# Patient Record
Sex: Male | Born: 1950 | Race: White | Hispanic: Yes | Marital: Married | Smoking: Current some day smoker
Health system: Southern US, Community
[De-identification: ages and names within clinical notes are randomized; demographics above are authoritative.]

## PROBLEM LIST (undated history)

## (undated) DIAGNOSIS — T783XXA Angioneurotic edema, initial encounter: Secondary | ICD-10-CM

## (undated) DIAGNOSIS — B029 Zoster without complications: Secondary | ICD-10-CM

## (undated) DIAGNOSIS — E119 Type 2 diabetes mellitus without complications: Secondary | ICD-10-CM

## (undated) DIAGNOSIS — Z9884 Bariatric surgery status: Secondary | ICD-10-CM

## (undated) DIAGNOSIS — I10 Essential (primary) hypertension: Secondary | ICD-10-CM

## (undated) DIAGNOSIS — R569 Unspecified convulsions: Secondary | ICD-10-CM

## (undated) DIAGNOSIS — N200 Calculus of kidney: Secondary | ICD-10-CM

## (undated) DIAGNOSIS — N481 Balanitis: Secondary | ICD-10-CM

## (undated) DIAGNOSIS — T7840XA Allergy, unspecified, initial encounter: Secondary | ICD-10-CM

## (undated) DIAGNOSIS — E785 Hyperlipidemia, unspecified: Secondary | ICD-10-CM

## (undated) DIAGNOSIS — K649 Unspecified hemorrhoids: Secondary | ICD-10-CM

## (undated) DIAGNOSIS — M1990 Unspecified osteoarthritis, unspecified site: Secondary | ICD-10-CM

## (undated) DIAGNOSIS — M353 Polymyalgia rheumatica: Secondary | ICD-10-CM

## (undated) DIAGNOSIS — G4733 Obstructive sleep apnea (adult) (pediatric): Secondary | ICD-10-CM

## (undated) HISTORY — DX: Essential (primary) hypertension: I10

## (undated) HISTORY — DX: Bariatric surgery status: Z98.84

## (undated) HISTORY — DX: Balanitis: N48.1

## (undated) HISTORY — DX: Unspecified hemorrhoids: K64.9

## (undated) HISTORY — DX: Allergy, unspecified, initial encounter: T78.40XA

## (undated) HISTORY — DX: Unspecified osteoarthritis, unspecified site: M19.90

## (undated) HISTORY — PX: APPENDECTOMY: SHX54

## (undated) HISTORY — DX: Polymyalgia rheumatica: M35.3

## (undated) HISTORY — DX: Zoster without complications: B02.9

## (undated) HISTORY — DX: Unspecified convulsions: R56.9

## (undated) HISTORY — DX: Calculus of kidney: N20.0

## (undated) HISTORY — DX: Hyperlipidemia, unspecified: E78.5

## (undated) HISTORY — DX: Type 2 diabetes mellitus without complications: E11.9

## (undated) HISTORY — DX: Angioneurotic edema, initial encounter: T78.3XXA

## (undated) HISTORY — DX: Obstructive sleep apnea (adult) (pediatric): G47.33

---
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
## 2001-07-18 ENCOUNTER — Encounter: Admission: RE | Admit: 2001-07-18 | Discharge: 2001-10-16 | Payer: Self-pay | Admitting: Internal Medicine

## 2001-10-19 ENCOUNTER — Ambulatory Visit (HOSPITAL_BASED_OUTPATIENT_CLINIC_OR_DEPARTMENT_OTHER): Admission: RE | Admit: 2001-10-19 | Discharge: 2001-10-19 | Payer: Self-pay | Admitting: Internal Medicine

## 2002-02-05 ENCOUNTER — Ambulatory Visit (HOSPITAL_COMMUNITY): Admission: RE | Admit: 2002-02-05 | Discharge: 2002-02-05 | Payer: Self-pay | Admitting: Gastroenterology

## 2004-01-29 IMAGING — CR DG CHEST 2V
2 series · 2 of 2 positions shown · non-contrast
Comparison: none

CLINICAL DATA: Obstructive sleep apnea.  Smoking history two cigars per day for 20 years with hypertension.  Preadmission.
 PA AND LATERAL CHEST:
 No old studies are available for comparison.
 The patient has made a poor inspiratory effort and taking this into consideration heart and mediastinal contours are within normal limits.  The lung fields are clear with no evidence for focal infiltrate or congestive failure.  Bony structures appear intact.
 IMPRESSION
 No acute cardiopulmonary disease.

[view not recorded (1 of 2)]
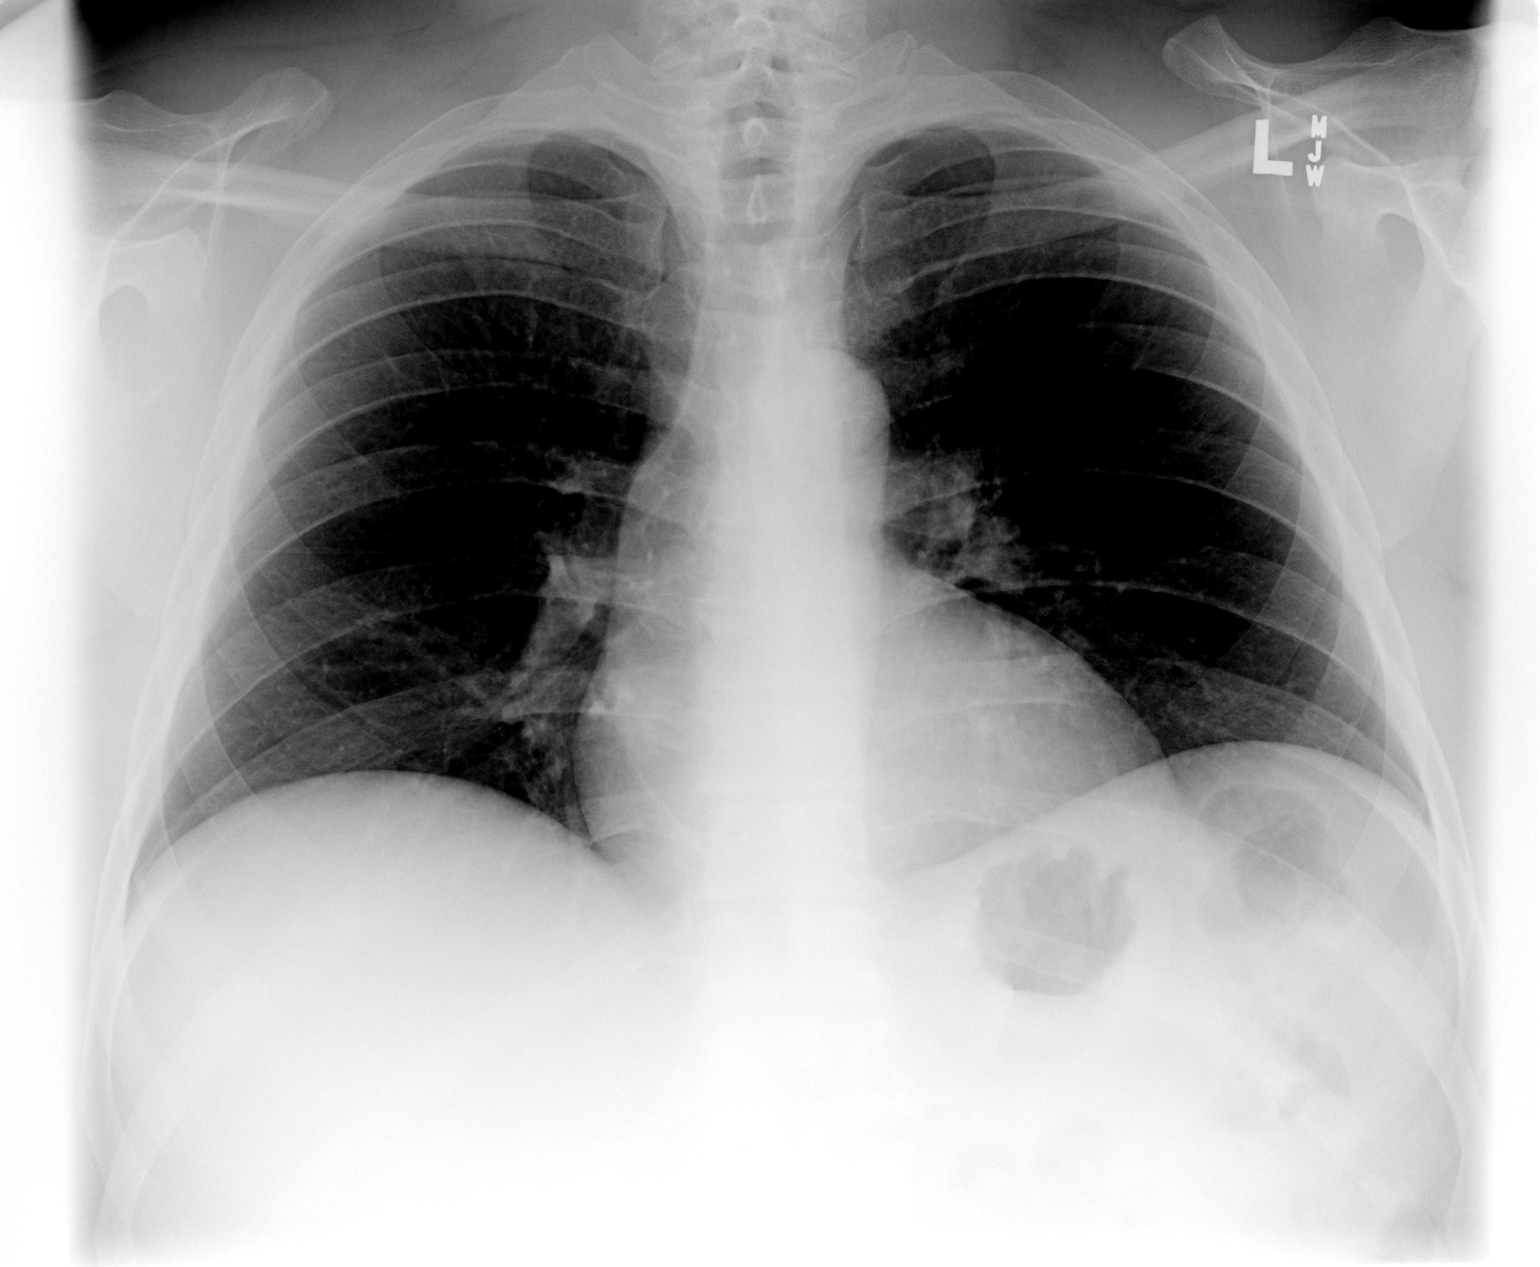

[view not recorded (2 of 2)]
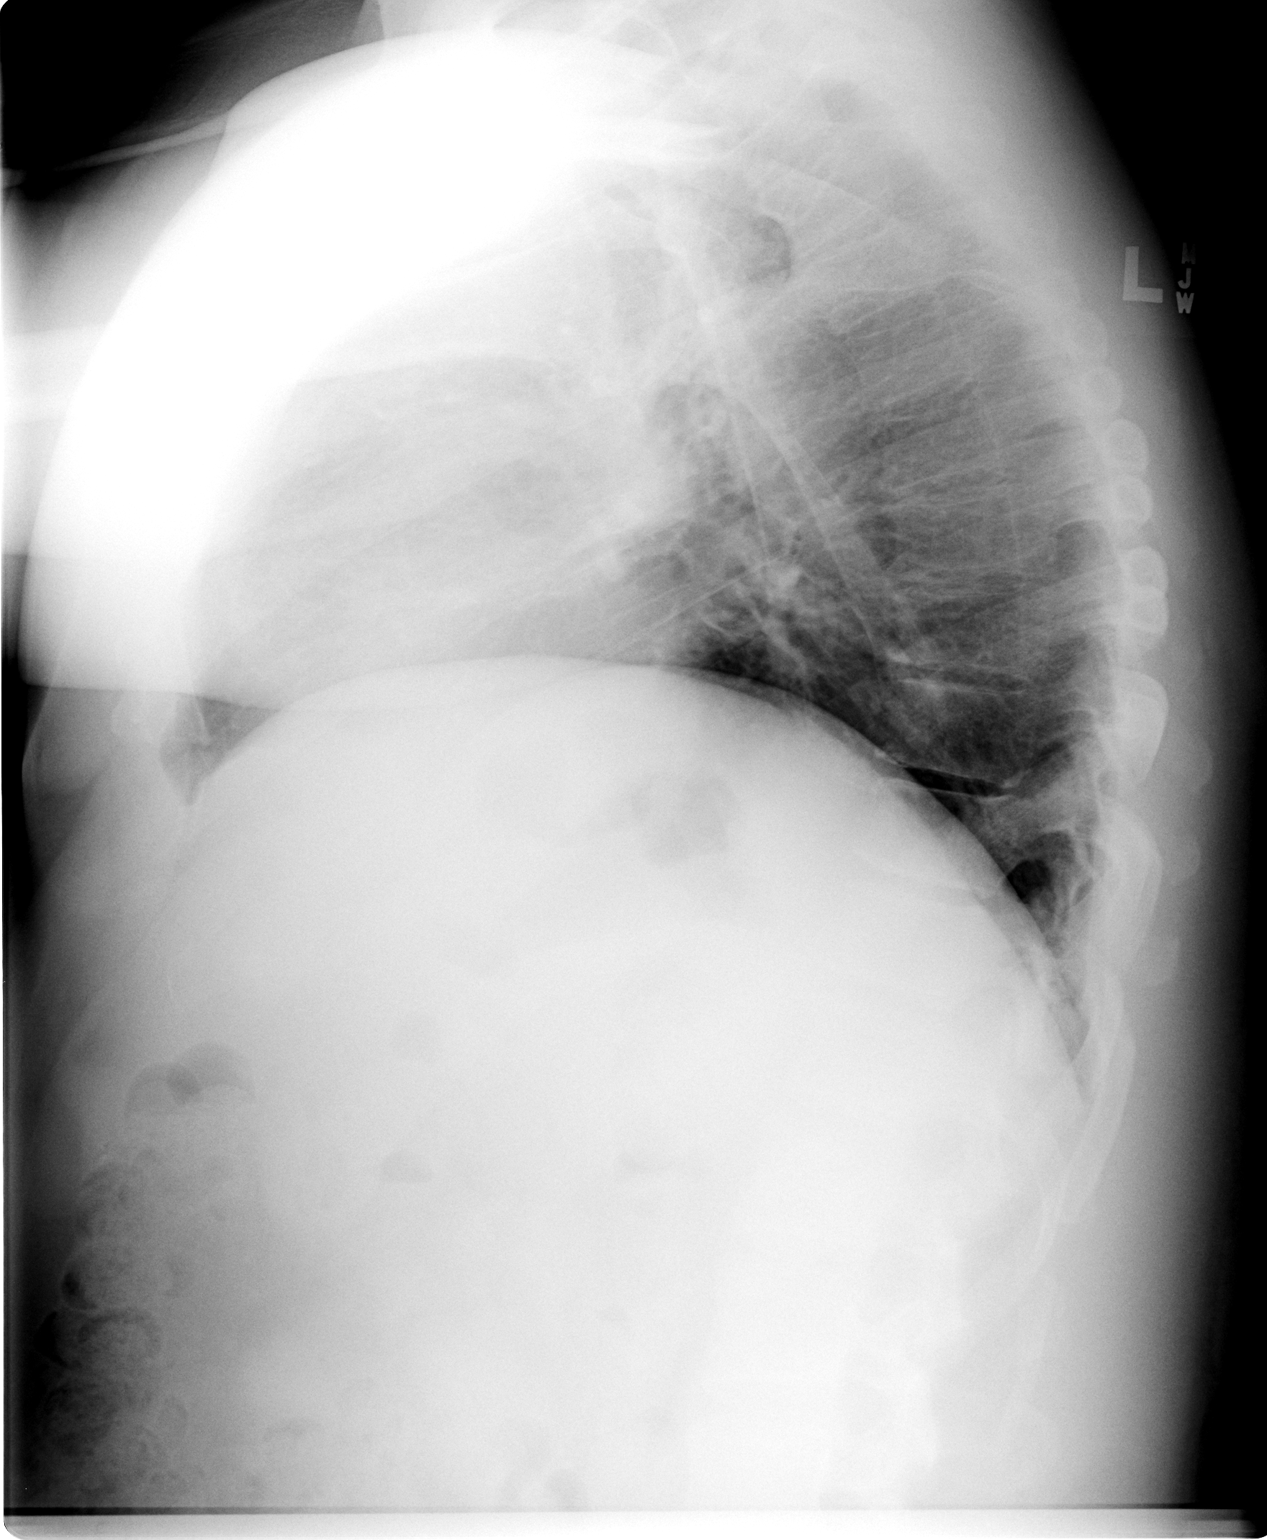

[2 of 2 positions shown; findings below may reference images not displayed]

## 2004-02-04 ENCOUNTER — Ambulatory Visit (HOSPITAL_COMMUNITY): Admission: RE | Admit: 2004-02-04 | Discharge: 2004-02-05 | Payer: Self-pay | Admitting: Otolaryngology

## 2004-08-11 ENCOUNTER — Ambulatory Visit: Payer: Self-pay | Admitting: Internal Medicine

## 2004-09-13 ENCOUNTER — Encounter: Admission: RE | Admit: 2004-09-13 | Discharge: 2004-12-12 | Payer: Self-pay | Admitting: Internal Medicine

## 2004-10-28 ENCOUNTER — Ambulatory Visit: Payer: Self-pay | Admitting: Family Medicine

## 2004-11-08 ENCOUNTER — Ambulatory Visit: Payer: Self-pay | Admitting: Internal Medicine

## 2005-06-21 ENCOUNTER — Ambulatory Visit: Payer: Self-pay | Admitting: Internal Medicine

## 2005-06-27 ENCOUNTER — Ambulatory Visit: Payer: Self-pay | Admitting: Internal Medicine

## 2005-07-01 ENCOUNTER — Ambulatory Visit: Payer: Self-pay

## 2006-06-19 ENCOUNTER — Encounter: Payer: Self-pay | Admitting: Internal Medicine

## 2006-06-28 ENCOUNTER — Ambulatory Visit: Payer: Self-pay | Admitting: Internal Medicine

## 2006-06-28 LAB — CONVERTED CEMR LAB: Hgb A1c MFr Bld: 7.4 % — ABNORMAL HIGH (ref 4.6–6.0)

## 2006-08-08 ENCOUNTER — Ambulatory Visit: Payer: Self-pay | Admitting: Internal Medicine

## 2006-08-21 ENCOUNTER — Encounter: Payer: Self-pay | Admitting: Internal Medicine

## 2006-09-26 ENCOUNTER — Ambulatory Visit: Payer: Self-pay | Admitting: Internal Medicine

## 2006-10-02 ENCOUNTER — Ambulatory Visit: Payer: Self-pay | Admitting: Internal Medicine

## 2006-10-25 ENCOUNTER — Encounter: Admission: RE | Admit: 2006-10-25 | Discharge: 2006-10-25 | Payer: Self-pay | Admitting: Internal Medicine

## 2006-11-15 ENCOUNTER — Ambulatory Visit: Payer: Self-pay | Admitting: Internal Medicine

## 2006-11-20 DIAGNOSIS — I152 Hypertension secondary to endocrine disorders: Secondary | ICD-10-CM | POA: Insufficient documentation

## 2006-11-20 DIAGNOSIS — E785 Hyperlipidemia, unspecified: Secondary | ICD-10-CM | POA: Insufficient documentation

## 2006-11-20 DIAGNOSIS — E119 Type 2 diabetes mellitus without complications: Secondary | ICD-10-CM | POA: Insufficient documentation

## 2006-11-20 DIAGNOSIS — E1159 Type 2 diabetes mellitus with other circulatory complications: Secondary | ICD-10-CM

## 2006-11-20 DIAGNOSIS — G4730 Sleep apnea, unspecified: Secondary | ICD-10-CM | POA: Insufficient documentation

## 2006-11-20 DIAGNOSIS — I10 Essential (primary) hypertension: Secondary | ICD-10-CM

## 2007-01-31 ENCOUNTER — Ambulatory Visit: Payer: Self-pay | Admitting: Internal Medicine

## 2007-02-20 ENCOUNTER — Encounter: Payer: Self-pay | Admitting: Internal Medicine

## 2007-02-27 ENCOUNTER — Ambulatory Visit: Payer: Self-pay | Admitting: Internal Medicine

## 2007-02-27 IMAGING — CR DG CHEST 2V
3 series · 3 of 3 positions shown · non-contrast
Comparison: none

CLINICAL DATA: Cough

Chest 2 view:
Comparison [DATE]. Relatively low lung volumes as before with subsegmental
atelectasis or conceivably infiltrate in the posterior right lower lobe. No
effusion. Heart size normal.

[view not recorded (1 of 3)]
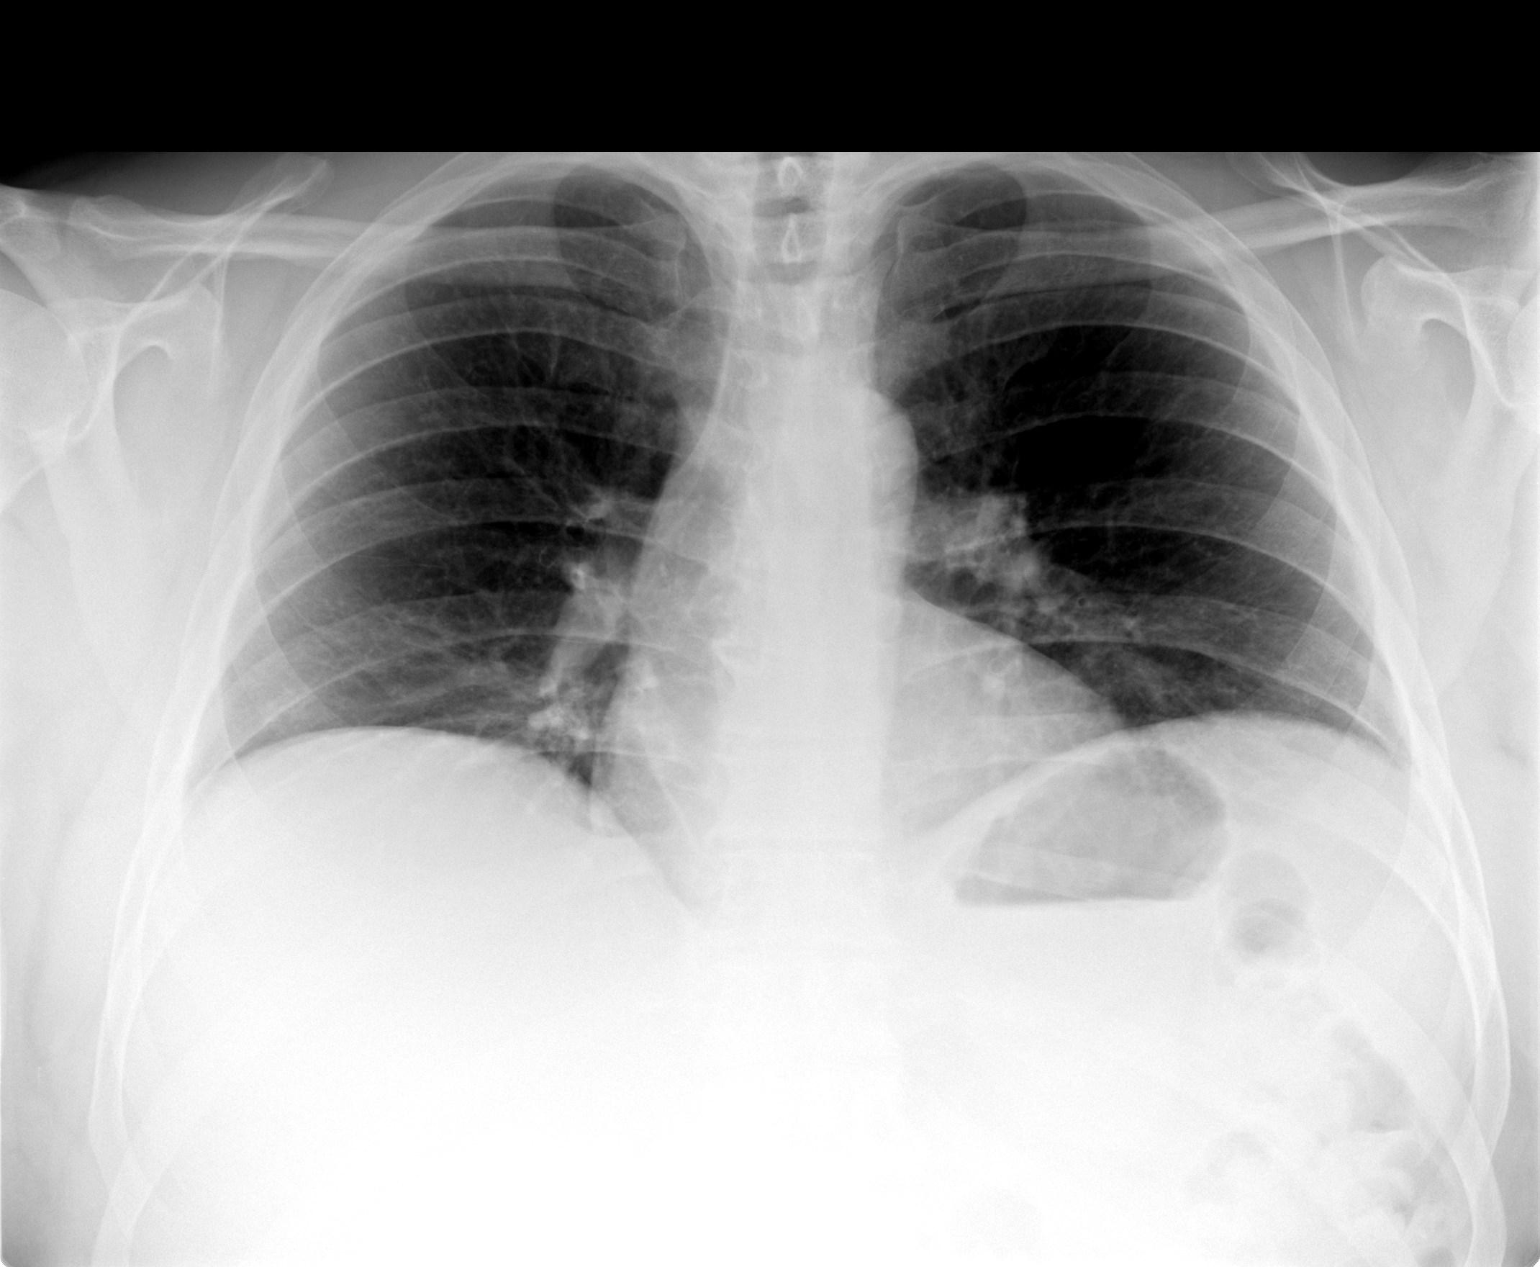

[view not recorded (2 of 3)]
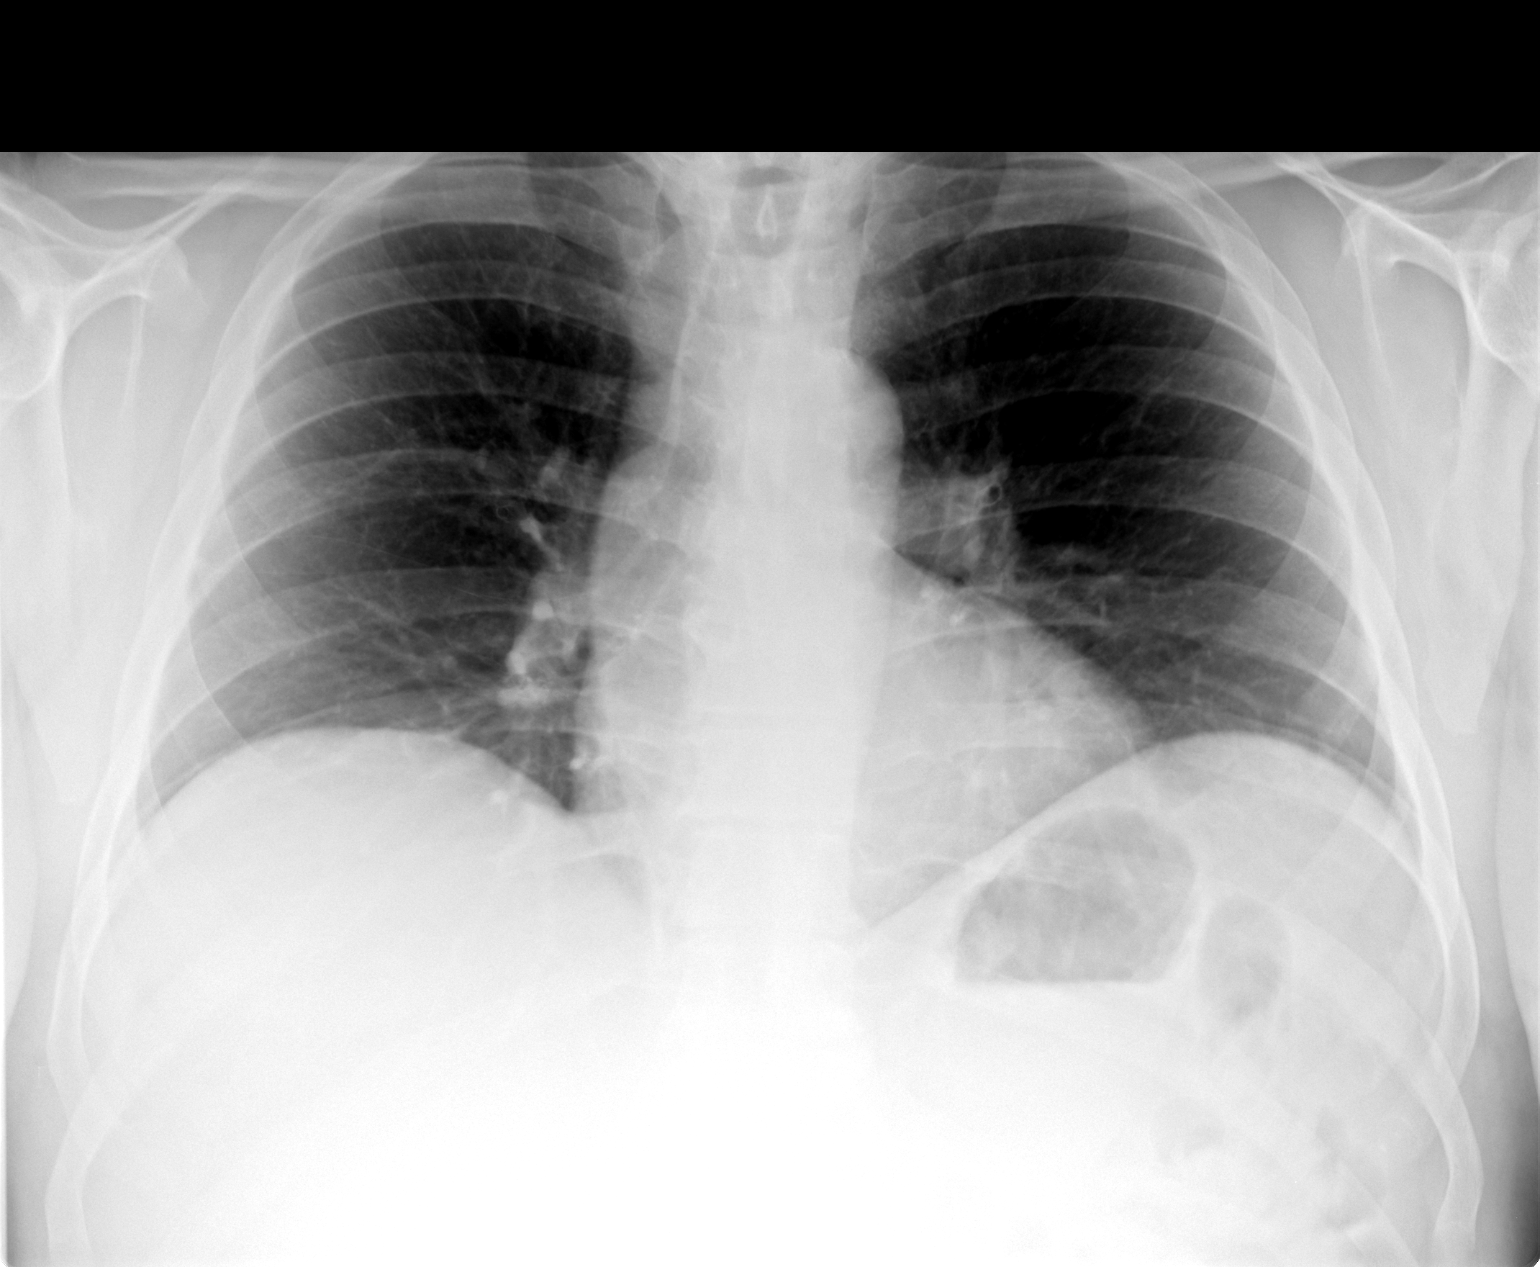

[view not recorded (3 of 3)]
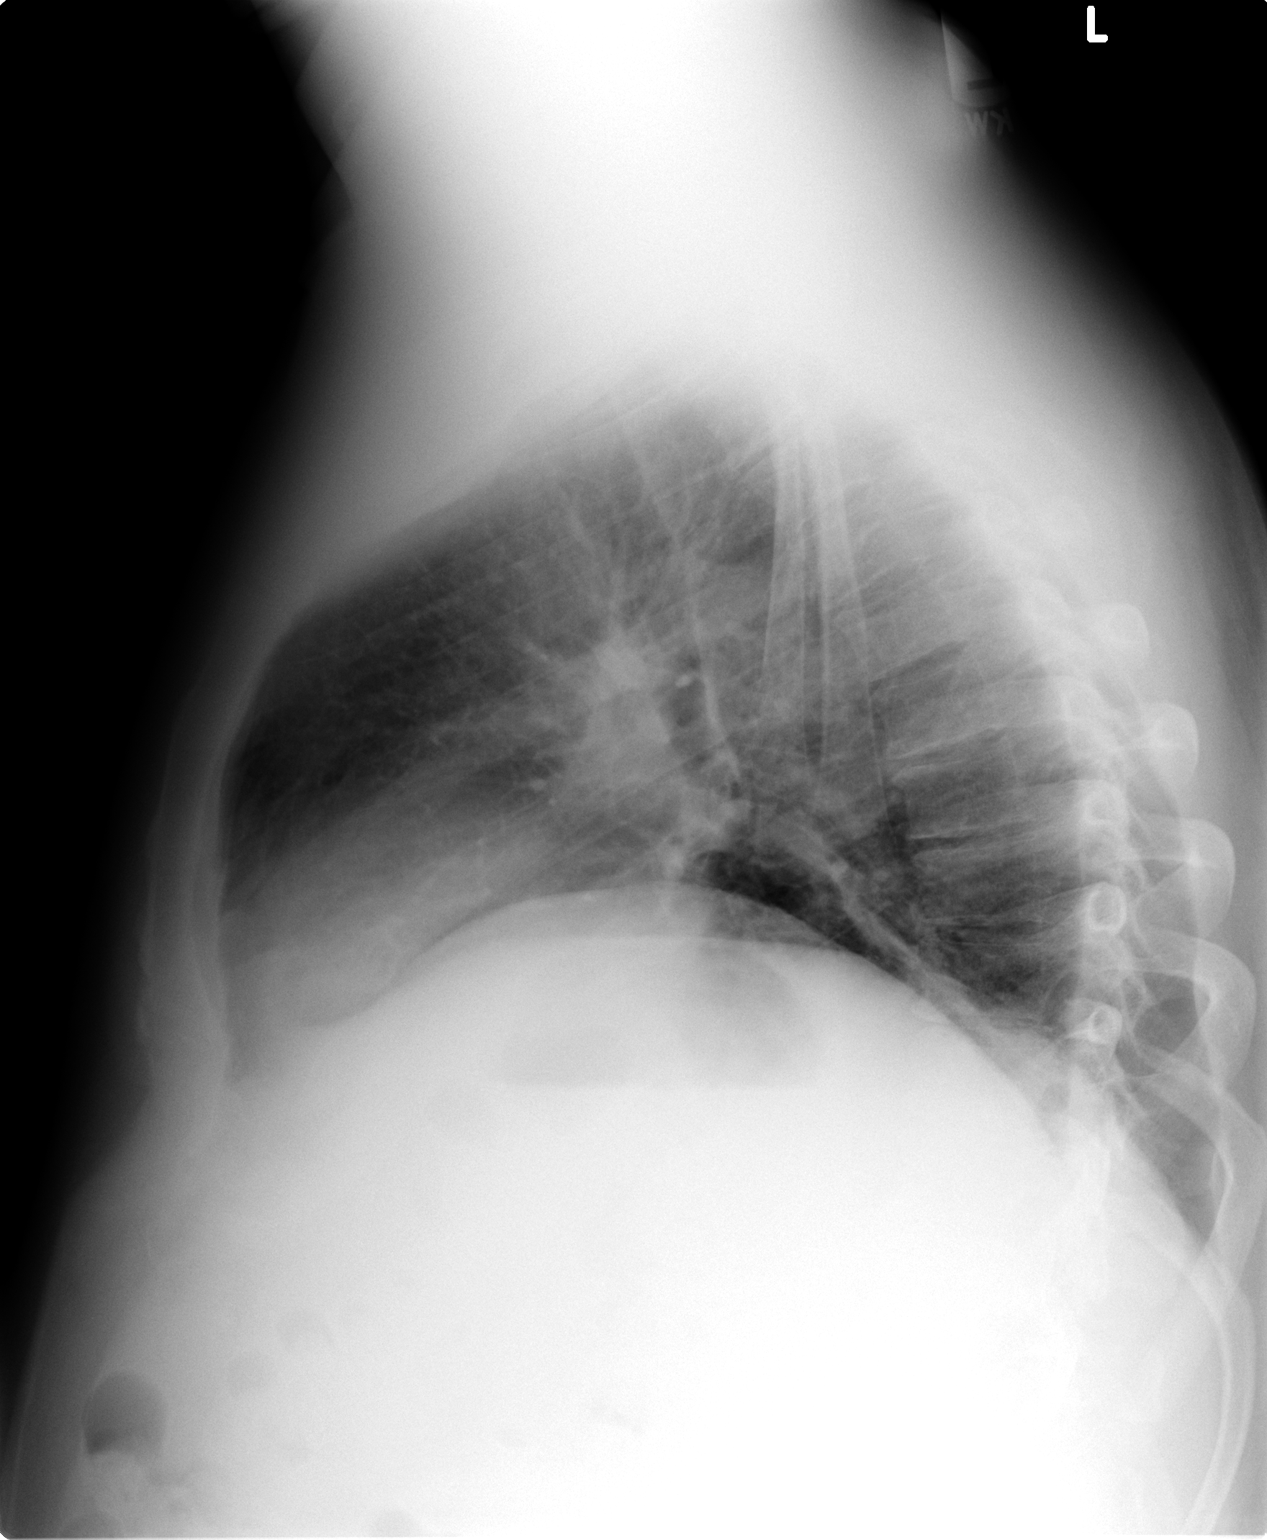

[3 of 3 positions shown; findings below may reference images not displayed]

IMPRESSION: 1. Low volumes with posterior right lower lobe infiltrate or atelectasis.

## 2007-04-03 ENCOUNTER — Encounter: Payer: Self-pay | Admitting: Internal Medicine

## 2007-06-11 ENCOUNTER — Ambulatory Visit: Payer: Self-pay | Admitting: Internal Medicine

## 2007-06-11 DIAGNOSIS — E669 Obesity, unspecified: Secondary | ICD-10-CM | POA: Insufficient documentation

## 2007-06-11 LAB — CONVERTED CEMR LAB
Albumin: 4.2 g/dL (ref 3.5–5.2)
Bilirubin, Direct: 0.1 mg/dL (ref 0.0–0.3)
Blood Glucose, Fingerstick: 198
CO2: 30 meq/L (ref 19–32)
Cholesterol, target level: 200 mg/dL
Creatinine,U: 139.4 mg/dL
HDL: 32.4 mg/dL — ABNORMAL LOW (ref >39.0)
LDL Goal: 100 mg/dL
Microalb Creat Ratio: 15.1 mg/g (ref 0.0–30.0)
Microalb, Ur: 2.1 mg/dL — ABNORMAL HIGH (ref 0.0–1.9)
Potassium: 4.7 meq/L (ref 3.5–5.1)
Sodium: 139 meq/L (ref 135–145)
Total Bilirubin: 0.8 mg/dL (ref 0.3–1.2)
Total Protein: 6.1 g/dL (ref 6.0–8.3)
VLDL: 24 mg/dL (ref 0–40)

## 2007-07-16 ENCOUNTER — Telehealth (INDEPENDENT_AMBULATORY_CARE_PROVIDER_SITE_OTHER): Payer: Self-pay | Admitting: *Deleted

## 2007-07-17 ENCOUNTER — Ambulatory Visit: Payer: Self-pay | Admitting: Internal Medicine

## 2007-07-18 ENCOUNTER — Telehealth (INDEPENDENT_AMBULATORY_CARE_PROVIDER_SITE_OTHER): Payer: Self-pay | Admitting: *Deleted

## 2007-12-20 ENCOUNTER — Ambulatory Visit: Payer: Self-pay | Admitting: Internal Medicine

## 2008-02-08 ENCOUNTER — Telehealth: Payer: Self-pay | Admitting: Internal Medicine

## 2008-02-29 ENCOUNTER — Ambulatory Visit: Payer: Self-pay | Admitting: Internal Medicine

## 2008-02-29 LAB — CONVERTED CEMR LAB
AST: 29 units/L (ref 0–37)
Bilirubin, Direct: 0.3 mg/dL (ref 0.0–0.3)
CO2: 32 meq/L (ref 19–32)
Calcium: 9.3 mg/dL (ref 8.4–10.5)
Chloride: 105 meq/L (ref 96–112)
Glucose, Bld: 159 mg/dL — ABNORMAL HIGH (ref 70–99)
Hgb A1c MFr Bld: 7 % — ABNORMAL HIGH (ref 4.6–6.0)
Potassium: 4.4 meq/L (ref 3.5–5.1)
Total CHOL/HDL Ratio: 3.5
Total Protein: 6.3 g/dL (ref 6.0–8.3)
Triglycerides: 79 mg/dL (ref 0–149)

## 2008-04-30 ENCOUNTER — Ambulatory Visit: Payer: Self-pay | Admitting: Internal Medicine

## 2008-06-09 ENCOUNTER — Telehealth (INDEPENDENT_AMBULATORY_CARE_PROVIDER_SITE_OTHER): Payer: Self-pay | Admitting: *Deleted

## 2008-06-19 ENCOUNTER — Encounter: Payer: Self-pay | Admitting: Internal Medicine

## 2008-07-08 ENCOUNTER — Encounter: Payer: Self-pay | Admitting: Internal Medicine

## 2008-07-15 ENCOUNTER — Ambulatory Visit: Payer: Self-pay | Admitting: Internal Medicine

## 2008-07-15 LAB — CONVERTED CEMR LAB
AST: 21 units/L (ref 0–37)
Albumin: 4.1 g/dL (ref 3.5–5.2)
BUN: 15 mg/dL (ref 6–23)
Bilirubin, Direct: 0.1 mg/dL (ref 0.0–0.3)
Chloride: 104 meq/L (ref 96–112)
Cholesterol: 135 mg/dL (ref 0–200)
GFR calc non Af Amer: 107 mL/min
Glucose, Bld: 156 mg/dL — ABNORMAL HIGH (ref 70–99)
Potassium: 4.5 meq/L (ref 3.5–5.1)
Sodium: 144 meq/L (ref 135–145)
Total Protein: 6.2 g/dL (ref 6.0–8.3)

## 2008-07-29 ENCOUNTER — Ambulatory Visit: Payer: Self-pay | Admitting: Internal Medicine

## 2008-08-25 ENCOUNTER — Telehealth (INDEPENDENT_AMBULATORY_CARE_PROVIDER_SITE_OTHER): Payer: Self-pay | Admitting: *Deleted

## 2008-09-24 ENCOUNTER — Encounter: Payer: Self-pay | Admitting: Internal Medicine

## 2008-09-30 ENCOUNTER — Encounter: Payer: Self-pay | Admitting: Internal Medicine

## 2008-10-09 ENCOUNTER — Encounter: Payer: Self-pay | Admitting: Internal Medicine

## 2008-11-05 ENCOUNTER — Ambulatory Visit (HOSPITAL_COMMUNITY): Admission: RE | Admit: 2008-11-05 | Discharge: 2008-11-05 | Payer: Self-pay | Admitting: Surgery

## 2008-11-05 IMAGING — CR DG UGI W/ KUB
2 series · 2 of 2 positions shown · non-contrast
Comparison: none

CLINICAL DATA: Morbid obesity.  Preop evaluation for bariatric
surgery.

UPPER GI SERIES WITH KUB
TECHNIQUE: After obtaining a scout radiograph a single-column
upper GI series was performed using thin barium.
Fluoroscopy time: 1.8 minutes

[view not recorded (1 of 2)]
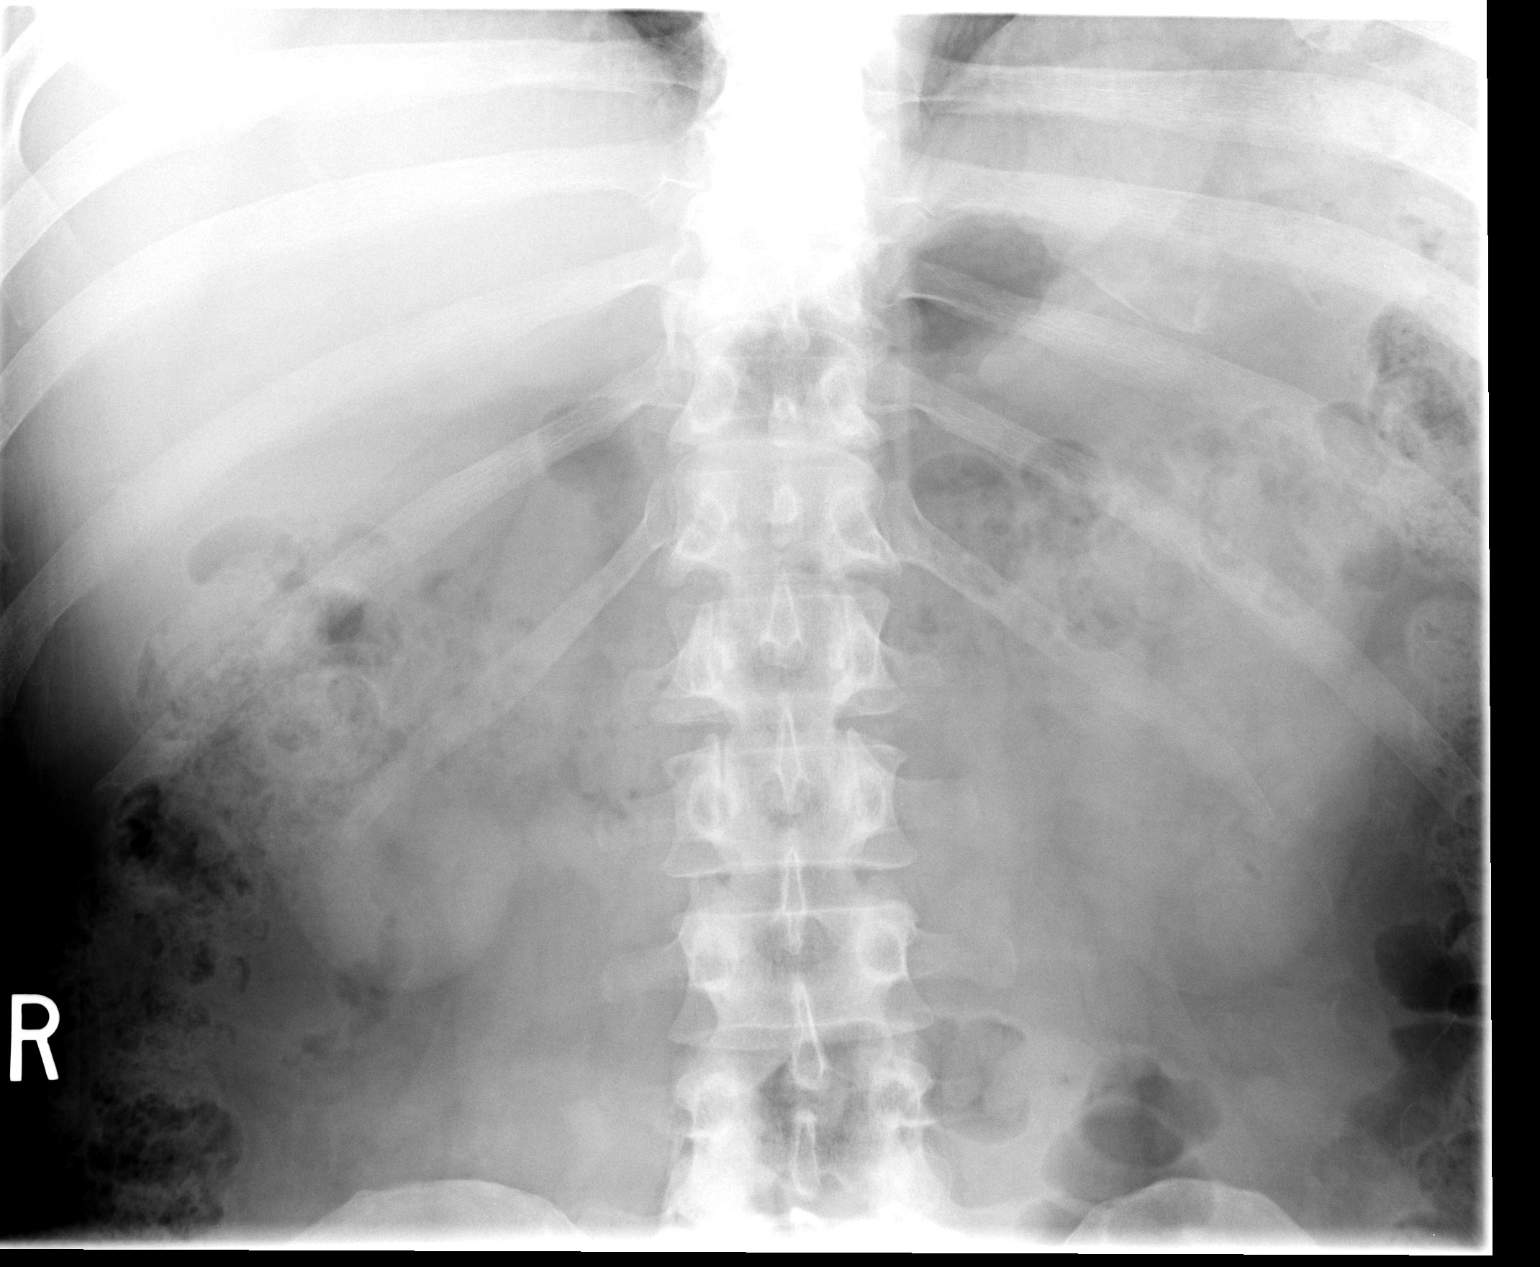

[view not recorded (2 of 2)]
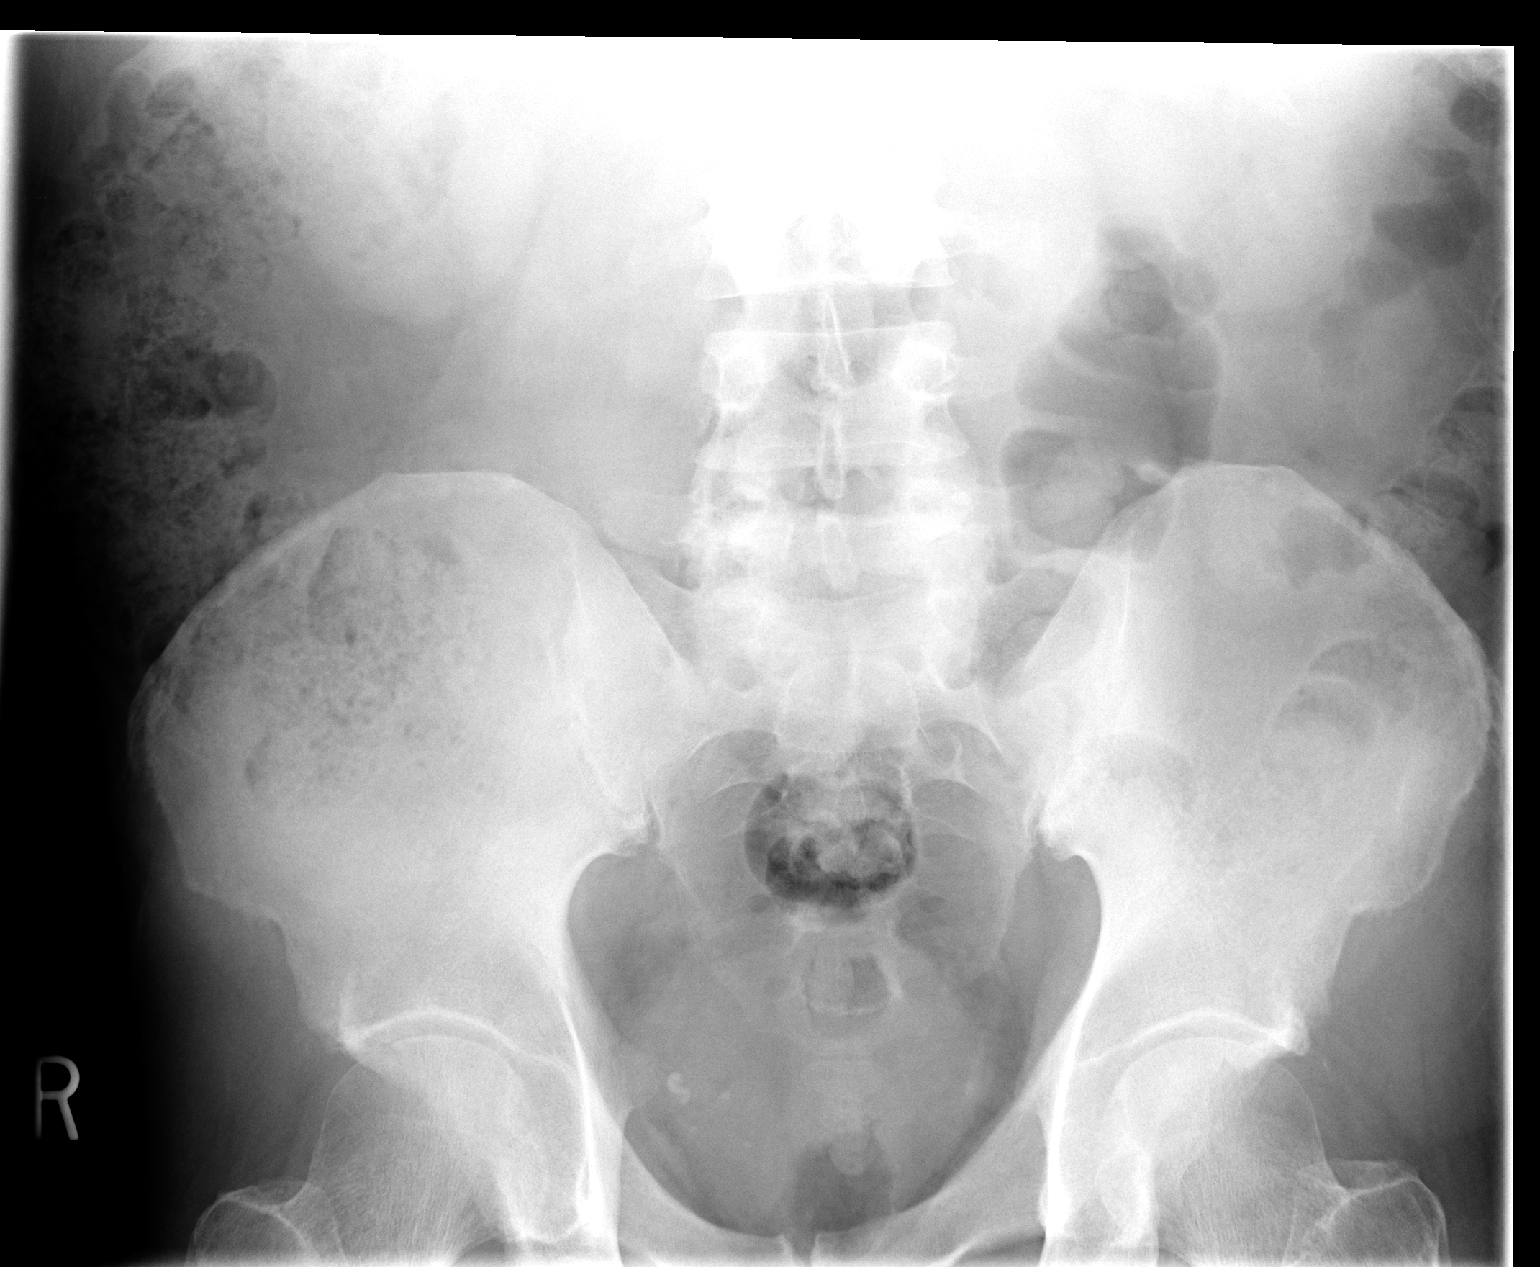

[2 of 2 positions shown; findings below may reference images not displayed]

FINDINGS: The scout radiograph shows a normal bowel gas pattern.

There is no evidence of esophageal mass or stricture.  There is no
evidence of hiatal hernia, and no gastroesophageal reflux was seen
during the exam.  Esophageal motility is within normal limits.

The stomach is normal in appearance.  There is no evidence of
gastric masses or ulcers.  Duodenal bulb and sweep are normal in
appearance.
IMPRESSION: Negative UGI series.

## 2008-11-05 IMAGING — RF DG UGI W/ KUB
16 series · 16 of 16 positions shown · non-contrast
Comparison: none

CLINICAL DATA: Morbid obesity.  Preop evaluation for bariatric
surgery.

UPPER GI SERIES WITH KUB
TECHNIQUE: After obtaining a scout radiograph a single-column
upper GI series was performed using thin barium.
Fluoroscopy time: 1.8 minutes

[Series 1: run · 1 of 1 slices shown (1 of 16)]
[im 1/1]
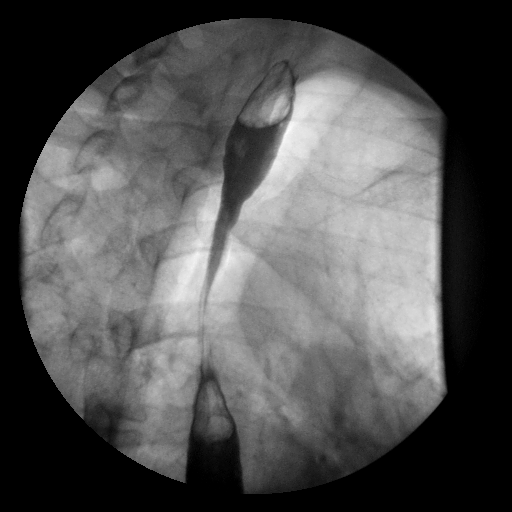

[Series 2: run · 1 of 1 slices shown (2 of 16)]
[im 1/1]
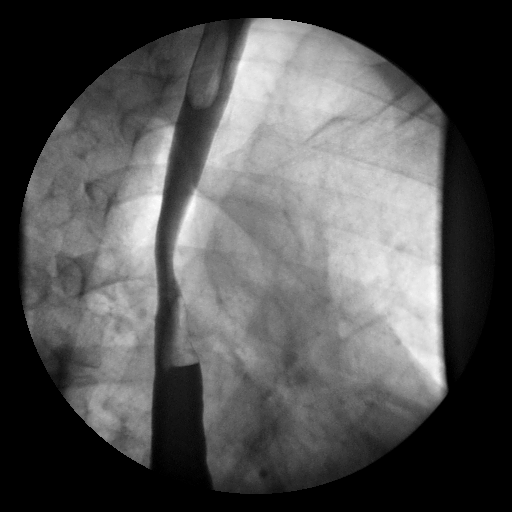

[Series 3: run · 1 of 1 slices shown (3 of 16)]
[im 1/1]
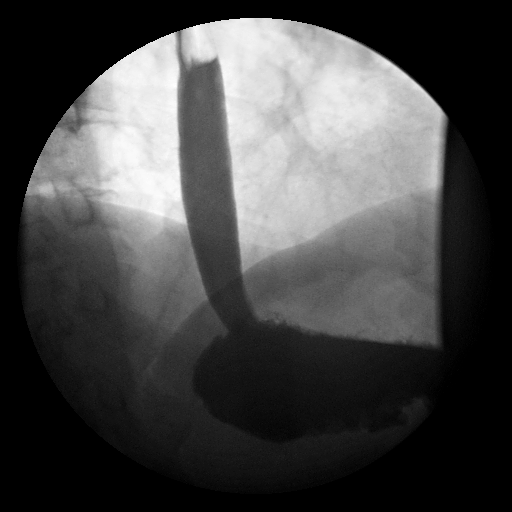

[Series 4: run · 1 of 1 slices shown (4 of 16)]
[im 1/1]
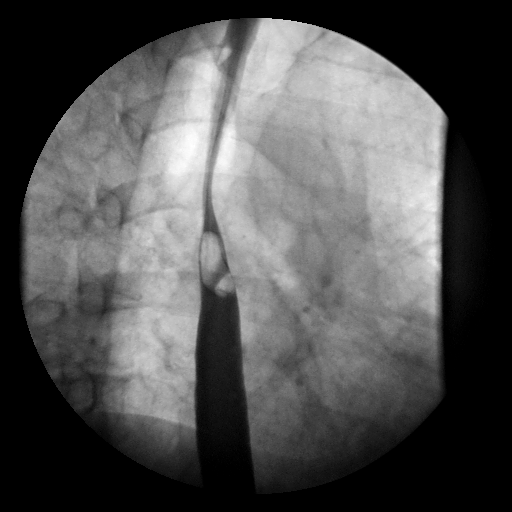

[Series 5: run · 1 of 1 slices shown (5 of 16)]
[im 1/1]
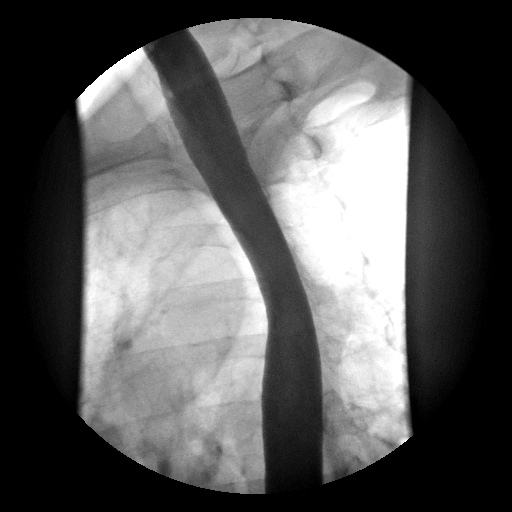

[Series 6: run · 1 of 1 slices shown (6 of 16)]
[im 1/1]
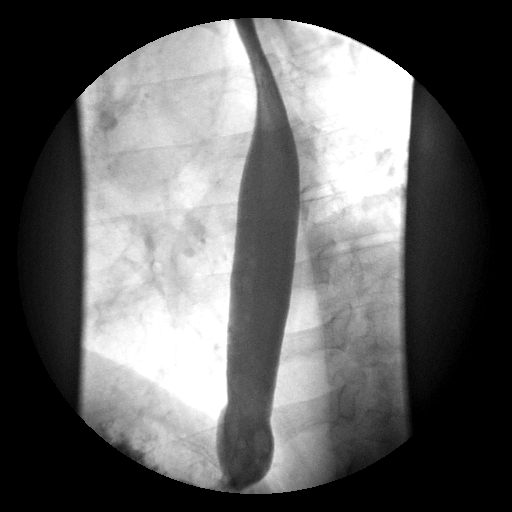

[Series 7: run · 1 of 1 slices shown (7 of 16)]
[im 1/1]
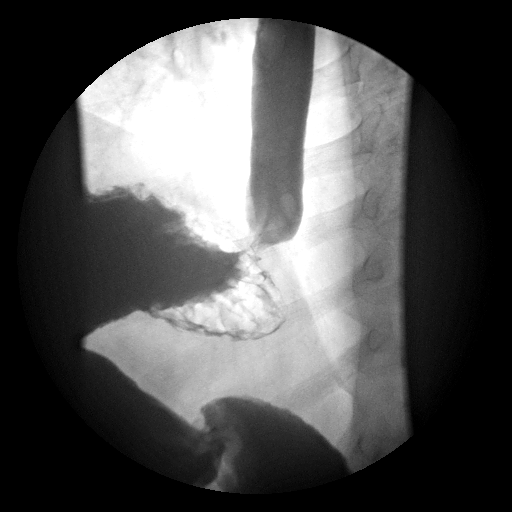

[Series 8: run · 1 of 1 slices shown (8 of 16)]
[im 1/1]
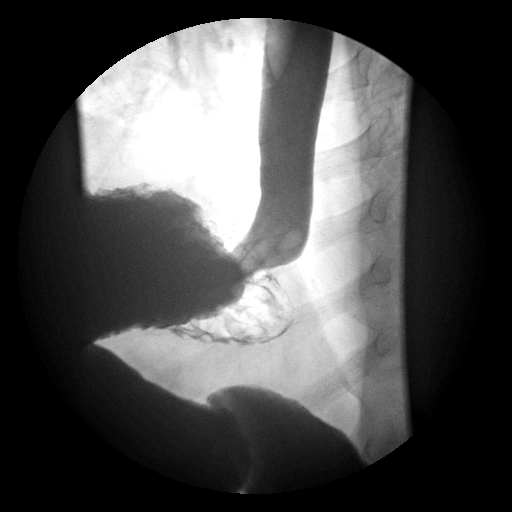

[Series 9: run · 1 of 1 slices shown (9 of 16)]
[im 1/1]
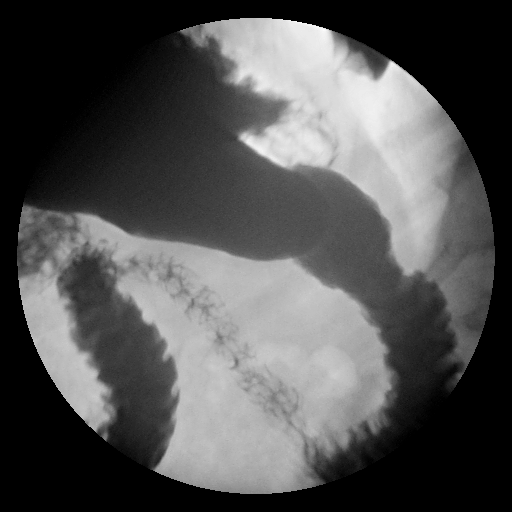

[Series 10: run · 1 of 1 slices shown (10 of 16)]
[im 1/1]
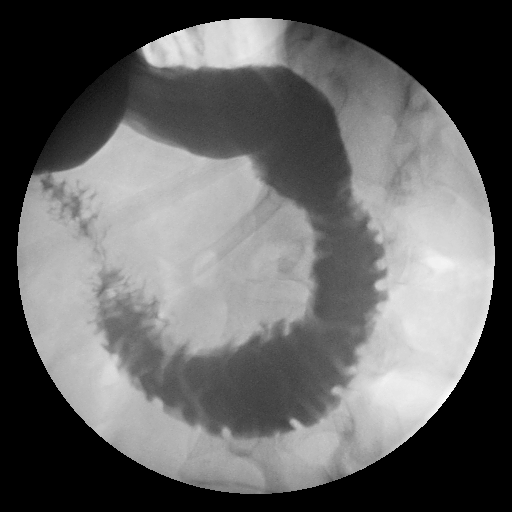

[Series 11: run · 1 of 1 slices shown (11 of 16)]
[im 1/1]
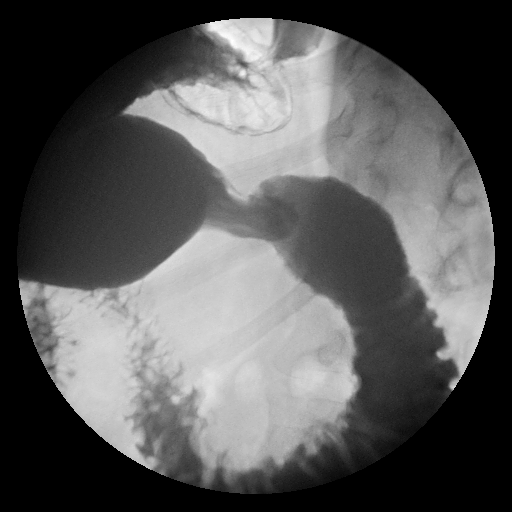

[Series 12: run · 1 of 1 slices shown (12 of 16)]
[im 1/1]
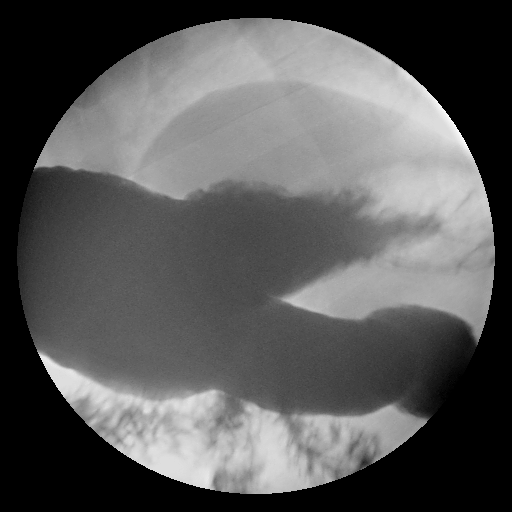

[Series 13: run · 1 of 1 slices shown (13 of 16)]
[im 1/1]
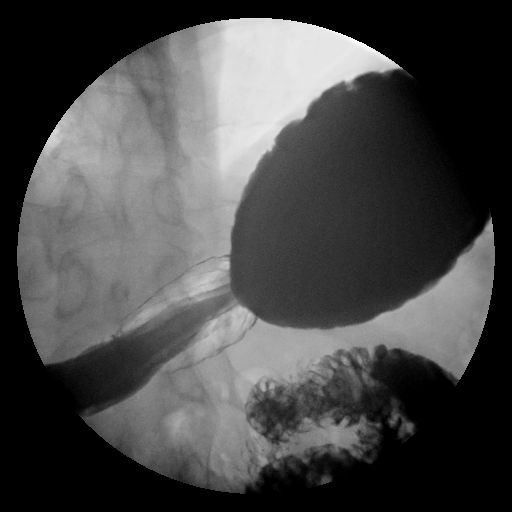

[Series 14: run · 1 of 1 slices shown (14 of 16)]
[im 1/1]
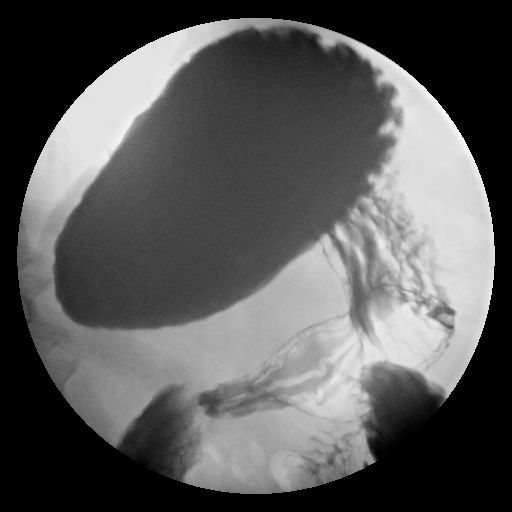

[Series 15: run · 1 of 1 slices shown (15 of 16)]
[im 1/1]
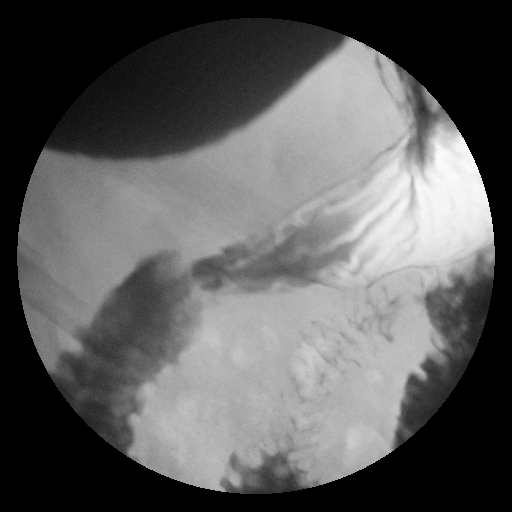

[Series 16: run · 1 of 1 slices shown (16 of 16)]
[im 1/1]
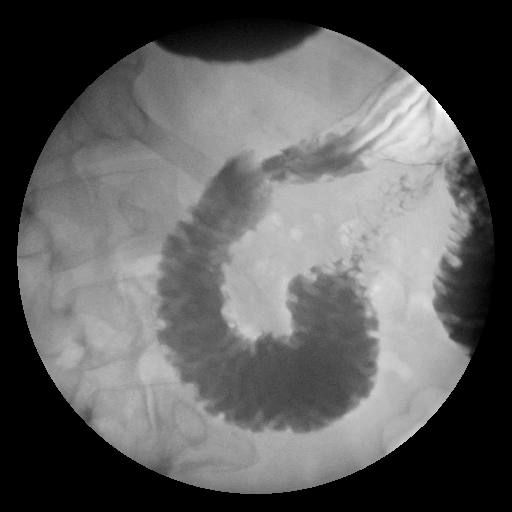

[16 of 16 positions shown; findings below may reference images not displayed]

FINDINGS: The scout radiograph shows a normal bowel gas pattern.

There is no evidence of esophageal mass or stricture.  There is no
evidence of hiatal hernia, and no gastroesophageal reflux was seen
during the exam.  Esophageal motility is within normal limits.

The stomach is normal in appearance.  There is no evidence of
gastric masses or ulcers.  Duodenal bulb and sweep are normal in
appearance.
IMPRESSION: Negative UGI series.

## 2008-11-05 IMAGING — US US ABDOMEN COMPLETE
1 series · 14 of 25 positions shown · non-contrast
Comparison: None

CLINICAL DATA: Morbid obesity.  Preop evaluation for bariatric
surgery.

ABDOMINAL ULTRASOUND COMPLETE

[Series 1: us abdomen complete · 14 of 68 slices shown]
[im 1/68]
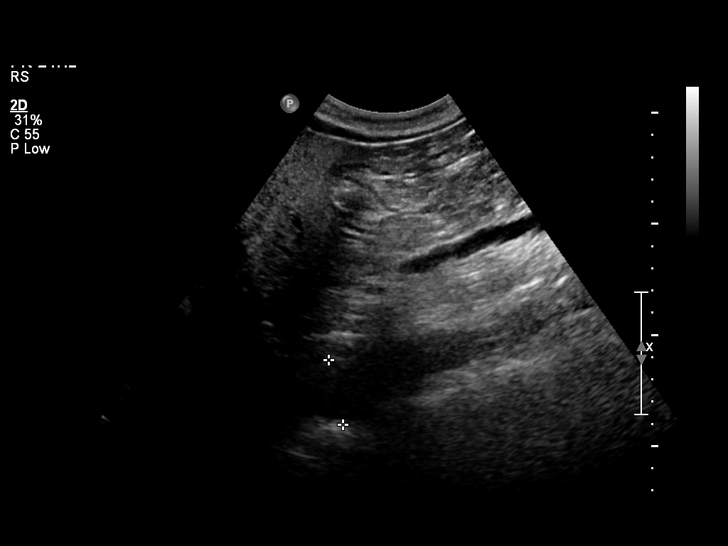
[im 6/68]
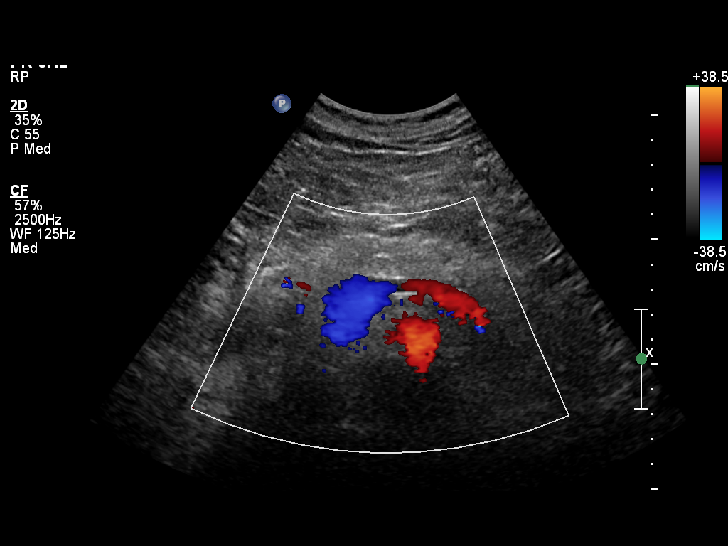
[im 12/68]
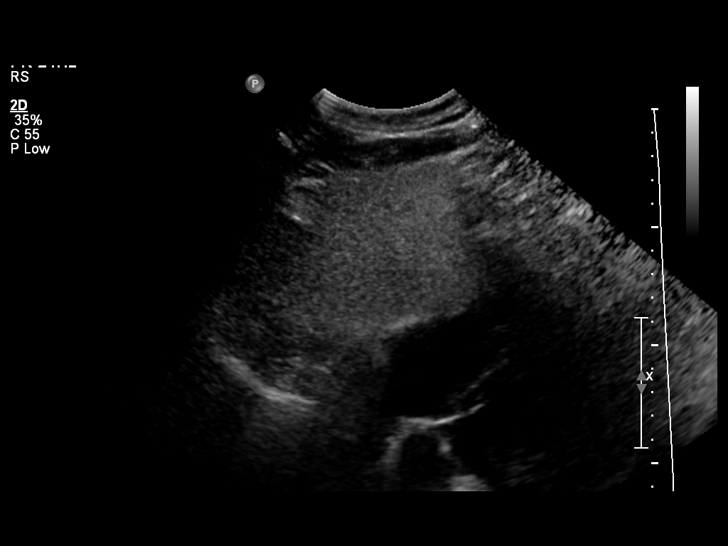
[im 17/68]
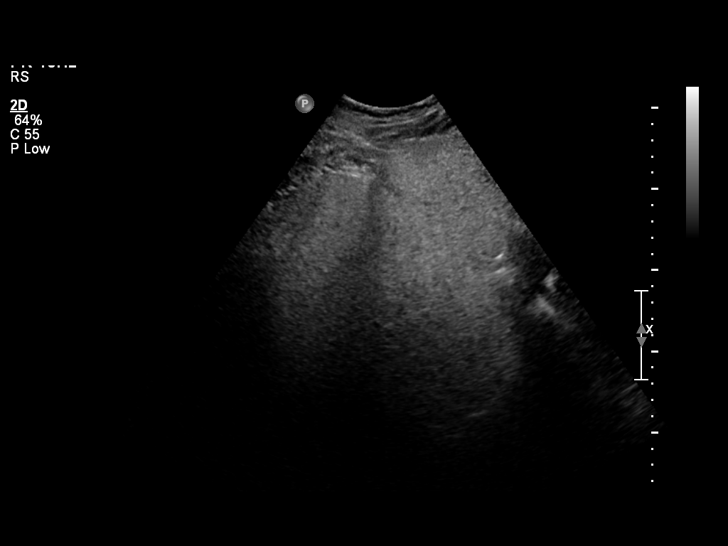
[im 23/68]
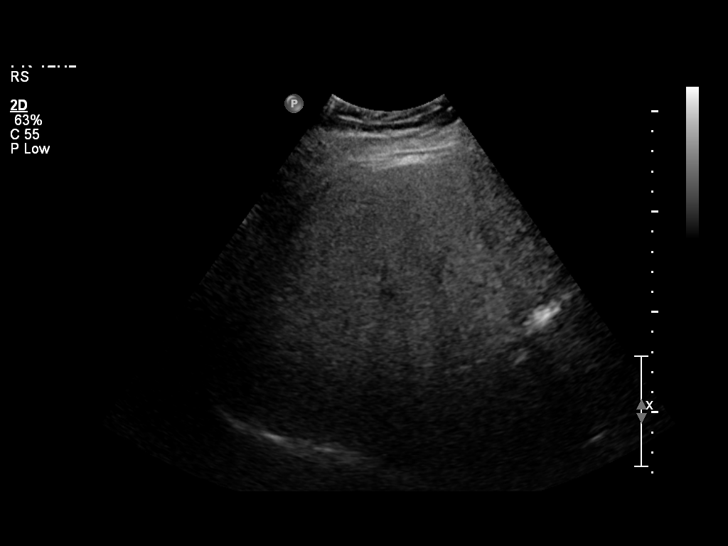
[im 26/68]
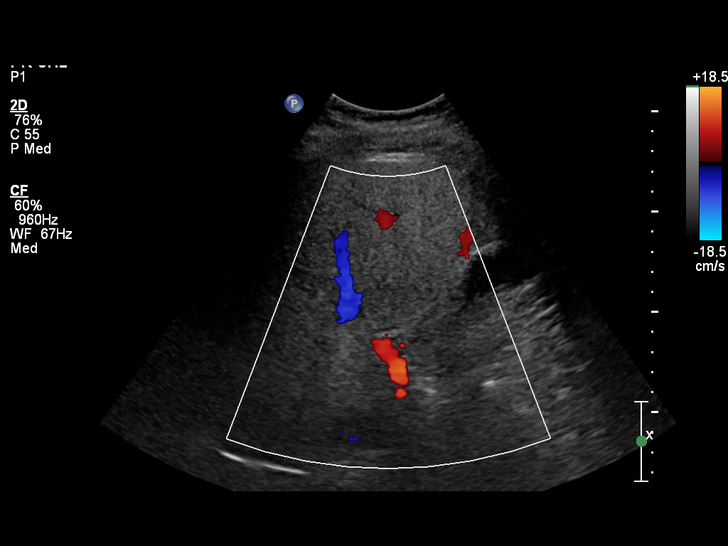
[im 31/68]
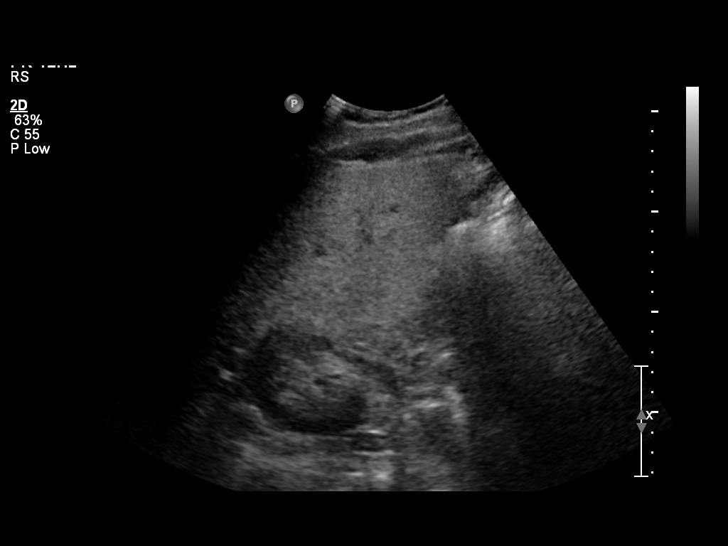
[im 37/68]
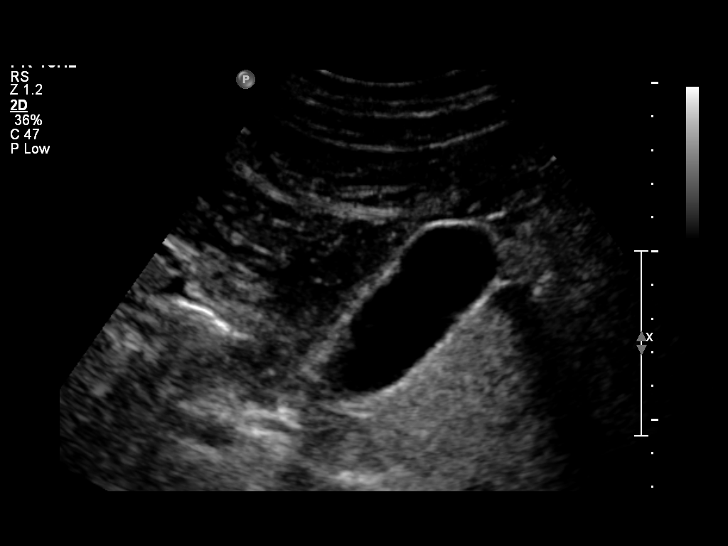
[im 42/68]
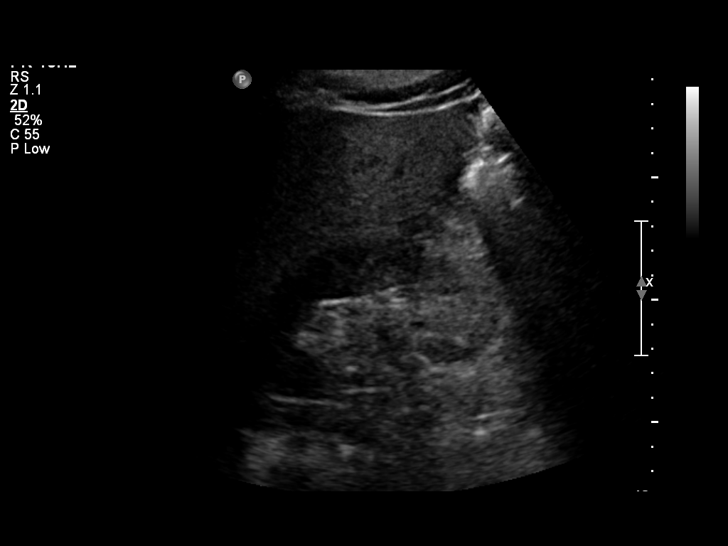
[im 45/68]
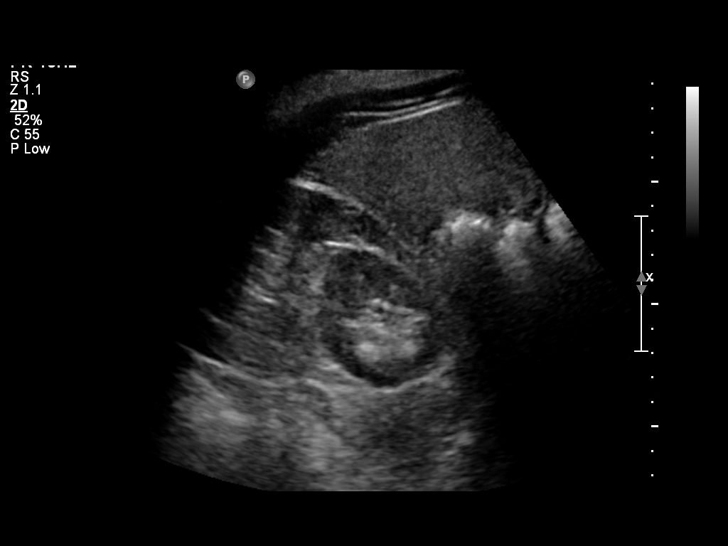
[im 51/68]
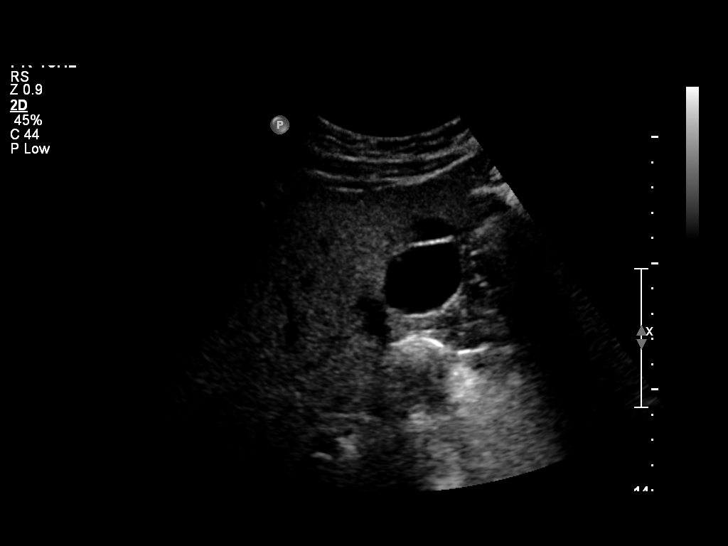
[im 56/68]
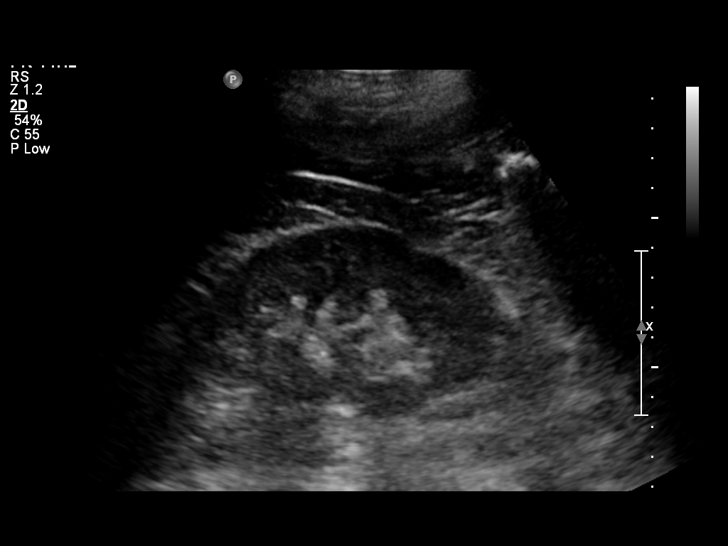
[im 62/68]
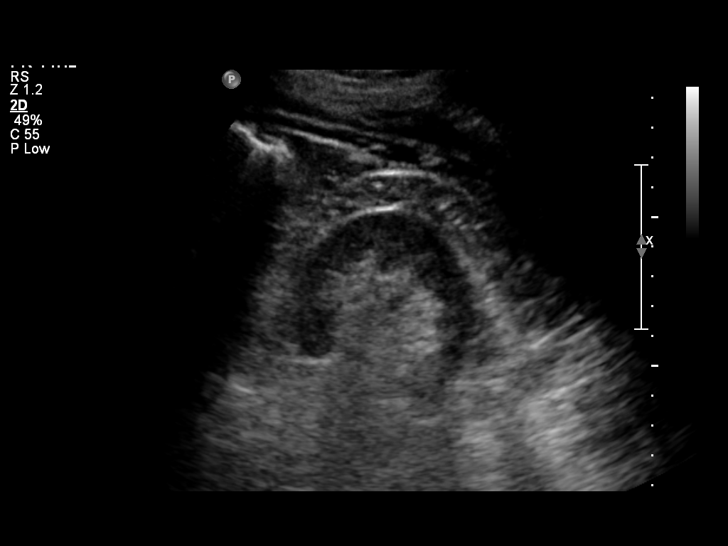
[im 68/68]
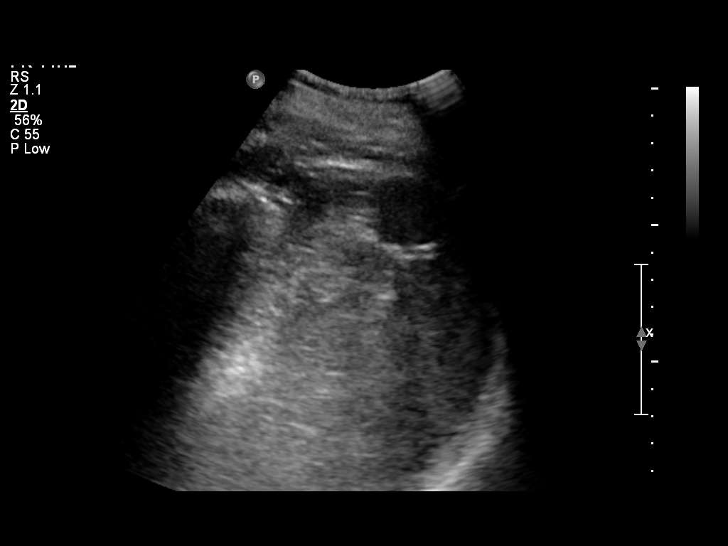

[14 of 25 positions shown; findings below may reference images not displayed]

FINDINGS: Gallbladder:  No gallstones, gallbladder wall thickening, or
pericholecystic fluid.

Common Bile Duct:  Within normal limits in caliber.

Liver:  No focal lesion identified.  Diffusely increased
parenchymal echogenicity, consistent with diffuse fatty
infiltration.

IVC:  Appears normal.

Pancreas:  Although the pancreas is difficult to visualize in its
entirety, no focal pancreatic abnormality is identified.

Spleen:  Within normal limits in size and echotexture.

Right kidney: Normal in size and parenchymal echogenicity.  No
evidence of mass or hydronephrosis.

Left kidney: Normal in size and parenchymal echogenicity.  No
evidence of mass or hydronephrosis.

Abdominal Aorta:  No aneurysm identified.
IMPRESSION: 1.  No evidence of gallstones or biliary ductal dilatation.
2.  Diffuse fatty infiltration of the liver.

## 2008-11-05 IMAGING — CR DG CHEST 2V
2 series · 2 of 2 positions shown · non-contrast
Comparison: [DATE]

CLINICAL DATA: Preoperative evaluation.

CHEST - 2 VIEW

[view not recorded (1 of 2)]
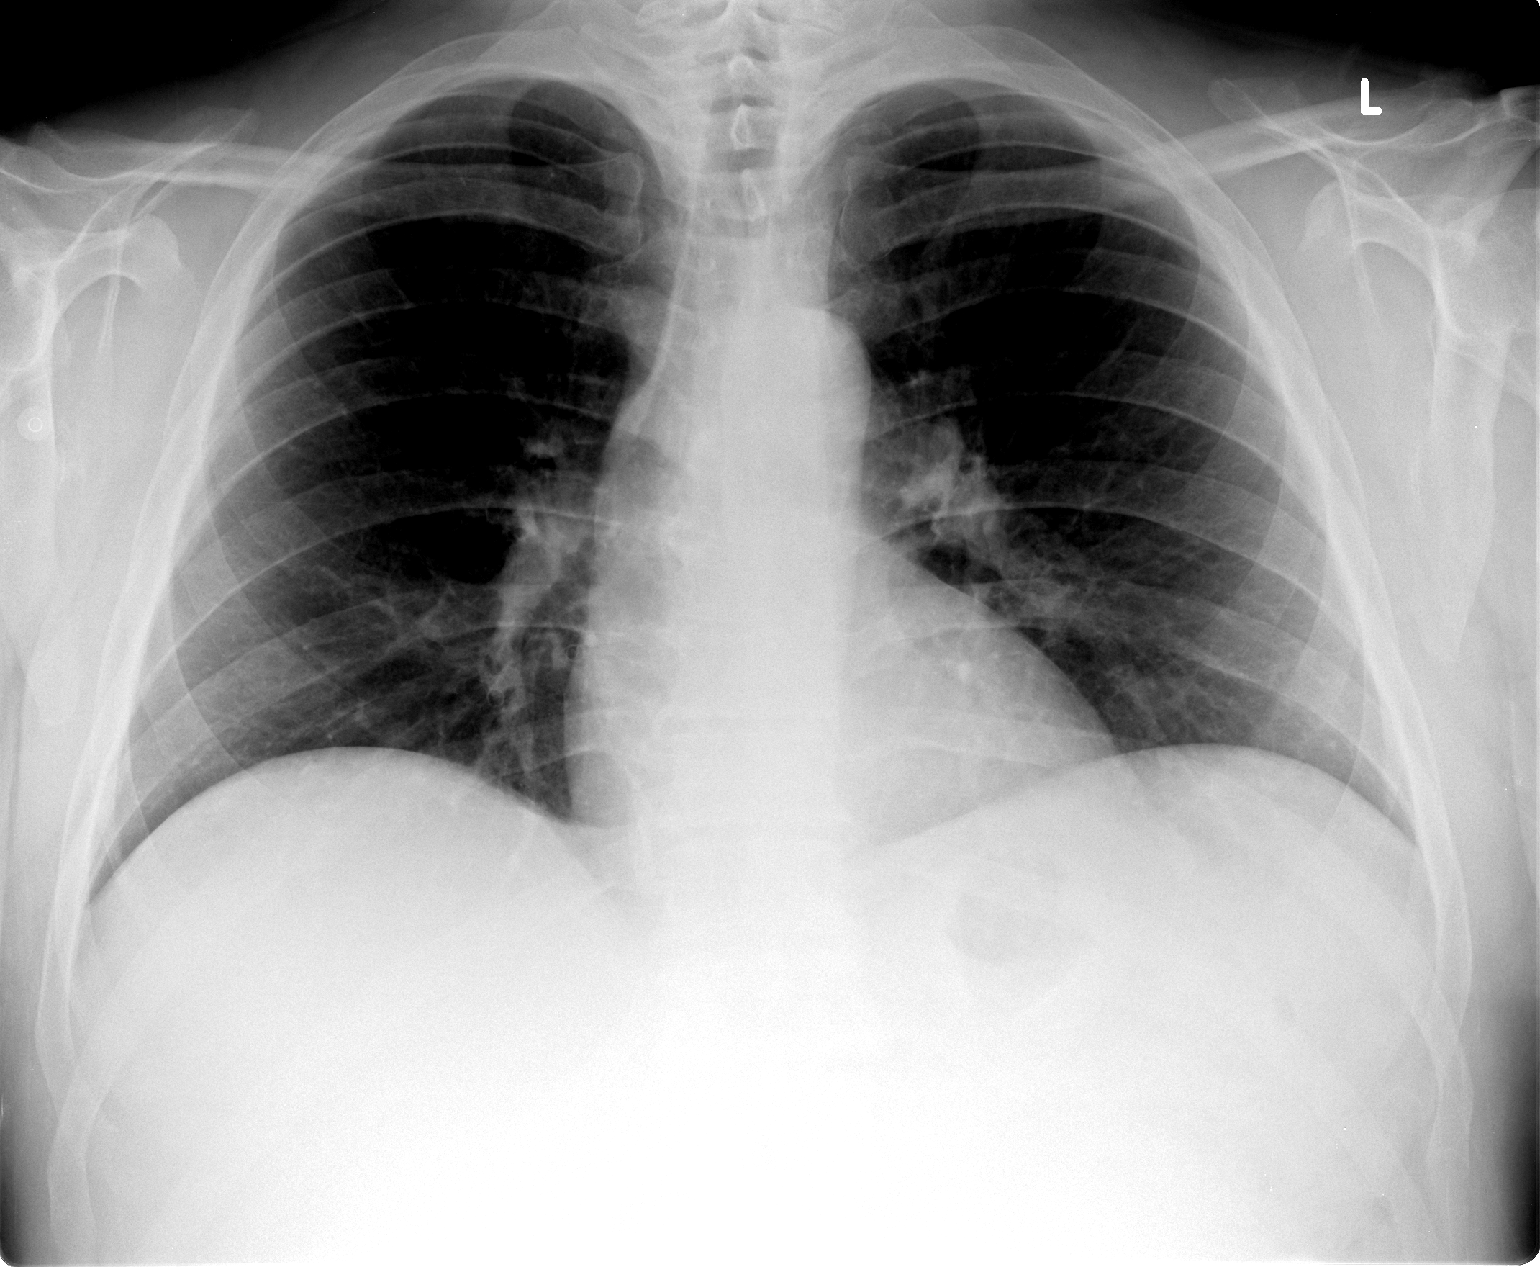

[view not recorded (2 of 2)]
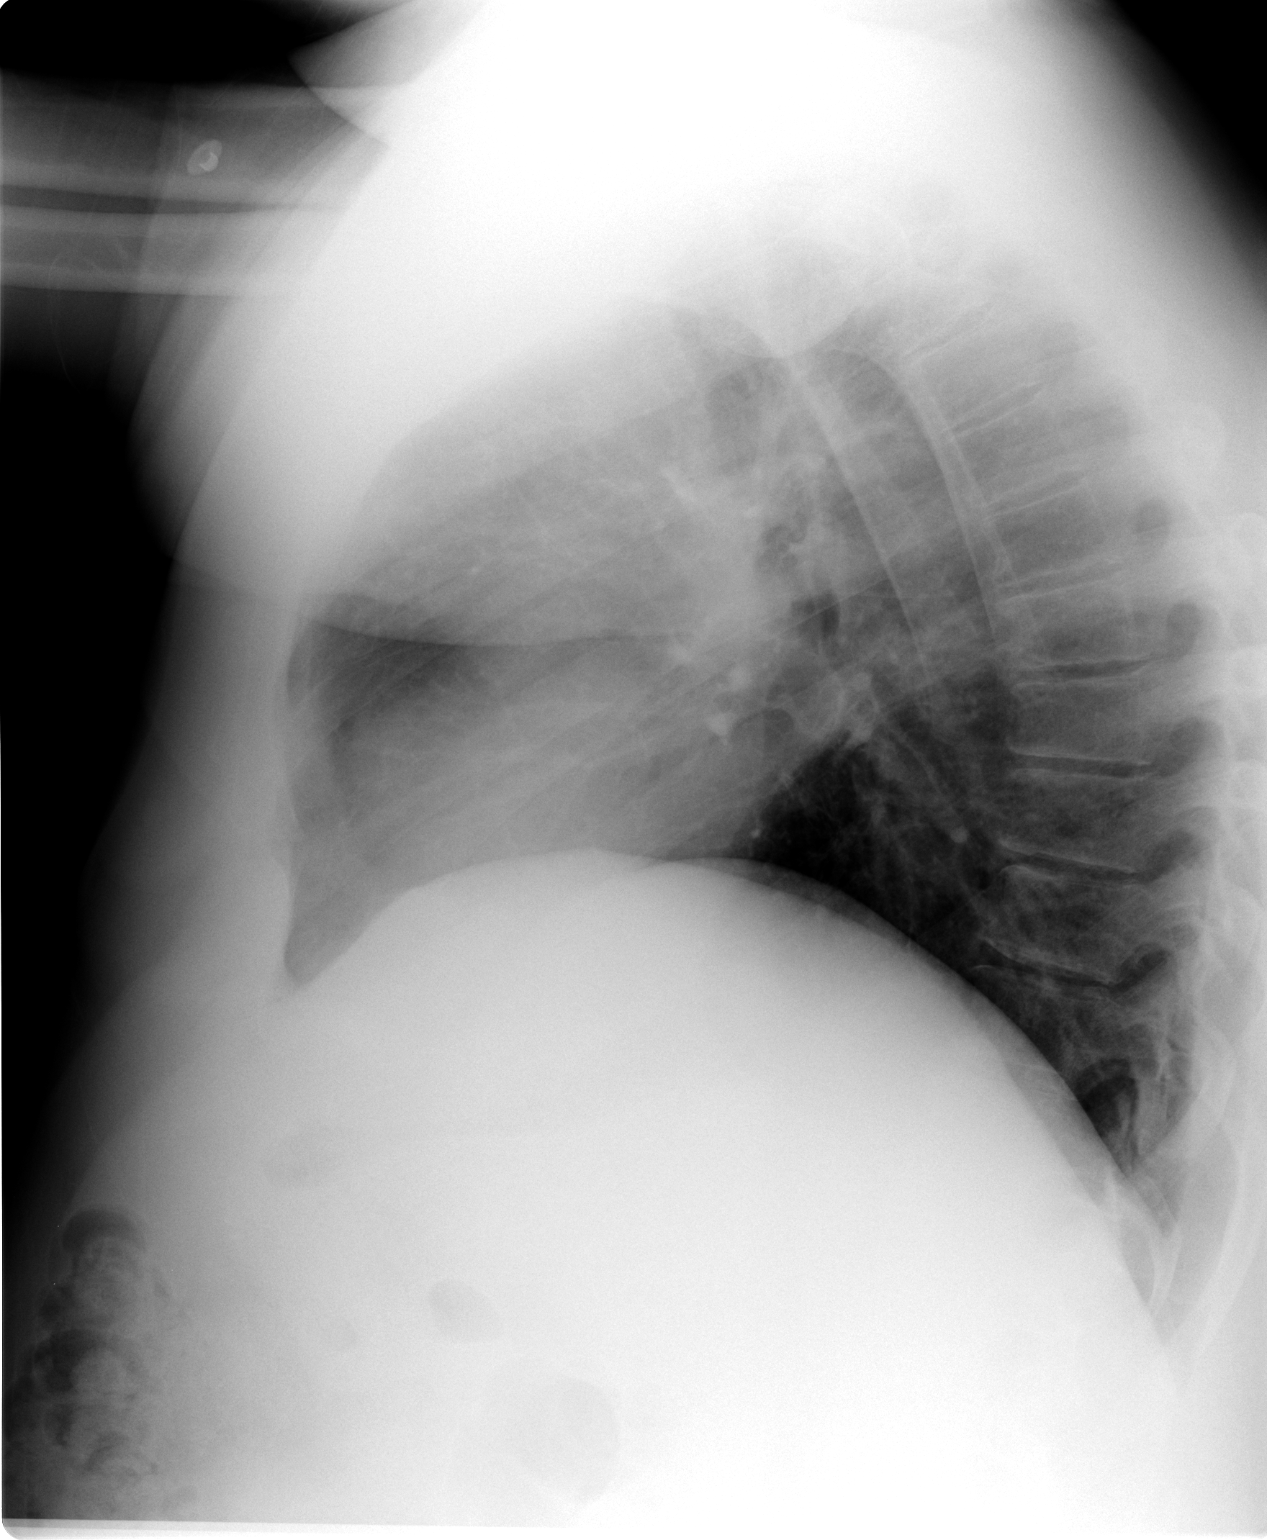

[2 of 2 positions shown; findings below may reference images not displayed]

FINDINGS: The lungs are clear.  Heart and mediastinal contours
normal.  Mid thoracic spine degenerative changes.
IMPRESSION: No acute cardiopulmonary process.

## 2009-03-16 ENCOUNTER — Ambulatory Visit (HOSPITAL_COMMUNITY): Admission: RE | Admit: 2009-03-16 | Discharge: 2009-03-16 | Payer: Self-pay | Admitting: Surgery

## 2009-04-23 ENCOUNTER — Encounter: Admission: RE | Admit: 2009-04-23 | Discharge: 2009-05-20 | Payer: Self-pay | Admitting: Surgery

## 2009-04-28 ENCOUNTER — Encounter (INDEPENDENT_AMBULATORY_CARE_PROVIDER_SITE_OTHER): Payer: Self-pay | Admitting: *Deleted

## 2009-06-29 ENCOUNTER — Ambulatory Visit: Payer: Self-pay | Admitting: Internal Medicine

## 2009-06-29 DIAGNOSIS — T783XXA Angioneurotic edema, initial encounter: Secondary | ICD-10-CM | POA: Insufficient documentation

## 2009-07-02 ENCOUNTER — Telehealth: Payer: Self-pay | Admitting: Internal Medicine

## 2009-07-02 ENCOUNTER — Ambulatory Visit: Payer: Self-pay | Admitting: Family Medicine

## 2009-07-02 LAB — CONVERTED CEMR LAB
ALT: 36 units/L (ref 0–53)
Albumin: 4.2 g/dL (ref 3.5–5.2)
Alkaline Phosphatase: 50 units/L (ref 39–117)
Basophils Absolute: 0 10*3/uL (ref 0.0–0.1)
Bilirubin, Direct: 0.1 mg/dL (ref 0.0–0.3)
Chloride: 109 meq/L (ref 96–112)
Creatinine, Ser: 0.9 mg/dL (ref 0.4–1.5)
Eosinophils Absolute: 0.3 10*3/uL (ref 0.0–0.7)
Eosinophils Relative: 5.9 % — ABNORMAL HIGH (ref 0.0–5.0)
HDL: 46 mg/dL (ref >39.00)
Hemoglobin: 13.9 g/dL (ref 13.0–17.0)
LDL Cholesterol: 95 mg/dL (ref 0–99)
Lymphocytes Relative: 20.1 % (ref 12.0–46.0)
Lymphs Abs: 1 10*3/uL (ref 0.7–4.0)
Monocytes Absolute: 0.5 10*3/uL (ref 0.1–1.0)
Monocytes Relative: 8.7 % (ref 3.0–12.0)
Potassium: 4.3 meq/L (ref 3.5–5.1)
RDW: 13.8 % (ref 11.5–14.6)
Sodium: 144 meq/L (ref 135–145)
TSH: 1.45 microintl units/mL (ref 0.35–5.50)
Total Bilirubin: 0.7 mg/dL (ref 0.3–1.2)
Triglycerides: 135 mg/dL (ref 0.0–149.0)
VLDL: 27 mg/dL (ref 0.0–40.0)
WBC: 5.2 10*3/uL (ref 4.5–10.5)

## 2009-07-16 ENCOUNTER — Ambulatory Visit: Payer: Self-pay | Admitting: Internal Medicine

## 2009-07-21 ENCOUNTER — Telehealth: Payer: Self-pay | Admitting: Internal Medicine

## 2009-07-30 ENCOUNTER — Encounter: Admission: RE | Admit: 2009-07-30 | Discharge: 2009-10-28 | Payer: Self-pay | Admitting: Surgery

## 2009-08-10 ENCOUNTER — Ambulatory Visit (HOSPITAL_COMMUNITY): Admission: RE | Admit: 2009-08-10 | Discharge: 2009-08-11 | Payer: Self-pay | Admitting: Surgery

## 2009-08-10 HISTORY — PX: LAPAROSCOPIC GASTRIC BANDING: SHX1100

## 2009-08-11 IMAGING — CR DG ABDOMEN 1V
2 series · 2 of 2 positions shown · non-contrast
Comparison: none

CLINICAL DATA: Morbid obesity, post gastric lap band.

ABDOMEN - 1 VIEW

[t abdomen supine (1 of 2)]
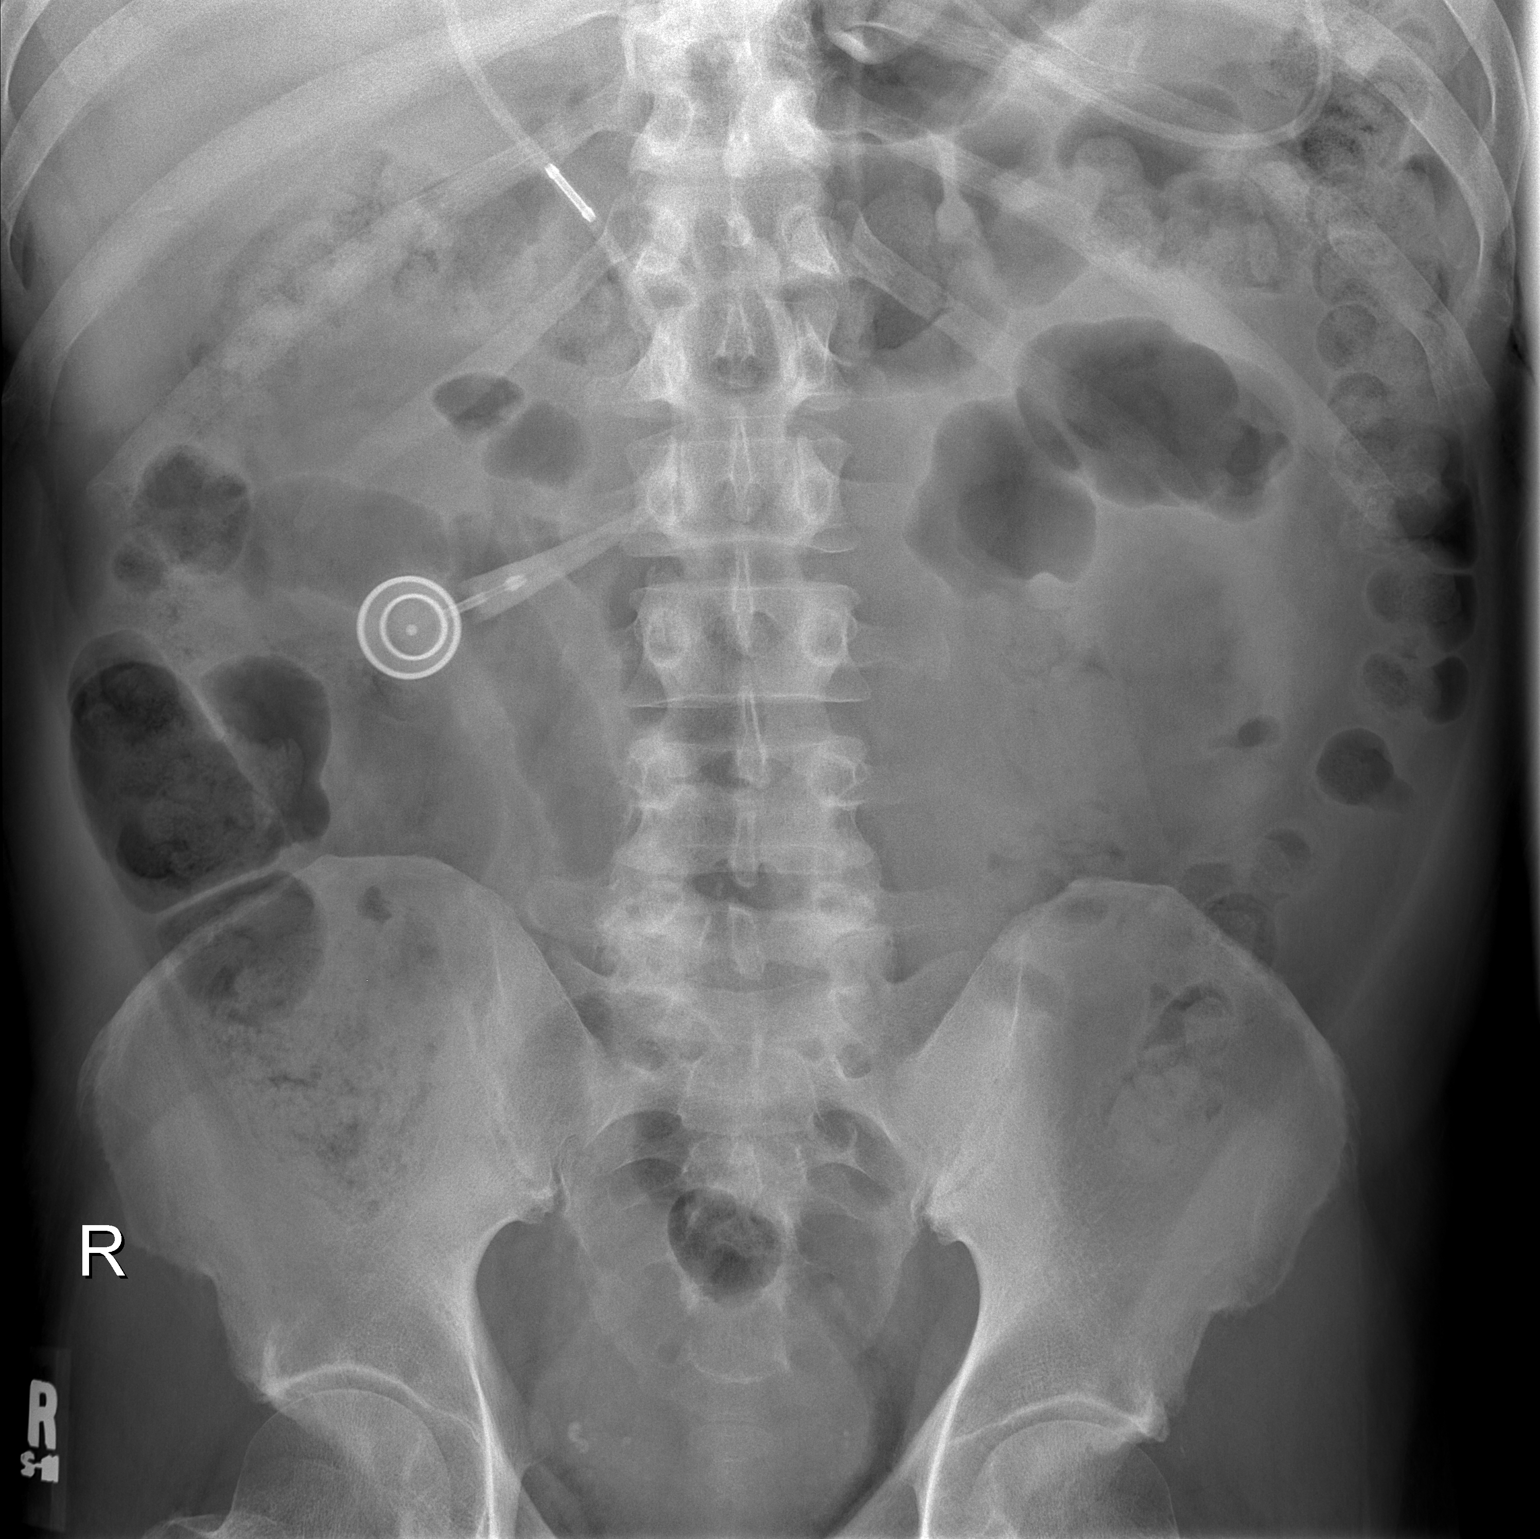

[t abdomen supine (2 of 2)]
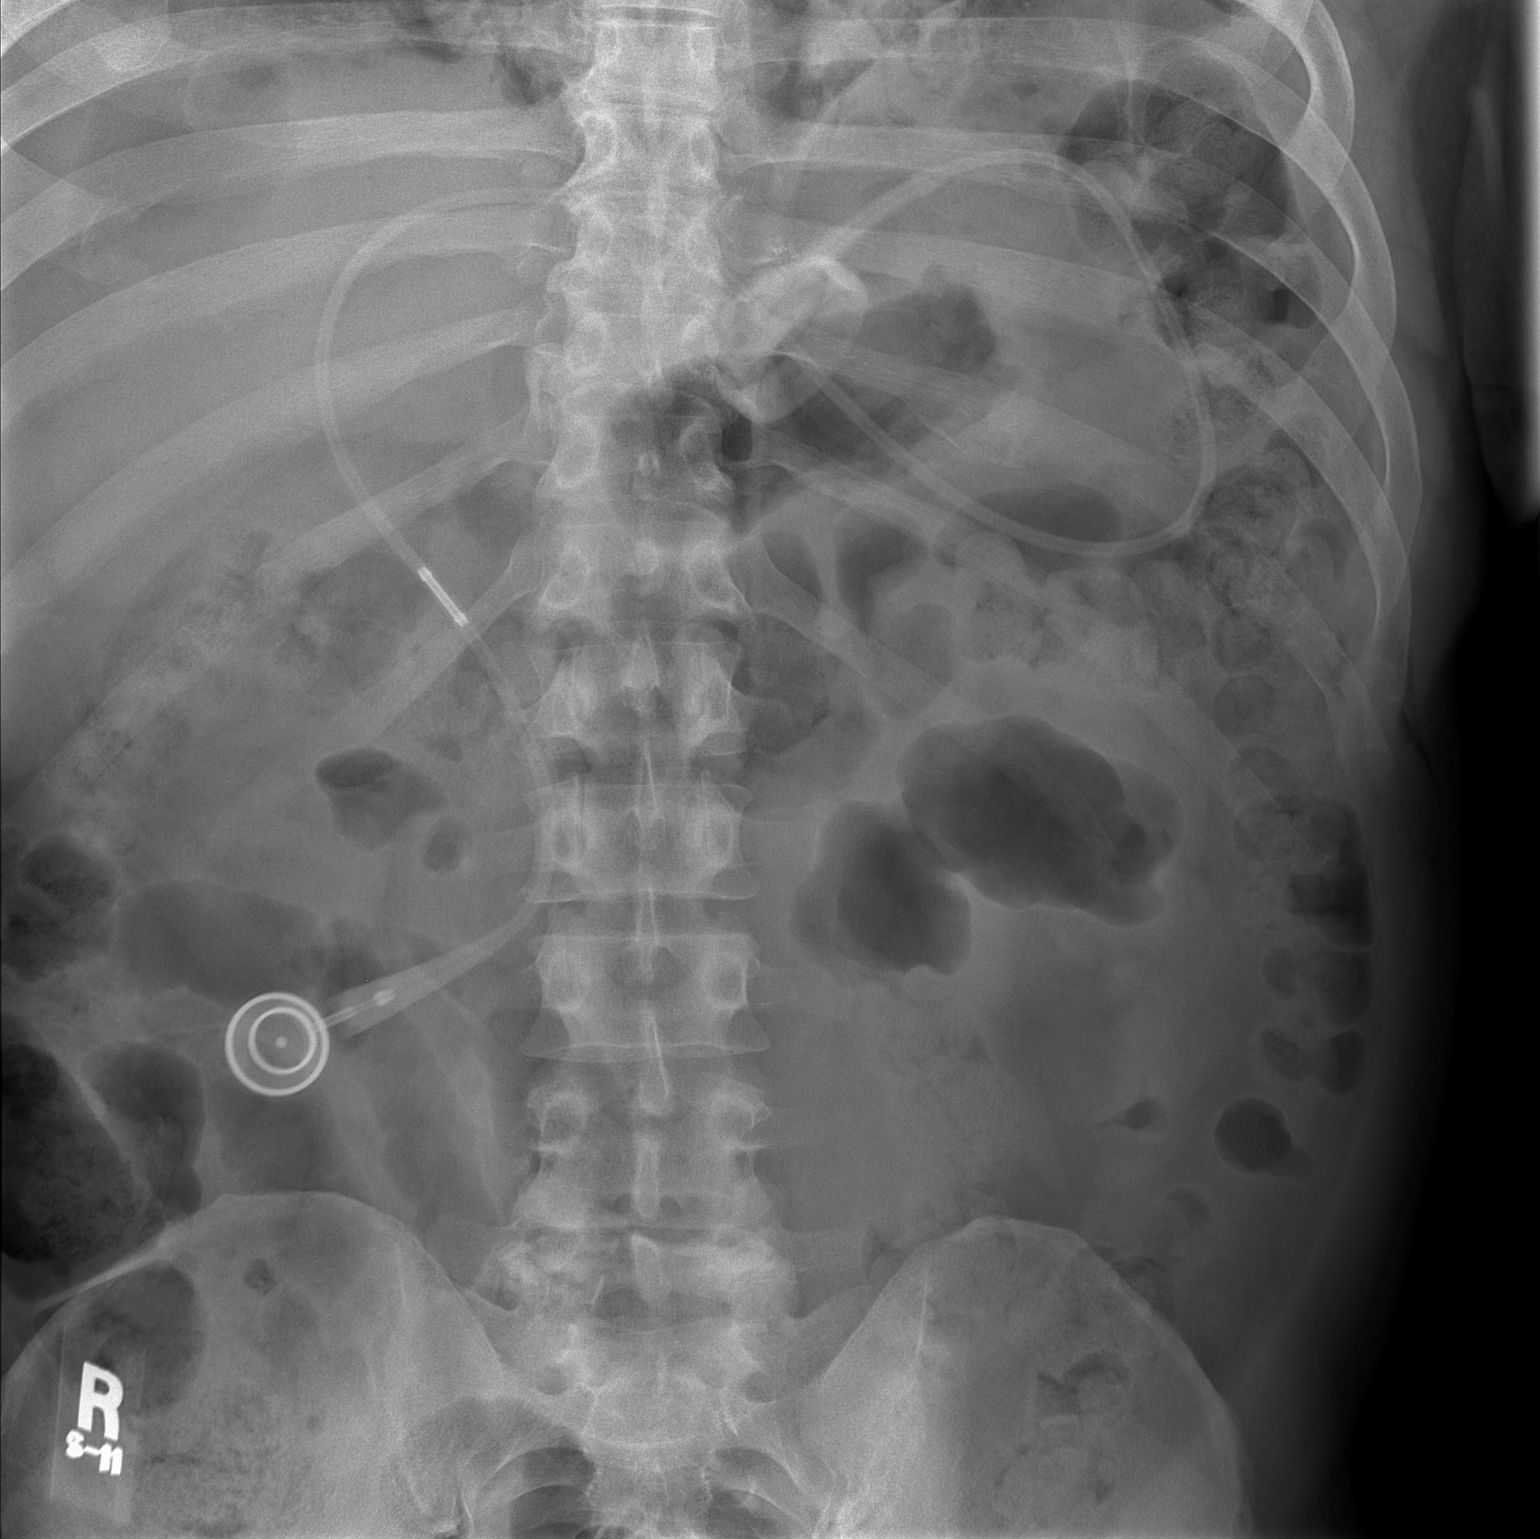

[2 of 2 positions shown; findings below may reference images not displayed]

FINDINGS: Interval gastric lap band is seen at the normal two to
eight o'clock orientation at the left upper quadrant.  The tubing
is intact. Normal gastrointestinal gas pattern is seen.
IMPRESSION: 1.  Interval gastric lap band is seen at normal two to eight
o'clock orientation at the left upper quadrant.
2.  Normal gastrointestinal gas pattern.

## 2009-08-20 ENCOUNTER — Ambulatory Visit: Payer: Self-pay | Admitting: Internal Medicine

## 2009-10-14 ENCOUNTER — Encounter: Payer: Self-pay | Admitting: Internal Medicine

## 2010-02-05 ENCOUNTER — Telehealth: Payer: Self-pay | Admitting: Internal Medicine

## 2010-02-12 ENCOUNTER — Encounter: Payer: Self-pay | Admitting: Internal Medicine

## 2010-02-16 ENCOUNTER — Ambulatory Visit: Payer: Self-pay | Admitting: Internal Medicine

## 2010-02-18 ENCOUNTER — Telehealth: Payer: Self-pay | Admitting: Internal Medicine

## 2010-02-19 LAB — CONVERTED CEMR LAB
AST: 18 units/L (ref 0–37)
Albumin: 4.4 g/dL (ref 3.5–5.2)
Calcium: 9.9 mg/dL (ref 8.4–10.5)
Cholesterol: 222 mg/dL — ABNORMAL HIGH (ref 0–200)
Direct LDL: 154.1 mg/dL
GFR calc non Af Amer: 110.71 mL/min (ref >60)
Glucose, Bld: 133 mg/dL — ABNORMAL HIGH (ref 70–99)
HDL: 40.3 mg/dL (ref >39.00)
Hgb A1c MFr Bld: 7 % — ABNORMAL HIGH (ref 4.6–6.5)
TSH: 1.04 microintl units/mL (ref 0.35–5.50)
Total Bilirubin: 0.6 mg/dL (ref 0.3–1.2)
Total CHOL/HDL Ratio: 6
Triglycerides: 118 mg/dL (ref 0.0–149.0)
VLDL: 23.6 mg/dL (ref 0.0–40.0)

## 2010-03-31 ENCOUNTER — Encounter: Payer: Self-pay | Admitting: Internal Medicine

## 2010-06-02 ENCOUNTER — Encounter: Payer: Self-pay | Admitting: Internal Medicine

## 2010-06-15 ENCOUNTER — Ambulatory Visit
Admission: RE | Admit: 2010-06-15 | Discharge: 2010-06-15 | Payer: Self-pay | Source: Home / Self Care | Attending: Internal Medicine | Admitting: Internal Medicine

## 2010-06-15 ENCOUNTER — Other Ambulatory Visit: Payer: Self-pay | Admitting: Internal Medicine

## 2010-06-15 LAB — LIPID PANEL
Cholesterol: 164 mg/dL (ref 0–200)
Total CHOL/HDL Ratio: 4

## 2010-06-15 LAB — HEPATIC FUNCTION PANEL
ALT: 23 U/L (ref 0–53)
Albumin: 4.1 g/dL (ref 3.5–5.2)

## 2010-06-15 LAB — BASIC METABOLIC PANEL
CO2: 32 mEq/L (ref 19–32)
Chloride: 105 mEq/L (ref 96–112)
Potassium: 4.3 mEq/L (ref 3.5–5.1)
Sodium: 142 mEq/L (ref 135–145)

## 2010-06-20 LAB — CONVERTED CEMR LAB
ALT: 42 units/L — ABNORMAL HIGH (ref 0–40)
AST: 24 units/L (ref 0–37)
Albumin: 4 g/dL (ref 3.5–5.2)
Alkaline Phosphatase: 32 units/L — ABNORMAL LOW (ref 39–117)
BUN: 14 mg/dL (ref 6–23)
Bilirubin, Direct: 0.1 mg/dL (ref 0.0–0.3)
CO2: 33 meq/L — ABNORMAL HIGH (ref 19–32)
Calcium: 9.6 mg/dL (ref 8.4–10.5)
Chloride: 106 meq/L (ref 96–112)
Cholesterol: 145 mg/dL (ref 0–200)
Creatinine, Ser: 0.9 mg/dL (ref 0.4–1.5)
Creatinine,U: 149.5 mg/dL
GFR calc Af Amer: 114 mL/min
GFR calc non Af Amer: 94 mL/min
Glucose, Bld: 161 mg/dL — ABNORMAL HIGH (ref 70–99)
HDL: 34.6 mg/dL — ABNORMAL LOW (ref >39.0)
Hgb A1c MFr Bld: 6.8 % — ABNORMAL HIGH (ref 4.6–6.0)
LDL Cholesterol: 81 mg/dL (ref 0–99)
Microalb Creat Ratio: 6 mg/g (ref 0.0–30.0)
Microalb, Ur: 0.9 mg/dL (ref 0.0–1.9)
Potassium: 4.4 meq/L (ref 3.5–5.1)
Sodium: 143 meq/L (ref 135–145)
Total Bilirubin: 0.8 mg/dL (ref 0.3–1.2)
Total CHOL/HDL Ratio: 4.2
Total Protein: 6.3 g/dL (ref 6.0–8.3)
Triglycerides: 147 mg/dL (ref 0–149)
VLDL: 29 mg/dL (ref 0–40)

## 2010-06-22 ENCOUNTER — Ambulatory Visit
Admission: RE | Admit: 2010-06-22 | Discharge: 2010-06-22 | Payer: Self-pay | Source: Home / Self Care | Attending: Internal Medicine | Admitting: Internal Medicine

## 2010-06-22 NOTE — Letter (Signed)
Summary: [facility]   Imported By: Maryln Gottron 12/11/2009 10:18:40  _____________________________________________________________________  External Attachment:    Type:   Image     Comment:   External Document

## 2010-06-22 NOTE — Assessment & Plan Note (Signed)
Summary: ELEVATATED BLOOD SUGAR 345/PER DR SWORDS/CJR   Vital Signs:  Patient profile:   60 year old male Weight:      249 pounds Temp:     99.0 degrees F oral BP sitting:   110 / 84  (left arm) Cuff size:   large  Vitals Entered By: Sid Falcon LPN (July 02, 2009 12:01 PM) CC: Elevated blood sugars Is Patient Diabetic? Yes Did you bring your meter with you today? No   History of Present Illness: Patient here as acute workin visit with elevated blood sugar.  Refer to recent notes. Long-standing history type 2 diabetes. Recent poor control with hemoglobin A1c 8.7%. Some mild thirst and urine frequency. Recommendation to start 70/30 insulin 15 units with breakfast 10 units with dinner. Patient has never used insulin. Currently on metformin. Recent discontinuation of Actos and glipizide.  CBG 345 fasting this AM.  Patient has pruritic slightly raised rash dorsum of both hands. No history of known contact allergy. Avoiding steroids secondary to elevated blood sugar.  Allergies: 1)  ! Tetanus Toxoid Fluid 2)  Tetanus Toxoid Adsorbed (Tetanus Toxoid Adsorbed)  Past History:  Past Medical History: Last updated: 11/20/2006 Diabetes mellitus, type II  12/04  CBG>300 OSA Hyperlipidemia Hypertension   allergic reaction to tetanus 01/05  Past Surgical History: Last updated: 11/20/2006 Appendectomy hand fx PMH reviewed for relevance  Review of Systems  The patient denies anorexia, fever, chest pain, syncope, dyspnea on exertion, peripheral edema, headaches, hemoptysis, and abdominal pain.    Physical Exam  General:  Well-developed,well-nourished,in no acute distress; alert,appropriate and cooperative throughout examination Lungs:  Normal respiratory effort, chest expands symmetrically. Lungs are clear to auscultation, no crackles or wheezes. Heart:  normal rate and regular rhythm.   Skin:  dorsum of both hands reveals erythematous vesicular type rash.  Nontender.   Impression & Recommendations:  Problem # 1:  DIABETES MELLITUS, TYPE II (ICD-250.00) Assessment Deteriorated as per Dr. Cato Mulligan recommendations we'll start 70/30 mix 15 units a.m. 10 units with dinner. Patient instructed on use. Followup Dr. Cato Mulligan one week.  Sample of Novolog 70/30 pen given. His updated medication list for this problem includes:    Metformin Hcl 1000 Mg Tabs (Metformin hcl) ..... One  twice daily    Adult Aspirin Ec Low Strength 81 Mg Tbec (Aspirin) ..... Once daily  Problem # 2:  CONTACT DERMATITIS (ICD-692.9) avoid oral or IM steroids secondary to poorly controlled diabetes. patient using topical steroid with mild relief  Complete Medication List: 1)  Caduet 5-40 Mg Tabs (Amlodipine-atorvastatin) .... One by mouth daily 2)  Freestyle Lancets Misc (Lancets) .... Follow package instructions 3)  Metformin Hcl 1000 Mg Tabs (Metformin hcl) .... One  twice daily 4)  Adult Aspirin Ec Low Strength 81 Mg Tbec (Aspirin) .... Once daily 5)  Freestyle Test Strp (Glucose blood) .... Use daily for glucose control 6)  Bystolic 20 Mg Tabs (Nebivolol hcl) .Marland Kitchen.. 1 by mouth daily 7)  Hydroxyzine Hcl 50 Mg Tabs (Hydroxyzine hcl) .... Take 1 tablet by mouth three times a day as needed allergy 8)  Bd Orignial Pen Needles 29 Gauge, 1/2 Inch  .... Use as directed two times a day  Patient Instructions: 1)  takes 15 units of 70/30 insulin mix with breakfast each day and 10 units with dinner 2)  Schedule followup appointment with Dr. Cato Mulligan in one week Prescriptions: BD DISP NEEDLES 30G X 1/2" MISC (NEEDLE (DISP)) use as directed two times a day  #100 x 3  Entered by:   Sid Falcon LPN   Authorized by:   Evelena Peat MD   Signed by:   Sid Falcon LPN on 16/02/9603   Method used:   Electronically to        [facility]. [address] [phone]* (retail)       [address]       [place], [place]  [address]       Ph: [phone] or [phone]       Fax:  [phone]   RxID:   [id number]

## 2010-06-22 NOTE — Assessment & Plan Note (Signed)
Summary: ? allergic reaction//ccm   Vital Signs:  Patient profile:   60 year old male Weight:      246 pounds BMI:     35.42 Temp:     98.4 degrees F Pulse rate:   60 / minute Resp:     12 per minute BP sitting:   106 / 90  (left arm)  Vitals Entered By: Gladis Riffle, RN (June 29, 2009 10:30 AM) CC: c/o swelling hands since 06/26/09 and now also face, necks, earlobe Is Patient Diabetic? Yes Did you bring your meter with you today? No Comments taking whole benicar-hct as pill is round and does not cut in half   CC:  c/o swelling hands since 06/26/09 and now also face, necks, and earlobe.  History of Present Illness: Friday a.m. woke up with red swollen hands---itchy  started then develping same sxs around face---rash now of face and neck  ear lobes swollen no unusual foods, no new medciations no OTC meds has not been outside of significance  has used benadryl has not had any trouble breathing or swallowing  All other systems reviewed and were negative   Preventive Screening-Counseling & Management  Alcohol-Tobacco     Smoking Status: current     Cigars/week: 0.5  Current Problems (verified): 1)  Obesity  (ICD-278.00) 2)  Sleep Apnea  (ICD-780.57) 3)  Hypertension  (ICD-401.9) 4)  Hyperlipidemia  (ICD-272.4) 5)  Diabetes Mellitus, Type II  (ICD-250.00)  Current Medications (verified): 1)  Actos 30 Mg Tabs (Pioglitazone Hcl) .... Take 1 Tablet By Mouth Once A Day 2)  Benicar Hct 40-25 Mg Tabs (Olmesartan Medoxomil-Hctz) .... Take 1 Tablet By Mouth Once A Day 3)  Caduet 5-40 Mg Tabs (Amlodipine-Atorvastatin) .... One By Mouth Daily 4)  Freestyle Lancets  Misc (Lancets) .... Follow Package Instructions 5)  Glipizide 5 Mg Tb24 (Glipizide) .... One By Mouth Daily 6)  Metformin Hcl 1000 Mg Tabs (Metformin Hcl) .... One  Twice Daily 7)  Adult Aspirin Ec Low Strength 81 Mg  Tbec (Aspirin) .... Once Daily 8)  Freestyle Test  Strp (Glucose Blood) .... Use Daily For Glucose  Control  Allergies: 1)  ! Tetanus Toxoid Fluid 2)  Tetanus Toxoid Adsorbed (Tetanus Toxoid Adsorbed)  Past History:  Past Medical History: Last updated: 11/20/2006 Diabetes mellitus, type II  12/04  CBG>300 OSA Hyperlipidemia Hypertension   allergic reaction to tetanus 01/05  Past Surgical History: Last updated: 11/20/2006 Appendectomy hand fx  Social History: Last updated: 02/27/2007 Occupation: Married Regular exercise-no  Risk Factors: Exercise: no (02/27/2007)  Risk Factors: Smoking Status: current (06/29/2009) Cigars/wk: 0.5 (06/29/2009)  Physical Exam  General:  alert and well-developed.   Head:  face is swollen and red Eyes:  eye lids--swollen and erythematous Ears:  left ear lobe swollen Mouth:  Oral mucosa and oropharynx without lesions or exudates.  Teeth in good repair. Neck:  No deformities, masses, or tenderness noted. Chest Wall:  No deformities, masses, tenderness or gynecomastia noted. Lungs:  Normal respiratory effort, chest expands symmetrically. Lungs are clear to auscultation, no crackles or wheezes. Heart:  normal rate and regular rhythm.   Abdomen:  soft and non-tender.   Msk:  No deformity or scoliosis noted of thoracic or lumbar spine.   Neurologic:  cranial nerves II-XII intact and gait normal.   Skin:  skin over dorsum of hands---swollen and red Psych:  memory intact for recent and remote and good eye contact.     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 Take 1 tablet by mouth once a day or as  directed His updated medication list for this problem includes:    Caduet 5-40 Mg Tabs (Amlodipine-atorvastatin) ..... One by mouth daily    Bystolic 20 Mg Tabs (Nebivolol hcl) .Marland Kitchen... 1 by mouth daily  Orders: TLB-Lipid Panel (80061-LIPID) TLB-BMP (Basic Metabolic Panel-BMET) (80048-METABOL) TLB-Hepatic/Liver Function Pnl (80076-HEPATIC) TLB-TSH (Thyroid Stimulating Hormone) (84443-TSH) TLB-A1C / Hgb A1C (Glycohemoglobin) (83036-A1C)  BP today: 106/90 Prior BP: 108/74 (07/29/2008)  Prior 10 Yr Risk Heart Disease: 18 % (06/11/2007)  Labs Reviewed: K+: 4.5 (07/15/2008) Creat: : 0.8 (07/15/2008)   Chol: 135 (07/15/2008)   HDL: 31.3 (07/15/2008)   LDL: 83 (07/15/2008)   TG: 106 (07/15/2008)  Problem # 3:  HYPERLIPIDEMIA (ICD-272.4)  His updated medication list for this problem includes:    Caduet 5-40 Mg Tabs (Amlodipine-atorvastatin) ..... One by mouth daily  Orders: TLB-Lipid Panel (80061-LIPID) TLB-BMP (Basic Metabolic Panel-BMET) (80048-METABOL) TLB-Hepatic/Liver Function Pnl (80076-HEPATIC) TLB-TSH (Thyroid Stimulating Hormone) (84443-TSH) TLB-A1C / Hgb A1C (Glycohemoglobin) (83036-A1C)  Problem # 4:  DIABETES MELLITUS, TYPE II (ICD-250.00) check labs today suspect poor control The following medications were removed from the medication list:    Actos 30 Mg Tabs (Pioglitazone hcl) .Marland Kitchen... Take 1 tablet by mouth once a day    Benicar Hct 40-25 Mg Tabs (Olmesartan medoxomil-hctz) .Marland Kitchen... Take 1 tablet by mouth once a day or as directed    Glipizide 5 Mg Tb24 (Glipizide) ..... One by mouth daily His updated medication list for this problem includes:    Metformin Hcl 1000 Mg Tabs (Metformin hcl) ..... One  twice daily    Adult Aspirin Ec Low Strength 81 Mg Tbec (Aspirin) ..... Once daily  Orders: TLB-Lipid Panel (80061-LIPID) TLB-BMP (Basic Metabolic Panel-BMET) (80048-METABOL) TLB-Hepatic/Liver Function Pnl (80076-HEPATIC) TLB-TSH (Thyroid Stimulating Hormone)  (84443-TSH) TLB-A1C / Hgb A1C (Glycohemoglobin) (83036-A1C)  Complete Medication List: 1)  Caduet 5-40 Mg Tabs (Amlodipine-atorvastatin) .... One by mouth daily 2)  Freestyle Lancets Misc (Lancets) .... Follow package instructions 3)  Metformin Hcl 1000 Mg Tabs (Metformin hcl) .... One  twice daily 4)  Adult Aspirin Ec Low Strength 81 Mg Tbec (Aspirin) .... Once daily 5)  Freestyle Test Strp (Glucose blood) .... Use daily for glucose control 6)  Bystolic 20 Mg Tabs (Nebivolol hcl) .Marland Kitchen.. 1 by mouth daily 7)  Hydroxyzine Hcl 50 Mg Tabs (Hydroxyzine hcl) .... Take 1 tablet by mouth three times a day as needed allergy  Patient Instructions: 1)  see me one week Prescriptions: HYDROXYZINE HCL 50 MG TABS (HYDROXYZINE HCL) Take 1 tablet by mouth three times a day as needed allergy  #40 x 0   Entered and Authorized by:   Birdie Sons MD   Signed by:   Birdie Sons MD on 06/29/2009   Method used:   Electronically to        [facility] [address] [phone]* (retail)       [address]       [place], [place]  [address]       Ph: [phone] or [phone]  Fax: [phone]   RxID:   [id number] BYSTOLIC 20 MG TABS (NEBIVOLOL HCL) 1 by mouth daily  #20 x 0   Entered and Authorized by:   Birdie Sons MD   Signed by:   Birdie Sons MD on 06/29/2009   Method used:   Samples Given   RxID:   [id number]

## 2010-06-22 NOTE — Letter (Signed)
Summary: [facility]   Imported By: Maryln Gottron 03/08/2010 09:45:44  _____________________________________________________________________  External Attachment:    Type:   Image     Comment:   External Document

## 2010-06-22 NOTE — Progress Notes (Signed)
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  See notation attatched to lab results.   Initial call taken by: Debbra Riding,  February 18, 2010 2:25 PM    New/Updated Medications: SIMVASTATIN 20 MG TABS (SIMVASTATIN) one by mouth daily Prescriptions: SIMVASTATIN 20 MG TABS (SIMVASTATIN) one by mouth daily  #90 x 3   Entered by:   Lynann Beaver CMA   Authorized by:   Birdie Sons MD   Signed by:   Lynann Beaver CMA on 02/18/2010   Method used:   Electronically to        [facility] [address] [phone]* (retail)       [address] [place], [place]  [address]       Ph: [phone]       Fax: [phone]   RxID:   [id number]

## 2010-06-22 NOTE — Progress Notes (Signed)
Summary: Pt says his  fasting blood sugar reading this am was 345  Phone Note Call from Patient Call back at [phone] cell   Caller: Patient Summary of Call: Pt called and said that Dr. Cato Mulligan took pt off of Glipiside and Actos and was also taken off of Benicar and switch to [employer]. Pt said that his fasting blood sugar reading this morning was 345. Pt is wanting to know what he should do. Please call asap.  Initial call taken by: Lucy Antigua,  July 02, 2009 8:18 AM  Follow-up for Phone Call        [facility] to come in ASAP, and to call for appt with Dr. Caryl Never. Follow-up by: Lynann Beaver CMA,  July 02, 2009 8:30 AM  Additional Follow-up for Phone Call Additional follow up Details #1::        Pt called back and I sch him for an appt with Dr. Caryl Never at 11:45 am today 07/02/09, as noted above.  Additional Follow-up by: Lucy Antigua,  July 02, 2009 9:14 AM

## 2010-06-22 NOTE — Assessment & Plan Note (Signed)
Summary: fu/pt will come in fasting/njr   Vital Signs:  Patient profile:   60 year old male Weight:      230 pounds Temp:     98.4 degrees F oral BP sitting:   104 / 72  Vitals Entered By: Lynann Beaver CMA (August 20, 2009 8:13 AM)  History of Present Illness:  Follow-Up Visit      This is a 60 year old man who presents for Follow-up visit.  The patient denies chest pain and palpitations.  Since the last visit the patient notes no new problems or concerns, a recent hospitilization, and being seen by a specialist.  The patient reports taking meds as prescribed.  When questioned about possible medication side effects, the patient notes none.  recent lap-band surgery surgery 08/10/09---tolerating liquid diet.   All other systems reviewed and were negative   Current Problems (verified): 1)  Angioedema  (ICD-995.1) 2)  Obesity  (ICD-278.00) 3)  Sleep Apnea  (ICD-780.57) 4)  Hypertension  (ICD-401.9) 5)  Hyperlipidemia  (ICD-272.4) 6)  Diabetes Mellitus, Type II  (ICD-250.00)  Current Medications (verified): 1)  Caduet 5-40 Mg Tabs (Amlodipine-Atorvastatin) .... One By Mouth Daily 2)  Freestyle Lancets  Misc (Lancets) .... Follow Package Instructions 3)  Metformin Hcl 1000 Mg Tabs (Metformin Hcl) .... One  Twice Daily 4)  Adult Aspirin Ec Low Strength 81 Mg  Tbec (Aspirin) .... Once Daily 5)  Freestyle Test  Strp (Glucose Blood) .... Use Daily For Glucose Control 6)  Bystolic 20 Mg Tabs (Nebivolol Hcl) .Marland Kitchen.. 1 By Mouth Daily 7)  Bd Pen Needle Short U/f 31g X 8 Mm Misc (Insulin Pen Needle) .... Use Bid 8)  Novolog Mix 70/30 Flexpen 70-30 % Susp (Insulin Aspart Prot & Aspart) .... 20 Units A.m. and 15 Units With Dinner  Subcutaneously Injection  Allergies (verified): 1)  ! Tetanus Toxoid Fluid 2)  Tetanus Toxoid Adsorbed (Tetanus Toxoid Adsorbed)  Past History:  Past Medical History: Last updated: 11/20/2006 Diabetes mellitus, type II  12/04   CBG>300 OSA Hyperlipidemia Hypertension   allergic reaction to tetanus 01/05  Social History: Last updated: 02/27/2007 Occupation: Married Regular exercise-no  Risk Factors: Exercise: no (02/27/2007)  Risk Factors: Smoking Status: current (07/16/2009) Cigars/wk: 0.5 (07/16/2009)  Past Surgical History: Appendectomy hand fx lap band 08/10/09  Physical Exam  General:  alert and well-developed.   Head:  normocephalic and atraumatic.   Eyes:  pupils equal and pupils round.   Ears:  R ear normal and L ear normal.   Neck:  No deformities, masses, or tenderness noted. Chest Wall:  No deformities, masses, tenderness or gynecomastia noted. Lungs:  Normal respiratory effort, chest expands symmetrically. Lungs are clear to auscultation, no crackles or wheezes. Heart:  normal rate and regular rhythm.   Abdomen:  soft and non-tender.   overweight 6 healing laparoscopy sites palpable lap band port--right abdomen Msk:  No deformity or scoliosis noted of thoracic or lumbar spine.   Pulses:  R radial normal and L radial normal.   Neurologic:  cranial nerves II-XII intact and gait normal.     Impression & Recommendations:  Problem # 1:  HYPERTENSION (ICD-401.9) much improved with weight loss decrease meds---see list His updated medication list for this problem includes:    Caduet 5-40 Mg Tabs (Amlodipine-atorvastatin) ..... One by mouth daily    Bystolic 20 Mg Tabs (Nebivolol hcl) .Marland Kitchen... 1/2 by mouth daily  BP today: 104/72 Prior BP: 130/80 (07/16/2009)  Prior 10 Yr Risk Heart Disease:  18 % (06/11/2007)  Labs Reviewed: K+: 4.3 (06/29/2009) Creat: : 0.9 (06/29/2009)   Chol: 168 (06/29/2009)   HDL: 46.00 (06/29/2009)   LDL: 95 (06/29/2009)   TG: 135.0 (06/29/2009)  Problem # 2:  HYPERLIPIDEMIA (ICD-272.4) continue current medications  His updated medication list for this problem includes:    Caduet 5-40 Mg Tabs (Amlodipine-atorvastatin) ..... One by mouth daily  Labs  Reviewed: SGOT: 19 (06/29/2009)   SGPT: 36 (06/29/2009)  Lipid Goals: Chol Goal: 200 (06/11/2007)   HDL Goal: 40 (06/11/2007)   LDL Goal: 100 (06/11/2007)   TG Goal: 150 (06/11/2007)  Prior 10 Yr Risk Heart Disease: 18 % (06/11/2007)   HDL:46.00 (06/29/2009), 31.3 (07/15/2008)  LDL:95 (06/29/2009), 83 (07/15/2008)  Chol:168 (06/29/2009), 135 (07/15/2008)  Trig:135.0 (06/29/2009), 106 (07/15/2008)  Problem # 3:  DIABETES MELLITUS, TYPE II (ICD-250.00) much improved by hx a.m. cbgs ranging between 65-80 stop insulin call me if cbgs consistently above 140 continue weight loss---walk daily The following medications were removed from the medication list:    Novolog Mix 70/30 Flexpen 70-30 % Susp (Insulin aspart prot & aspart) .Marland Kitchen... 20 units a.m. and 15 units with dinner  subcutaneously injection His updated medication list for this problem includes:    Metformin Hcl 1000 Mg Tabs (Metformin hcl) ..... One  twice daily    Adult Aspirin Ec Low Strength 81 Mg Tbec (Aspirin) ..... Once daily  Labs Reviewed: Creat: 0.9 (06/29/2009)     Last Eye Exam: normal-pt's report (10/22/2007) Reviewed HgBA1c results: 8.7 (06/29/2009)  7.8 (07/15/2008)  Complete Medication List: 1)  Caduet 5-40 Mg Tabs (Amlodipine-atorvastatin) .... One by mouth daily 2)  Freestyle Lancets Misc (Lancets) .... Follow package instructions 3)  Metformin Hcl 1000 Mg Tabs (Metformin hcl) .... One  twice daily 4)  Adult Aspirin Ec Low Strength 81 Mg Tbec (Aspirin) .... Once daily 5)  Freestyle Test Strp (Glucose blood) .... Use daily for glucose control 6)  Bystolic 20 Mg Tabs (Nebivolol hcl) .... 1/2 by mouth daily 7)  Bd Pen Needle Short U/f 31g X 8 Mm Misc (Insulin pen needle) .... Use bid  Patient Instructions: 1)  Please schedule a follow-up appointment in 2 months. 2)  labs one week prior to visit 3)  lipids---272.4 4)  lfts-995.2 5)  bmet-995.2 6)  A1C-250.02 7)    8)  cbcdiff--995.2

## 2010-06-22 NOTE — Assessment & Plan Note (Signed)
Summary: F/U POST LAP BAND PROCEDURE // RS   Vital Signs:  Patient profile:   60 year old male Weight:      221 pounds Temp:     98.5 degrees F oral Pulse rate:   66 / minute Pulse rhythm:   regular Resp:     12 per minute BP sitting:   122 / 82  Vitals Entered By: Lynann Beaver CMA (February 16, 2010 8:41 AM) CC: rov Is Patient Diabetic? Yes Pain Assessment Patient in pain? no        CC:  rov.  History of Present Illness:  Follow-Up Visit      This is a 60 year old man who presents for Follow-up visit.  The patient denies chest pain and palpitations.  Since the last visit the patient notes a recent hospitilization and being seen by a specialist.  The patient reports taking meds as prescribed.  When questioned about possible medication side effects, the patient notes none.    All other systems reviewed and were negative   Current Problems (verified): 1)  Angioedema  (ICD-995.1) 2)  Obesity  (ICD-278.00) 3)  Sleep Apnea  (ICD-780.57) 4)  Hypertension  (ICD-401.9) 5)  Hyperlipidemia  (ICD-272.4) 6)  Diabetes Mellitus, Type II  (ICD-250.00)  Current Medications (verified): 1)  None  Allergies (verified): 1)  ! Tetanus Toxoid Fluid 2)  Tetanus Toxoid Adsorbed (Tetanus Toxoid Adsorbed)  Past History:  Past Medical History: Last updated: 11/20/2006 Diabetes mellitus, type II  12/04  CBG>300 OSA Hyperlipidemia Hypertension   allergic reaction to tetanus 01/05  Past Surgical History: Last updated: 08/20/2009 Appendectomy hand fx lap band 08/10/09  Social History: Last updated: 02/27/2007 Occupation: Married Regular exercise-no  Risk Factors: Exercise: no (02/27/2007)  Risk Factors: Smoking Status: current (07/16/2009) Cigars/wk: 0.5 (07/16/2009)  Physical Exam  General:  alert and well-developed.   Head:  normocephalic and atraumatic.   Eyes:  pupils equal and pupils round.   Abdomen:  soft and non-tender.   Msk:  normal ROM and no joint  tenderness.   Pulses:  R radial normal and L radial normal.   Neurologic:  cranial nerves II-XII intact and gait normal.   Skin:  turgor normal and color normal.   Cervical Nodes:  no anterior cervical adenopathy and no posterior cervical adenopathy.   Psych:  normally interactive and good eye contact.    Diabetes Management Exam:    Eye Exam:       Eye Exam done elsewhere          Date: 01/21/2010          Results: normal-pts' report          Done by: ophthal   Impression & Recommendations:  Problem # 1:  OBESITY (ICD-278.00) s/p lap band advised daily exercise and low calorie diet  Problem # 2:  HYPERTENSION (ICD-401.9)  has stopped all meds after lap band The following medications were removed from the medication list:    Caduet 5-40 Mg Tabs (Amlodipine-atorvastatin) ..... One by mouth daily    Bystolic 20 Mg Tabs (Nebivolol hcl) .Marland Kitchen... 1/2 by mouth daily  BP today: 122/82 Prior BP: 104/72 (08/20/2009)  Prior 10 Yr Risk Heart Disease: 18 % (06/11/2007)  Labs Reviewed: K+: 4.3 (06/29/2009) Creat: : 0.9 (06/29/2009)   Chol: 168 (06/29/2009)   HDL: 46.00 (06/29/2009)   LDL: 95 (06/29/2009)   TG: 135.0 (06/29/2009)  Orders: Venipuncture (88416) TLB-BMP (Basic Metabolic Panel-BMET) (80048-METABOL)  Problem # 3:  DIABETES MELLITUS,  TYPE II (ICD-250.00)  has stopped all med check labs today The following medications were removed from the medication list:    Metformin Hcl 1000 Mg Tabs (Metformin hcl) ..... One  twice daily    Adult Aspirin Ec Low Strength 81 Mg Tbec (Aspirin) ..... Once daily  Labs Reviewed: Creat: 0.9 (06/29/2009)     Last Eye Exam: normal-pts' report (01/21/2010) Reviewed HgBA1c results: 8.7 (06/29/2009)  7.8 (07/15/2008)  Orders: TLB-A1C / Hgb A1C (Glycohemoglobin) (83036-A1C)  Problem # 4:  HYPERLIPIDEMIA (ICD-272.4)  check labs today The following medications were removed from the medication list:    Caduet 5-40 Mg Tabs  (Amlodipine-atorvastatin) ..... One by mouth daily  Labs Reviewed: SGOT: 19 (06/29/2009)   SGPT: 36 (06/29/2009)  Lipid Goals: Chol Goal: 200 (06/11/2007)   HDL Goal: 40 (06/11/2007)   LDL Goal: 100 (06/11/2007)   TG Goal: 150 (06/11/2007)  Prior 10 Yr Risk Heart Disease: 18 % (06/11/2007)   HDL:46.00 (06/29/2009), 31.3 (07/15/2008)  LDL:95 (06/29/2009), 83 (07/15/2008)  Chol:168 (06/29/2009), 135 (07/15/2008)  Trig:135.0 (06/29/2009), 106 (07/15/2008)  Orders: TLB-Lipid Panel (80061-LIPID) TLB-Hepatic/Liver Function Pnl (80076-HEPATIC) TLB-TSH (Thyroid Stimulating Hormone) (84443-TSH)  Patient Instructions: 1)  Please schedule a follow-up appointment in 6 months.  Appended Document: Orders Update    Clinical Lists Changes  Orders: Added new Service order of Specimen Handling ([address]109) - Signed

## 2010-06-22 NOTE — Progress Notes (Signed)
Summary: new rx needed bystolic  Phone Note Call from Patient Call back at [phone]   Caller: Patient-------voicemail Summary of Call: On new samples of Bystolic 20mg . Needs new rx called to [facility] on [address]. Will run out of samples by this Friday. Initial call taken by: Warnell Forester,  July 21, 2009 10:30 AM  Follow-up for Phone Call        see Rx.  Pt needs to make appt for 2 week rov around 3/10.Gladis Riffle, RN  July 21, 2009 5:29 PM   Patient notified. Appt made for 07/29/09 Follow-up by: Gladis Riffle, RN,  July 22, 2009 3:19 PM    Prescriptions: BYSTOLIC 20 MG TABS (NEBIVOLOL HCL) 1 by mouth daily  #30 x 0   Entered by:   Gladis Riffle, RN   Authorized by:   Birdie Sons MD   Signed by:   Gladis Riffle, RN on 07/21/2009   Method used:   Electronically to        [facility] [address] [phone]* (retail)       [address]       [place], [place]  [address]       Ph: [phone] or [phone]       Fax: [phone]   RxID:   [id number]

## 2010-06-22 NOTE — Progress Notes (Signed)
Summary: REQ FOR Rx  Phone Note Call from Patient   Caller: Patient    [phone] Summary of Call: Pt called in to adv that he has started using a new glucometer and he needs a Rx for strips / lancets........Marland KitchenGlucometer - One Touch Ultra Mini (pt has glucometer) ....  Needs Rx sent in for  (Strips - One Touch Ultra)  and  (Lancets - One Touch delica)..... [facility] [place].  Initial call taken by: Debbra Riding,  February 05, 2010 9:35 AM    New/Updated Medications: ONETOUCH ULTRASOFT LANCETS  MISC (LANCETS) use daily for glucose control ONETOUCH TEST  STRP (GLUCOSE BLOOD) use daily for glucose control Prescriptions: ONETOUCH TEST  STRP (GLUCOSE BLOOD) use daily for glucose control  #100 x 3   Entered by:   Kern Reap CMA (AAMA)   Authorized by:   Birdie Sons MD   Signed by:   Kern Reap CMA (AAMA) on 02/05/2010   Method used:   Electronically to        [facility] [address] [phone]* (retail)       [address] [place], [place]  [address]       Ph: [phone]       Fax: [phone]   RxID:   [id number] ONETOUCH ULTRASOFT LANCETS  MISC (LANCETS) use daily for glucose control  #100 x 3   Entered by:   Kern Reap CMA (AAMA)   Authorized by:   Birdie Sons MD   Signed by:   Kern Reap CMA (AAMA) on 02/05/2010   Method used:   Electronically to        [facility] [address] [phone]* (retail)       [address] [place], [place]  [address]       Ph: [phone]       Fax: [phone]   RxID:   [id number]

## 2010-06-22 NOTE — Letter (Signed)
Summary: [facility]   Imported By: Maryln Gottron 11/04/2009 15:22:23  _____________________________________________________________________  External Attachment:    Type:   Image     Comment:   External Document

## 2010-06-22 NOTE — Letter (Signed)
Summary: [facility]   Imported By: Maryln Gottron 04/21/2010 13:08:14  _____________________________________________________________________  External Attachment:    Type:   Image     Comment:   External Document

## 2010-06-22 NOTE — Assessment & Plan Note (Signed)
Summary: 2 week rov/et   Vital Signs:  Patient profile:   60 year old male Height:      70 inches Weight:      250 pounds BMI:     36.00 Temp:     98.1 degrees F oral Pulse rate:   72 / minute Resp:     14 per minute BP sitting:   130 / 80  (left arm)  Vitals Entered By: Willy Eddy, LPN (July 16, 2009 8:31 AM)  CC: roa from rash- rash remains on hands only-fasting blood sugars remain elevated(see blood sugar recordings)- Is Patient Diabetic? Yes   CC:  roa from rash- rash remains on hands only-fasting blood sugars remain elevated(see blood sugar recordings)-.  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 One By Mouth Daily 2)  Freestyle Lancets  Misc (Lancets) .... Follow Package Instructions 3)  Metformin Hcl 1000 Mg Tabs (Metformin Hcl) .... One  Twice Daily 4)  Adult Aspirin Ec Low Strength 81 Mg  Tbec (Aspirin) .... Once Daily 5)  Freestyle Test  Strp (Glucose Blood) .... Use Daily For Glucose Control 6)  Bystolic 20 Mg Tabs (Nebivolol Hcl) .Marland Kitchen.. 1 By Mouth Daily 7)  Bd Orignial Pen Needles 29 Gauge, 1/2 Inch .... Use As Directed Two Times A Day 8)  Novolog Flex Pen 70/30 Insulin .... Inject Sub Cutaneous 15 U With First Meal and 10 U With Dinner Meal Daily  Allergies (verified): 1)  ! Tetanus Toxoid Fluid 2)  Tetanus Toxoid Adsorbed (Tetanus Toxoid Adsorbed)  Physical Exam  General:  Well-developed,well-nourished,in no acute distress; alert,appropriate and cooperative throughout examination Neck:  No deformities, masses, or tenderness  noted. Lungs:  Normal respiratory effort, chest expands symmetrically. Lungs are clear to auscultation, no crackles or wheezes. Heart:  normal rate and regular rhythm.   Abdomen:  soft and non-tender.     Impression & Recommendations:  Problem # 1:  DIABETES MELLITUS, TYPE II (ICD-250.00) main issue is obesity has scheduled for lap band increase novolog side efects discussed monitor CBGs three times a day  His updated medication list for this problem includes:    Metformin Hcl 1000 Mg Tabs (Metformin hcl) ..... One  twice daily    Adult Aspirin Ec Low Strength 81 Mg Tbec (Aspirin) ..... Once daily    Novolog Mix 70/30 Flexpen 70-30 % Susp (Insulin aspart prot & aspart) .Marland Kitchen... 20 units a.m. and 15 units with dinner  subcutaneously injection  Complete Medication List: 1)  Caduet 5-40 Mg Tabs (Amlodipine-atorvastatin) .... One by mouth daily 2)  Freestyle Lancets Misc (Lancets) .... Follow package instructions 3)  Metformin Hcl 1000 Mg Tabs (Metformin hcl) .... One  twice daily 4)  Adult Aspirin Ec Low Strength 81 Mg Tbec (Aspirin) .... Once daily 5)  Freestyle Test Strp (Glucose blood) .... Use daily for glucose control 6)  Bystolic 20 Mg Tabs (Nebivolol hcl) .Marland Kitchen.. 1 by mouth daily 7)  Bd Pen Needle Short U/f 31g X 8 Mm Misc (Insulin pen needle) .... Use bid 8)  Novolog Mix 70/30 Flexpen 70-30 % Susp (Insulin aspart prot &  aspart) .... 20 units a.m. and 15 units with dinner  subcutaneously injection  Patient Instructions: 1)  Please schedule a follow-up appointment in 2 weeks. Prescriptions: NOVOLOG MIX 70/30 FLEXPEN 70-30 % SUSP (INSULIN ASPART PROT & ASPART) 20 units a.m. and 15 units with dinner  subcutaneously injection  #3 boxes x 3   Entered and Authorized by:   Birdie Sons MD   Signed by:   Birdie Sons MD on 07/16/2009   Method used:   Electronically to        [facility] [address] [phone]* (retail)       [address]       [place], [place]   [address]       Ph: [phone] or [phone]       Fax: [phone]   RxID:   [id number] BD PEN NEEDLE SHORT U/F 31G X 8 MM MISC (INSULIN PEN NEEDLE) use bid  #200 x 3   Entered and Authorized by:   Birdie Sons MD   Signed by:   Birdie Sons MD on 07/16/2009   Method used:   Electronically to        [facility] [address] [phone]* (retail)       [address]       [place], [place]  [address]       Ph: [phone] or [phone]       Fax: [phone]   RxID:   [id number] FREESTYLE TEST  STRP (GLUCOSE BLOOD) use daily for glucose control  #100 x 3   Entered and Authorized by:   Birdie Sons MD   Signed by:   Birdie Sons MD on 07/16/2009   Method used:   Electronically to        [facility] [address] [phone]* (retail)       [address]       [place], [place]  [address]       Ph: [phone] or [phone]       Fax: [phone]   RxID:   [id number] FREESTYLE LANCETS  MISC (LANCETS) follow package instructions  #100 x 3   Entered and Authorized by:   Birdie Sons MD   Signed by:   Birdie Sons MD on 07/16/2009   Method used:   Electronically to        [facility] [address] [phone]* (retail)       [address]       [place], [place]  [address]       Ph: [phone] or [phone]       Fax: [phone]   RxID:   [id number]    Immunization History:  Influenza Immunization History:    Influenza:  historical (02/20/2009)

## 2010-06-30 NOTE — Assessment & Plan Note (Signed)
Summary: 4 MTH ROV // RS   Vital Signs:  Patient profile:   60 year old male Weight:      221 pounds BMI:     31.82 Temp:     99.0 degrees F oral Pulse rate:   76 / minute Pulse rhythm:   regular BP sitting:   114 / 90  (left arm) Cuff size:   large  Vitals Entered By: [name] Levins, CMA (June 22, 2010 8:06 AM) CC: f/u   CC:  f/u.  History of Present Illness:  Follow-Up Visit      This is a 60 year old man who presents for Follow-up visit.  The patient denies chest pain and palpitations.  Since the last visit the patient notes no new problems or concerns.  The patient reports taking meds as prescribed.  When questioned about possible medication side effects, the patient notes none.  not monitoring BP or glucose. he admits to slow progress on weight loss.   All other systems reviewed and were negative   Current Problems (verified): 1)  Angioedema  (ICD-995.1) 2)  Obesity  (ICD-278.00) 3)  Sleep Apnea  (ICD-780.57) 4)  Hypertension  (ICD-401.9) 5)  Hyperlipidemia  (ICD-272.4) 6)  Diabetes Mellitus, Type II  (ICD-250.00)  Current Medications (verified): 1)  Simvastatin 20 Mg Tabs (Simvastatin) .... One By Mouth Daily  Allergies (verified): 1)  ! Tetanus Toxoid Fluid 2)  Tetanus Toxoid Adsorbed (Tetanus Toxoid Adsorbed)  Past History:  Past Medical History: Last updated: 11/20/2006 Diabetes mellitus, type II  12/04  CBG>300 OSA Hyperlipidemia Hypertension   allergic reaction to tetanus 01/05  Past Surgical History: Last updated: 08/20/2009 Appendectomy hand fx lap band 08/10/09  Social History: Last updated: 02/27/2007 Occupation: Married Regular exercise-no  Risk Factors: Exercise: no (02/27/2007)  Risk Factors: Smoking Status: current (07/16/2009) Cigars/wk: 0.5 (07/16/2009)  Physical Exam  General:  alert and well-developed.   Head:  normocephalic and atraumatic.   Eyes:  pupils equal and pupils round.   Ears:  R ear normal and L  ear normal.   Neck:  No deformities, masses, or tenderness noted. Lungs:  Normal respiratory effort, chest expands symmetrically. Lungs are clear to auscultation, no crackles or wheezes. Heart:  normal rate and regular rhythm.   Abdomen:  soft and non-tender.   Skin:  turgor normal and color normal.     Impression & Recommendations:  Problem # 1:  OBESITY (ICD-278.00) remains his most signifcant problem discussed exercise at length  Problem # 2:  HYPERTENSION (ICD-401.9) fair control continue current medications  discussed weight loss  BP today: 114/90 Prior BP: 122/82 (02/16/2010)  Prior 10 Yr Risk Heart Disease: 18 % (06/11/2007)  Labs Reviewed: K+: 4.3 (06/15/2010) Creat: : 0.9 (06/15/2010)   Chol: 164 (06/15/2010)   HDL: 39.30 (06/15/2010)   LDL: 106 (06/15/2010)   TG: 94.0 (06/15/2010)  Problem # 3:  DIABETES MELLITUS, TYPE II (ICD-250.00) no meds  discussed weight loss Labs Reviewed: Creat: 0.9 (06/15/2010)     Last Eye Exam: normal-pts' report (01/21/2010) Reviewed HgBA1c results: 7.4 (06/15/2010)  7.0 (02/16/2010)  Complete Medication List: 1)  Simvastatin 20 Mg Tabs (Simvastatin) .... One by mouth daily  Patient Instructions: 1)  3 months cpx with A1C   Orders Added: 1)  Est. Patient Level IV [09811]

## 2010-06-30 NOTE — Letter (Signed)
Summary: [facility]   Imported By: Maryln Gottron 06/22/2010 12:42:52  _____________________________________________________________________  External Attachment:    Type:   Image     Comment:   External Document

## 2010-07-15 ENCOUNTER — Encounter (HOSPITAL_COMMUNITY): Payer: 59

## 2010-07-15 ENCOUNTER — Other Ambulatory Visit (HOSPITAL_COMMUNITY): Payer: Self-pay | Admitting: Surgery

## 2010-07-15 ENCOUNTER — Other Ambulatory Visit: Payer: Self-pay | Admitting: Surgery

## 2010-07-15 ENCOUNTER — Ambulatory Visit (HOSPITAL_COMMUNITY)
Admission: RE | Admit: 2010-07-15 | Discharge: 2010-07-15 | Disposition: A | Discharge status: Home / Self Care | Payer: 59 | Source: Ambulatory Visit | Attending: Surgery | Admitting: Surgery

## 2010-07-15 DIAGNOSIS — Z79899 Other long term (current) drug therapy: Secondary | ICD-10-CM | POA: Insufficient documentation

## 2010-07-15 DIAGNOSIS — Z01810 Encounter for preprocedural cardiovascular examination: Secondary | ICD-10-CM | POA: Insufficient documentation

## 2010-07-15 DIAGNOSIS — F17200 Nicotine dependence, unspecified, uncomplicated: Secondary | ICD-10-CM | POA: Insufficient documentation

## 2010-07-15 DIAGNOSIS — Z01818 Encounter for other preprocedural examination: Secondary | ICD-10-CM

## 2010-07-15 DIAGNOSIS — E119 Type 2 diabetes mellitus without complications: Secondary | ICD-10-CM | POA: Insufficient documentation

## 2010-07-15 DIAGNOSIS — I10 Essential (primary) hypertension: Secondary | ICD-10-CM | POA: Insufficient documentation

## 2010-07-15 LAB — BASIC METABOLIC PANEL
BUN: 12 mg/dL (ref 6–23)
Chloride: 104 mEq/L (ref 96–112)
Glucose, Bld: 179 mg/dL — ABNORMAL HIGH (ref 70–99)
Potassium: 4.1 mEq/L (ref 3.5–5.1)
Sodium: 139 mEq/L (ref 135–145)

## 2010-07-15 LAB — CBC
HCT: 43.2 % (ref 39.0–52.0)
Hemoglobin: 13.8 g/dL (ref 13.0–17.0)
MCHC: 31.9 g/dL (ref 30.0–36.0)
RDW: 13.9 % (ref 11.5–15.5)
WBC: 5 10*3/uL (ref 4.0–10.5)

## 2010-07-15 LAB — SURGICAL PCR SCREEN: MRSA, PCR: NEGATIVE

## 2010-07-15 IMAGING — CR DG CHEST 2V
2 series · 2 of 2 positions shown · non-contrast
Comparison: [DATE].

CLINICAL DATA: Hypertension.  Diabetes.  Cigar smoker.
Preoperative cardiopulmonary evaluation.

CHEST - 2 VIEW

[w chest pa]
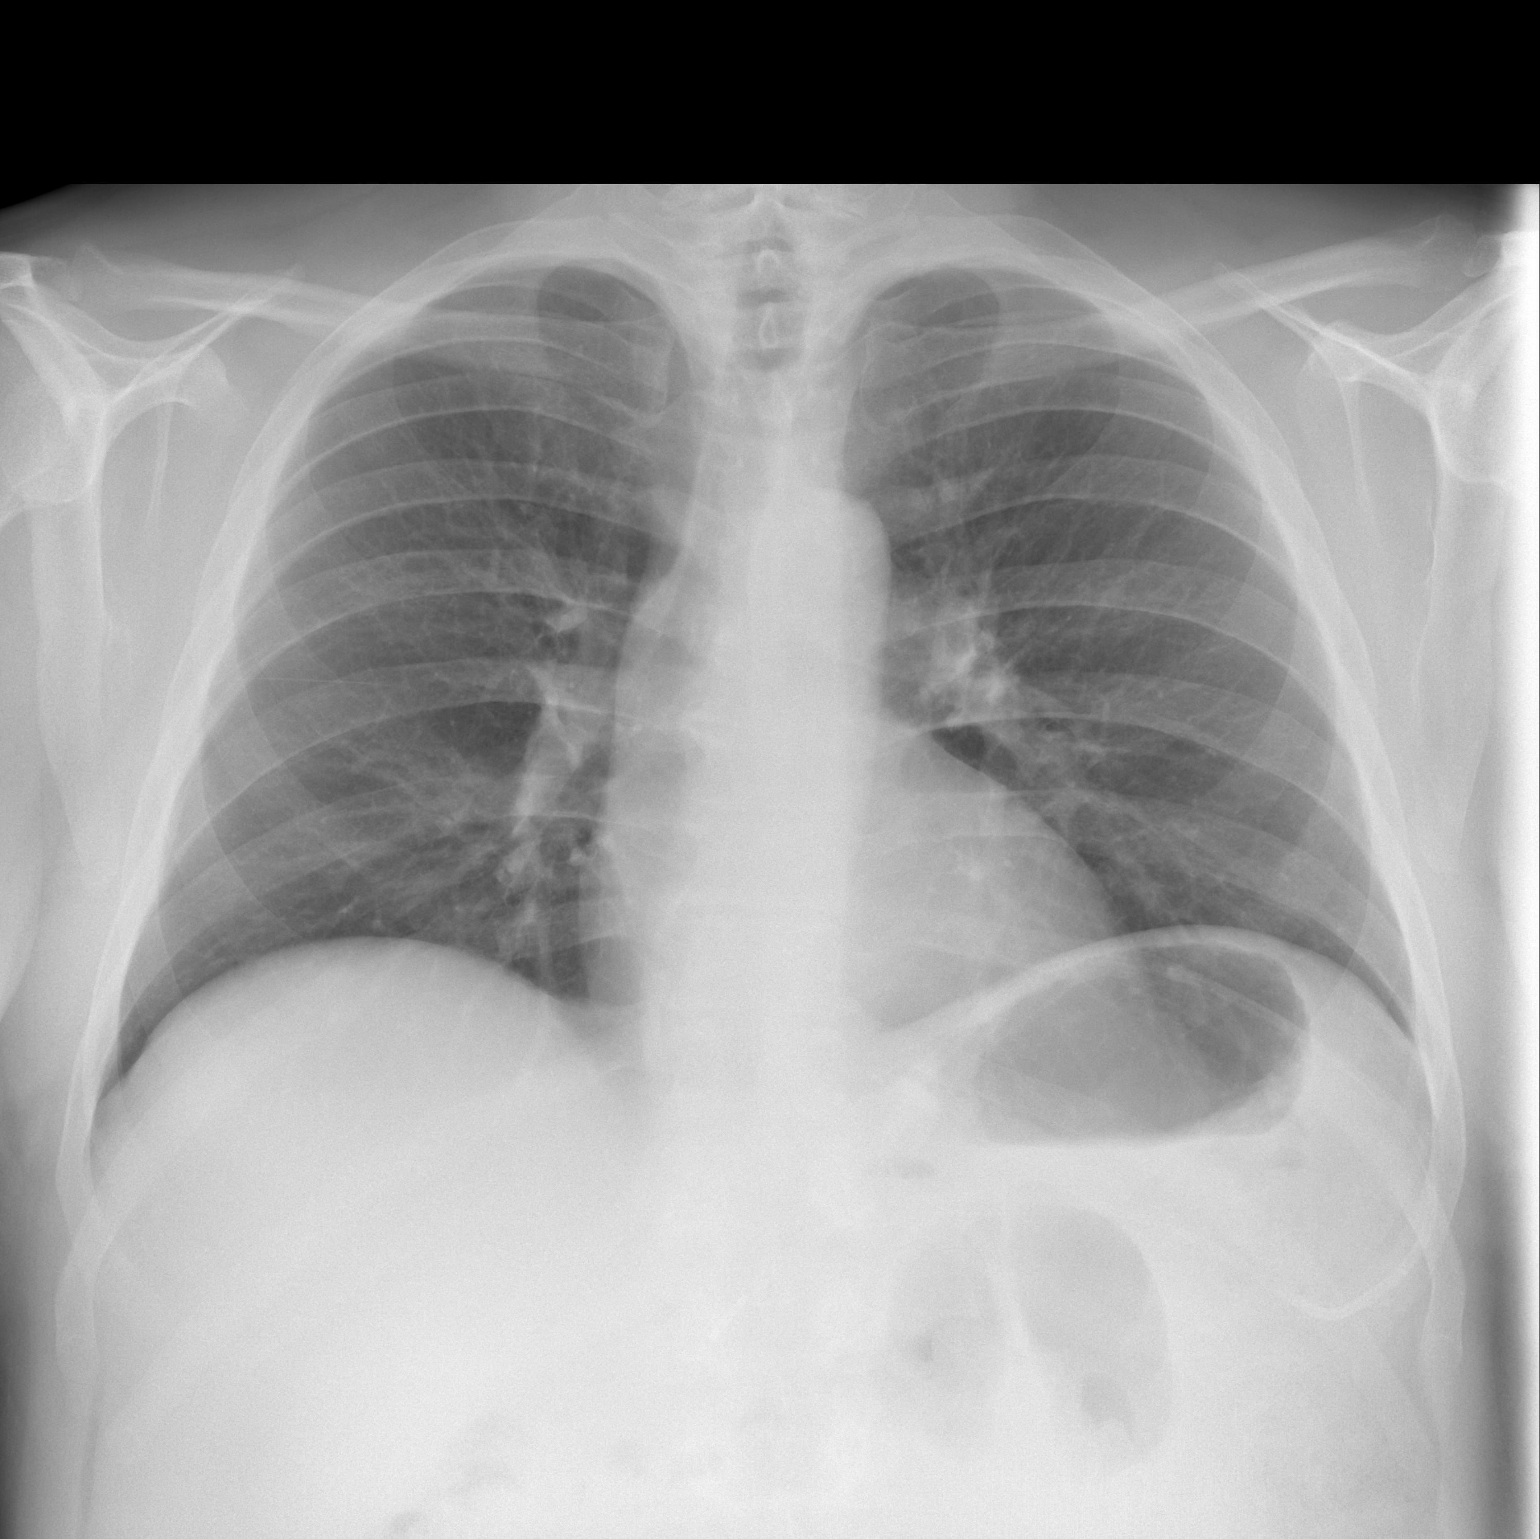

[w chest lat]
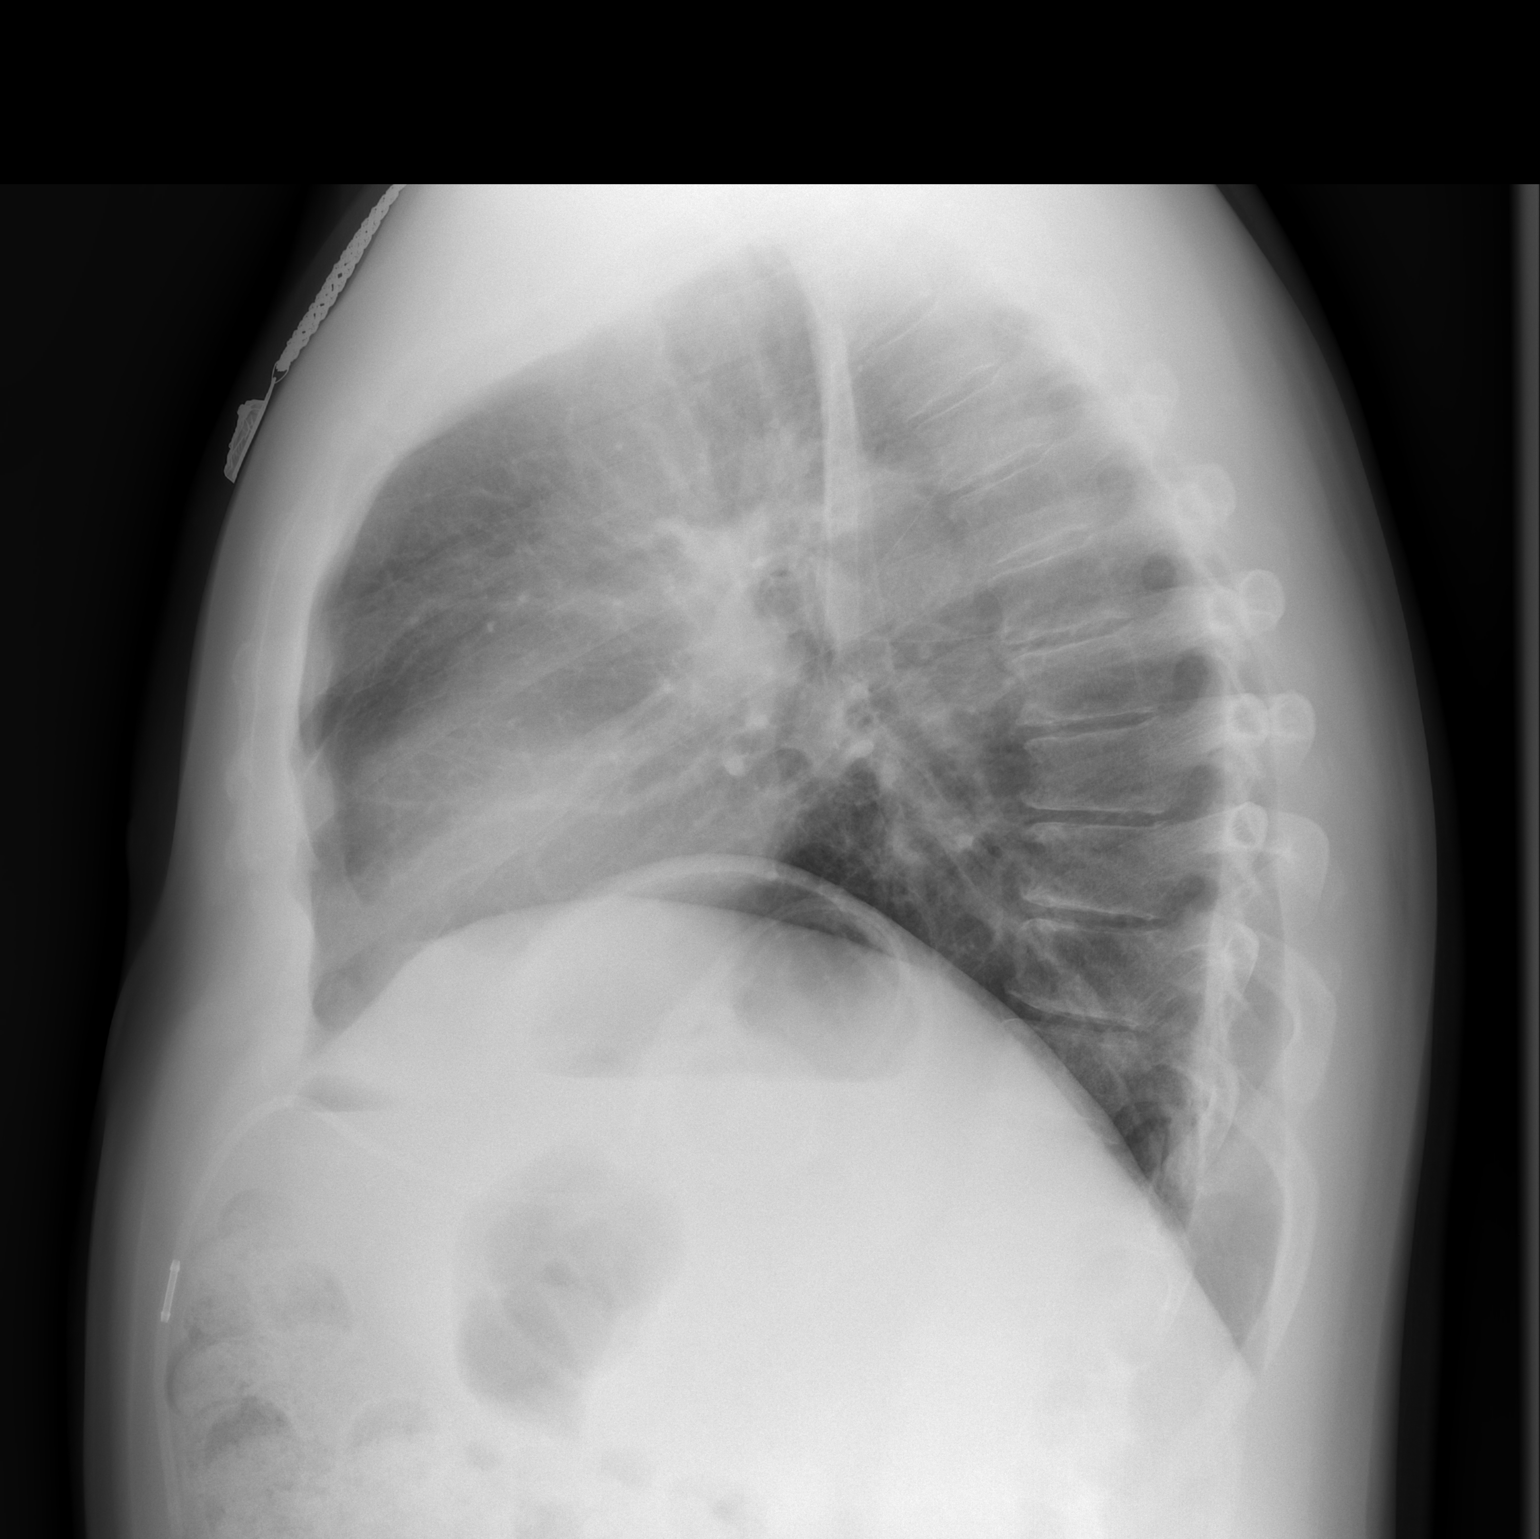

[2 of 2 positions shown; findings below may reference images not displayed]

FINDINGS: The cardiac silhouette is normal size and shape. The
lungs are well aerated and free of infiltrates. No pleural
abnormality is evident.  No hilar mediastinal lesion is seen.
There is slight scoliosis with minimal degenerative spondylosis.
IMPRESSION: No acute or active cardiopulmonary abnormality is seen.  Stable
appearance of chest.

## 2010-07-20 ENCOUNTER — Ambulatory Visit (HOSPITAL_COMMUNITY)
Admission: RE | Admit: 2010-07-20 | Discharge: 2010-07-20 | Disposition: A | Discharge status: Home / Self Care | Payer: 59 | Source: Ambulatory Visit | Attending: Surgery | Admitting: Surgery

## 2010-07-20 DIAGNOSIS — I10 Essential (primary) hypertension: Secondary | ICD-10-CM | POA: Insufficient documentation

## 2010-07-20 DIAGNOSIS — Z6834 Body mass index (BMI) 34.0-34.9, adult: Secondary | ICD-10-CM | POA: Insufficient documentation

## 2010-07-20 DIAGNOSIS — Y838 Other surgical procedures as the cause of abnormal reaction of the patient, or of later complication, without mention of misadventure at the time of the procedure: Secondary | ICD-10-CM | POA: Insufficient documentation

## 2010-07-20 DIAGNOSIS — K9509 Other complications of gastric band procedure: Secondary | ICD-10-CM | POA: Insufficient documentation

## 2010-07-20 DIAGNOSIS — E119 Type 2 diabetes mellitus without complications: Secondary | ICD-10-CM | POA: Insufficient documentation

## 2010-07-20 DIAGNOSIS — E669 Obesity, unspecified: Secondary | ICD-10-CM | POA: Insufficient documentation

## 2010-07-20 LAB — GLUCOSE, CAPILLARY

## 2010-08-03 NOTE — Op Note (Signed)
Raymond Vasquez, Raymond Vasquez                 ACCOUNT NO.:  [id number]  MEDICAL RECORD NO.:  [id number]           PATIENT TYPE:  O  LOCATION:  DAYL                         FACILITY:  [facility]  PHYSICIAN:  Sandria Bales. Ezzard Standing, M.D.  DATE OF BIRTH:  13-Jul-1950  DATE OF PROCEDURE:  07/20/2010                              OPERATIVE REPORT   PREOPERATIVE DIAGNOSIS:  Malfunction/prominence of right upper quadrant lap band port.  POSTOPERATIVE DIAGNOSIS:  Malfunction/prominence of right upper quadrant lap band port.  PROCEDURE:  Revision of lap band port with an Allergan low profile port.  SURGEON:  Sandria Bales. Ezzard Standing, MD  FIRST ASSISTANT:  None.  ANESTHESIA:  General endotracheal.  ESTIMATED BLOOD LOSS:  Minimal.  INDICATION FOR PROCEDURE:  Mr. [name] is a 60 year old male, patient of Dr. Birdie Sons, who had an AP standard lap band placement made on August 10, 2009.  He has lost approximately 30 pounds of weight to a BMI of 34.6, weight of 220, but as he has lost weight, his port has become more prominent and bothering him more.  He wears bullet proof vest as part of his job and this puts pressure on his prominent port.  Because of this, I will try to both move the port and try to put a lower profile port for him.  The indications and potential complications of port revision were explained to the patient.  Potential complications include bleeding, infection, and the possibility he still may have trouble with this port despite it being a different and being a lower profile.  OPERATIVE NOTE:  The patient is placed in a supine position, given general endotracheal anesthetic.  His abdomen was prepped with ChloraPrep and sterilely draped.  Made an incision over the port.    We opened the new low-profile port.  There was a problem with opening the packaging, but figured out what was sterile.  I was able to explant the old port with the entire mesh on the backing. I then made an incision  approximately 5 cm lateral to where the old port had been and developed a pocket down to the rectus fascia.  I then took the new low profile port.  I flushed it with saline.  I pulled the tubing subcutaneously to this new port area.  I placed a mesh backing on the port and laid this against the abdominal wall.  I then irrigated the wounds.  The subcutaneous tissue was closed with 3- 0 Vicryl suture.  The skin was closed with 5-0 Vicryl suture.  Wound was painted with Dermabond, sterilely dressed.  The patient tolerated the procedure well, will be discharged home today, will see me in back for followup wound check.  I did caution him he may lose some volume out of this fluid, and I am not going to try to adjust this for at least 4-6 weeks until this wound is entirely healed.   Sandria Bales. Ezzard Standing, M.D., FACS   DHN/MEDQ  D:  07/20/2010  T:  07/20/2010  Job:  [id number]  cc:   Valetta Mole. Swords, MD [address] [place] [address]  Electronically Signed by Ovidio Kin M.D. on 08/03/2010 07:32:57 PM

## 2010-08-16 LAB — CBC
HCT: 40.9 % (ref 39.0–52.0)
HCT: 43.8 % (ref 39.0–52.0)
MCV: 87.2 fL (ref 78.0–100.0)
MCV: 87.6 fL (ref 78.0–100.0)
Platelets: 243 10*3/uL (ref 150–400)
Platelets: 266 10*3/uL (ref 150–400)
RBC: 5.01 MIL/uL (ref 4.22–5.81)
RDW: 14.1 % (ref 11.5–15.5)

## 2010-08-16 LAB — GLUCOSE, CAPILLARY
Glucose-Capillary: 104 mg/dL — ABNORMAL HIGH (ref 70–99)
Glucose-Capillary: 107 mg/dL — ABNORMAL HIGH (ref 70–99)
Glucose-Capillary: 115 mg/dL — ABNORMAL HIGH (ref 70–99)
Glucose-Capillary: 141 mg/dL — ABNORMAL HIGH (ref 70–99)
Glucose-Capillary: 154 mg/dL — ABNORMAL HIGH (ref 70–99)
Glucose-Capillary: 157 mg/dL — ABNORMAL HIGH (ref 70–99)

## 2010-08-16 LAB — DIFFERENTIAL
Basophils Absolute: 0 10*3/uL (ref 0.0–0.1)
Eosinophils Absolute: 0.1 10*3/uL (ref 0.0–0.7)
Eosinophils Absolute: 0.2 10*3/uL (ref 0.0–0.7)
Eosinophils Relative: 1 % (ref 0–5)
Eosinophils Relative: 3 % (ref 0–5)
Lymphocytes Relative: 10 % — ABNORMAL LOW (ref 12–46)
Lymphs Abs: 1.1 10*3/uL (ref 0.7–4.0)
Neutrophils Relative %: 80 % — ABNORMAL HIGH (ref 43–77)

## 2010-08-16 LAB — COMPREHENSIVE METABOLIC PANEL
ALT: 30 U/L (ref 0–53)
AST: 22 U/L (ref 0–37)
Albumin: 4.1 g/dL (ref 3.5–5.2)
Alkaline Phosphatase: 55 U/L (ref 39–117)
Calcium: 9.5 mg/dL (ref 8.4–10.5)
Chloride: 110 mEq/L (ref 96–112)
Creatinine, Ser: 0.84 mg/dL (ref 0.4–1.5)
GFR calc Af Amer: >60 mL/min (ref >60)
Glucose, Bld: 100 mg/dL — ABNORMAL HIGH (ref 70–99)

## 2010-10-08 NOTE — Op Note (Signed)
NAME:  Raymond Vasquez, Raymond Vasquez                           ACCOUNT NO.:  [id number]   MEDICAL RECORD NO.:  [id number]                   PATIENT TYPE:  OIB   LOCATION:  NA                                   FACILITY:  [facility]   PHYSICIAN:  Kristine Garbe. Ezzard Standing, M.D.         DATE OF BIRTH:  1951/03/13   DATE OF PROCEDURE:  02/04/2004  DATE OF DISCHARGE:                                 OPERATIVE REPORT   PREOPERATIVE DIAGNOSES:  1.  Obstructive sleep apnea.  2.  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   POSTOPERATIVE DIAGNOSES:  1.  Obstructive sleep apnea.  2.  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   OPERATION PERFORMED:  Uvulopalatopharyngoplasty.  Septoplasty with bilateral  inferior turbinate reductions.   SURGEON:  Kristine Garbe. Ezzard Standing, M.D.   ANESTHESIA:  General endotracheal.   COMPLICATIONS:  None.   INDICATIONS FOR PROCEDURE:  Raymond Vasquez is a 60 year old gentleman who has  obstructive sleep apnea.  On sleep test, he had an RDI of 43, an oxygen  saturation down in the 60s.  He presently is using nasal CPAP but does not  tolerate this well, mostly because nasal congestion.  On examination, he has  a rather significant septal deviation to the left with large turbinates.  On  oral exam he is status post tonsillectomy as child.  Has a moderate size  uvula and palate.  He is taken to the operating room at this time for  septoplasty and turbinate reduction to help improve his nasal airway as well  as uvulopalatopharyngoplasty to help reduce the obstructive symptoms.  He  has also had some weight gain in the last several months and we discussed  concerning weight loss.   DESCRIPTION OF PROCEDURE:  After adequate endotracheal anesthesia, the  patient received 10 mg of Decadron and 1 g Ancef IV preoperatively.  The  nose was prepped first with cotton pledgets soaked in decongestant.  The  septum and  turbinates were then injected with Xylocaine with epinephrine.  A  hemitransfixion incision was made along the septum on the left side.  The  patient had a fractured cartilaginous septum anteriorly with the very tip of  the cartilage protruding into the right airway but more posteriorly the bulk  of the cartilaginous septum protruded to the left airway.  In addition had  bony deviation to the left.  The hemitransfixion incision was extended down  to the perichondrium.  The perichondrium was elevated off of the septum on  the left side, back to the bony periosteum.  The deviated cartilaginous  septum was excised along the floor of the nose.  The maxillary crest was  fractured to allow the septum to return more to midline.  In addition,  mucoperiosteum was elevated beside the bony septum more posteriorly and this  was  removed to allow the septum to return more to midline.  This  straightened the septum up.  Following this, turbinate reductions were  performed.  On the left side the smaller turbinate, the lower one half of  the turbinate was amputated.  Suction cautery was used for hemostasis.  On  the right side, the larger turbinate again was amputated.  The lower one  half of the turbinate was amputated.  Suction cautery was used for  hemostasis. The remaining turbinate bone was outfractured on the right side.  The patient also had some polypoid changes of the middle turbinate  bilaterally.  Using straight through cut and suction cautery, the polypoid  changes of the middle turbinate were removed.  Middle meatus itself was  clear.  This completed the procedure.  The hemitransfixion incision was  closed with 3-0 and 4-0 chromic sutures.  Telfa soaked in the bacitracin  ointment were placed on either side of the septum for hemostasis .  Next,  the mouth gag was used to expose the oropharynx.  The patient is status post  tonsillectomy.  The distal 1 cm of soft palate and uvula were then  amputated  and the mucosal edges of the palate were reapproximated with 4-0 chromic and  4-0 chromic sutures.  This completed the uvulopalatopharyngoplasty.  The  patient was subsequently awakened from anesthesia and transferred to  recovery postoperatively doing well.   DISPOSITION:  Wilberto will be overnight in the intensive care unit and plan  discharge in the morning if he has no airway problems after removing nasal  packs.   DISCHARGE MEDICATIONS:  1.  Lortab elixir 15 to 30 mL every four hours as needed for pain.  2.  Amoxicillin suspension 400 mg twice daily for one week.   FOLLOW UP:  Have him follow up in one week for recheck.                                               Kristine Garbe. Ezzard Standing, M.D.    CEN/MEDQ  D:  02/04/2004  T:  02/04/2004  Job:  [id number]   cc:   Valetta Mole. Swords, M.D. [facility]

## 2010-12-17 ENCOUNTER — Other Ambulatory Visit: Payer: Self-pay | Admitting: Internal Medicine

## 2011-03-02 ENCOUNTER — Other Ambulatory Visit: Payer: Self-pay | Admitting: Internal Medicine

## 2011-03-30 ENCOUNTER — Other Ambulatory Visit: Payer: Self-pay | Admitting: Internal Medicine

## 2011-05-13 ENCOUNTER — Other Ambulatory Visit: Payer: Self-pay | Admitting: Internal Medicine

## 2011-07-01 ENCOUNTER — Encounter: Payer: Self-pay | Admitting: Internal Medicine

## 2011-07-14 ENCOUNTER — Ambulatory Visit (INDEPENDENT_AMBULATORY_CARE_PROVIDER_SITE_OTHER): Payer: 59 | Admitting: Internal Medicine

## 2011-07-14 VITALS — BP 112/70 | HR 72 | Temp 98.2°F | Wt 217.0 lb

## 2011-07-14 DIAGNOSIS — K649 Unspecified hemorrhoids: Secondary | ICD-10-CM

## 2011-07-14 DIAGNOSIS — Z23 Encounter for immunization: Secondary | ICD-10-CM

## 2011-07-14 MED ORDER — METFORMIN HCL 500 MG PO TABS: 500.0000 mg | ORAL_TABLET | Freq: Two times a day (BID) | ORAL | Status: DC

## 2011-07-14 MED ORDER — HYDROCORTISONE 2.5 % RE CREA: TOPICAL_CREAM | Freq: Two times a day (BID) | RECTAL | Status: AC

## 2011-07-14 NOTE — Progress Notes (Signed)
Patient ID: Raymond Vasquez, male   DOB: 1951/02/17, 61 y.o.   MRN: [id number]  Rectal irritation for two days---feels a small lump  DM--not following a diet or exercise plan. CBGs---up to 260  No past medical history on file.  History   Social History  . Marital Status: Married    Spouse Name: N/A    Number of Children: N/A  . Years of Education: N/A   Occupational History  . Not on file.   Social History Main Topics  . Smoking status: Not on file  . Smokeless tobacco: Not on file  . Alcohol Use: Not on file  . Drug Use: Not on file  . Sexually Active: Not on file   Other Topics Concern  . Not on file   Social History Narrative  . No narrative on file    No past surgical history on file.  No family history on file.  Allergies  Allergen Reactions  . Tetanus Toxoid Adsorbed     REACTION: unspecified    Current Outpatient Prescriptions on File Prior to Visit  Medication Sig Dispense Refill  . ONE TOUCH ULTRA TEST test strip USE DAILY FOR GLUCOSE CONTROL  100 each  2  . ONETOUCH DELICA LANCETS MISC USE DAILY FOR BLUCOSE CONTROL  100 each  11  . simvastatin (ZOCOR) 20 MG tablet TAKE 1 TABLET BY MOUTH EVERY DAY  90 tablet  0     patient denies chest pain, shortness of breath, orthopnea. Denies lower extremity edema, abdominal pain, change in appetite, change in bowel movements. Patient denies rashes, musculoskeletal complaints. No other specific complaints in a complete review of systems.   BP 112/70  Pulse 72  Temp(Src) 98.2 F (36.8 C) (Oral)  Wt 217 lb (98.431 kg)  SpO2 95%  well-developed well-nourished male in no acute distress. HEENT exam atraumatic, normocephalic, neck supple without jugular venous distention. SMALL HEMORRHOID AT 3:00 POSITION.   A/P: hemorrhoid--anusol hc  CM--resume metformin

## 2011-09-07 ENCOUNTER — Other Ambulatory Visit: Payer: Self-pay | Admitting: Internal Medicine

## 2011-09-22 ENCOUNTER — Encounter (INDEPENDENT_AMBULATORY_CARE_PROVIDER_SITE_OTHER): Payer: Self-pay

## 2011-10-11 ENCOUNTER — Ambulatory Visit: Payer: 59 | Admitting: Internal Medicine

## 2011-12-13 ENCOUNTER — Ambulatory Visit (INDEPENDENT_AMBULATORY_CARE_PROVIDER_SITE_OTHER): Payer: 59 | Admitting: Family

## 2011-12-13 ENCOUNTER — Encounter: Payer: Self-pay | Admitting: Family

## 2011-12-13 VITALS — BP 112/70 | HR 100 | Temp 98.3°F | Wt 210.0 lb

## 2011-12-13 DIAGNOSIS — E119 Type 2 diabetes mellitus without complications: Secondary | ICD-10-CM

## 2011-12-13 DIAGNOSIS — B372 Candidiasis of skin and nail: Secondary | ICD-10-CM

## 2011-12-13 MED ORDER — FLUCONAZOLE 150 MG PO TABS: 150.0000 mg | ORAL_TABLET | Freq: Once | ORAL | Status: AC

## 2011-12-13 MED ORDER — CLOTRIMAZOLE-BETAMETHASONE 1-0.05 % EX CREA: TOPICAL_CREAM | Freq: Two times a day (BID) | CUTANEOUS | Status: AC

## 2011-12-13 NOTE — Patient Instructions (Signed)
Yeast Infection of the Skin  Some yeast on the skin is normal, but sometimes it causes an infection. If you have a yeast infection, it shows up as white or light brown patches on brown skin. You can see it better in the summer on tan skin. It causes light-colored holes in your suntan. It can happen on any area of the body. This cannot be passed from person to person.  HOME CARE   Scrub your skin daily with a dandruff shampoo. Your rash may take a couple weeks to get well.    Do not scratch or itch the rash.   GET HELP RIGHT AWAY IF:     You get another infection from scratching. The skin may get warm, red, and may ooze fluid.    The infection does not seem to be getting better.   MAKE SURE YOU:   Understand these instructions.    Will watch your condition.    Will get help right away if you are not doing well or get worse.   Document Released: 04/21/2008 Document Revised: 04/28/2011 Document Reviewed: 04/21/2008  Exit[employer] Patient Information 2012 [employer].

## 2011-12-14 ENCOUNTER — Encounter: Payer: Self-pay | Admitting: Family

## 2011-12-14 NOTE — Progress Notes (Signed)
  Subjective:    Patient ID: Raymond Vasquez, male    DOB: 10-28-1950, 61 y.o.   MRN: [id number]  HPI 61 year old male, nonsmoker, patient of Dr. Cato Mulligan is in today with irritation to his penis, underneath the foreskin and between his gluteal folds. It isn't present for several days, described as burning. No drainage or discharge. Does a lot of work outside and sweating. Reports wife having a yeast infection about 2 months ago. Denies any concerns any sexually transmitted diseases.   Review of Systems  Constitutional: Negative.   Respiratory: Negative.   Cardiovascular: Negative.   Skin: Positive for rash.       Rash under the foreskin and between his gluteal folds  Neurological: Negative.   Hematological: Negative.   Psychiatric/Behavioral: Negative.    No past medical history on file.  History   Social History  . Marital Status: Married    Spouse Name: N/A    Number of Children: N/A  . Years of Education: N/A   Occupational History  . Not on file.   Social History Main Topics  . Smoking status: Current Some Day Smoker    Types: Cigars  . Smokeless tobacco: Current User  . Alcohol Use: Not on file  . Drug Use: Not on file  . Sexually Active: Not on file   Other Topics Concern  . Not on file   Social History Narrative  . No narrative on file    No past surgical history on file.  No family history on file.  No Known Allergies  Current Outpatient Prescriptions on File Prior to Visit  Medication Sig Dispense Refill  . metFORMIN (GLUCOPHAGE) 500 MG tablet Take 1 tablet (500 mg total) by mouth 2 (two) times daily with a meal.  180 tablet  3  . ONE TOUCH ULTRA TEST test strip USE DAILY FOR GLUCOSE CONTROL  100 each  2  . ONETOUCH DELICA LANCETS MISC USE DAILY FOR BLUCOSE CONTROL  100 each  11  . simvastatin (ZOCOR) 20 MG tablet TAKE 1 TABLET BY MOUTH EVERY DAY  90 tablet  0    BP 112/70  Pulse 100  Temp 98.3 F (36.8 C) (Oral)  Wt 210 lb (95.255 kg)  SpO2  98%chart    Objective:   Physical Exam  Constitutional: He is oriented to person, place, and time. He appears well-developed and well-nourished.  Cardiovascular: Normal rate, regular rhythm and normal heart sounds.   Pulmonary/Chest: Effort normal and breath sounds normal.  Neurological: He is alert and oriented to person, place, and time.  Skin: Skin is warm and dry.       Retraction of the foreskin reveals a red, irritated rash, that is tender to palpation. Similar rash noted between the gluteal folds. Consistent with skin Candida  Psychiatric: He has a normal mood and affect.          Assessment & Plan:  Assessment: Candida scan, type 2 diabetes  Plan: Diflucan 150 mg by mouth x1. Lotrisone cream applied to the affected area twice daily. Patient call the office symptoms worsen or persist. Recheck a schedule, when necessary.

## 2011-12-22 ENCOUNTER — Other Ambulatory Visit: Payer: Self-pay | Admitting: Internal Medicine

## 2012-02-29 ENCOUNTER — Other Ambulatory Visit: Payer: Self-pay | Admitting: Internal Medicine

## 2012-03-07 ENCOUNTER — Other Ambulatory Visit: Payer: Self-pay | Admitting: Internal Medicine

## 2012-04-04 ENCOUNTER — Other Ambulatory Visit: Payer: Self-pay | Admitting: Internal Medicine

## 2012-06-09 ENCOUNTER — Other Ambulatory Visit: Payer: Self-pay | Admitting: Internal Medicine

## 2012-06-23 HISTORY — PX: FINGER SURGERY: SHX640

## 2012-07-18 ENCOUNTER — Other Ambulatory Visit: Payer: Self-pay | Admitting: Internal Medicine

## 2012-07-23 ENCOUNTER — Other Ambulatory Visit: Payer: Self-pay | Admitting: Internal Medicine

## 2013-01-23 ENCOUNTER — Other Ambulatory Visit: Payer: Self-pay | Admitting: Internal Medicine

## 2013-02-12 ENCOUNTER — Other Ambulatory Visit: Payer: Self-pay | Admitting: Internal Medicine

## 2013-02-18 ENCOUNTER — Telehealth: Payer: Self-pay | Admitting: Internal Medicine

## 2013-02-18 MED ORDER — GLUCOSE BLOOD VI STRP: ORAL_STRIP | Status: DC

## 2013-02-18 NOTE — Telephone Encounter (Signed)
Pt needs refill of ONE TOUCH ULTRA TEST test strip Pt has appt for 11/21 for cpe and was told no refill until appt was made. [facility]/ [place]

## 2013-02-18 NOTE — Telephone Encounter (Signed)
Patient Information:  Caller Name: [place]lan  Phone: [phone]  Patient: Raymond Vasquez, Raymond Vasquez  Gender: Male  DOB: 27-Mar-1951  Age: 62 Years  PCP: Birdie Sons (Adults only)  Office Follow Up:  Does the office need to follow up with this patient?: No  Instructions For The Office: N/A  RN Note:  Denies any symptoms. Is just a fw streaks on tissue when wipes.  Symptoms  Reason For Call & Symptoms: Has noticed blood when wiped from having stool since "last week". Has history of hemorroid but states this seems "inside" as cannot feel hemorroid.  Reviewed Health History In EMR: Yes  Reviewed Medications In EMR: Yes  Reviewed Allergies In EMR: Yes  Reviewed Surgeries / Procedures: Yes  Date of Onset of Symptoms: 02/11/2013  Guideline(s) Used:  Rectal Bleeding  Disposition Per Guideline:   See Within 2 Weeks in Office  Reason For Disposition Reached:   Rectal bleeding is minimal (e.g., blood just on toilet paper, a few drops in toilet bowl)  Advice Given:  N/A  Patient Will Follow Care Advice:  YES  Appointment Scheduled:  02/21/2013 09:15:00 Appointment Scheduled Provider:  Berniece Andreas (Family Practice)

## 2013-02-18 NOTE — Telephone Encounter (Signed)
rx sent in electronically 

## 2013-02-21 ENCOUNTER — Ambulatory Visit (INDEPENDENT_AMBULATORY_CARE_PROVIDER_SITE_OTHER): Payer: 59 | Admitting: Internal Medicine

## 2013-02-21 ENCOUNTER — Encounter: Payer: Self-pay | Admitting: Internal Medicine

## 2013-02-21 ENCOUNTER — Encounter: Payer: Self-pay | Admitting: Gastroenterology

## 2013-02-21 ENCOUNTER — Encounter: Payer: Self-pay | Admitting: *Deleted

## 2013-02-21 ENCOUNTER — Telehealth: Payer: Self-pay | Admitting: Internal Medicine

## 2013-02-21 VITALS — BP 116/70 | HR 70 | Temp 98.2°F | Wt 210.0 lb

## 2013-02-21 DIAGNOSIS — K625 Hemorrhage of anus and rectum: Secondary | ICD-10-CM

## 2013-02-21 DIAGNOSIS — B3742 Candidal balanitis: Secondary | ICD-10-CM

## 2013-02-21 DIAGNOSIS — B3749 Other urogenital candidiasis: Secondary | ICD-10-CM

## 2013-02-21 MED ORDER — HYDROCORTISONE 2.5 % EX CREA: TOPICAL_CREAM | Freq: Two times a day (BID) | CUTANEOUS | Status: DC

## 2013-02-21 NOTE — Progress Notes (Signed)
Chief Complaint  Patient presents with  . Rectal Bleeding    Has blood on the tissue when he wipes after resttoom use.  Has a hx of hemorrhoids.  Believes the fungus in his penis has returned.  Is having cracking in his foreskin.  Would like a refill of his Lotrisone cream that was given to him by Adline Mango.    HPI: Patient comes in today for SDA for  new problem evaluation. pcp Dr Kathie Rhodes hasnt  seen  for a while.  Has cpx in November   However recently concern because of blood on tissue when wiped after bm for 2 days this week  None now no blood and no constipation per se .  Has hx of Hemorrhoids but not there . This time  Feels pressure from waist down  No pain abd  Daily  No change in bowel habits.  Has recurrent  Rash ans cracking of foreskin  rx with lotrisone in past and recurrent some left  ? Other rx   Has  Dm ? Control ok ?  No fever urinary sx .  ROS: See pertinent positives and negatives per HPI.  Past Medical History  Diagnosis Date  . Diabetes mellitus type II, controlled, with no complications   . OSA (obstructive sleep apnea)   . Hyperlipidemia   . Hypertension     No family history on file.  History   Social History  . Marital Status: Married    Spouse Name: N/A    Number of Children: N/A  . Years of Education: N/A   Social History Main Topics  . Smoking status: Current Some Day Smoker    Types: Cigars  . Smokeless tobacco: Current User  . Alcohol Use: None  . Drug Use: None  . Sexual Activity: None   Other Topics Concern  . None   Social History Narrative  . None    Outpatient Encounter Prescriptions as of 02/21/2013  Medication Sig Dispense Refill  . glucose blood (ONE TOUCH ULTRA TEST) test strip USE DAILY FOR GLUCOSE CONTROL  100 each  0  . metFORMIN (GLUCOPHAGE) 500 MG tablet TAKE 1 TABLET BY MOUTH TWICE DAILY  180 tablet  0  . ONETOUCH DELICA LANCETS FINE MISC USE DAILY  100 each  10  . simvastatin (ZOCOR) 20 MG tablet TAKE 1 TABLET BY  MOUTH EVERY DAY  90 tablet  0  . hydrocortisone 2.5 % cream Apply topically 2 (two) times daily.  30 g  0   No facility-administered encounter medications on file as of 02/21/2013.    EXAM:  BP 116/70  Pulse 70  Temp(Src) 98.2 F (36.8 C) (Oral)  Wt 210 lb (95.255 kg)  BMI 30.13 kg/m2  SpO2 98%  Body mass index is 30.13 kg/(m^2).  GENERAL: vitals reviewed and listed above, alert, oriented, appears well hydrated and in no acute distress HEENT: atraumatic, conjunctiva  clear, no obvious abnormalities on inspection of external nose and ears NECK: no obvious masses on inspection palpation  Abdomen:  Sof,t normal bowel sounds without hepatosplenomegaly, no guarding rebound or masses no CVA tenderness Anal; no obv lesion one small tag   Rectal prostate 1+ no nodules no masses minimal smear brown  neg hemoccult . MS: moves all extremities without noticeable focal  Abnormality Skin: normal capillary refill ,turgor , color: No acute rashes ,petechiae or bruising PSYCH: pleasant and cooperative, no obvious depression or anxiety  ASSESSMENT AND PLAN:  Discussed the following assessment and plan:  Anal bleeding - needs evaluation overdue for cancer screening also  . no systemic sx will do referral - Plan: Ambulatory referral to Gastroenterology  Candidal balanitis hx   - not examined today recurrent waxing and waning  prob balanitis  As described before   Although not examined today   Would try otc  Azole  Bid wit less potenet hcs and fu if  persistent or progressive blood sugar control also  Fu appt if needed or with dr Cato Mulligan.  Hemodynamically stable  Due for colon anyway   -Patient advised to return or notify health care team  if symptoms worsen or persist or new concerns arise.  Patient Instructions  Will do a referral to GI  About the blood  But no masses noted today  You will be contacted about this appt.    Also yeast can occur with mpoist enviroments and if sugars are high    Can use  otc monistat  Or lotrimin  Twice a day  For 1-2 weeks  Can add  Steroid cream lower potency than lortisone.  Fu with dr swords about this .       Neta Mends. Eran Mistry M.D.

## 2013-02-21 NOTE — Telephone Encounter (Signed)
Patient will come tomorrow and see Amy Esterwood PA at 10;00

## 2013-02-21 NOTE — Patient Instructions (Addendum)
Will do a referral to GI  About the blood  But no masses noted today  You will be contacted about this appt.    Also yeast can occur with mpoist enviroments and if sugars are high   Can use  otc monistat  Or lotrimin  Twice a day  For 1-2 weeks  Can add  Steroid cream lower potency than lortisone.  Fu with dr swords about this .

## 2013-02-22 ENCOUNTER — Ambulatory Visit: Payer: 59 | Admitting: Physician Assistant

## 2013-03-18 ENCOUNTER — Encounter: Payer: Self-pay | Admitting: Internal Medicine

## 2013-03-18 ENCOUNTER — Emergency Department (HOSPITAL_COMMUNITY): Payer: 59

## 2013-03-18 ENCOUNTER — Emergency Department (HOSPITAL_COMMUNITY)
Admission: EM | Admit: 2013-03-18 | Discharge: 2013-03-18 | Disposition: A | Discharge status: Home / Self Care | Payer: 59 | Attending: Emergency Medicine | Admitting: Emergency Medicine

## 2013-03-18 ENCOUNTER — Encounter: Payer: Self-pay | Admitting: Family Medicine

## 2013-03-18 ENCOUNTER — Encounter (HOSPITAL_COMMUNITY): Payer: Self-pay | Admitting: Emergency Medicine

## 2013-03-18 ENCOUNTER — Ambulatory Visit (INDEPENDENT_AMBULATORY_CARE_PROVIDER_SITE_OTHER): Payer: 59 | Admitting: Family Medicine

## 2013-03-18 VITALS — BP 150/94 | Temp 98.4°F | Wt 211.0 lb

## 2013-03-18 DIAGNOSIS — Z79899 Other long term (current) drug therapy: Secondary | ICD-10-CM | POA: Insufficient documentation

## 2013-03-18 DIAGNOSIS — I10 Essential (primary) hypertension: Secondary | ICD-10-CM | POA: Insufficient documentation

## 2013-03-18 DIAGNOSIS — E785 Hyperlipidemia, unspecified: Secondary | ICD-10-CM | POA: Insufficient documentation

## 2013-03-18 DIAGNOSIS — Z8669 Personal history of other diseases of the nervous system and sense organs: Secondary | ICD-10-CM | POA: Insufficient documentation

## 2013-03-18 DIAGNOSIS — B029 Zoster without complications: Secondary | ICD-10-CM | POA: Insufficient documentation

## 2013-03-18 DIAGNOSIS — F17200 Nicotine dependence, unspecified, uncomplicated: Secondary | ICD-10-CM | POA: Insufficient documentation

## 2013-03-18 DIAGNOSIS — R109 Unspecified abdominal pain: Secondary | ICD-10-CM

## 2013-03-18 DIAGNOSIS — E119 Type 2 diabetes mellitus without complications: Secondary | ICD-10-CM | POA: Insufficient documentation

## 2013-03-18 LAB — CBC WITH DIFFERENTIAL/PLATELET
Basophils Relative: 1 % (ref 0–1)
Eosinophils Absolute: 0.2 10*3/uL (ref 0.0–0.7)
HCT: 42.9 % (ref 39.0–52.0)
Hemoglobin: 14.4 g/dL (ref 13.0–17.0)
MCH: 28.1 pg (ref 26.0–34.0)
MCHC: 33.6 g/dL (ref 30.0–36.0)
MCV: 83.6 fL (ref 78.0–100.0)
Monocytes Absolute: 0.4 10*3/uL (ref 0.1–1.0)
Monocytes Relative: 8 % (ref 3–12)

## 2013-03-18 LAB — COMPREHENSIVE METABOLIC PANEL
Albumin: 4.3 g/dL (ref 3.5–5.2)
BUN: 10 mg/dL (ref 6–23)
Chloride: 101 mEq/L (ref 96–112)
Creatinine, Ser: 0.72 mg/dL (ref 0.50–1.35)
Total Bilirubin: 0.2 mg/dL — ABNORMAL LOW (ref 0.3–1.2)
Total Protein: 7.1 g/dL (ref 6.0–8.3)

## 2013-03-18 LAB — URINALYSIS, ROUTINE W REFLEX MICROSCOPIC
Bilirubin Urine: NEGATIVE
Glucose, UA: >1000 mg/dL — AB
Leukocytes, UA: NEGATIVE
Protein, ur: NEGATIVE mg/dL

## 2013-03-18 LAB — POCT URINALYSIS DIPSTICK
Ketones, UA: NEGATIVE
Protein, UA: NEGATIVE
Spec Grav, UA: 1.015
pH, UA: 6.5

## 2013-03-18 LAB — URINE MICROSCOPIC-ADD ON

## 2013-03-18 LAB — LIPASE, BLOOD: Lipase: 46 U/L (ref 11–59)

## 2013-03-18 IMAGING — CT CT ABD-PELV W/ CM
1 of 3 series · 14 of 32 positions shown, 19 images · IV contrast (OMNIPAQUE 300)
Comparison: Earlier non contrast CT of [DATE]

CLINICAL DATA: Left upper quadrant and left flank pain radiating
around left side of abdomen since [REDACTED], remote history kidney
stones

EXAM:
CT ABDOMEN AND PELVIS WITH CONTRAST
TECHNIQUE: Multidetector CT imaging of the abdomen and pelvis was performed
using the standard protocol following bolus administration of
intravenous contrast. Sagittal and coronal MPR images reconstructed
from axial data set.
CONTRAST:  50mL OMNIPAQUE IOHEXOL 300 MG/ML SOLN, 100mL OMNIPAQUE
IOHEXOL 300 MG/ML SOLN

[Series 2: abd/pel with · axial · 0.95mm/px · z∈[+937,+1387]mm · 14 of 102 slices shown, 19 images]
[im 6/102  soft-tissue]
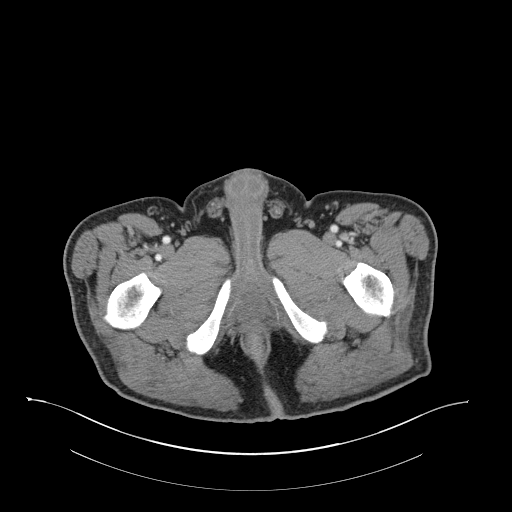
[im 6/102  bone]
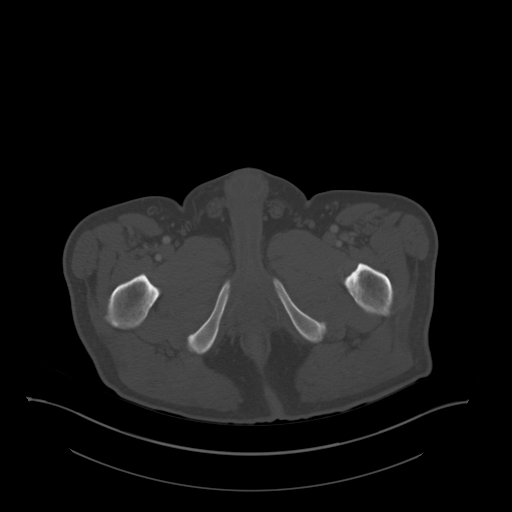
[im 12/102  soft-tissue]
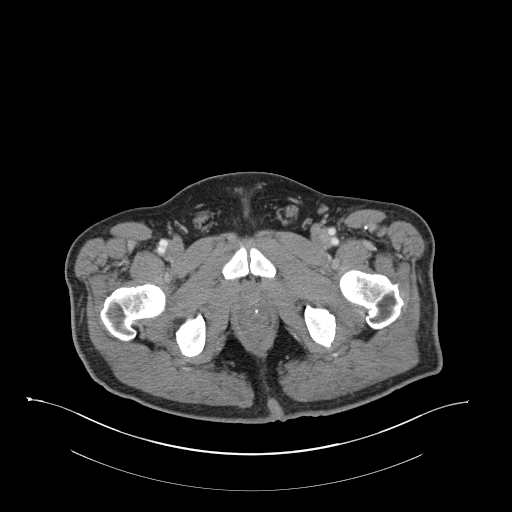
[im 23/102  soft-tissue]
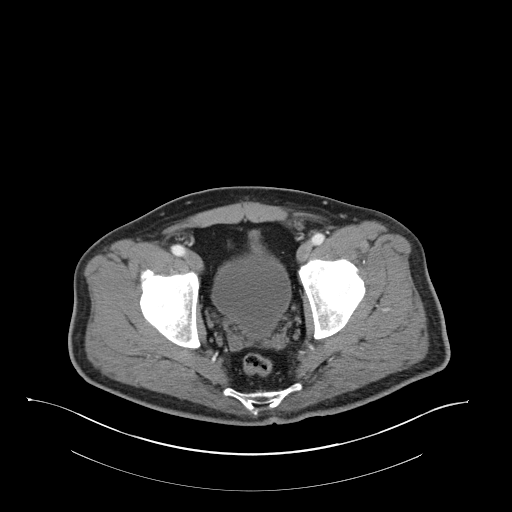
[im 29/102  soft-tissue]
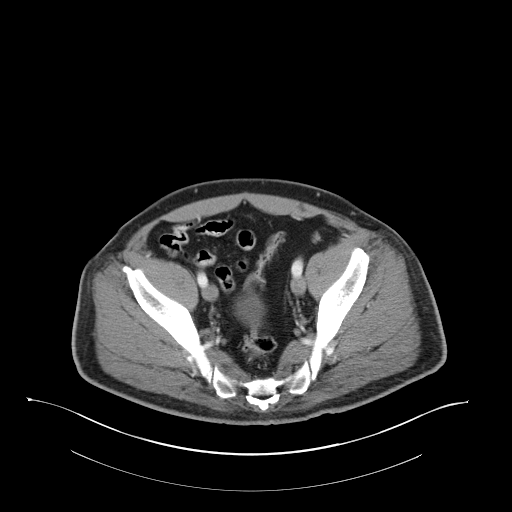
[im 34/102  soft-tissue]
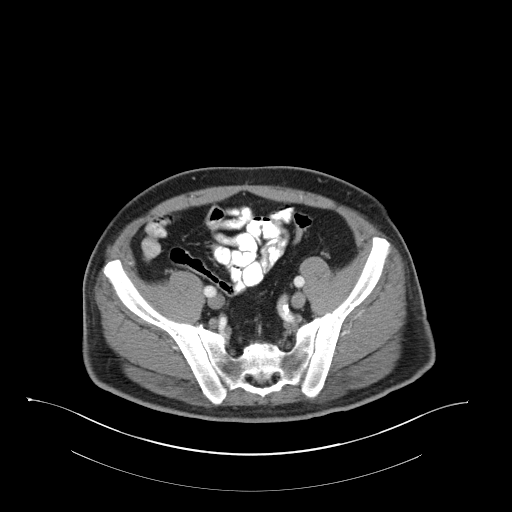
[im 45/102  soft-tissue]
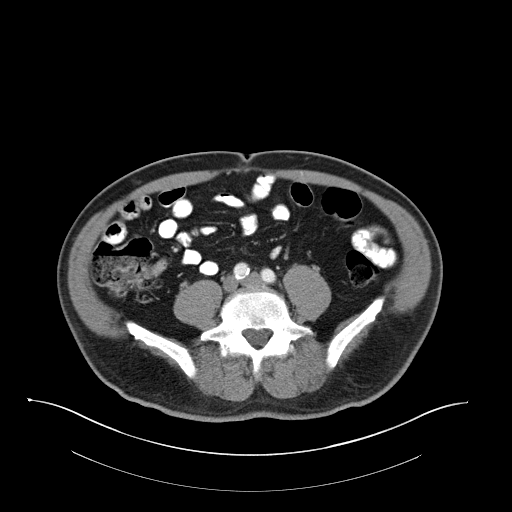
[im 51/102  soft-tissue]
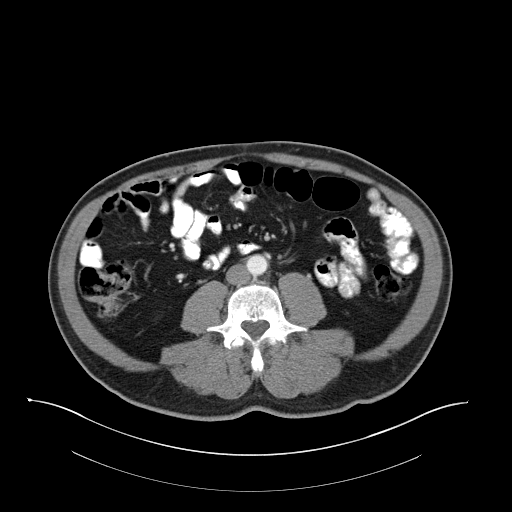
[im 57/102  soft-tissue]
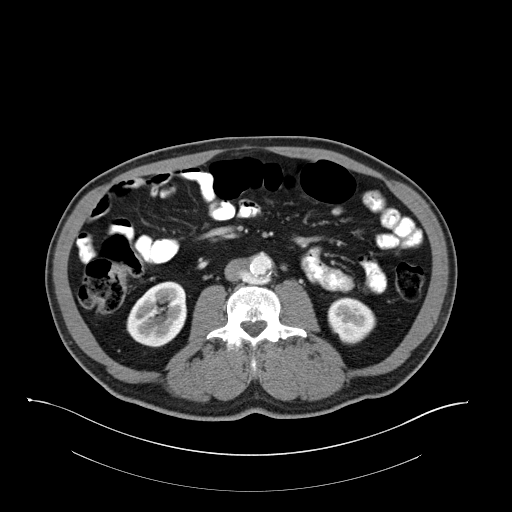
[im 68/102  soft-tissue]
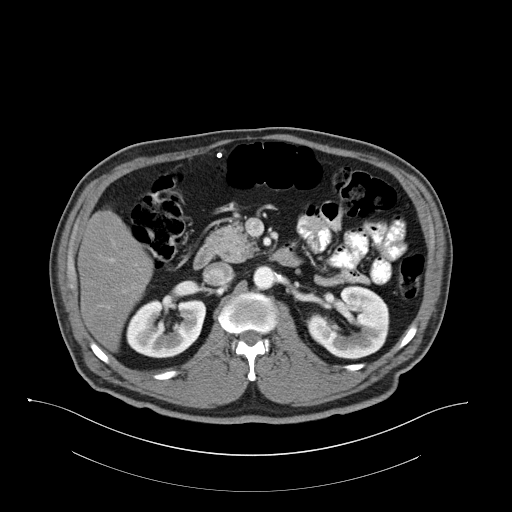
[im 68/102  bone]
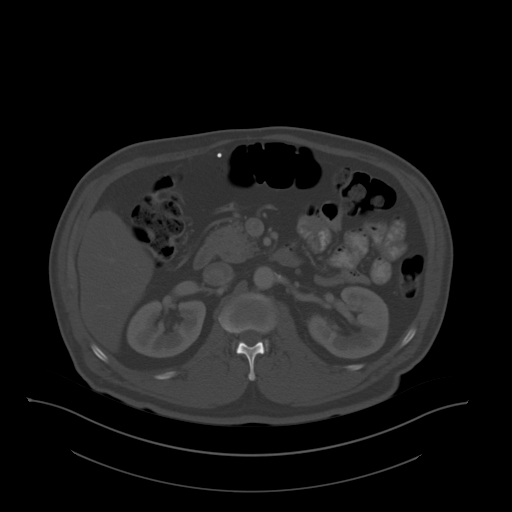
[im 73/102  soft-tissue]
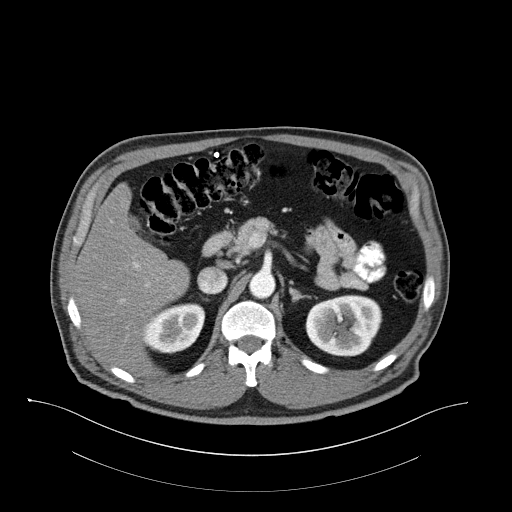
[im 79/102  soft-tissue]
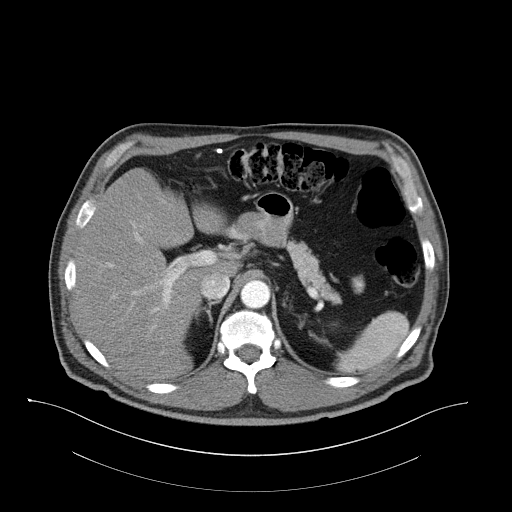
[im 79/102  lung]
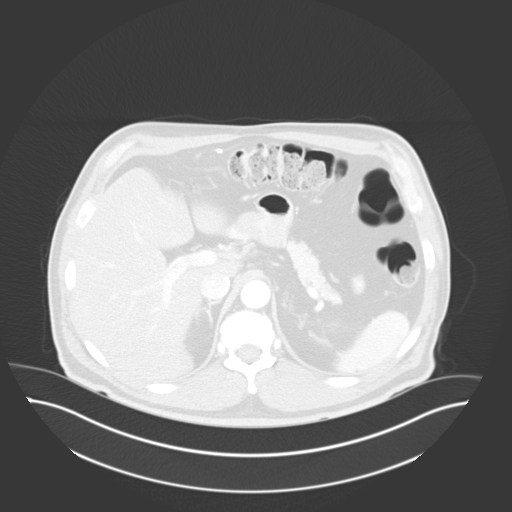
[im 85/102  lung]
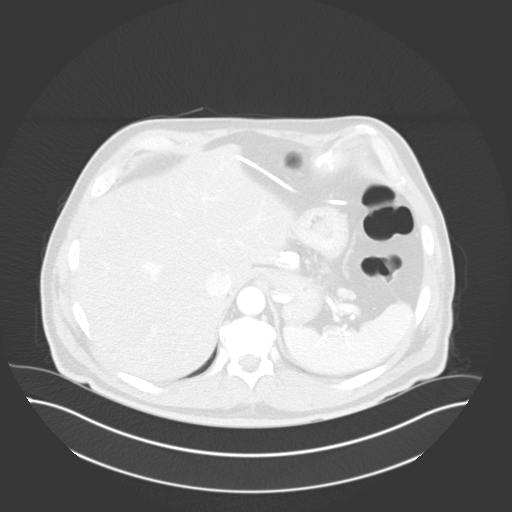
[im 90/102  soft-tissue]
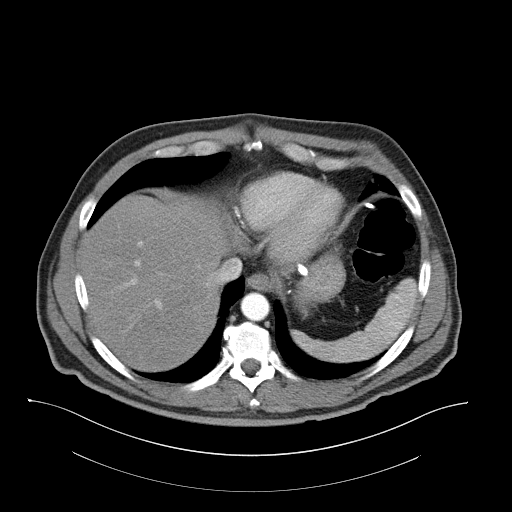
[im 90/102  lung]
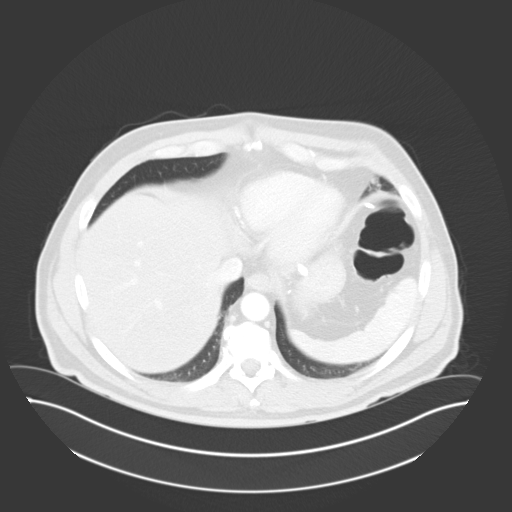
[im 96/102  soft-tissue]
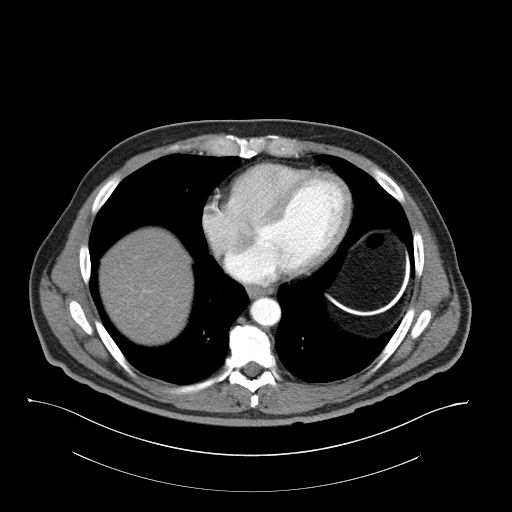
[im 96/102  lung]
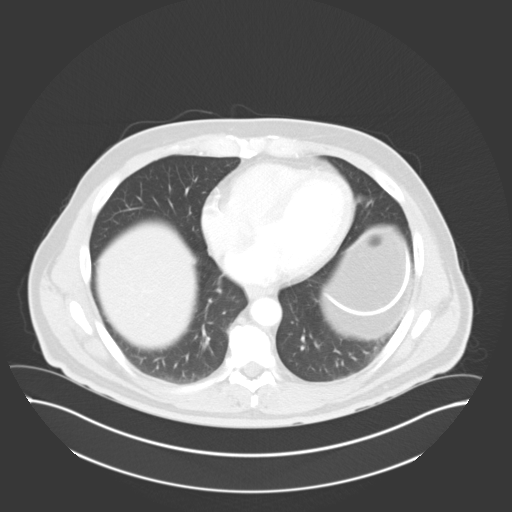

[14 of 32 positions shown; findings below may reference images not displayed]

FINDINGS: Lung bases clear.

Mild fatty infiltration of liver.

Small left renal cyst 14 x 9 mm diameter image 36.

Liver, spleen, pancreas, kidneys, and adrenal glands otherwise
normal.

Retroaortic left renal vein.

Post laparoscopic gastric banding, appearance similar to previous
study.

Mild scattered atherosclerotic calcification.

Large and small bowel loops normal appearance.

Appendix surgically absent by history.

No mass, adenopathy, free fluid or inflammatory process.

Left inguinal hernia containing fat.

No acute osseous findings.
IMPRESSION: No acute intra-abdominal or intrapelvic abnormalities.

## 2013-03-18 IMAGING — CT CT ABD-PELV W/O CM
1 series · 15 of 24 positions shown, 20 images · non-contrast
Comparison: None.

CLINICAL DATA: Abdomen pain.  Left flank pain.

EXAM:
CT ABDOMEN AND PELVIS WITHOUT CONTRAST
TECHNIQUE: Multidetector CT imaging of the abdomen and pelvis was performed
following the standard protocol without intravenous contrast.

[Series 6: lung · axial · 0.83mm/px · z∈[+3,+108]mm · 15 of 24 slices shown, 20 images]
[im 2/24  soft-tissue]
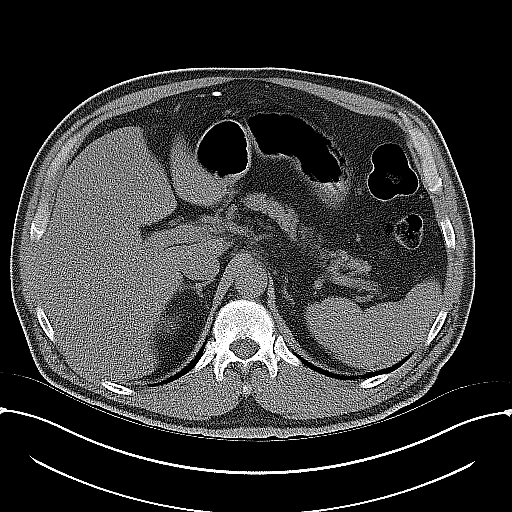
[im 2/24  bone]
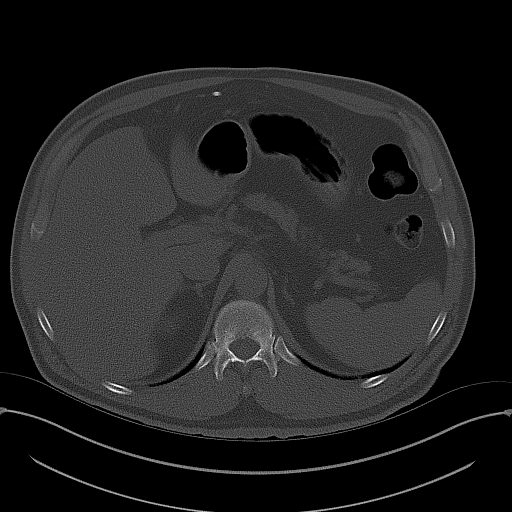
[im 4/24  soft-tissue]
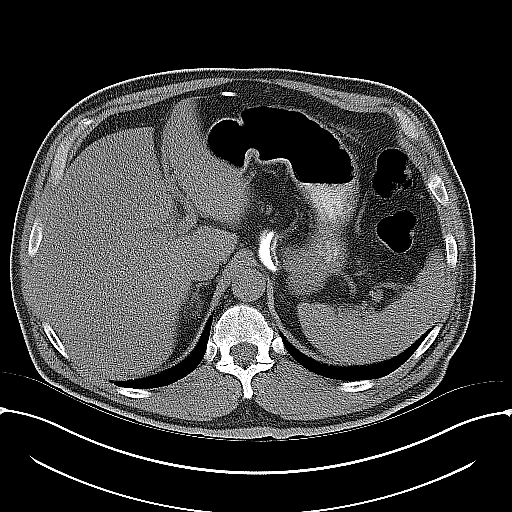
[im 5/24  soft-tissue]
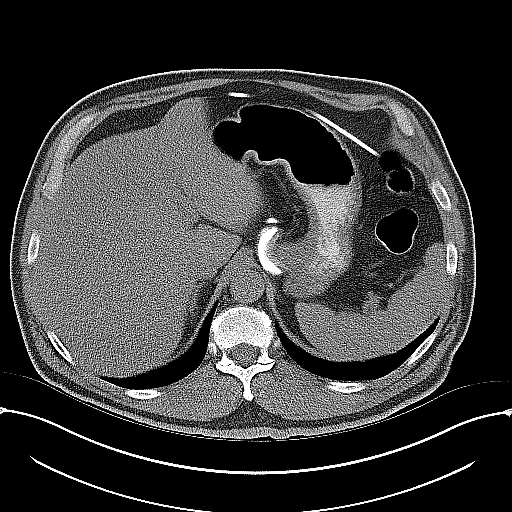
[im 7/24  soft-tissue]
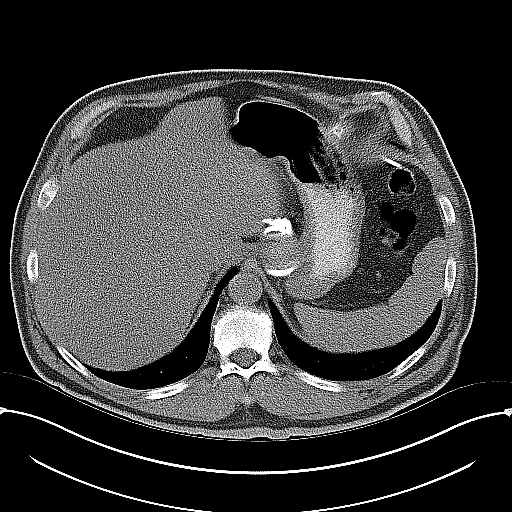
[im 9/24  soft-tissue]
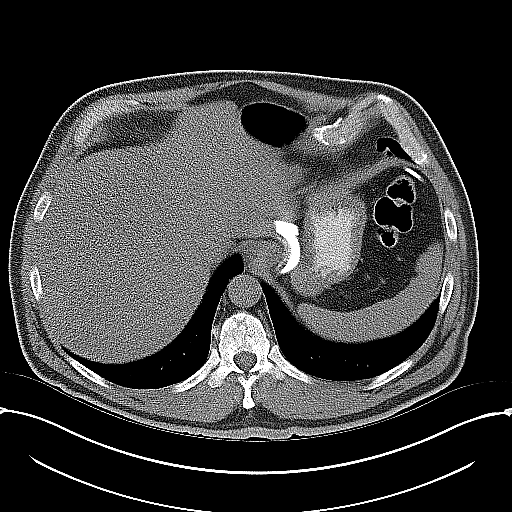
[im 10/24  soft-tissue]
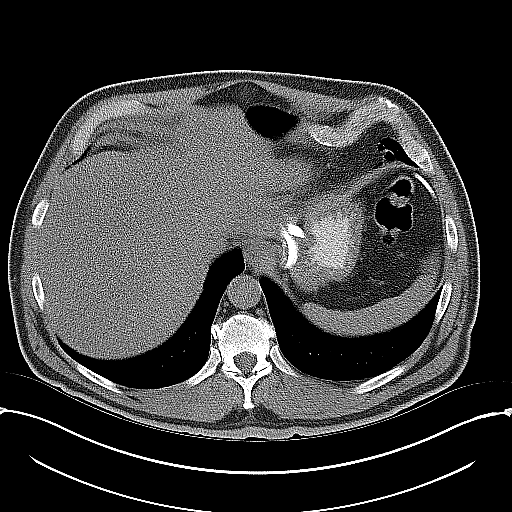
[im 12/24  soft-tissue]
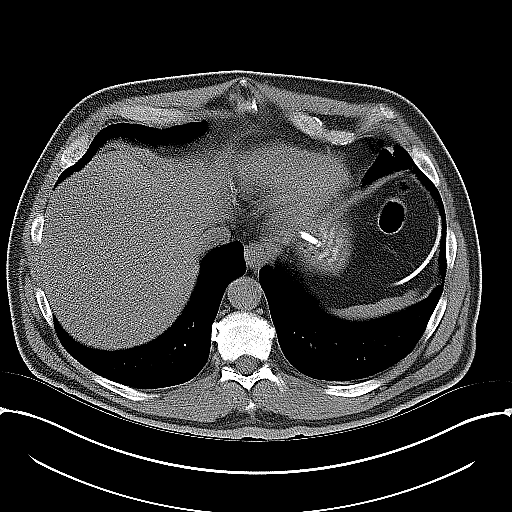
[im 13/24  soft-tissue]
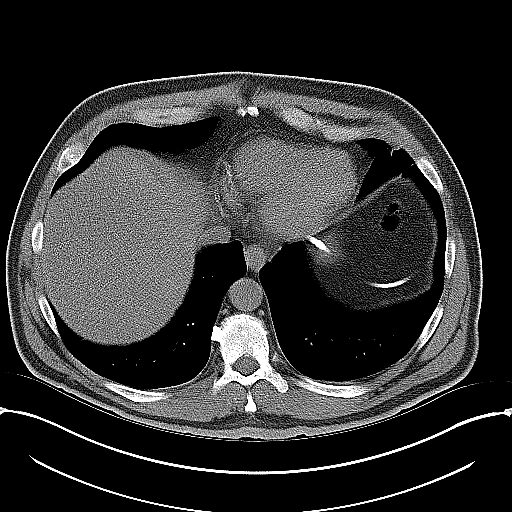
[im 15/24  soft-tissue]
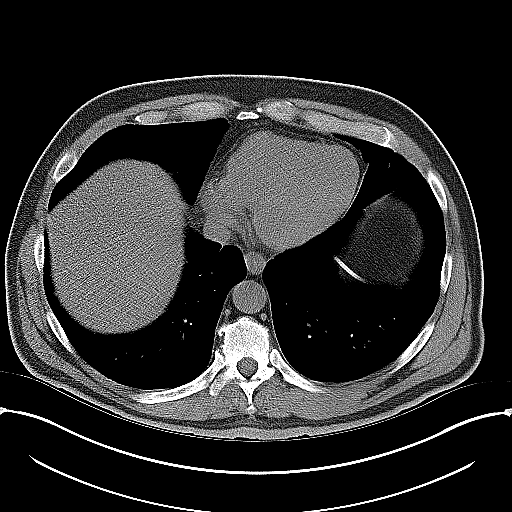
[im 15/24  bone]
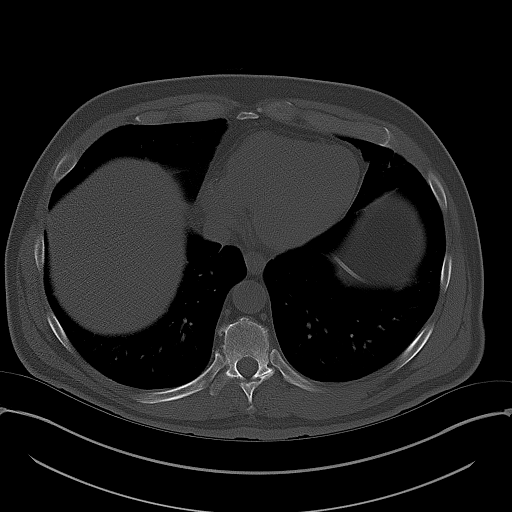
[im 16/24  soft-tissue]
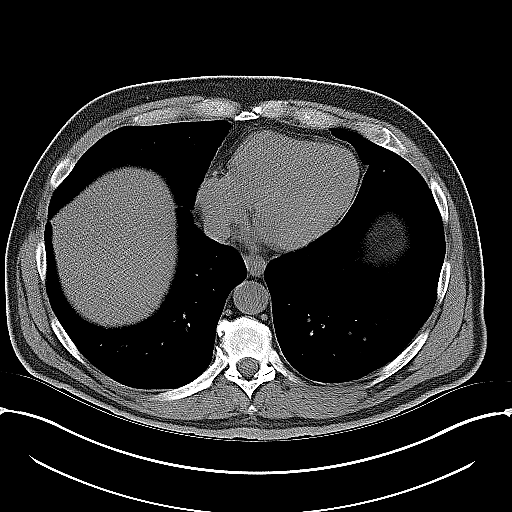
[im 18/24  soft-tissue]
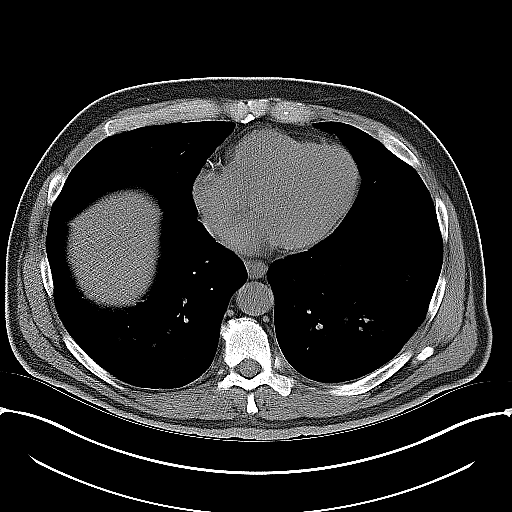
[im 20/24  soft-tissue]
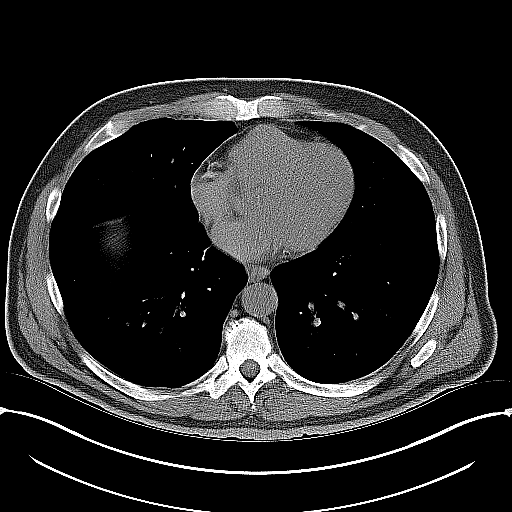
[im 20/24  lung]
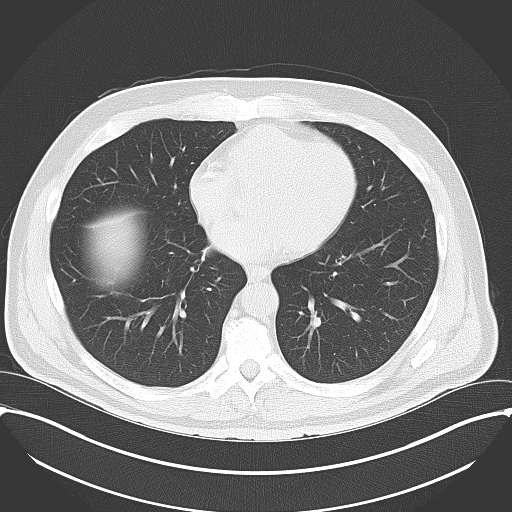
[im 21/24  soft-tissue]
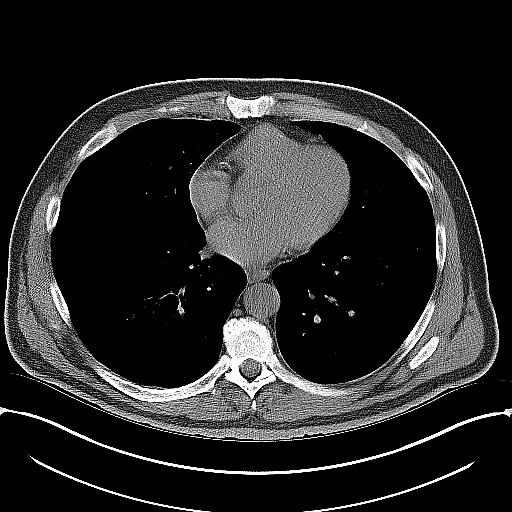
[im 21/24  lung]
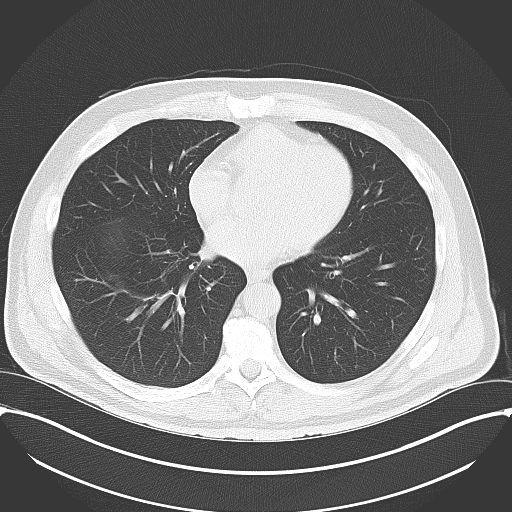
[im 22/24  lung]
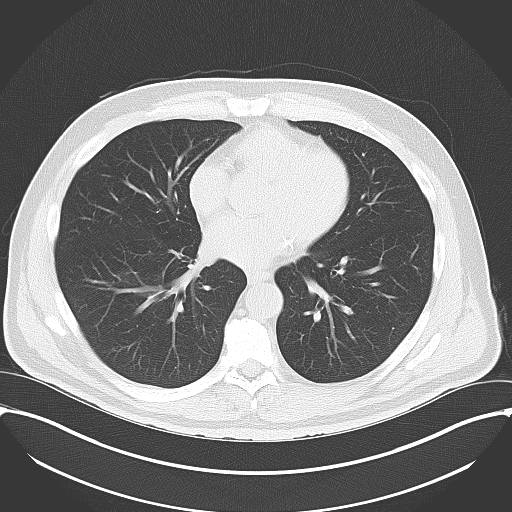
[im 23/24  soft-tissue]
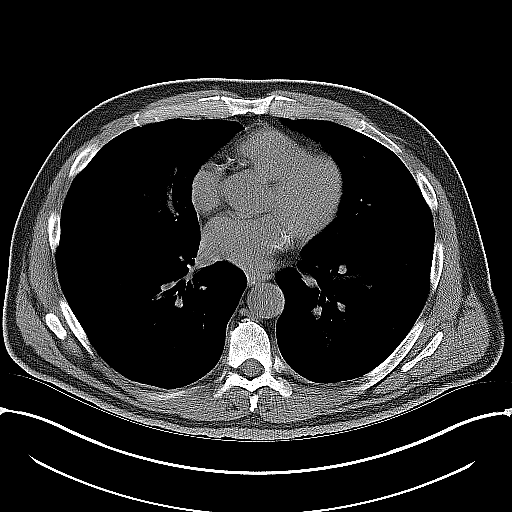
[im 23/24  lung]
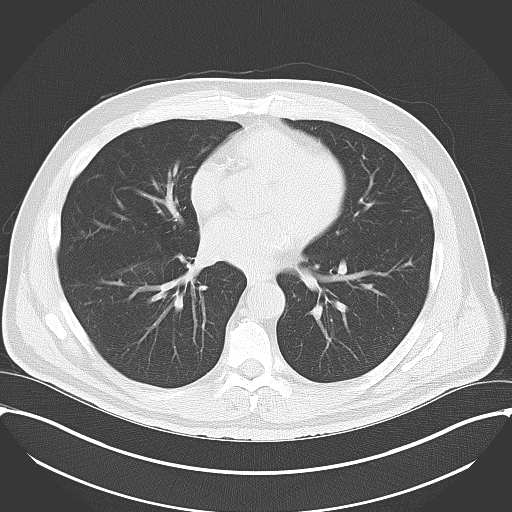

[15 of 24 positions shown; findings below may reference images not displayed]

FINDINGS: The liver, spleen, pancreas, gallbladder, adrenal glands and kidneys
are normal. There is no nephrolithiasis or hydroureteronephrosis
bilaterally. Gastric banding is identified. There is no small bowel
obstruction or diverticulitis. Patient is status post prior
appendectomy. There is atherosclerosis of the abdominal aorta
without aneurysmal dilatation. There is no abdominal
lymphadenopathy.

Fluid-filled bladder is normal. There is left inguinal herniation of
mesenteric fat. The visualized lung bases demonstrate no focal
pneumonia or pleural effusion. Degenerative joint changes of the
spine are noted.
IMPRESSION: No acute abnormality identified in the abdomen and pelvis. There is
no nephrolithiasis or hydroureteronephrosis bilaterally.

## 2013-03-18 MED ORDER — IOHEXOL 300 MG/ML  SOLN
50.0000 mL | Freq: Once | INTRAMUSCULAR | Status: AC | PRN
  Administered 2013-03-18: 50 mL via ORAL

## 2013-03-18 MED ORDER — ACYCLOVIR 800 MG PO TABS
800.0000 mg | ORAL_TABLET | Freq: Once | ORAL | Status: AC
  Administered 2013-03-18: 800 mg via ORAL
  Filled 2013-03-18: qty 1

## 2013-03-18 MED ORDER — HYDROCODONE-ACETAMINOPHEN 5-325 MG PO TABS: 1.0000 | ORAL_TABLET | Freq: Four times a day (QID) | ORAL | Status: DC | PRN

## 2013-03-18 MED ORDER — IOHEXOL 300 MG/ML  SOLN
100.0000 mL | Freq: Once | INTRAMUSCULAR | Status: AC | PRN
  Administered 2013-03-18: 100 mL via INTRAVENOUS

## 2013-03-18 MED ORDER — ACYCLOVIR 800 MG PO TABS: 800.0000 mg | ORAL_TABLET | Freq: Every day | ORAL | Status: DC

## 2013-03-18 NOTE — Progress Notes (Signed)
  Subjective:    Patient ID: Raymond Vasquez, male    DOB: 05-05-1951, 62 y.o.   MRN: [id number]  HPI [name] is a 62 year old male patient Dr. Cleotis Nipper who comes in today for evaluation of abdominal pain  He states he was well until last Wednesday, October 22 when he noticed a sudden onset of severe left flank pain. He says the pain was initially an 8-10 on a scale of 1-10. It started suddenly and he points to the left flank as a source for his discomfort. Over the next couple days it began radiating to the abdomen and now he has no flank pain he just has left upper quadrant pain. He states the pain is constant dull it very 3-4 and a 9. Had a kidney stone years ago but was different. He has no fever chills nausea vomiting diarrhea or urinary tract symptoms. His blood sugar today was 237 urinalysis showed no blood. He is a diabetic he states his morning sugars run in the 140-150 range.  He's had lap band surgery   Review of Systems    review of systems otherwise negative Objective:   Physical Exam Well-developed well-nourished male in acute pain  In the supine position the abdomen appears flat bowel sounds are normal tenderness left upper quadrant no rebound. There is a palpable mass in the right upper quadrant which he says is the port for the lap band surgery. 6       Assessment & Plan:  Six-day history of severe left upper quadrant abdominal pain plan to the ER for diagnostic evaluation

## 2013-03-18 NOTE — ED Notes (Signed)
Pt c/o flank pain that radiates around to l side of abdomen since weds.  Pt denies urinary problems. Reports hx of kidney stone many years ago. Denies n/v/d.

## 2013-03-18 NOTE — ED Provider Notes (Signed)
CSN: [id number]     Arrival date & time 03/18/13  1633 History   First MD Initiated Contact with Patient 03/18/13 1653     Chief Complaint  Patient presents with  . Abdominal Pain   (Consider location/radiation/quality/duration/timing/severity/associated sxs/prior Treatment) Patient is a 62 y.o. male presenting with abdominal pain.  Abdominal Pain Pain location:  L flank Pain quality: cramping   Pain radiates to:  Epigastric region and back Pain severity:  Moderate Onset quality:  Sudden Duration:  6 days Timing:  Constant Progression:  Worsening Chronicity:  New Context: not diet changes, not eating, not recent illness, not sick contacts and not trauma   Relieved by:  Nothing Worsened by:  Nothing tried Associated symptoms: no chest pain, no constipation, no cough, no diarrhea, no fever, no nausea, no shortness of breath and no vomiting     Past Medical History  Diagnosis Date  . Diabetes mellitus type II, controlled, with no complications   . OSA (obstructive sleep apnea)   . Hyperlipidemia   . Hypertension    Past Surgical History  Procedure Laterality Date  . Appendectomy    . Laparoscopic gastric banding  08/10/2009   History reviewed. No pertinent family history. History  Substance Use Topics  . Smoking status: Current Some Day Smoker    Types: Cigars  . Smokeless tobacco: Current User  . Alcohol Use: Not on file    Review of Systems  Constitutional: Negative for fever.  Respiratory: Negative for cough and shortness of breath.   Cardiovascular: Negative for chest pain and leg swelling.  Gastrointestinal: Positive for abdominal pain. Negative for nausea, vomiting, diarrhea and constipation.  All other systems reviewed and are negative.    Allergies  Review of patient's allergies indicates no known allergies.  Home Medications   Current Outpatient Rx  Name  Route  Sig  Dispense  Refill  . acetaminophen (TYLENOL) 325 MG tablet   Oral   Take 650 mg  by mouth every 6 (six) hours as needed for pain.         . metFORMIN (GLUCOPHAGE) 500 MG tablet   Oral   Take 500 mg by mouth 2 (two) times daily with a meal.         . Multiple Vitamin (MULTIVITAMIN WITH MINERALS) TABS tablet   Oral   Take 1 tablet by mouth daily.         . simvastatin (ZOCOR) 20 MG tablet   Oral   Take 20 mg by mouth every morning.         Marland Kitchen acyclovir (ZOVIRAX) 800 MG tablet   Oral   Take 1 tablet (800 mg total) by mouth 5 (five) times daily.   50 tablet   0   . HYDROcodone-acetaminophen (NORCO/VICODIN) 5-325 MG per tablet   Oral   Take 1 tablet by mouth every 6 (six) hours as needed for pain.   30 tablet   0    BP 143/81  Pulse 64  Temp(Src) 98.3 F (36.8 C) (Oral)  Resp 16  SpO2 99% Physical Exam  Nursing note and vitals reviewed. Constitutional: He is oriented to person, place, and time. He appears well-developed and well-nourished. No distress.  HENT:  Head: Normocephalic and atraumatic.  Mouth/Throat: No oropharyngeal exudate.  Eyes: EOM are normal. Pupils are equal, round, and reactive to light.  Neck: Normal range of motion. Neck supple.  Cardiovascular: Normal rate and regular rhythm.  Exam reveals no friction rub.   No murmur heard.  Pulmonary/Chest: Effort normal and breath sounds normal. No respiratory distress. He has no wheezes. He has no rales.  Abdominal: He exhibits mass (R sided, port from prior lap band surgery). He exhibits no distension. There is tenderness (L flank). There is no rebound.  Musculoskeletal: Normal range of motion. He exhibits no edema.  Neurological: He is alert and oriented to person, place, and time.  Skin: He is not diaphoretic.    ED Course  Procedures (including critical care time) Labs Review Labs Reviewed  COMPREHENSIVE METABOLIC PANEL - Abnormal; Notable for the following:    Glucose, Bld 187 (*)    Total Bilirubin 0.2 (*)    All other components within normal limits  URINALYSIS, ROUTINE W  REFLEX MICROSCOPIC - Abnormal; Notable for the following:    Glucose, UA >1000 (*)    All other components within normal limits  CBC WITH DIFFERENTIAL  LIPASE, BLOOD  URINE MICROSCOPIC-ADD ON   Imaging Review Ct Abdomen Pelvis Wo Contrast  03/18/2013   CLINICAL DATA:  Abdomen pain.  Left flank pain.  EXAM: CT ABDOMEN AND PELVIS WITHOUT CONTRAST  TECHNIQUE: Multidetector CT imaging of the abdomen and pelvis was performed following the standard protocol without intravenous contrast.  COMPARISON:  None.  FINDINGS: The liver, spleen, pancreas, gallbladder, adrenal glands and kidneys are normal. There is no nephrolithiasis or hydroureteronephrosis bilaterally. Gastric banding is identified. There is no small bowel obstruction or diverticulitis. Patient is status post prior appendectomy. There is atherosclerosis of the abdominal aorta without aneurysmal dilatation. There is no abdominal lymphadenopathy.  Fluid-filled bladder is normal. There is left inguinal herniation of mesenteric fat. The visualized lung bases demonstrate no focal pneumonia or pleural effusion. Degenerative joint changes of the spine are noted.  IMPRESSION: No acute abnormality identified in the abdomen and pelvis. There is no nephrolithiasis or hydroureteronephrosis bilaterally.   Electronically Signed   By: Sherian Rein M.D.   On: 03/18/2013 17:48   Ct Abdomen Pelvis W Contrast  03/18/2013   CLINICAL DATA:  Left upper quadrant and left flank pain radiating around left side of abdomen since Wednesday, remote history kidney stones  EXAM: CT ABDOMEN AND PELVIS WITH CONTRAST  TECHNIQUE: Multidetector CT imaging of the abdomen and pelvis was performed using the standard protocol following bolus administration of intravenous contrast. Sagittal and coronal MPR images reconstructed from axial data set.  CONTRAST:  50mL OMNIPAQUE IOHEXOL 300 MG/ML SOLN, OMNIPAQUE IOHEXOL 300 MG/ML SOLN  COMPARISON:  Earlier non contrast CT of 03/18/2013   FINDINGS: Lung bases clear.  Mild fatty infiltration of liver.  Small left renal cyst 14 x 9 mm diameter image 36.  Liver, spleen, pancreas, kidneys, and adrenal glands otherwise normal.  Retroaortic left renal vein.  Post laparoscopic gastric banding, appearance similar to previous study.  Mild scattered atherosclerotic calcification.  Large and small bowel loops normal appearance.  Appendix surgically absent by history.  No mass, adenopathy, free fluid or inflammatory process.  Left inguinal hernia containing fat.  No acute osseous findings.  IMPRESSION: No acute intra-abdominal or intrapelvic abnormalities.   Electronically Signed   By: Ulyses Southward M.D.   On: 03/18/2013 20:13    EKG Interpretation   None       MDM   1. Shingles   2. Abdominal pain    19M with L sided flank pain. Persistent for past 6 days, crampy, waxes and wanes. No fevers, vomiting, nausea, diarrhea, CP, SOB. AFVSS here. Sent by his PCP for a scan. Denies  GU symptoms, has hx of stones, had UA with negative blood per him today. Patient here with L flank tenderness. Small vesicular eruption on anterior abdomen L of midline. No masses in abdomen other than port for lap band. Patient doesn't want pain meds at this time.  Will start with noncontrasted CT as his pain states similar to prior stones, however if negative will pursue contrasted scan as his constant pain for 6 days doesn't fit with stone disease. CT w/o contrast normal. CT w/ contrast normal. With vesicular eruption and normal scans, will treat as shingles. Given pain meds and acyclovir. PCP f/u instructed.      Dagmar Hait, MD 03/18/13 2330

## 2013-03-18 NOTE — Patient Instructions (Signed)
Go directly to the emergency room now for further evaluation

## 2013-03-20 ENCOUNTER — Telehealth: Payer: Self-pay | Admitting: Internal Medicine

## 2013-03-20 MED ORDER — GABAPENTIN 300 MG PO CAPS: 300.0000 mg | ORAL_CAPSULE | Freq: Every day | ORAL | Status: DC

## 2013-03-20 NOTE — Telephone Encounter (Signed)
Pt seen by Dr Tawanna Cooler on 10/27. Went to [facility] and dx w/ shingles. Pt states the pain is so bad and the hydrocodone 5/325 is just not getting it. Pt would ike something stronger or should he double up on the med? Also wife is 37 and has not had the vaccine for shingles. pls advise.

## 2013-03-20 NOTE — Telephone Encounter (Signed)
neurontin 300 mg po qhs. #300/1 refill  F/u with padonda if necessary

## 2013-03-20 NOTE — Telephone Encounter (Signed)
rx called in, pt aware 

## 2013-03-21 ENCOUNTER — Encounter: Payer: Self-pay | Admitting: Family Medicine

## 2013-03-21 ENCOUNTER — Ambulatory Visit (INDEPENDENT_AMBULATORY_CARE_PROVIDER_SITE_OTHER): Payer: 59 | Admitting: Family Medicine

## 2013-03-21 VITALS — BP 140/90 | Temp 98.2°F | Wt 215.0 lb

## 2013-03-21 DIAGNOSIS — B029 Zoster without complications: Secondary | ICD-10-CM

## 2013-03-21 MED ORDER — OXYCODONE-ACETAMINOPHEN 5-325 MG PO TABS: 1.0000 | ORAL_TABLET | Freq: Three times a day (TID) | ORAL | Status: DC | PRN

## 2013-03-21 NOTE — Patient Instructions (Signed)
Stop the Vicodin  Percocet 5 mg......... one half to one tablet 3 times daily. For pain  Ice packs  Return to see Dr. Cleotis Nipper on Monday for followup  Start the gabapentin tonight

## 2013-03-21 NOTE — Progress Notes (Signed)
  Subjective:    Patient ID: Raymond Vasquez, male    DOB: 12-26-1950, 62 y.o.   MRN: [id number]  HPI Raymond Vasquez is a 62 year old male who comes in today accompanied by his wife for evaluation of shingles  I saw him on the 27th of this month with severe pain. We sent him to the emergency room for evaluation. Diagnostic studies were all normal and then he broke out in a rash. He went back emerge from the gave him acyclovir 800 mg 5 times daily and Vicodin 5 mg every 4 hours. Dr. Cleotis Nipper call him and some gabapentin pain yesterday which he has not gotten filled yet. He says his pain is 10 on a scale of 1-10 and the Vicodin is not helping   Review of Systems    negative Objective:   Physical Exam  Well-developed well-nourished male in acute pain examinations skin shows the shingles rash from the mid lower spinal area to the anterior abdominal      Assessment & Plan:  Acute zoster,,,,,,,,,,,, see orders

## 2013-03-25 ENCOUNTER — Encounter: Payer: Self-pay | Admitting: Family

## 2013-03-25 ENCOUNTER — Ambulatory Visit (INDEPENDENT_AMBULATORY_CARE_PROVIDER_SITE_OTHER): Payer: 59 | Admitting: Family

## 2013-03-25 VITALS — BP 140/92 | HR 60 | Wt 212.0 lb

## 2013-03-25 DIAGNOSIS — B029 Zoster without complications: Secondary | ICD-10-CM

## 2013-03-25 DIAGNOSIS — B0229 Other postherpetic nervous system involvement: Secondary | ICD-10-CM

## 2013-03-25 MED ORDER — GABAPENTIN 300 MG PO CAPS: 300.0000 mg | ORAL_CAPSULE | Freq: Three times a day (TID) | ORAL | Status: DC

## 2013-03-25 NOTE — Progress Notes (Signed)
Subjective:    Patient ID: Raymond Vasquez, male    DOB: 1950-12-24, 62 y.o.   MRN: [id number]  HPI 62 year old white male, patient of sources in today with persistent neuralgia after an outbreak of shingles. He was seen on 03/18/2013 by Dr. Tawanna Cooler to the emergency department. The emergency department determined that he had shingles after he later developed a rash. He was seen again on 03/21/2013 and was given more pain medication. He reports is better today but continues to be sensitive to touch. Has been taking the pain medicine approximately every 6 hours. Takes Neurontin at bedtime. Pain now is approximately 4/10.   Review of Systems  Constitutional: Negative.   Respiratory: Negative.   Cardiovascular: Negative.   Gastrointestinal: Positive for abdominal pain. Negative for constipation, blood in stool and abdominal distention.  Musculoskeletal: Positive for myalgias.       Left abdomen  Skin: Negative.   Neurological:       Pain that radiates from the left back to left abdomen  Hematological: Negative.   Psychiatric/Behavioral: Negative.    Past Medical History  Diagnosis Date  . Diabetes mellitus type II, controlled, with no complications   . OSA (obstructive sleep apnea)   . Hyperlipidemia   . Hypertension     History   Social History  . Marital Status: Married    Spouse Name: N/A    Number of Children: N/A  . Years of Education: N/A   Occupational History  . Not on file.   Social History Main Topics  . Smoking status: Current Some Day Smoker    Types: Cigars  . Smokeless tobacco: Current User  . Alcohol Use: Not on file  . Drug Use: Not on file  . Sexual Activity: Not on file   Other Topics Concern  . Not on file   Social History Narrative  . No narrative on file    Past Surgical History  Procedure Laterality Date  . Appendectomy    . Laparoscopic gastric banding  08/10/2009    No family history on file.  No Known Allergies  Current Outpatient  Prescriptions on File Prior to Visit  Medication Sig Dispense Refill  . oxyCODONE-acetaminophen (ROXICET) 5-325 MG per tablet Take 1 tablet by mouth every 8 (eight) hours as needed for pain.  30 tablet  0  . acetaminophen (TYLENOL) 325 MG tablet Take 650 mg by mouth every 6 (six) hours as needed for pain.      Marland Kitchen acyclovir (ZOVIRAX) 800 MG tablet Take 1 tablet (800 mg total) by mouth 5 (five) times daily.  50 tablet  0  . HYDROcodone-acetaminophen (NORCO/VICODIN) 5-325 MG per tablet Take 1 tablet by mouth every 6 (six) hours as needed for pain.  30 tablet  0  . metFORMIN (GLUCOPHAGE) 500 MG tablet Take 500 mg by mouth 2 (two) times daily with a meal.      . Multiple Vitamin (MULTIVITAMIN WITH MINERALS) TABS tablet Take 1 tablet by mouth daily.      . simvastatin (ZOCOR) 20 MG tablet Take 20 mg by mouth every morning.       No current facility-administered medications on file prior to visit.    BP 140/92  Pulse 60  Wt 212 lb (96.163 kg)chart    Objective:   Physical Exam  Constitutional: He is oriented to person, place, and time. He appears well-nourished.  Neck: Normal range of motion. Neck supple.  Cardiovascular: Normal rate, regular rhythm and normal heart sounds.  Pulmonary/Chest: Effort normal and breath sounds normal.  Abdominal: Soft. He exhibits no distension. There is tenderness. There is no rebound.  Neurological: He is alert and oriented to person, place, and time.  Skin: Skin is warm. Rash noted.  Herpes zoster rash noted to the left abdomen laterally into the left thoracic area of the back. Lesions are dry and crusted over. Sensitive to touch.  Psychiatric: He has a normal mood and affect.          Assessment & Plan:  Assessment: 1. Shingles 2. Postherpetic neuralgia  Plan: Continue Percocet as needed for pain sparingly. However, increase Neurontin to 300 mg 3 times a day to help with neuropathic pain. Continue acyclovir. Probably office with any questions or  concerns. Recheck as scheduled, and as needed.

## 2013-03-25 NOTE — Patient Instructions (Signed)
Postherpetic Neuralgia Shingles is a painful disease. It is caused by the herpes zoster virus. This is the same virus which also causes chickenpox. It can affect the torso, limbs, or the face. For most people, shingles is a condition of rather sudden onset. Pain usually lasts about 1 month. In older patients, or patients with poor immune systems, a painful, long-standing (chronic) condition called postherpetic neuralgia can develop. This condition rarely happens before age 50. But at least 50% of people over 50 become affected following an attack of shingles. There is a natural tendency for this condition to improve over time with no treatment. Less than 5% of patients have pain that lasts for more than 1 year. DIAGNOSIS  Herpes is usually easily diagnosed on physical exam. Pain sometimes follows when the skin sores (lesions) have disappeared. It is called postherpetic neuralgia. That name simply means the pain that follows herpes. TREATMENT   Treating this condition may be difficult. Usually one of the tricyclic antidepressants, often amitriptyline, is the first line of treatment. There is evidence that the sooner these medications are given, the more likely they are to reduce pain.  Conventional analgesics, regional nerve blocks, and anticonvulsants have little benefit in most cases when used alone. Other tricyclic anti-depressants are used as a second option if the first antidepressant is unsuccessful.  Anticonvulsants, including carbamazepine, have been found to provide some added benefit when used with a tricyclic anti-depressant. This is especially for the stabbing type of pain similar to that of trigeminal neuralgia.  Chronic opioid therapy. This is a strong narcotic pain medication. It is used to treat pain that is resistant to other measures. The issues of dependency and tolerance can be reduced with closely managed care.  Some cream treatments are applied locally to the affected area. They  can help when used with other treatments. Their use may be difficult in the case of postherpetic trigeminal neuralgia. This is involved with the face. So the substances can irritate the eye and the skin around the eye. Examples of creams used include Capsaicin and lidocaine creams.  For shingles, antiviral therapies along with analgesics are recommended. Studies of the effect of anti-viral agents such as acyclovir on shingles have been done. They show improved rates of healing and decreased severity of sudden (acute) pain. Some observations suggest that nerve blocks during shingles infection will:  Reduce pain.  Shorten the acute episode.  Prevent the emergence of postherpetic neuralgia. Viral medications used include Acyclovir (Zovirax), Valacyclovir, Famciclovir and a lysine diet. Document Released: 07/30/2002 Document Revised: 08/01/2011 Document Reviewed: 05/09/2005 [employer] Patient Information 2014 [employer].  

## 2013-03-26 ENCOUNTER — Ambulatory Visit: Payer: 59 | Admitting: Gastroenterology

## 2013-03-28 ENCOUNTER — Telehealth: Payer: Self-pay | Admitting: Internal Medicine

## 2013-03-28 DIAGNOSIS — B029 Zoster without complications: Secondary | ICD-10-CM

## 2013-03-28 MED ORDER — OXYCODONE-ACETAMINOPHEN 5-325 MG PO TABS: 1.0000 | ORAL_TABLET | Freq: Three times a day (TID) | ORAL | Status: DC | PRN

## 2013-03-28 NOTE — Telephone Encounter (Signed)
Left message to advise pt Rx ready to pick up 

## 2013-03-28 NOTE — Telephone Encounter (Signed)
Pt had Rx filled by Dr. Tawanna Cooler on 03/21/13. Message forwarded to [facility] for review

## 2013-03-28 NOTE — Telephone Encounter (Signed)
Printed. Please advise to pick up.

## 2013-03-28 NOTE — Telephone Encounter (Signed)
Pt needs new rx oxycodone. Pt saw NP on 03-25-13

## 2013-04-04 ENCOUNTER — Encounter: Payer: Self-pay | Admitting: Internal Medicine

## 2013-04-05 ENCOUNTER — Other Ambulatory Visit (INDEPENDENT_AMBULATORY_CARE_PROVIDER_SITE_OTHER): Payer: 59

## 2013-04-05 DIAGNOSIS — Z0000 Encounter for general adult medical examination without abnormal findings: Secondary | ICD-10-CM

## 2013-04-05 LAB — HEPATIC FUNCTION PANEL
ALT: 25 U/L (ref 0–53)
AST: 20 U/L (ref 0–37)
Albumin: 4.2 g/dL (ref 3.5–5.2)
Alkaline Phosphatase: 45 U/L (ref 39–117)
Bilirubin, Direct: 0.1 mg/dL (ref 0.0–0.3)
Total Protein: 6.6 g/dL (ref 6.0–8.3)

## 2013-04-05 LAB — LIPID PANEL
Cholesterol: 176 mg/dL (ref 0–200)
HDL: 42.2 mg/dL (ref >39.00)
LDL Cholesterol: 103 mg/dL — ABNORMAL HIGH (ref 0–99)
Total CHOL/HDL Ratio: 4
Triglycerides: 155 mg/dL — ABNORMAL HIGH (ref 0.0–149.0)
VLDL: 31 mg/dL (ref 0.0–40.0)

## 2013-04-05 LAB — CBC WITH DIFFERENTIAL/PLATELET
Basophils Absolute: 0.1 10*3/uL (ref 0.0–0.1)
Basophils Relative: 1.1 % (ref 0.0–3.0)
Eosinophils Absolute: 0.2 10*3/uL (ref 0.0–0.7)
Eosinophils Relative: 4.1 % (ref 0.0–5.0)
Lymphocytes Relative: 18.6 % (ref 12.0–46.0)
MCHC: 32.6 g/dL (ref 30.0–36.0)
Monocytes Relative: 9.6 % (ref 3.0–12.0)
Neutro Abs: 3.4 10*3/uL (ref 1.4–7.7)
Neutrophils Relative %: 66.6 % (ref 43.0–77.0)
Platelets: 258 10*3/uL (ref 150.0–400.0)
RBC: 5.25 Mil/uL (ref 4.22–5.81)
RDW: 14.4 % (ref 11.5–14.6)

## 2013-04-05 LAB — BASIC METABOLIC PANEL
BUN: 15 mg/dL (ref 6–23)
CO2: 32 mEq/L (ref 19–32)
Chloride: 101 mEq/L (ref 96–112)
Potassium: 4.3 mEq/L (ref 3.5–5.1)
Sodium: 140 mEq/L (ref 135–145)

## 2013-04-05 LAB — POCT URINALYSIS DIPSTICK
Blood, UA: NEGATIVE
Ketones, UA: NEGATIVE
Protein, UA: NEGATIVE
Spec Grav, UA: 1.025
Urobilinogen, UA: 0.2

## 2013-04-05 LAB — MICROALBUMIN / CREATININE URINE RATIO: Microalb Creat Ratio: 0.9 mg/g (ref 0.0–30.0)

## 2013-04-10 ENCOUNTER — Telehealth: Payer: Self-pay | Admitting: Internal Medicine

## 2013-04-10 DIAGNOSIS — B029 Zoster without complications: Secondary | ICD-10-CM

## 2013-04-10 MED ORDER — OXYCODONE-ACETAMINOPHEN 5-325 MG PO TABS: 1.0000 | ORAL_TABLET | Freq: Three times a day (TID) | ORAL | Status: DC | PRN

## 2013-04-10 NOTE — Telephone Encounter (Signed)
1 last RX only!! He will need to see neurology if symptoms persist.

## 2013-04-10 NOTE — Telephone Encounter (Signed)
Pt needs new rx oxycodone °

## 2013-04-10 NOTE — Telephone Encounter (Signed)
Pt aware, rx up front for p/u

## 2013-04-10 NOTE — Telephone Encounter (Signed)
Pt seen Padonda on 03/25/13, has not seen Dr Cato Mulligan since 2013.  Please advise

## 2013-04-12 ENCOUNTER — Encounter: Payer: Self-pay | Admitting: Internal Medicine

## 2013-04-12 ENCOUNTER — Ambulatory Visit (INDEPENDENT_AMBULATORY_CARE_PROVIDER_SITE_OTHER): Payer: 59 | Admitting: Internal Medicine

## 2013-04-12 VITALS — BP 160/102 | HR 92 | Temp 98.4°F | Ht 70.0 in | Wt 212.0 lb

## 2013-04-12 DIAGNOSIS — I10 Essential (primary) hypertension: Secondary | ICD-10-CM

## 2013-04-12 DIAGNOSIS — E785 Hyperlipidemia, unspecified: Secondary | ICD-10-CM

## 2013-04-12 DIAGNOSIS — E119 Type 2 diabetes mellitus without complications: Secondary | ICD-10-CM

## 2013-04-12 DIAGNOSIS — N481 Balanitis: Secondary | ICD-10-CM

## 2013-04-12 DIAGNOSIS — N476 Balanoposthitis: Secondary | ICD-10-CM

## 2013-04-12 DIAGNOSIS — Z0000 Encounter for general adult medical examination without abnormal findings: Secondary | ICD-10-CM

## 2013-04-12 HISTORY — DX: Balanitis: N48.1

## 2013-04-12 MED ORDER — GLIMEPIRIDE 1 MG PO TABS: 1.0000 mg | ORAL_TABLET | Freq: Every day | ORAL | Status: DC

## 2013-04-12 MED ORDER — METFORMIN HCL 1000 MG PO TABS: 1000.0000 mg | ORAL_TABLET | Freq: Two times a day (BID) | ORAL | Status: DC

## 2013-04-12 NOTE — Assessment & Plan Note (Signed)
Adequate control Continue same meds 

## 2013-04-12 NOTE — Assessment & Plan Note (Signed)
Not adequately controlled. He needs to exercise daily and lose weight

## 2013-04-12 NOTE — Assessment & Plan Note (Signed)
ldl is aove 100 Increase simvastatin

## 2013-04-12 NOTE — Assessment & Plan Note (Signed)
Discussed He needs to get DM controlled Refer urology

## 2013-04-12 NOTE — Progress Notes (Signed)
CPX  Postherpetic neuralgia  Balanitis- says he is taking a cream Past Medical History  Diagnosis Date  . Diabetes mellitus type II, controlled, with no complications   . OSA (obstructive sleep apnea)   . Hyperlipidemia   . Hypertension     History   Social History  . Marital Status: Married    Spouse Name: N/A    Number of Children: N/A  . Years of Education: N/A   Occupational History  . Not on file.   Social History Main Topics  . Smoking status: Current Some Day Smoker    Types: Cigars  . Smokeless tobacco: Current User  . Alcohol Use: Not on file  . Drug Use: Not on file  . Sexual Activity: Not on file   Other Topics Concern  . Not on file   Social History Narrative  . No narrative on file    Past Surgical History  Procedure Laterality Date  . Appendectomy    . Laparoscopic gastric banding  08/10/2009    No family history on file.  No Known Allergies  Current Outpatient Prescriptions on File Prior to Visit  Medication Sig Dispense Refill  . gabapentin (NEURONTIN) 300 MG capsule Take 1 capsule (300 mg total) by mouth 3 (three) times daily.  90 capsule  1  . Multiple Vitamin (MULTIVITAMIN WITH MINERALS) TABS tablet Take 1 tablet by mouth daily.      Marland Kitchen oxyCODONE-acetaminophen (ROXICET) 5-325 MG per tablet Take 1 tablet by mouth every 8 (eight) hours as needed.  30 tablet  0  . simvastatin (ZOCOR) 20 MG tablet Take 20 mg by mouth every morning.       No current facility-administered medications on file prior to visit.     patient denies chest pain, shortness of breath, orthopnea. Denies lower extremity edema, abdominal pain, change in appetite, change in bowel movements. Patient denies rashes, musculoskeletal complaints. No other specific complaints in a complete review of systems.   Reviewed vitals  well-developed well-nourished male in no acute distress. HEENT exam atraumatic, normocephalic, neck supple without jugular venous distention. Chest clear to  auscultation cardiac exam S1-S2 are regular. Abdominal exam overweight with bowel sounds, soft and nontender. Extremities no edema. Neurologic exam is alert with a normal gait.   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

## 2013-04-12 NOTE — Progress Notes (Signed)
Pre visit review using our clinic review tool, if applicable. No additional management support is needed unless otherwise documented below in the visit note. 

## 2013-04-15 ENCOUNTER — Telehealth: Payer: Self-pay | Admitting: Internal Medicine

## 2013-04-15 MED ORDER — GLUCOSE BLOOD VI STRP: ORAL_STRIP | Status: DC

## 2013-04-15 MED ORDER — FREESTYLE LANCETS MISC: Status: DC

## 2013-04-15 NOTE — Telephone Encounter (Signed)
rx for lancets and test strips sent in electronically

## 2013-04-15 NOTE — Telephone Encounter (Signed)
Pt needs new rxs for free style lite meter lancets and test strips call into [facility]. Pt test glucose once a day

## 2013-04-17 ENCOUNTER — Encounter: Payer: Self-pay | Admitting: Internal Medicine

## 2013-04-25 ENCOUNTER — Encounter: Payer: Self-pay | Admitting: Internal Medicine

## 2013-04-25 ENCOUNTER — Telehealth: Payer: Self-pay | Admitting: Internal Medicine

## 2013-04-25 NOTE — Telephone Encounter (Signed)
Pt thinks dr swords wanted him to have labs pre visit on 08/14/13. pls advise

## 2013-04-25 NOTE — Telephone Encounter (Signed)
lmom for pt to cb

## 2013-04-25 NOTE — Telephone Encounter (Signed)
Future labs are in the computer

## 2013-04-26 ENCOUNTER — Encounter: Payer: Self-pay | Admitting: Internal Medicine

## 2013-05-05 ENCOUNTER — Other Ambulatory Visit: Payer: Self-pay | Admitting: Internal Medicine

## 2013-05-15 NOTE — Telephone Encounter (Signed)
Lm for pt to sch labs.

## 2013-05-22 ENCOUNTER — Encounter: Payer: Self-pay | Admitting: Internal Medicine

## 2013-05-24 ENCOUNTER — Encounter: Payer: Self-pay | Admitting: Internal Medicine

## 2013-05-24 ENCOUNTER — Ambulatory Visit (INDEPENDENT_AMBULATORY_CARE_PROVIDER_SITE_OTHER): Payer: 59 | Admitting: Internal Medicine

## 2013-05-24 VITALS — BP 132/82 | HR 88 | Ht 70.0 in | Wt 211.8 lb

## 2013-05-24 DIAGNOSIS — K625 Hemorrhage of anus and rectum: Secondary | ICD-10-CM

## 2013-05-24 DIAGNOSIS — Z1211 Encounter for screening for malignant neoplasm of colon: Secondary | ICD-10-CM

## 2013-05-24 MED ORDER — MOVIPREP 100 G PO SOLR: ORAL | Status: DC

## 2013-05-24 NOTE — Progress Notes (Signed)
Patient ID: Raymond Vasquez, male   DOB: 03-Jun-1950, 63 y.o.   MRN: [id number] HPI: [name] is a 63 yo male with PMH of hypertension, hyperlipidemia, shingles earlier this year, obstructive sleep apnea and diabetes who is seen in consultation at the request of Dr. Leanne Chang to evaluate intermittent rectal bleeding and to discuss colon cancer screening.  He is here alone today.  He reports he is feeling well. He had episodes of scant rectal bleeding earlier this year that lasted for several days. He sought red blood on the toilet tissue with wiping but denies seeing blood on the stool or in the toilet water. This was painless rectal bleeding. He reports was treated with rectal ointment for several days. He denies other change in bowel habit and no significant diarrhea or constipation. No melena. No abdominal pain. No nausea or vomiting. No significant heartburn. He has had a lap band placed 4 years ago by Dr. Lucia Gaskins. He has lost about 50 pounds since this was placed.  Occasionally he will have trouble swallowing dry foods such as meats and rice, but this has not been progressive and only present since the lap band. No odynophagia.  Previous colonoscopy was performed "about 8 years ago", by Dr. Earlean Shawl  Past Medical History  Diagnosis Date  . Diabetes mellitus type II, controlled, with no complications   . OSA (obstructive sleep apnea)   . Hyperlipidemia   . Hypertension   . Shingles     2014    Past Surgical History  Procedure Laterality Date  . Appendectomy    . Laparoscopic gastric banding  08/10/2009    Current Outpatient Prescriptions  Medication Sig Dispense Refill  . glimepiride (AMARYL) 1 MG tablet Take 1 tablet (1 mg total) by mouth daily with breakfast.  90 tablet  3  . glucose blood (FREESTYLE LITE) test strip Use as instructed  100 each  12  . Lancets (FREESTYLE) lancets Use as instructed  100 each  12  . metFORMIN (GLUCOPHAGE) 1000 MG tablet Take 1 tablet (1,000 mg total) by mouth 2  (two) times daily with a meal.  180 tablet  3  . Multiple Vitamin (MULTIVITAMIN WITH MINERALS) TABS tablet Take 1 tablet by mouth daily.      . simvastatin (ZOCOR) 20 MG tablet TAKE 1 TABLET BY MOUTH EVERY DAY  90 tablet  0  . MOVIPREP 100 G SOLR Use per prep instruction  1 kit  0   No current facility-administered medications for this visit.    No Known Allergies  Family History  Problem Relation Age of Onset  . Diabetes    . Heart disease Mother     History  Substance Use Topics  . Smoking status: Current Some Day Smoker    Types: Cigars  . Smokeless tobacco: Current User  . Alcohol Use: No    ROS: As per history of present illness, otherwise negative  BP 132/82  Pulse 88  Ht $R'5\' 10"'CW$  (1.778 m)  Wt 211 lb 12.8 oz (96.072 kg)  BMI 30.39 kg/m2  SpO2 98% Constitutional: Well-developed and well-nourished. No distress. HEENT: Normocephalic and atraumatic. Oropharynx is clear and moist. No oropharyngeal exudate. Conjunctivae are normal.  No scleral icterus. Neck: Neck supple. Trachea midline. Cardiovascular: Normal rate, regular rhythm and intact distal pulses. No M/R/G Pulmonary/chest: Effort normal and breath sounds normal. No wheezing, rales or rhonchi. Abdominal: Soft, nontender, nondistended. Bowel sounds active throughout. Probable lap band port right upper quadrant.  There are no other  masses palpable. No hepatosplenomegaly. Extremities: no clubbing, cyanosis, or edema Lymphadenopathy: No cervical adenopathy noted. Neurological: Alert and oriented to person place and time. Skin: Skin is warm and dry. No rashes noted. Psychiatric: Normal mood and affect. Behavior is normal.  RELEVANT LABS AND IMAGING: CBC    Component Value Date/Time   WBC 5.1 04/05/2013 0837   RBC 5.25 04/05/2013 0837   HGB 14.8 04/05/2013 0837   HCT 45.3 04/05/2013 0837   PLT 258.0 04/05/2013 0837   MCV 86.3 04/05/2013 0837   MCH 28.1 03/18/2013 1710   MCHC 32.6 04/05/2013 0837   RDW 14.4  04/05/2013 0837   LYMPHSABS 1.0 04/05/2013 0837   MONOABS 0.5 04/05/2013 0837   EOSABS 0.2 04/05/2013 0837   BASOSABS 0.1 04/05/2013 0837    CMP     Component Value Date/Time   NA 140 04/05/2013 0837   K 4.3 04/05/2013 0837   CL 101 04/05/2013 0837   CO2 32 04/05/2013 0837   GLUCOSE 217* 04/05/2013 0837   BUN 15 04/05/2013 0837   CREATININE 0.7 04/05/2013 0837   CALCIUM 9.8 04/05/2013 0837   PROT 6.6 04/05/2013 0837   ALBUMIN 4.2 04/05/2013 0837   AST 20 04/05/2013 0837   ALT 25 04/05/2013 0837   ALKPHOS 45 04/05/2013 0837   BILITOT 0.5 04/05/2013 0837   GFRNONAA >90 03/18/2013 1710   GFRAA >90 03/18/2013 1710   Colonoscopy -- Dr. Earlie Raveling, dated 02/05/2002 -- exam to the cecum. Good prep. Assessment:  Normal examination. No colon cancer or polyps.  ASSESSMENT/PLAN: 63 yo male with PMH of hypertension, hyperlipidemia, shingles earlier this year, obstructive sleep apnea and diabetes who is seen in consultation at the request of Dr. Leanne Chang to evaluate intermittent rectal bleeding and to discuss colon cancer screening.  1.  CRC screening/scant (now-resolved) rectal bleeding -- he is due for repeat screening colonoscopy. His last exam was approximately 12 years ago.  We discussed the test today including risks and benefits and he is agreeable to proceed. His scant rectal bleeding, which is now resolved, is felt most likely to represent internal hemorrhoidal bleeding, but the colonoscopy will help rule out other pathology.  He'll continue with his current medications for now.

## 2013-05-24 NOTE — Patient Instructions (Signed)
You have been scheduled for a colonoscopy with propofol. Please follow written instructions given to you at your visit today.  Please pick up your prep kit at the pharmacy within the next 1-3 days. If you use inhalers (even only as needed), please bring them with you on the day of your procedure. Your physician has requested that you go to www.startemmi.com and enter the access code given to you at your visit today. This web site gives a general overview about your procedure. However, you should still follow specific instructions given to you by our office regarding your preparation for the procedure.                                                We are excited to introduce MyChart, a new best-in-class service that provides you online access to important information in your electronic medical record. We want to make it easier for you to view your health information - all in one secure location - when and where you need it. We expect MyChart will enhance the quality of care and service we provide.  When you register for MyChart, you can:    View your test results.    Request appointments and receive appointment reminders via email.    Request medication renewals.    View your medical history, allergies, medications and immunizations.    Communicate with your physician's office through a password-protected site.    Conveniently print information such as your medication lists.  To find out if MyChart is right for you, please talk to a member of our clinical staff today. We will gladly answer your questions about this free health and wellness tool.  If you are age 61 or older and want a member of your family to have access to your record, you must provide written consent by completing a proxy form available at our office. Please speak to our clinical staff about guidelines regarding accounts for patients younger than age 70.  As you activate your MyChart account and need any technical  assistance, please call the MyChart technical support line at [phone]) or email your question to [email or web address]. If you email your question(s), please include your name, a return phone number and the best time to reach you.  If you have non-urgent health-related questions, you can send a message to our office through [place] at [email or web address]. If you have a medical emergency, call 911.  Thank you for using MyChart as your new health and wellness resource!   MyChart licensed from [employer],  1999-2010. Patents Pending.

## 2013-05-28 ENCOUNTER — Encounter: Payer: Self-pay | Admitting: Internal Medicine

## 2013-06-12 ENCOUNTER — Encounter: Payer: Self-pay | Admitting: Internal Medicine

## 2013-06-12 ENCOUNTER — Ambulatory Visit (AMBULATORY_SURGERY_CENTER): Payer: 59 | Admitting: Internal Medicine

## 2013-06-12 VITALS — BP 129/84 | HR 72 | Temp 96.5°F | Resp 18 | Ht 70.0 in | Wt 211.0 lb

## 2013-06-12 DIAGNOSIS — Z1211 Encounter for screening for malignant neoplasm of colon: Secondary | ICD-10-CM

## 2013-06-12 DIAGNOSIS — D126 Benign neoplasm of colon, unspecified: Secondary | ICD-10-CM

## 2013-06-12 LAB — GLUCOSE, CAPILLARY
GLUCOSE-CAPILLARY: 156 mg/dL — AB (ref 70–99)
Glucose-Capillary: 118 mg/dL — ABNORMAL HIGH (ref 70–99)

## 2013-06-12 MED ORDER — SODIUM CHLORIDE 0.9 % IV SOLN: 500.0000 mL | INTRAVENOUS | Status: DC

## 2013-06-12 NOTE — Progress Notes (Signed)
Lidocaine-40mg IV prior to Propofol InductionPropofol given over incremental dosages 

## 2013-06-12 NOTE — Patient Instructions (Signed)
Information sheet given on polys.  Information sheet YOU HAD AN ENDOSCOPIC PROCEDURE TODAY AT [facility]: Refer to the procedure report that was given to you for any specific questions about what was found during the examination.  If the procedure report does not answer your questions, please call your gastroenterologist to clarify.  If you requested that your care partner not be given the details of your procedure findings, then the procedure report has been included in a sealed envelope for you to review at your convenience later.  YOU SHOULD EXPECT: Some feelings of bloating in the abdomen. Passage of more gas than usual.  Walking can help get rid of the air that was put into your GI tract during the procedure and reduce the bloating. If you had a lower endoscopy (such as a colonoscopy or flexible sigmoidoscopy) you may notice spotting of blood in your stool or on the toilet paper. If you underwent a bowel prep for your procedure, then you may not have a normal bowel movement for a few days.  DIET: Your first meal following the procedure should be a light meal and then it is ok to progress to your normal diet.  A half-sandwich or bowl of soup is an example of a good first meal.  Heavy or fried foods are harder to digest and may make you feel nauseous or bloated.  Likewise meals heavy in dairy and vegetables can cause extra gas to form and this can also increase the bloating.  Drink plenty of fluids but you should avoid alcoholic beverages for 24 hours.  ACTIVITY: Your care partner should take you home directly after the procedure.  You should plan to take it easy, moving slowly for the rest of the day.  You can resume normal activity the day after the procedure however you should NOT DRIVE or use heavy machinery for 24 hours (because of the sedation medicines used during the test).    SYMPTOMS TO REPORT IMMEDIATELY: A gastroenterologist can be reached at any hour.  During normal  business hours, 8:30 AM to 5:00 PM Monday through Friday, call [phone].  After hours and on weekends, please call the GI answering service at [phone] who will take a message and have the physician on call contact you.   Following lower endoscopy (colonoscopy or flexible sigmoidoscopy):  Excessive amounts of blood in the stool  Significant tenderness or worsening of abdominal pains  Swelling of the abdomen that is new, acute  Fever of 100F or higher  FOLLOW UP: If any biopsies were taken you will be contacted by phone or by letter within the next 1-3 weeks.  Call your gastroenterologist if you have not heard about the biopsies in 3 weeks.  Our staff will call the home number listed on your records the next business day following your procedure to check on you and address any questions or concerns that you may have at that time regarding the information given to you following your procedure. This is a courtesy call and so if there is no answer at the home number and we have not heard from you through the emergency physician on call, we will assume that you have returned to your regular daily activities without incident.  SIGNATURES/CONFIDENTIALITY: You and/or your care partner have signed paperwork which will be entered into your electronic medical record.  These signatures attest to the fact that that the information above on your After Visit Summary has been reviewed and is understood.  Full responsibility of the confidentiality of this discharge information lies with you and/or your care-partner.

## 2013-06-12 NOTE — Op Note (Signed)
[place]  [address] [place], [address]   COLONOSCOPY PROCEDURE REPORT  PATIENT: Raymond Vasquez, Raymond Vasquez  MR#: [id number] BIRTHDATE: [birth year] , 62  yrs. old GENDER: Male ENDOSCOPIST: Jerene Bears, MD REFERRED ZD:GLOVF Swords, M.D. PROCEDURE DATE:  06/12/2013 PROCEDURE:   Colonoscopy with snare polypectomy First Screening Colonoscopy - Avg.  risk and is 50 yrs.  old or older - No.  Prior Negative Screening - Now for repeat screening. 10 or more years since last screening  History of Adenoma - Now for follow-up colonoscopy & has been > or = to 3 yrs.  N/A  Polyps Removed Today? Yes. ASA CLASS:   Class II INDICATIONS:average risk screening and Last colonoscopy performed 12 years ago. MEDICATIONS: MAC sedation, administered by CRNA and propofol (Diprivan) 150mg  IV  DESCRIPTION OF PROCEDURE:   After the risks benefits and alternatives of the procedure were thoroughly explained, informed consent was obtained.  A digital rectal exam revealed no rectal mass.   The LB [id number]  endoscope was introduced through the anus and advanced to the cecum, which was identified by both the appendix and ileocecal valve. No adverse events experienced. The quality of the prep was Moviprep fair requiring copious irrigation and lavage. The instrument was then slowly withdrawn as the colon was fully examined.   COLON FINDINGS: A sessile polyp measuring 4-5 mm in size was found in the sigmoid colon.  A polypectomy was performed with a cold snare.  The resection was complete and the polyp tissue was completely retrieved.  Retroflexed views revealed external hemorrhoids. The time to cecum=5 minutes 28 seconds.  Withdrawal time=18 minutes 10 seconds.  The scope was withdrawn and the procedure completed. COMPLICATIONS: There were no complications.  ENDOSCOPIC IMPRESSION: Sessile polyp measuring 4-5 mm in size was found in the sigmoid colon; polypectomy was performed with a cold  snare  RECOMMENDATIONS: 1.  Await pathology results 2.  If the polyp removed today is proven to be an adenomatous (pre-cancerous) polyp, you will need a repeat colonoscopy in 5 years.  Otherwise you should continue to follow colorectal cancer screening guidelines for "routine risk" patients with colonoscopy in 10 years.  You will receive a letter within 1-2 weeks with the results of your biopsy as well as final recommendations.  Please call my office if you have not received a letter after 3 weeks.   eSigned:  Jerene Bears, MD 06/12/2013 11:30 AM  cc: The Patient and Lisabeth Pick, MD

## 2013-06-12 NOTE — Progress Notes (Signed)
Called to room to assist during endoscopic procedure.  Patient ID and intended procedure confirmed with present staff. Received instructions for my participation in the procedure from the performing physician.  

## 2013-06-13 ENCOUNTER — Telehealth: Payer: Self-pay | Admitting: *Deleted

## 2013-06-13 NOTE — Telephone Encounter (Signed)
  Follow up Call-  Call back number 06/12/2013  Post procedure Call Back phone  # [phone]  Permission to leave phone message Yes     Patient questions:  Do you have a fever, pain , or abdominal swelling? no Pain Score  0 *  Have you tolerated food without any problems? yes  Have you been able to return to your normal activities? yes  Do you have any questions about your discharge instructions: Diet   no Medications  no Follow up visit  no  Do you have questions or concerns about your Care? no  Actions: * If pain score is 4 or above: No action needed, pain <4.

## 2013-06-18 ENCOUNTER — Encounter (INDEPENDENT_AMBULATORY_CARE_PROVIDER_SITE_OTHER): Payer: 59 | Admitting: Surgery

## 2013-06-18 ENCOUNTER — Encounter: Payer: Self-pay | Admitting: Internal Medicine

## 2013-06-18 ENCOUNTER — Telehealth (INDEPENDENT_AMBULATORY_CARE_PROVIDER_SITE_OTHER): Payer: Self-pay | Admitting: General Surgery

## 2013-06-18 NOTE — Telephone Encounter (Signed)
Pt of Dr. Lucia Gaskins (lap band) has not been seen in several years, called to report some difficulty eating and drinking.  He states he thinks it is sitting on the area of the band and then "trickling in."  He notices is most with fluids.  Scheduled appt with Dr. Lucia Gaskins to assess.

## 2013-06-25 LAB — HM DIABETES EYE EXAM

## 2013-07-05 ENCOUNTER — Encounter (INDEPENDENT_AMBULATORY_CARE_PROVIDER_SITE_OTHER): Payer: Self-pay | Admitting: Surgery

## 2013-07-05 ENCOUNTER — Ambulatory Visit (INDEPENDENT_AMBULATORY_CARE_PROVIDER_SITE_OTHER): Payer: 59 | Admitting: Surgery

## 2013-07-05 VITALS — BP 146/102 | HR 68 | Temp 97.9°F | Resp 16 | Ht 70.0 in | Wt 215.2 lb

## 2013-07-05 DIAGNOSIS — Z9884 Bariatric surgery status: Secondary | ICD-10-CM

## 2013-07-05 HISTORY — DX: Bariatric surgery status: Z98.84

## 2013-07-05 NOTE — Progress Notes (Signed)
[place], MD,  [address]  [place], [place]    [place] Phone:  [phone] FAX:  [phone]   Re:   Raymond Vasquez DOB:   17-Sep-1950 MRN:   [id number]  ASSESSMENT AND PLAN: 1.  History of lap band placement, AP standard - D. Heidemarie Goodnow - 08/10/2009  Revision of port 07/20/2010 to a low profile port  I had not seen him in over 2 years.  I removed 1.0 cc from his port, leaving 1.0 cc.  If his symptoms improve, I will see him in a year.  We can add fluid back if he is too loose.  But if he still has symptoms, I will remove the remaining fluid and get a KUB.  2.   - Better 3.  HTN 4.  DM 5.  Remote history of kidney stones 6.  Shingles fall of 2014 - better now  This prompted a  Abdominal CT scan - I can see the the lap band looked in good position at that time.  HISTORY OF PRESENT ILLNESS: Chief Complaint  Patient presents with  . Bariatric Follow Up    having problems with holding fluids down    Raymond Vasquez is a 63 y.o. (DOB: Sep 03, 1950)  white  male who is a patient of Chancy Hurter, MD and comes to me today for difficulty eating with lap band.  His symptoms have some difficulty with eating and drinkning.  He can feel fluid empty slowly form his esophagus. He burps some.  He does not have night cough.  The has been going on several months.  He does not remember a precipitating event.  He has been happy with the lap band and his weight loss.  Abdominal CT scan - 03/18/2013 -  Lap band looks okay. Colonoscopy by Dr. Hilarie Fredrickson - 06/12/2013 - benign polyp.  He got the colonoscopy because of some rectal bleeding (which had resolved by the time he got the colonoscopy) and because of his age.  Past Medical History  Diagnosis Date  . Diabetes mellitus type II, controlled, with no complications   . OSA (obstructive sleep apnea)   . Hyperlipidemia   . Hypertension   . Shingles     2014   Past Medical  History Gastrointestinal - Colonoscopy by Dr. 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  HEENT: Normal. Pupils equal.  Neck: Supple. No mass.  No thyroid mass.   Lungs: Clear to auscultation and symmetric breath sounds. Heart:  RRR. No murmur or rub. Abdomen: Soft. No mass. No tenderness. No hernia. Normal bowel sounds. Port in the RUQ.  Procedure:  I accessed the port in the RUQ.  I removed 1.0 cc.  I left about 1.0 cc.  He still thought that the water went down a little slowly.  DATA REVIEWED: Epic notes  Alphonsa Overall, MD,  [facility], [place] [address]  [place], [place]    [place] Phone:  [phone] FAX:  [phone]

## 2013-07-10 ENCOUNTER — Ambulatory Visit: Payer: Self-pay | Admitting: Podiatry

## 2013-07-17 ENCOUNTER — Ambulatory Visit (INDEPENDENT_AMBULATORY_CARE_PROVIDER_SITE_OTHER): Payer: 59

## 2013-07-17 ENCOUNTER — Ambulatory Visit (INDEPENDENT_AMBULATORY_CARE_PROVIDER_SITE_OTHER): Payer: 59 | Admitting: Podiatry

## 2013-07-17 ENCOUNTER — Encounter: Payer: Self-pay | Admitting: Podiatry

## 2013-07-17 VITALS — BP 132/83 | HR 77 | Resp 16 | Ht 70.0 in | Wt 210.0 lb

## 2013-07-17 DIAGNOSIS — M79673 Pain in unspecified foot: Secondary | ICD-10-CM

## 2013-07-17 DIAGNOSIS — M7660 Achilles tendinitis, unspecified leg: Secondary | ICD-10-CM

## 2013-07-17 DIAGNOSIS — M79609 Pain in unspecified limb: Secondary | ICD-10-CM

## 2013-07-17 DIAGNOSIS — M722 Plantar fascial fibromatosis: Secondary | ICD-10-CM

## 2013-07-17 DIAGNOSIS — L600 Ingrowing nail: Secondary | ICD-10-CM

## 2013-07-17 MED ORDER — TRIAMCINOLONE ACETONIDE 10 MG/ML IJ SUSP
10.0000 mg | Freq: Once | INTRAMUSCULAR | Status: AC
  Administered 2013-07-17: 10 mg

## 2013-07-17 NOTE — Patient Instructions (Addendum)
Ingrown Toenail An ingrown toenail occurs when the sharp edge of a toenail grows into the skin of the groove beside the nail (lateral nail groove), causing pain. Ingrown toenails most commonly occur on the first (big) toe. If left untreated, an ingrown toenail may become inflamed and infected. The infection can even spread throughout the toe. SYMPTOMS   Pain, centered on the nail groove.  Tenderness and inflammation.  Pus drainage (occasionally).  Signs of infection: redness, swelling, pain, warm to the touch. CAUSES  Ingrown toenails are caused when the toenail grows into the neighboring fleshy nail fold. This may be the result of poor nail trimming or pressure from shoes.  RISK INCREASES WITH:   Curved nail formations.  Clipping toenails too far on the sides, which allows tissue to grow over the nail.  Poorly fitted shoes, especially those that press down on the toenails.  Sports that require sudden stops, causing the toe to jam into the shoe. PREVENTION   Trim toenails properly, making sure the sides are not clipped too short.  Wear properly fitted shoes.  Avoid excessive pressure on the toenails. PROGNOSIS  Ingrown toenails are usually curable within 1 week, depending on the presence or absence of an infection.  RELATED COMPLICATIONS   Chronic infection, that cannot be cured without surgery.  Spread of infection throughout the toe, or even the bone. TREATMENT  Treatment first involves resting from aggravating activities, wearing shoes that do not place pressure on the toenails, antibiotics (if infected), and soaking the foot in warm water (with or without Epsom salt). After the foot has been soaked, you may be able to gently lift the nail from the fleshy tissue, and place a bit of cotton ball under the nail, easing the pressure. If you have been prescribed antibiotics, expect relief to begin up to 48 hours after taking the medicine. Symptoms usually go away within 1 week. For  people who suffer from persistent (chronic) ingrown toenails, surgery may be advised. MEDICATION   If pain medicine is needed, nonsteroidal anti-inflammatory medicines (aspirin and ibuprofen), or other minor pain relievers (acetaminophen), are often advised.  Do not take pain medicine for 7 days before surgery.  Stronger pain relievers may be prescribed, if your caregiver thinks they are needed. Use only as directed and only as much as you need.  Antibiotics may be prescribed to fight infection. Take as directed by your caregiver. Finish the entire prescription, even if you start to feel better before you finish it. SOAKS AND COLD THERAPY   Soak the toe (or whole foot) for 20 minutes, twice a day, in a gallon of warm water. You may add 2 tablespoons of Epsom salts or a mild detergent.  Cold treatment (icing) should be applied for 10 to 15 minutes every 2 to 3 hours for inflammation and pain, and immediately after activity that aggravates your symptoms. Use ice packs or an ice massage. SEEK MEDICAL CARE IF:   Symptoms get worse or do not improve in 48 hours, despite treatment.  You develop fever, increased pain and swelling, or signs of infection (pain, redness, tenderness, swelling, warmth) in the toe, after surgery.  New, unexplained symptoms develop. (Drugs used in treatment may produce side effects.) Document Released: 05/09/2005 Document Revised: 08/01/2011 Document Reviewed: 08/21/2008 [facility] Patient Information 2014 [place], [place]. Diabetes and Foot Care Diabetes may cause you to have problems because of poor blood supply (circulation) to your feet and legs. This may cause the skin on your feet to become  thinner, break easier, and heal more slowly. Your skin may become dry, and the skin may peel and crack. You may also have nerve damage in your legs and feet causing decreased feeling in them. You may not notice minor injuries to your feet that could lead to infections or more  serious problems. Taking care of your feet is one of the most important things you can do for yourself.  HOME CARE INSTRUCTIONS  Wear shoes at all times, even in the house. Do not go barefoot. Bare feet are easily injured.  Check your feet daily for blisters, cuts, and redness. If you cannot see the bottom of your feet, use a mirror or ask someone for help.  Wash your feet with warm water (do not use hot water) and mild soap. Then pat your feet and the areas between your toes until they are completely dry. Do not soak your feet as this can dry your skin.  Apply a moisturizing lotion or petroleum jelly (that does not contain alcohol and is unscented) to the skin on your feet and to dry, brittle toenails. Do not apply lotion between your toes.  Trim your toenails straight across. Do not dig under them or around the cuticle. File the edges of your nails with an emery board or nail file.  Do not cut corns or calluses or try to remove them with medicine.  Wear clean socks or stockings every day. Make sure they are not too tight. Do not wear knee-high stockings since they may decrease blood flow to your legs.  Wear shoes that fit properly and have enough cushioning. To break in new shoes, wear them for just a few hours a day. This prevents you from injuring your feet. Always look in your shoes before you put them on to be sure there are no objects inside.  Do not cross your legs. This may decrease the blood flow to your feet.  If you find a minor scrape, cut, or break in the skin on your feet, keep it and the skin around it clean and dry. These areas may be cleansed with mild soap and water. Do not cleanse the area with peroxide, alcohol, or iodine.  When you remove an adhesive bandage, be sure not to damage the skin around it.  If you have a wound, look at it several times a day to make sure it is healing.  Do not use heating pads or hot water bottles. They may burn your skin. If you have lost  feeling in your feet or legs, you may not know it is happening until it is too late.  Make sure your health care provider performs a complete foot exam at least annually or more often if you have foot problems. Report any cuts, sores, or bruises to your health care provider immediately. SEEK MEDICAL CARE IF:   You have an injury that is not healing.  You have cuts or breaks in the skin.  You have an ingrown nail.  You notice redness on your legs or feet.  You feel burning or tingling in your legs or feet.  You have pain or cramps in your legs and feet.  Your legs or feet are numb.  Your feet always feel cold. SEEK IMMEDIATE MEDICAL CARE IF:   There is increasing redness, swelling, or pain in or around a wound.  There is a red line that goes up your leg.  Pus is coming from a wound.  You develop a fever  or as directed by your health care provider.  You notice a bad smell coming from an ulcer or wound. Document Released: 05/06/2000 Document Revised: 01/09/2013 Document Reviewed: 10/16/2012 [facility] Patient Information 2014 [place].  Plantar Fasciitis (Heel Spur Syndrome) with Rehab The plantar fascia is a fibrous, ligament-like, soft-tissue structure that spans the bottom of the foot. Plantar fasciitis is a condition that causes pain in the foot due to inflammation of the tissue. SYMPTOMS   Pain and tenderness on the underneath side of the foot.  Pain that worsens with standing or walking. CAUSES  Plantar fasciitis is caused by irritation and injury to the plantar fascia on the underneath side of the foot. Common mechanisms of injury include:  Direct trauma to bottom of the foot.  Damage to a small nerve that runs under the foot where the main fascia attaches to the heel bone.  Stress placed on the plantar fascia due to bone spurs. RISK INCREASES WITH:   Activities that place stress on the plantar fascia (running, jumping, pivoting, or cutting).  Poor  strength and flexibility.  Improperly fitted shoes.  Tight calf muscles.  Flat feet.  Failure to warm-up properly before activity.  Obesity. PREVENTION  Warm up and stretch properly before activity.  Allow for adequate recovery between workouts.  Maintain physical fitness:  Strength, flexibility, and endurance.  Cardiovascular fitness.  Maintain a health body weight.  Avoid stress on the plantar fascia.  Wear properly fitted shoes, including arch supports for individuals who have flat feet.  PROGNOSIS  If treated properly, then the symptoms of plantar fasciitis usually resolve without surgery. However, occasionally surgery is necessary.  RELATED COMPLICATIONS   Recurrent symptoms that may result in a chronic condition.  Problems of the lower back that are caused by compensating for the injury, such as limping.  Pain or weakness of the foot during push-off following surgery.  Chronic inflammation, scarring, and partial or complete fascia tear, occurring more often from repeated injections.  TREATMENT  Treatment initially involves the use of ice and medication to help reduce pain and inflammation. The use of strengthening and stretching exercises may help reduce pain with activity, especially stretches of the Achilles tendon. These exercises may be performed at home or with a therapist. Your caregiver may recommend that you use heel cups of arch supports to help reduce stress on the plantar fascia. Occasionally, corticosteroid injections are given to reduce inflammation. If symptoms persist for greater than 6 months despite non-surgical (conservative), then surgery may be recommended.   MEDICATION   If pain medication is necessary, then nonsteroidal anti-inflammatory medications, such as aspirin and ibuprofen, or other minor pain relievers, such as acetaminophen, are often recommended.  Do not take pain medication within 7 days before surgery.  Prescription pain  relievers may be given if deemed necessary by your caregiver. Use only as directed and only as much as you need.  Corticosteroid injections may be given by your caregiver. These injections should be reserved for the most serious cases, because they may only be given a certain number of times.  HEAT AND COLD  Cold treatment (icing) relieves pain and reduces inflammation. Cold treatment should be applied for 10 to 15 minutes every 2 to 3 hours for inflammation and pain and immediately after any activity that aggravates your symptoms. Use ice packs or massage the area with a piece of ice (ice massage).  Heat treatment may be used prior to performing the stretching and strengthening activities prescribed by your  caregiver, physical therapist, or athletic trainer. Use a heat pack or soak the injury in warm water.  SEEK IMMEDIATE MEDICAL CARE IF:  Treatment seems to offer no benefit, or the condition worsens.  Any medications produce adverse side effects.  EXERCISES- RANGE OF MOTION (ROM) AND STRETCHING EXERCISES - Plantar Fasciitis (Heel Spur Syndrome) These exercises may help you when beginning to rehabilitate your injury. Your symptoms may resolve with or without further involvement from your physician, physical therapist or athletic trainer. While completing these exercises, remember:   Restoring tissue flexibility helps normal motion to return to the joints. This allows healthier, less painful movement and activity.  An effective stretch should be held for at least 30 seconds.  A stretch should never be painful. You should only feel a gentle lengthening or release in the stretched tissue.  RANGE OF MOTION - Toe Extension, Flexion  Sit with your right / left leg crossed over your opposite knee.  Grasp your toes and gently pull them back toward the top of your foot. You should feel a stretch on the bottom of your toes and/or foot.  Hold this stretch for 10 seconds.  Now, gently pull  your toes toward the bottom of your foot. You should feel a stretch on the top of your toes and or foot.  Hold this stretch for 10 seconds. Repeat  times. Complete this stretch 3 times per day.   RANGE OF MOTION - Ankle Dorsiflexion, Active Assisted  Remove shoes and sit on a chair that is preferably not on a carpeted surface.  Place right / left foot under knee. Extend your opposite leg for support.  Keeping your heel down, slide your right / left foot back toward the chair until you feel a stretch at your ankle or calf. If you do not feel a stretch, slide your bottom forward to the edge of the chair, while still keeping your heel down.  Hold this stretch for 10 seconds. Repeat 3 times. Complete this stretch 2 times per day.   STRETCH  Gastroc, Standing  Place hands on wall.  Extend right / left leg, keeping the front knee somewhat bent.  Slightly point your toes inward on your back foot.  Keeping your right / left heel on the floor and your knee straight, shift your weight toward the wall, not allowing your back to arch.  You should feel a gentle stretch in the right / left calf. Hold this position for 10 seconds. Repeat 3 times. Complete this stretch 2 times per day.  STRETCH  Soleus, Standing  Place hands on wall.  Extend right / left leg, keeping the other knee somewhat bent.  Slightly point your toes inward on your back foot.  Keep your right / left heel on the floor, bend your back knee, and slightly shift your weight over the back leg so that you feel a gentle stretch deep in your back calf.  Hold this position for 10 seconds. Repeat 3 times. Complete this stretch 2 times per day.  STRETCH  Gastrocsoleus, Standing  Note: This exercise can place a lot of stress on your foot and ankle. Please complete this exercise only if specifically instructed by your caregiver.   Place the ball of your right / left foot on a step, keeping your other foot firmly on the same  step.  Hold on to the wall or a rail for balance.  Slowly lift your other foot, allowing your body weight to press your heel down  over the edge of the step.  You should feel a stretch in your right / left calf.  Hold this position for 10 seconds.  Repeat this exercise with a slight bend in your right / left knee. Repeat 3 times. Complete this stretch 2 times per day.   STRENGTHENING EXERCISES - Plantar Fasciitis (Heel Spur Syndrome)  These exercises may help you when beginning to rehabilitate your injury. They may resolve your symptoms with or without further involvement from your physician, physical therapist or athletic trainer. While completing these exercises, remember:   Muscles can gain both the endurance and the strength needed for everyday activities through controlled exercises.  Complete these exercises as instructed by your physician, physical therapist or athletic trainer. Progress the resistance and repetitions only as guided.  STRENGTH - Towel Curls  Sit in a chair positioned on a non-carpeted surface.  Place your foot on a towel, keeping your heel on the floor.  Pull the towel toward your heel by only curling your toes. Keep your heel on the floor. Repeat 3 times. Complete this exercise 2 times per day.  STRENGTH - Ankle Inversion  Secure one end of a rubber exercise band/tubing to a fixed object (table, pole). Loop the other end around your foot just before your toes.  Place your fists between your knees. This will focus your strengthening at your ankle.  Slowly, pull your big toe up and in, making sure the band/tubing is positioned to resist the entire motion.  Hold this position for 10 seconds.  Have your muscles resist the band/tubing as it slowly pulls your foot back to the starting position. Repeat 3 times. Complete this exercises 2 times per day.  Document Released: 05/09/2005 Document Revised: 08/01/2011 Document Reviewed: 08/21/2008 [facility] Patient  Information 2014 [place], [place].  Betadine Soak Instructions  Purchase an 8 oz. bottle of BETADINE solution (Povidone)  THE DAY AFTER THE PROCEDURE  Place 1 tablespoon of betadine solution in a quart of warm tap water.  Submerge your foot or feet with outer bandage intact for the initial soak; this will allow the bandage to become moist and wet for easy lift off.  Once you remove your bandage, continue to soak in the solution for 20 minutes.  This soak should be done twice a day.  Next, remove your foot or feet from solution, blot dry the affected area and cover.  You may use a band aid large enough to cover the area or use gauze and tape.  Apply other medications to the area as directed by the doctor such as cortisporin otic solution (ear drops) or neosporin.  IF YOUR SKIN BECOMES IRRITATED WHILE USING THESE INSTRUCTIONS, IT IS OKAY TO SWITCH TO EPSOM SALTS AND WATER OR WHITE VINEGAR AND WATER.

## 2013-07-17 NOTE — Progress Notes (Signed)
   Subjective:    Patient ID: Raymond Vasquez, male    DOB: [birth year], 63 y.o.   MRN: [id number]  HPI Comments: "I am diabetic and I have some problems with my feet"  Patient is diabetic and wants his feet checked. He has multiple concerns-  1) Possible ingrown toenails 1st bilateral medial borders. Keeps them trimmed periodically  2) Plantar heels bilateral and achilles right. Painful for 2-3 months. Previous hx heel spurs. Wears      brace.  3) Lateral side left foot. Knot. Getting worse.  4) Notices occasional swelling plantar forefoot bilateral   Diabetes      Review of Systems  Musculoskeletal: Positive for arthralgias and back pain.  Allergic/Immunologic: Positive for environmental allergies.  All other systems reviewed and are negative.       Objective:   Physical Exam        Assessment & Plan:

## 2013-07-19 NOTE — Progress Notes (Signed)
Subjective:     Patient ID: Raymond Vasquez, male   DOB: [birth year], 63 y.o.   MRN: [id number]  Diabetes   patient presents stating my ingrown toenails have been bothering me and I cannot take care of them and my heels have been sore and hurting me. Patient states that he tries to trim them himself in soak it and tries to wear a brace for the heels but it's not working stating that his sugar has been under very good control currently   Review of Systems  All other systems reviewed and are negative.       Objective:   Physical Exam  Nursing note and vitals reviewed. Constitutional: He is oriented to person, place, and time.  Cardiovascular: Intact distal pulses.   Musculoskeletal: Normal range of motion.  Neurological: He is oriented to person, place, and time.  Skin: Skin is warm.   neurovascular status intact with sharp tall and vibratory within normal limits and muscle strength and range of motion adequate. I noted incurvated medial border of the hallux bilateral with good hair growth on the toes and no indication of distal vascular disease. 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 I have recommended removal of the nail corners and patient wants to get this done and I explained the risks of procedure and healing and patient is wanting the procedures. I infiltrated each hallux 60 mg Xylocaine Marcaine mixture and remove the medial border is exposing matrix and applied phenol 3 applications 30 seconds followed by alcohol lavaged and sterile dressing. I injected the plantar heel bilateral 3 mg Kenalog 5 mg Xylocaine Marcaine mixture and instructed on stretching exercises and reappoint in the next several weeks

## 2013-07-22 ENCOUNTER — Ambulatory Visit: Payer: 59 | Admitting: Podiatry

## 2013-07-25 ENCOUNTER — Encounter: Payer: Self-pay | Admitting: Podiatry

## 2013-07-25 ENCOUNTER — Ambulatory Visit (INDEPENDENT_AMBULATORY_CARE_PROVIDER_SITE_OTHER): Payer: 59 | Admitting: Podiatry

## 2013-07-25 VITALS — BP 138/59 | HR 55 | Resp 16

## 2013-07-25 DIAGNOSIS — M7750 Other enthesopathy of unspecified foot: Secondary | ICD-10-CM

## 2013-07-25 DIAGNOSIS — M722 Plantar fascial fibromatosis: Secondary | ICD-10-CM

## 2013-07-25 MED ORDER — TRIAMCINOLONE ACETONIDE 10 MG/ML IJ SUSP
10.0000 mg | Freq: Once | INTRAMUSCULAR | Status: AC
  Administered 2013-07-25: 10 mg

## 2013-07-25 NOTE — Progress Notes (Signed)
Subjective:     Patient ID: Raymond Vasquez, male   DOB: Nov 20, 1950, 63 y.o.   MRN: [id number]  HPI patient presents stating my nails are doing better and my heels also felt better but the outside of my left foot has become increasingly sore   Review of Systems     Objective:   Physical Exam Neurovascular status intact with well-healed medial borders hallux bilateral secondary to permanent procedure and heels that are mildly tender but improved with quite a bit of discomfort at the peroneal insertion fifth metatarsal left with no muscle strength issues    Assessment:     Tendinitis lateral foot secondary to compensation in gait from heel pain    Plan:     Advised on physical therapy for heels continued soaks for ingrown toenails and injected the lateral side of the foot peroneal insertion 3 mg Kenalog 5 mg Xylocaine Marcaine mixture with reduced activity for the next several days. Reappoint her recheck

## 2013-08-03 ENCOUNTER — Encounter (HOSPITAL_COMMUNITY): Payer: Self-pay | Admitting: Emergency Medicine

## 2013-08-03 ENCOUNTER — Emergency Department (HOSPITAL_COMMUNITY)
Admission: EM | Admit: 2013-08-03 | Discharge: 2013-08-03 | Disposition: A | Discharge status: Home / Self Care | Payer: 59 | Attending: Emergency Medicine | Admitting: Emergency Medicine

## 2013-08-03 ENCOUNTER — Emergency Department (HOSPITAL_COMMUNITY): Payer: 59

## 2013-08-03 DIAGNOSIS — F17200 Nicotine dependence, unspecified, uncomplicated: Secondary | ICD-10-CM | POA: Insufficient documentation

## 2013-08-03 DIAGNOSIS — R109 Unspecified abdominal pain: Secondary | ICD-10-CM | POA: Insufficient documentation

## 2013-08-03 DIAGNOSIS — K5289 Other specified noninfective gastroenteritis and colitis: Secondary | ICD-10-CM | POA: Insufficient documentation

## 2013-08-03 DIAGNOSIS — Z9884 Bariatric surgery status: Secondary | ICD-10-CM | POA: Insufficient documentation

## 2013-08-03 DIAGNOSIS — K529 Noninfective gastroenteritis and colitis, unspecified: Secondary | ICD-10-CM

## 2013-08-03 DIAGNOSIS — Z79899 Other long term (current) drug therapy: Secondary | ICD-10-CM | POA: Insufficient documentation

## 2013-08-03 DIAGNOSIS — R61 Generalized hyperhidrosis: Secondary | ICD-10-CM | POA: Insufficient documentation

## 2013-08-03 DIAGNOSIS — R000 Tachycardia, unspecified: Secondary | ICD-10-CM | POA: Insufficient documentation

## 2013-08-03 DIAGNOSIS — R112 Nausea with vomiting, unspecified: Secondary | ICD-10-CM

## 2013-08-03 LAB — CBC WITH DIFFERENTIAL/PLATELET
Basophils Absolute: 0 10*3/uL (ref 0.0–0.1)
Basophils Relative: 0 % (ref 0–1)
Eosinophils Absolute: 0.1 10*3/uL (ref 0.0–0.7)
Eosinophils Relative: 1 % (ref 0–5)
HEMATOCRIT: 51.3 % (ref 39.0–52.0)
Hemoglobin: 17.2 g/dL — ABNORMAL HIGH (ref 13.0–17.0)
LYMPHS PCT: 4 % — AB (ref 12–46)
Lymphs Abs: 0.4 10*3/uL — ABNORMAL LOW (ref 0.7–4.0)
MCH: 28.9 pg (ref 26.0–34.0)
MCHC: 33.5 g/dL (ref 30.0–36.0)
MCV: 86.1 fL (ref 78.0–100.0)
Monocytes Absolute: 0.9 10*3/uL (ref 0.1–1.0)
Monocytes Relative: 8 % (ref 3–12)
NEUTROS ABS: 10.3 10*3/uL — AB (ref 1.7–7.7)
Neutrophils Relative %: 88 % — ABNORMAL HIGH (ref 43–77)
PLATELETS: 271 10*3/uL (ref 150–400)
RBC: 5.96 MIL/uL — ABNORMAL HIGH (ref 4.22–5.81)
RDW: 13.3 % (ref 11.5–15.5)
WBC: 11.8 10*3/uL — ABNORMAL HIGH (ref 4.0–10.5)

## 2013-08-03 LAB — COMPREHENSIVE METABOLIC PANEL
ALT: 24 U/L (ref 0–53)
AST: 17 U/L (ref 0–37)
Albumin: 4.6 g/dL (ref 3.5–5.2)
Alkaline Phosphatase: 58 U/L (ref 39–117)
BILIRUBIN TOTAL: 0.6 mg/dL (ref 0.3–1.2)
BUN: 21 mg/dL (ref 6–23)
CHLORIDE: 100 meq/L (ref 96–112)
CO2: 26 meq/L (ref 19–32)
Calcium: 9.6 mg/dL (ref 8.4–10.5)
Creatinine, Ser: 0.95 mg/dL (ref 0.50–1.35)
GFR calc Af Amer: >90 mL/min (ref >90)
GFR, EST NON AFRICAN AMERICAN: 89 mL/min — AB (ref >90)
Glucose, Bld: 270 mg/dL — ABNORMAL HIGH (ref 70–99)
Potassium: 5 mEq/L (ref 3.7–5.3)
SODIUM: 140 meq/L (ref 137–147)
Total Protein: 7.5 g/dL (ref 6.0–8.3)

## 2013-08-03 LAB — URINALYSIS, ROUTINE W REFLEX MICROSCOPIC
BILIRUBIN URINE: NEGATIVE
Glucose, UA: 250 mg/dL — AB
Hgb urine dipstick: NEGATIVE
Ketones, ur: NEGATIVE mg/dL
Leukocytes, UA: NEGATIVE
Nitrite: NEGATIVE
PROTEIN: 100 mg/dL — AB
Specific Gravity, Urine: 1.021 (ref 1.005–1.030)
UROBILINOGEN UA: 0.2 mg/dL (ref 0.0–1.0)
pH: 5.5 (ref 5.0–8.0)

## 2013-08-03 LAB — URINE MICROSCOPIC-ADD ON

## 2013-08-03 LAB — I-STAT TROPONIN, ED: TROPONIN I, POC: 0 ng/mL (ref 0.00–0.08)

## 2013-08-03 LAB — LIPASE, BLOOD: Lipase: 40 U/L (ref 11–59)

## 2013-08-03 LAB — I-STAT CG4 LACTIC ACID, ED: Lactic Acid, Venous: 2.49 mmol/L — ABNORMAL HIGH (ref 0.5–2.2)

## 2013-08-03 IMAGING — CR DG ABDOMEN ACUTE W/ 1V CHEST
5 series · 5 of 5 positions shown · non-contrast
Comparison: CT [DATE]

CLINICAL DATA: Nausea vomiting and abdominal pain. Gastric banding.

EXAM:
ACUTE ABDOMEN SERIES (ABDOMEN 2 VIEW & CHEST 1 VIEW)

[x chest ap]
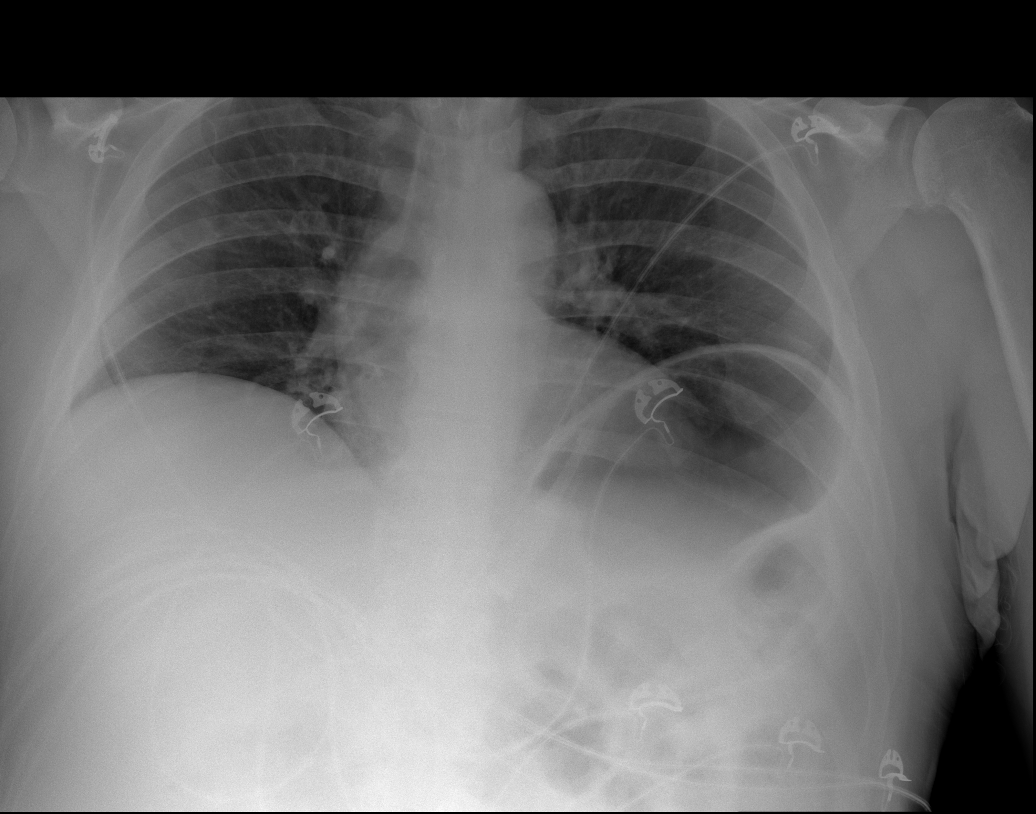

[x abdomen supine (1 of 3)]
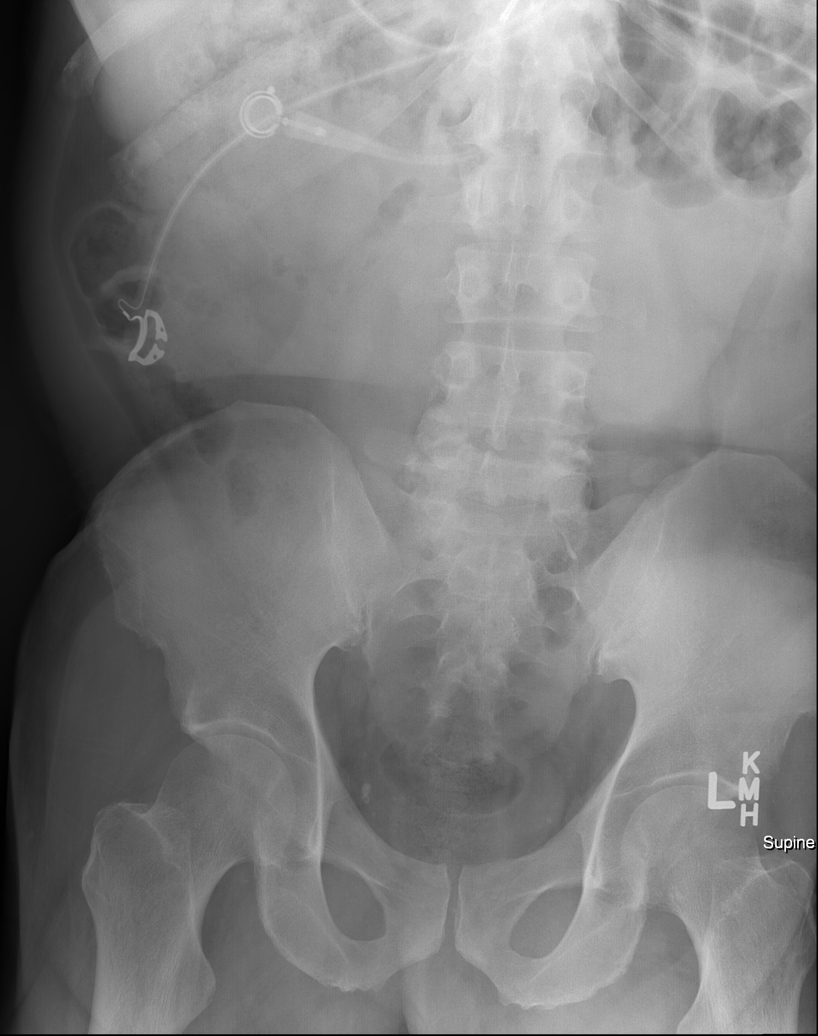

[x abdomen supine (2 of 3)]
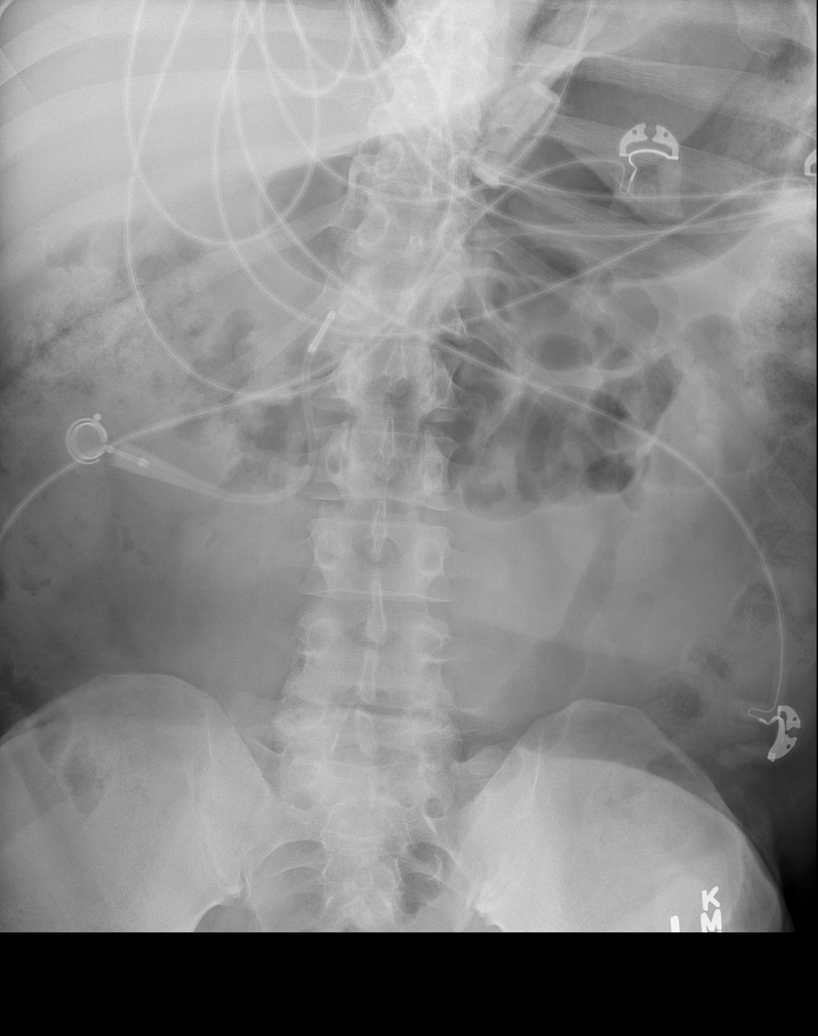

[x abdomen supine (3 of 3)]
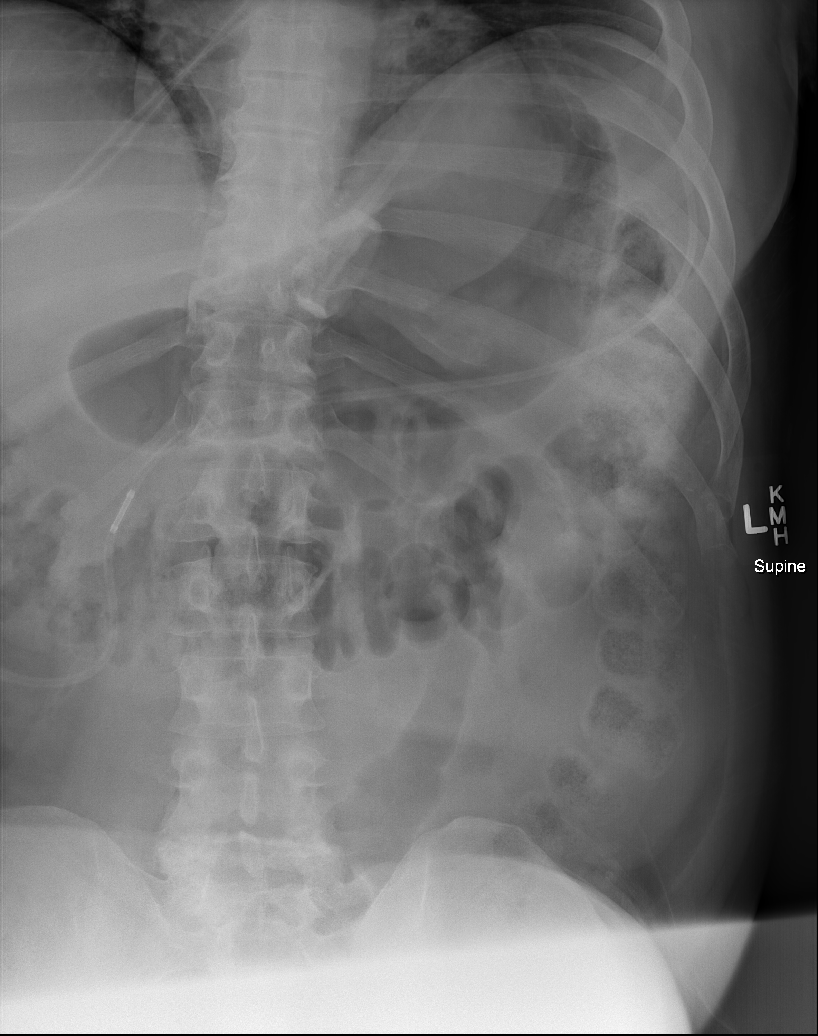

[w abdomen decub]
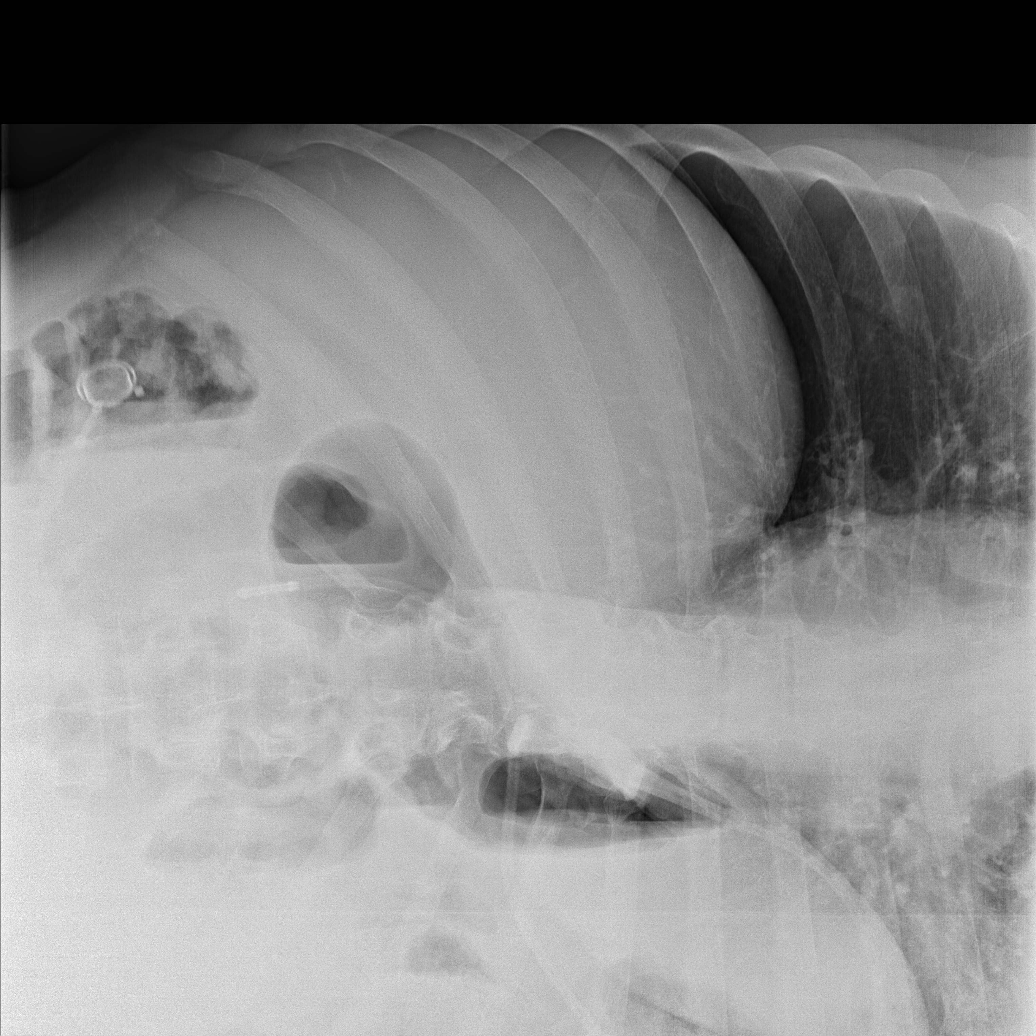

[5 of 5 positions shown; findings below may reference images not displayed]

FINDINGS: Hypoventilation with decreased lung volume. Negative for infiltrate
or effusion.

Gastric band oriented at the [DATE] to [DATE] position unchanged from
the prior CT. Tubing is continuous. Normal bowel gas pattern. No
bowel obstruction or free air.
IMPRESSION: Hypoventilation.

Normal bowel gas pattern.

Gastric band appears satisfactory.

## 2013-08-03 IMAGING — CT CT ABD-PELV W/ CM
1 of 3 series · 14 of 32 positions shown, 19 images · IV contrast (omnipaque)
Comparison: DG ABD ACUTE W/CHEST dated [DATE]; CT ABD/PELVIS W
CM dated [DATE]

CLINICAL DATA: Prior appendectomy, diffuse abdominal pain.
Vomiting.

EXAM:
CT ABDOMEN AND PELVIS WITH CONTRAST
TECHNIQUE: Multidetector CT imaging of the abdomen and pelvis was performed
using the standard protocol following bolus administration of
intravenous contrast.
CONTRAST:  50mL OMNIPAQUE IOHEXOL 300 MG/ML SOLN, 100mL OMNIPAQUE
IOHEXOL 300 MG/ML SOLN

[Series 2: abd/pel with · axial · 0.87mm/px · z∈[-480,-15]mm · 14 of 105 slices shown, 19 images]
[im 6/105  soft-tissue]
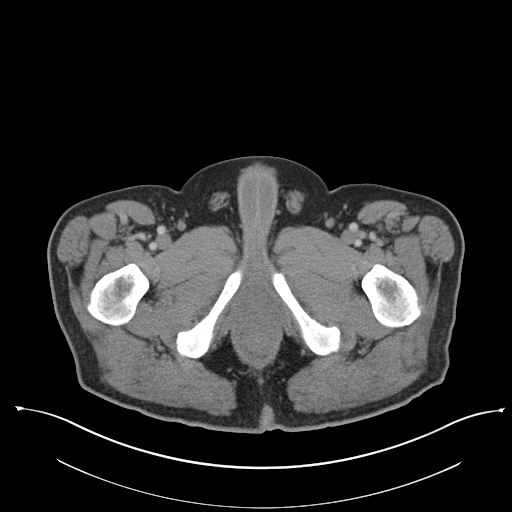
[im 6/105  bone]
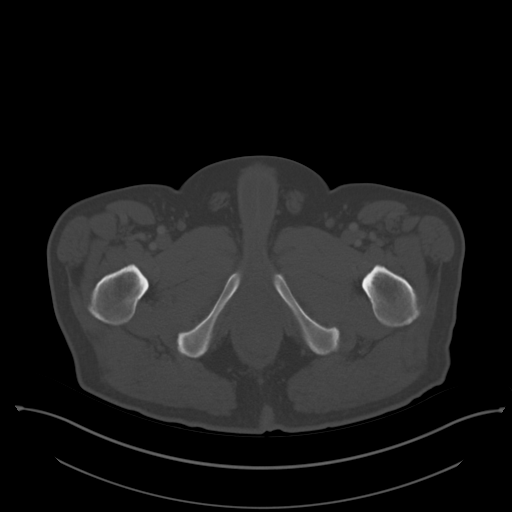
[im 17/105  soft-tissue]
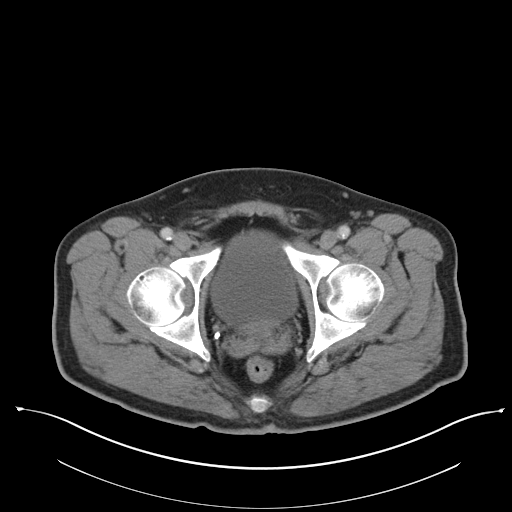
[im 22/105  soft-tissue]
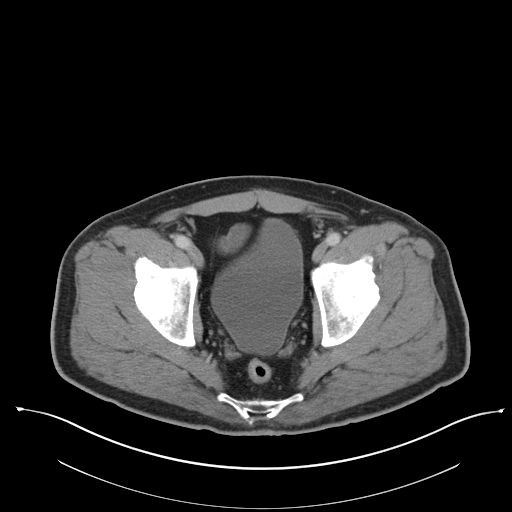
[im 28/105  soft-tissue]
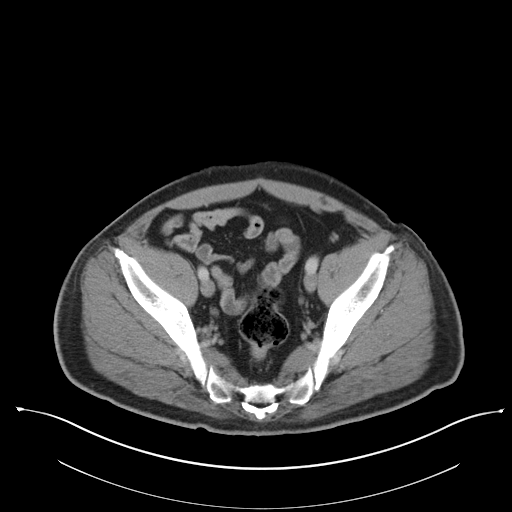
[im 39/105  soft-tissue]
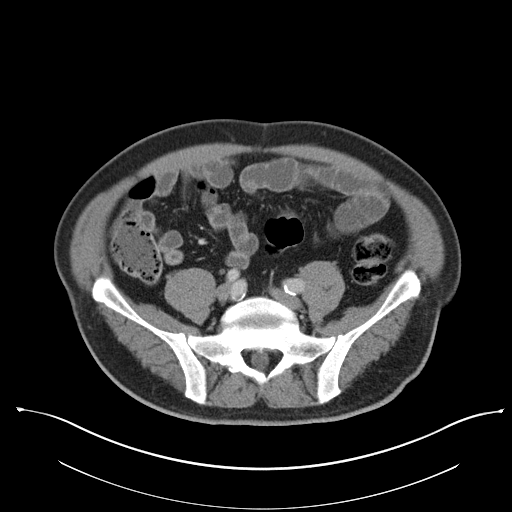
[im 44/105  soft-tissue]
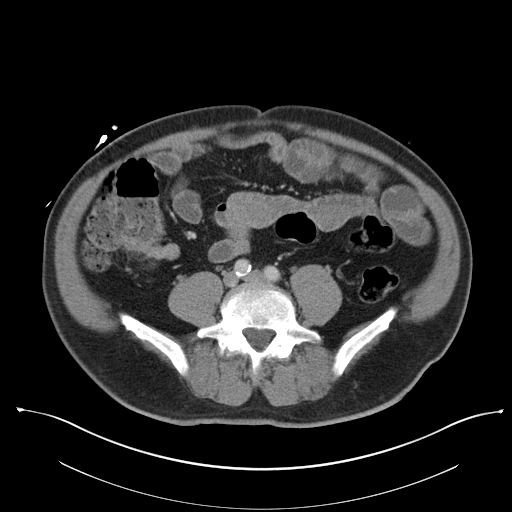
[im 55/105  soft-tissue]
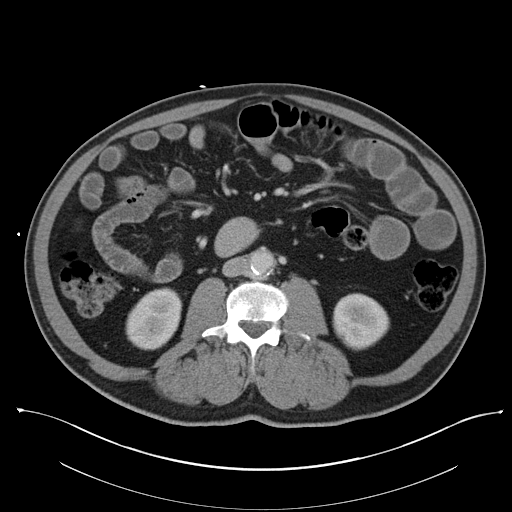
[im 61/105  soft-tissue]
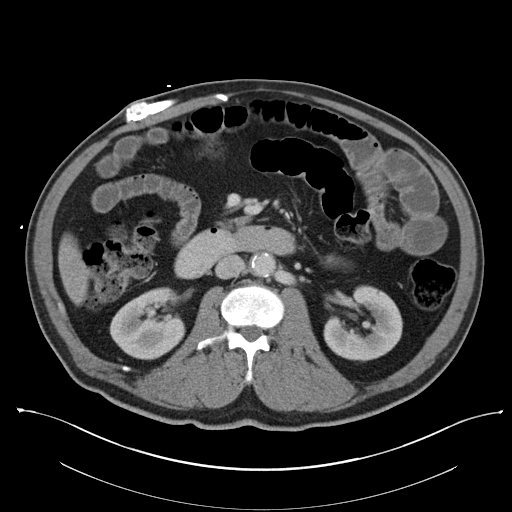
[im 66/105  soft-tissue]
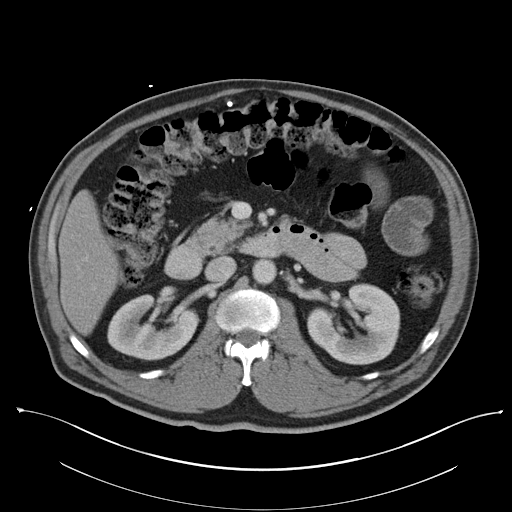
[im 66/105  bone]
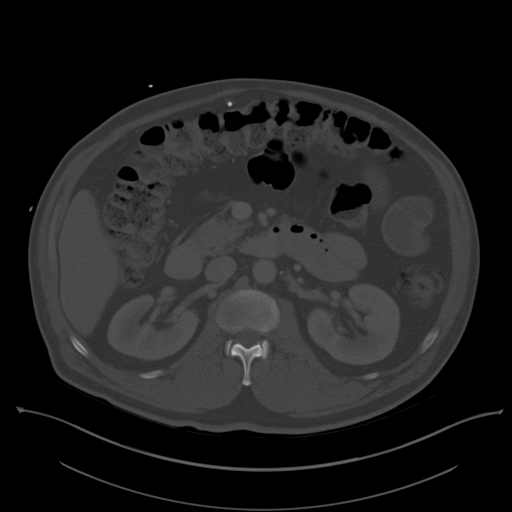
[im 77/105  soft-tissue]
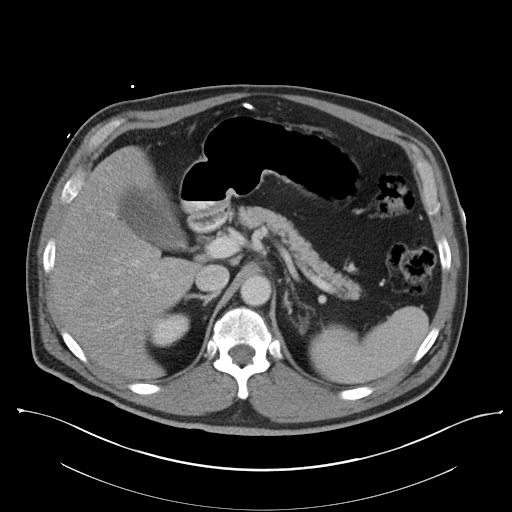
[im 83/105  soft-tissue]
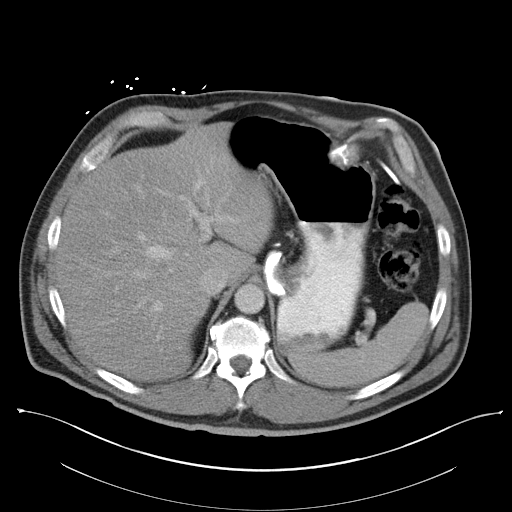
[im 83/105  lung]
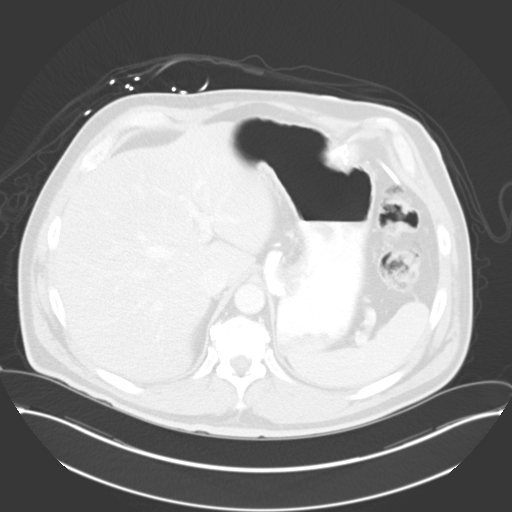
[im 88/105  soft-tissue]
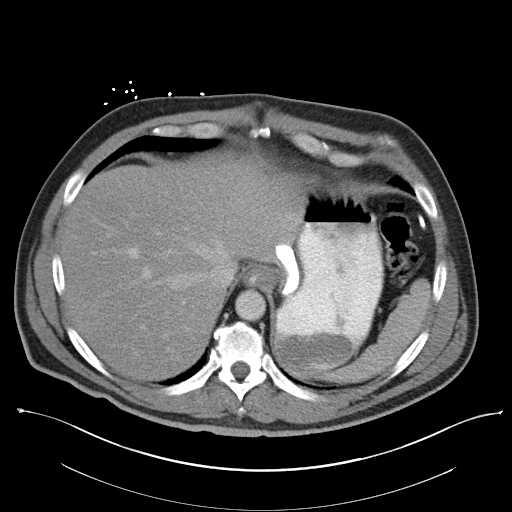
[im 88/105  lung]
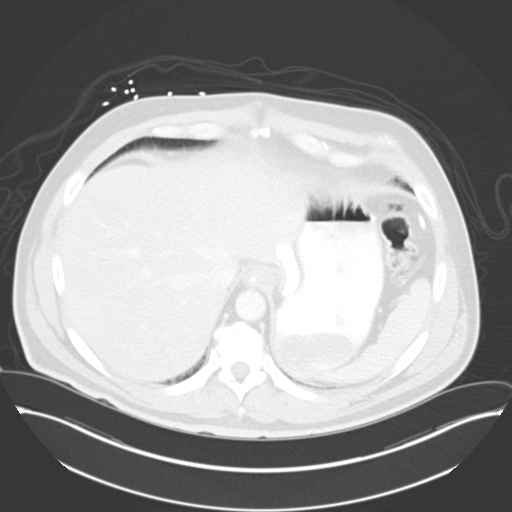
[im 94/105  lung]
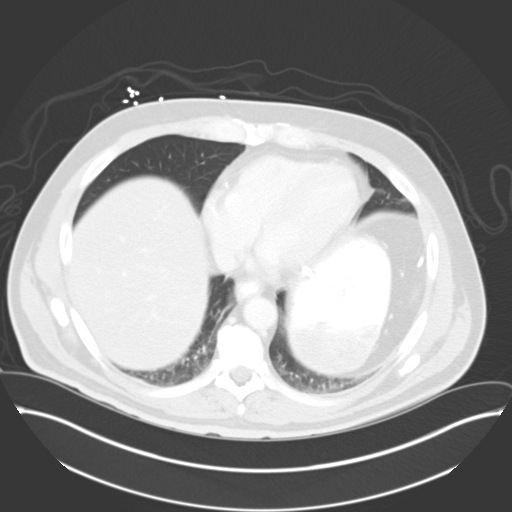
[im 99/105  soft-tissue]
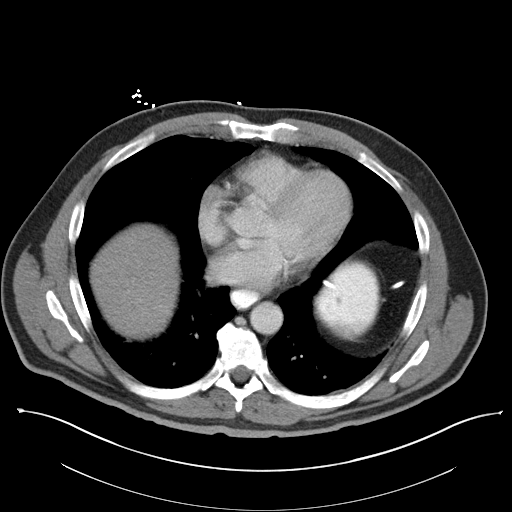
[im 99/105  lung]
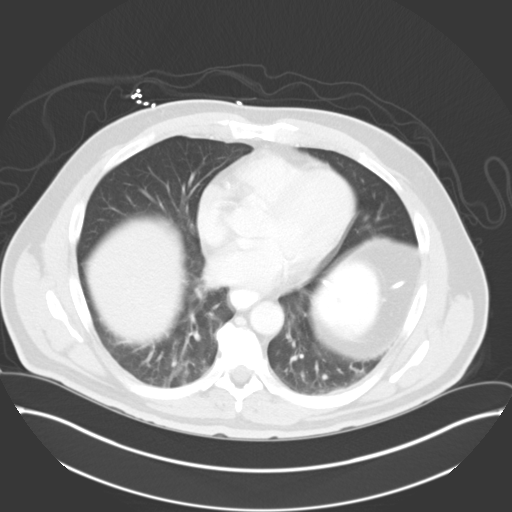

[14 of 32 positions shown; findings below may reference images not displayed]

FINDINGS: Limited view of the lung bases demonstrate mild dependent
atelectasis. Included heart and pericardium are nonsuspicious, mild
Coronary artery calcifications.

Gastric lap band in place with normal angle. Stomach, small and
large bowel are overall normal course and caliber though, there are
multiple loops of top limit size small bowel containing fluid in the
left upper quadrant, 3 cm in transaxial dimension. Mild amount of
retained large bowel stool. The appendix is not identified
consistent with surgical history.

The liver is diffusely mildly hypodense most consistent with fatty
infiltration and otherwise unremarkable. The gallbladder, spleen,
pancreas and adrenal glands are unremarkable.

Too small to characterize hypodensity in the left kidney. Delayed
imaging shows prompt symmetric excretion of contrast into the
proximal urinary collecting system. Aortoiliac vessels are normal
course and caliber with moderate intimal thickening and calcific
atherosclerosis. Urinary bladder is well distended, prostate is 5 cm
in transaxial dimension. Small fat containing inguinal hernias.
Grade 1 L5-S1 anterolisthesis on degenerative basis.
IMPRESSION: Fluid filled small bowel may reflect enteritis without transition
point or bowel obstruction.

Gastric lap band appears well seated.

Fatty liver.

  By: DOMINGA

## 2013-08-03 MED ORDER — SODIUM CHLORIDE 0.9 % IV SOLN: 1000.0000 mL | INTRAVENOUS | Status: DC

## 2013-08-03 MED ORDER — FENTANYL CITRATE 0.05 MG/ML IJ SOLN
50.0000 ug | Freq: Once | INTRAMUSCULAR | Status: AC
  Administered 2013-08-03: 50 ug via INTRAVENOUS
  Filled 2013-08-03: qty 2

## 2013-08-03 MED ORDER — IOHEXOL 300 MG/ML  SOLN
100.0000 mL | Freq: Once | INTRAMUSCULAR | Status: AC | PRN
  Administered 2013-08-03: 100 mL via INTRAVENOUS

## 2013-08-03 MED ORDER — IOHEXOL 300 MG/ML  SOLN
50.0000 mL | Freq: Once | INTRAMUSCULAR | Status: AC | PRN
  Administered 2013-08-03: 50 mL via ORAL

## 2013-08-03 MED ORDER — DICYCLOMINE HCL 20 MG PO TABS: 20.0000 mg | ORAL_TABLET | Freq: Four times a day (QID) | ORAL | Status: DC | PRN

## 2013-08-03 MED ORDER — SODIUM CHLORIDE 0.9 % IV SOLN
1000.0000 mL | Freq: Once | INTRAVENOUS | Status: AC
  Administered 2013-08-03: 1000 mL via INTRAVENOUS

## 2013-08-03 MED ORDER — ONDANSETRON 8 MG PO TBDP: 8.0000 mg | ORAL_TABLET | Freq: Three times a day (TID) | ORAL | Status: DC | PRN

## 2013-08-03 MED ORDER — ACETAMINOPHEN 325 MG PO TABS
650.0000 mg | ORAL_TABLET | Freq: Once | ORAL | Status: AC
  Administered 2013-08-03: 650 mg via ORAL
  Filled 2013-08-03: qty 2

## 2013-08-03 MED ORDER — ONDANSETRON HCL 4 MG/2ML IJ SOLN
4.0000 mg | Freq: Once | INTRAMUSCULAR | Status: AC
  Administered 2013-08-03: 4 mg via INTRAVENOUS
  Filled 2013-08-03: qty 2

## 2013-08-03 NOTE — Discharge Instructions (Signed)
Stick to a bland diet, examples are bananas, rice, tea, toast chicken noodle soup, gatorade.  Rest.  Take nausea medicine as needed.  Return emergency room for worsening condition or new concerning symptoms. You may develop diarrhea over the next 1 to 2 days.  It is best to let it run its course, but if you're having frequent episodes of diarrhea, more than 1-2 hour, it is okay to take some over-the-counter Imodium.   Nausea and Vomiting Nausea is a sick feeling that often comes before throwing up (vomiting). Vomiting is a reflex where stomach contents come out of your mouth. Vomiting can cause severe loss of body fluids (dehydration). Children and elderly adults can become dehydrated quickly, especially if they also have diarrhea. Nausea and vomiting are symptoms of a condition or disease. It is important to find the cause of your symptoms. CAUSES   Direct irritation of the stomach lining. This irritation can result from increased acid production (gastroesophageal reflux disease), infection, food poisoning, taking certain medicines (such as nonsteroidal anti-inflammatory drugs), alcohol use, or tobacco use.  Signals from the brain.These signals could be caused by a headache, heat exposure, an inner ear disturbance, increased pressure in the brain from injury, infection, a tumor, or a concussion, pain, emotional stimulus, or metabolic problems.  An obstruction in the gastrointestinal tract (bowel obstruction).  Illnesses such as diabetes, hepatitis, gallbladder problems, appendicitis, kidney problems, cancer, sepsis, atypical symptoms of a heart attack, or eating disorders.  Medical treatments such as chemotherapy and radiation.  Receiving medicine that makes you sleep (general anesthetic) during surgery. DIAGNOSIS Your caregiver may ask for tests to be done if the problems do not improve after a few days. Tests may also be done if symptoms are severe or if the reason for the nausea and vomiting  is not clear. Tests may include:  Urine tests.  Blood tests.  Stool tests.  Cultures (to look for evidence of infection).  X-rays or other imaging studies. Test results can help your caregiver make decisions about treatment or the need for additional tests. TREATMENT You need to stay well hydrated. Drink frequently but in small amounts.You may wish to drink water, sports drinks, clear broth, or eat frozen ice pops or gelatin dessert to help stay hydrated.When you eat, eating slowly may help prevent nausea.There are also some antinausea medicines that may help prevent nausea. HOME CARE INSTRUCTIONS   Take all medicine as directed by your caregiver.  If you do not have an appetite, do not force yourself to eat. However, you must continue to drink fluids.  If you have an appetite, eat a normal diet unless your caregiver tells you differently.  Eat a variety of complex carbohydrates (rice, wheat, potatoes, bread), lean meats, yogurt, fruits, and vegetables.  Avoid high-fat foods because they are more difficult to digest.  Drink enough water and fluids to keep your urine clear or pale yellow.  If you are dehydrated, ask your caregiver for specific rehydration instructions. Signs of dehydration may include:  Severe thirst.  Dry lips and mouth.  Dizziness.  Dark urine.  Decreasing urine frequency and amount.  Confusion.  Rapid breathing or pulse. SEEK IMMEDIATE MEDICAL CARE IF:   You have blood or brown flecks (like coffee grounds) in your vomit.  You have black or bloody stools.  You have a severe headache or stiff neck.  You are confused.  You have severe abdominal pain.  You have chest pain or trouble breathing.  You do not urinate at  least once every 8 hours.  You develop cold or clammy skin.  You continue to vomit for longer than 24 to 48 hours.  You have a fever. MAKE SURE YOU:   Understand these instructions.  Will watch your condition.  Will  get help right away if you are not doing well or get worse. Document Released: 05/09/2005 Document Revised: 08/01/2011 Document Reviewed: 10/06/2010 [facility] Patient Information 2014 [place], [place].  Viral Gastroenteritis Viral gastroenteritis is also known as stomach flu. This condition affects the stomach and intestinal tract. It can cause sudden diarrhea and vomiting. The illness typically lasts 3 to 8 days. Most people develop an immune response that eventually gets rid of the virus. While this natural response develops, the virus can make you quite ill. CAUSES  Many different viruses can cause gastroenteritis, such as rotavirus or noroviruses. You can catch one of these viruses by consuming contaminated food or water. You may also catch a virus by sharing utensils or other personal items with an infected person or by touching a contaminated surface. SYMPTOMS  The most common symptoms are diarrhea and vomiting. These problems can cause a severe loss of body fluids (dehydration) and a body salt (electrolyte) imbalance. Other symptoms may include:  Fever.  Headache.  Fatigue.  Abdominal pain. DIAGNOSIS  Your caregiver can usually diagnose viral gastroenteritis based on your symptoms and a physical exam. A stool sample may also be taken to test for the presence of viruses or other infections. TREATMENT  This illness typically goes away on its own. Treatments are aimed at rehydration. The most serious cases of viral gastroenteritis involve vomiting so severely that you are not able to keep fluids down. In these cases, fluids must be given through an intravenous line (IV). HOME CARE INSTRUCTIONS   Drink enough fluids to keep your urine clear or pale yellow. Drink small amounts of fluids frequently and increase the amounts as tolerated.  Ask your caregiver for specific rehydration instructions.  Avoid:  Foods high in sugar.  Alcohol.  Carbonated  drinks.  Tobacco.  Juice.  Caffeine drinks.  Extremely hot or cold fluids.  Fatty, greasy foods.  Too much intake of anything at one time.  Dairy products until 24 to 48 hours after diarrhea stops.  You may consume probiotics. Probiotics are active cultures of beneficial bacteria. They may lessen the amount and number of diarrheal stools in adults. Probiotics can be found in yogurt with active cultures and in supplements.  Wash your hands well to avoid spreading the virus.  Only take over-the-counter or prescription medicines for pain, discomfort, or fever as directed by your caregiver. Do not give aspirin to children. Antidiarrheal medicines are not recommended.  Ask your caregiver if you should continue to take your regular prescribed and over-the-counter medicines.  Keep all follow-up appointments as directed by your caregiver. SEEK IMMEDIATE MEDICAL CARE IF:   You are unable to keep fluids down.  You do not urinate at least once every 6 to 8 hours.  You develop shortness of breath.  You notice blood in your stool or vomit. This may look like coffee grounds.  You have abdominal pain that increases or is concentrated in one small area (localized).  You have persistent vomiting or diarrhea.  You have a fever.  The patient is a child younger than 3 months, and he or she has a fever.  The patient is a child older than 3 months, and he or she has a fever and persistent symptoms.  The patient is a child older than 3 months, and he or she has a fever and symptoms suddenly get worse.  The patient is a baby, and he or she has no tears when crying. MAKE SURE YOU:   Understand these instructions.  Will watch your condition.  Will get help right away if you are not doing well or get worse. Document Released: 05/09/2005 Document Revised: 08/01/2011 Document Reviewed: 02/23/2011 [facility] Patient Information 2014 [place], [place].

## 2013-08-03 NOTE — ED Notes (Signed)
[facility] ED[facility] made aware of patient CG4 Lactic results.

## 2013-08-03 NOTE — ED Notes (Signed)
Per EMS report: pt from home: pt c/o of abd pain, N/V. Sx began about 9 hours prior to arrival.  Last episode of vomiting was at 1am.  Pt c/o of diffused abd pain.  EMS did not note any masses or distention in abd.  Pt ambulatory, a/o x 4.  Skin warm and dry. Pt denies SOB, fevers, or chills.

## 2013-08-03 NOTE — ED Provider Notes (Signed)
CSN: [id number]     Arrival date & time 08/03/13  0219 History   First MD Initiated Contact with Patient 08/03/13 [phone]     Chief Complaint  Patient presents with  . Abdominal Pain     (Consider location/radiation/quality/duration/timing/severity/associated sxs/prior Treatment) HPI 63-year-old male presents to emergency room from home via EMS with complaint of nausea and vomiting.  Symptoms started around 6 PM.  He reports that he did not feel well after eating lunch, at work.  Patient reports multiple episodes of vomiting.  He feels weak and washed out.  He complains of upper abdominal pain.  Patient has history of left in place with several years ago.  He has never had to be problems with it.  He reports CT scan done not too long ago showed appropriate placement.  Patient has history of diabetes and hyperlipidemia, hypertension.  Daughter reports that patient is not at his baseline, seems confused. Past Medical History  Diagnosis Date  . Diabetes mellitus type II, controlled, with no complications   . OSA (obstructive sleep apnea)   . Hyperlipidemia   . Hypertension   . Shingles     2014   Past Surgical History  Procedure Laterality Date  . Appendectomy    . Laparoscopic gastric banding  08/10/2009  . Finger surgery  06/2012   Family History  Problem Relation Age of Onset  . Diabetes    . Heart disease Mother    History  Substance Use Topics  . Smoking status: Current Some Day Smoker    Types: Cigars  . Smokeless tobacco: Current User    Types: Chew  . Alcohol Use: No    Review of Systems   See History of Present Illness; otherwise all other systems are reviewed and negative  Allergies  Review of patient's allergies indicates no known allergies.  Home Medications   Current Outpatient Rx  Name  Route  Sig  Dispense  Refill  . glimepiride (AMARYL) 1 MG tablet   Oral   Take 1 tablet (1 mg total) by mouth daily with breakfast.   90 tablet   3   . ibuprofen  (ADVIL,MOTRIN) 200 MG tablet   Oral   Take 800 mg by mouth every 6 (six) hours as needed for moderate pain.         . metFORMIN (GLUCOPHAGE) 1000 MG tablet   Oral   Take 1 tablet (1,000 mg total) by mouth 2 (two) times daily with a meal.   180 tablet   3   . Multiple Vitamin (MULTIVITAMIN WITH MINERALS) TABS tablet   Oral   Take 1 tablet by mouth daily.         . simvastatin (ZOCOR) 20 MG tablet   Oral   Take 20 mg by mouth daily.          BP 126/74  Pulse 96  Temp(Src) 98 F (36.7 C) (Oral)  Resp 20  SpO2 91% Physical Exam  Nursing note and vitals reviewed. Constitutional: He is oriented to person, place, and time. He appears well-developed and well-nourished. He appears distressed.  HENT:  Head: Normocephalic and atraumatic.  Nose: Nose normal.  Mouth/Throat: Oropharynx is clear and moist.  Eyes: Conjunctivae and EOM are normal. Pupils are equal, round, and reactive to light.  Neck: Normal range of motion. Neck supple. No JVD present. No tracheal deviation present. No thyromegaly present.  Cardiovascular: Regular rhythm, normal heart sounds and intact distal pulses.  Exam reveals no gallop and no  friction rub.   No murmur heard. Tachycardia noted  Pulmonary/Chest: Effort normal and breath sounds normal. No stridor. No respiratory distress. He has no wheezes. He has no rales. He exhibits no tenderness.  Abdominal: Soft. Bowel sounds are normal. He exhibits no distension and no mass. There is tenderness (Diffuse tenderness without rebound or guarding). There is no rebound and no guarding.  Musculoskeletal: Normal range of motion. He exhibits no edema and no tenderness.  Lymphadenopathy:    He has no cervical adenopathy.  Neurological: He is alert and oriented to person, place, and time. He exhibits normal muscle tone. Coordination normal.  Skin: Skin is warm. No rash noted. He is diaphoretic. No erythema. There is pallor.  Psychiatric: He has a normal mood and  affect. His behavior is normal. Judgment and thought content normal.    ED Course  Procedures (including critical care time) Labs Review Labs Reviewed  CBC WITH DIFFERENTIAL - Abnormal; Notable for the following:    WBC 11.8 (*)    RBC 5.96 (*)    Hemoglobin 17.2 (*)    Neutrophils Relative % 88 (*)    Neutro Abs 10.3 (*)    Lymphocytes Relative 4 (*)    Lymphs Abs 0.4 (*)    All other components within normal limits  COMPREHENSIVE METABOLIC PANEL - Abnormal; Notable for the following:    Glucose, Bld 270 (*)    GFR calc non Af Amer 89 (*)    All other components within normal limits  URINALYSIS, ROUTINE W REFLEX MICROSCOPIC - Abnormal; Notable for the following:    Glucose, UA 250 (*)    Protein, ur 100 (*)    All other components within normal limits  I-STAT CG4 LACTIC ACID, ED - Abnormal; Notable for the following:    Lactic Acid, Venous 2.49 (*)    All other components within normal limits  LIPASE, BLOOD  URINE MICROSCOPIC-ADD ON  I-STAT TROPOININ, ED   Imaging Review Ct Abdomen Pelvis W Contrast  08/03/2013   CLINICAL DATA:  Prior appendectomy, diffuse abdominal pain. Vomiting.  EXAM: CT ABDOMEN AND PELVIS WITH CONTRAST  TECHNIQUE: Multidetector CT imaging of the abdomen and pelvis was performed using the standard protocol following bolus administration of intravenous contrast.  CONTRAST:  40mL OMNIPAQUE IOHEXOL 300 MG/ML SOLN, OMNIPAQUE IOHEXOL 300 MG/ML SOLN  COMPARISON:  DG ABD ACUTE W/CHEST dated 08/03/2013; CT ABD/PELVIS W CM dated 03/18/2013  FINDINGS: Limited view of the lung bases demonstrate mild dependent atelectasis. Included heart and pericardium are nonsuspicious, mild Coronary artery calcifications.  Gastric lap band in place with normal angle. Stomach, small and large bowel are overall normal course and caliber though, there are multiple loops of top limit size small bowel containing fluid in the left upper quadrant, 3 cm in transaxial dimension. Mild amount  of retained large bowel stool. The appendix is not identified consistent with surgical history.  The liver is diffusely mildly hypodense most consistent with fatty infiltration and otherwise unremarkable. The gallbladder, spleen, pancreas and adrenal glands are unremarkable.  Too small to characterize hypodensity in the left kidney. Delayed imaging shows prompt symmetric excretion of contrast into the proximal urinary collecting system. Aortoiliac vessels are normal course and caliber with moderate intimal thickening and calcific atherosclerosis. Urinary bladder is well distended, prostate is 5 cm in transaxial dimension. Small fat containing inguinal hernias. Grade 1 L5-S1 anterolisthesis on degenerative basis.  IMPRESSION: Fluid filled small bowel may reflect enteritis without transition point or bowel obstruction.  Gastric  lap band appears well seated.  Fatty liver.   Electronically Signed   By: Elon Alas   On: 08/03/2013 05:41   Dg Abd Acute W/chest  08/03/2013   CLINICAL DATA:  Nausea vomiting and abdominal pain. Gastric banding.  EXAM: ACUTE ABDOMEN SERIES (ABDOMEN 2 VIEW & CHEST 1 VIEW)  COMPARISON:  CT 03/18/2013  FINDINGS: Hypoventilation with decreased lung volume. Negative for infiltrate or effusion.  Gastric band oriented at the 1:00 to 7:00 position unchanged from the prior CT. Tubing is continuous. Normal bowel gas pattern. No bowel obstruction or free air.  IMPRESSION: Hypoventilation.  Normal bowel gas pattern.  Gastric band appears satisfactory.   Electronically Signed   By: Franchot Gallo M.D.   On: 08/03/2013 04:01     EKG Interpretation None      Date: 08/03/2013  Rate: 103  Rhythm: sinus tachycardia  QRS Axis: normal  Intervals: normal  ST/T Wave abnormalities: normal  Conduction Disutrbances:none  Narrative Interpretation:   Old EKG Reviewed: none available   MDM   Final diagnoses:  Enteritis  Nausea and vomiting    63 year old male with nausea and vomiting.   His lactate slightly elevated.  Acute ulcers without obstruction, and normal placement of his gastric band.  He feels slightly better after medication and fluids.  He is still having some abdominal pain.  Will proceed with CAT scan    Kalman Drape, MD 08/03/13 [phone]

## 2013-08-03 NOTE — ED Notes (Signed)
Bed: WA17 Expected date:  Expected time:  Means of arrival:  Comments: EMS 46M abd pain n/v VSS

## 2013-08-06 ENCOUNTER — Encounter: Payer: Self-pay | Admitting: Internal Medicine

## 2013-08-06 ENCOUNTER — Ambulatory Visit (INDEPENDENT_AMBULATORY_CARE_PROVIDER_SITE_OTHER): Payer: 59 | Admitting: Internal Medicine

## 2013-08-06 ENCOUNTER — Telehealth: Payer: Self-pay | Admitting: Internal Medicine

## 2013-08-06 ENCOUNTER — Ambulatory Visit: Payer: 59 | Admitting: Internal Medicine

## 2013-08-06 VITALS — BP 134/96 | HR 72 | Temp 98.0°F | Ht 70.0 in | Wt 210.0 lb

## 2013-08-06 DIAGNOSIS — J069 Acute upper respiratory infection, unspecified: Secondary | ICD-10-CM

## 2013-08-06 MED ORDER — MOMETASONE FUROATE 50 MCG/ACT NA SUSP: 2.0000 | Freq: Every day | NASAL | Status: DC

## 2013-08-06 NOTE — Progress Notes (Signed)
N/v this weekend- reviewed ed notes, labs and imaging (Fluid filled small bowel may reflect enteritis without transition point or bowel obstruction.)  Now complains sinus congestion for 3 days. No fever or chills,  Has had some drainage posterior oropharynx  Past Medical History  Diagnosis Date  . Diabetes mellitus type II, controlled, with no complications   . OSA (obstructive sleep apnea)   . Hyperlipidemia   . Hypertension   . Shingles     2014    History   Social History  . Marital Status: Married    Spouse Name: N/A    Number of Children: N/A  . Years of Education: N/A   Occupational History  . Not on file.   Social History Main Topics  . Smoking status: Current Some Day Smoker    Types: Cigars  . Smokeless tobacco: Current User    Types: Chew  . Alcohol Use: No  . Drug Use: No  . Sexual Activity: Not on file   Other Topics Concern  . Not on file   Social History Narrative  . No narrative on file    Past Surgical History  Procedure Laterality Date  . Appendectomy    . Laparoscopic gastric banding  08/10/2009  . Finger surgery  06/2012    Family History  Problem Relation Age of Onset  . Diabetes    . Heart disease Mother     No Known Allergies  Current Outpatient Prescriptions on File Prior to Visit  Medication Sig Dispense Refill  . glimepiride (AMARYL) 1 MG tablet Take 1 tablet (1 mg total) by mouth daily with breakfast.  90 tablet  3  . ibuprofen (ADVIL,MOTRIN) 200 MG tablet Take 800 mg by mouth every 6 (six) hours as needed for moderate pain.      . metFORMIN (GLUCOPHAGE) 1000 MG tablet Take 1 tablet (1,000 mg total) by mouth 2 (two) times daily with a meal.  180 tablet  3  . Multiple Vitamin (MULTIVITAMIN WITH MINERALS) TABS tablet Take 1 tablet by mouth daily.      . simvastatin (ZOCOR) 20 MG tablet Take 20 mg by mouth daily.       No current facility-administered medications on file prior to visit.     patient denies chest pain, shortness  of breath, orthopnea. Denies lower extremity edema, abdominal pain, change in appetite, change in bowel movements. Patient denies rashes, musculoskeletal complaints. No other specific complaints in a complete review of systems.   BP 134/96  Pulse 72  Temp(Src) 98 F (36.7 C) (Oral)  Ht 5\' 10"  (1.778 m)  Wt 210 lb (95.255 kg)  BMI 30.13 kg/m2  well-developed well-nourished male in no acute distress. HEENT exam atraumatic, normocephalic, neck supple without jugular venous distention. Chest clear to auscultation cardiac exam S1-S2 are regular. Abdominal exam overweight with bowel sounds, soft and nontender. Extremities no edema. Neurologic exam is alert with a normal gait.   Viral symptoms- sxs should self resolve Ok to use nasonex  (sample given)

## 2013-08-06 NOTE — Telephone Encounter (Signed)
Relevant patient education mailed to patient.  

## 2013-08-06 NOTE — Progress Notes (Signed)
Pre visit review using our clinic review tool, if applicable. No additional management support is needed unless otherwise documented below in the visit note. 

## 2013-08-08 ENCOUNTER — Encounter: Payer: Self-pay | Admitting: Internal Medicine

## 2013-08-08 ENCOUNTER — Other Ambulatory Visit (INDEPENDENT_AMBULATORY_CARE_PROVIDER_SITE_OTHER): Payer: 59

## 2013-08-08 ENCOUNTER — Ambulatory Visit (INDEPENDENT_AMBULATORY_CARE_PROVIDER_SITE_OTHER): Payer: 59 | Admitting: Internal Medicine

## 2013-08-08 VITALS — BP 142/82 | HR 77 | Temp 98.5°F | Ht 70.0 in | Wt 211.0 lb

## 2013-08-08 DIAGNOSIS — B9789 Other viral agents as the cause of diseases classified elsewhere: Principal | ICD-10-CM

## 2013-08-08 DIAGNOSIS — J069 Acute upper respiratory infection, unspecified: Secondary | ICD-10-CM

## 2013-08-08 DIAGNOSIS — J3489 Other specified disorders of nose and nasal sinuses: Secondary | ICD-10-CM

## 2013-08-08 DIAGNOSIS — E119 Type 2 diabetes mellitus without complications: Secondary | ICD-10-CM

## 2013-08-08 LAB — BASIC METABOLIC PANEL
BUN: 11 mg/dL (ref 6–23)
CO2: 27 meq/L (ref 19–32)
Calcium: 9.1 mg/dL (ref 8.4–10.5)
Chloride: 106 mEq/L (ref 96–112)
Creatinine, Ser: 0.8 mg/dL (ref 0.4–1.5)
GFR: 104.67 mL/min (ref >60.00)
GLUCOSE: 144 mg/dL — AB (ref 70–99)
POTASSIUM: 4 meq/L (ref 3.5–5.1)
Sodium: 142 mEq/L (ref 135–145)

## 2013-08-08 LAB — HEPATIC FUNCTION PANEL
ALK PHOS: 38 U/L — AB (ref 39–117)
ALT: 37 U/L (ref 0–53)
AST: 23 U/L (ref 0–37)
Albumin: 4.3 g/dL (ref 3.5–5.2)
BILIRUBIN DIRECT: 0 mg/dL (ref 0.0–0.3)
Total Bilirubin: 0.4 mg/dL (ref 0.3–1.2)
Total Protein: 6.9 g/dL (ref 6.0–8.3)

## 2013-08-08 LAB — LIPID PANEL
CHOL/HDL RATIO: 3
Cholesterol: 112 mg/dL (ref 0–200)
HDL: 33.2 mg/dL — ABNORMAL LOW (ref >39.00)
LDL CALC: 65 mg/dL (ref 0–99)
Triglycerides: 68 mg/dL (ref 0.0–149.0)
VLDL: 13.6 mg/dL (ref 0.0–40.0)

## 2013-08-08 LAB — MICROALBUMIN / CREATININE URINE RATIO
Creatinine,U: 118.7 mg/dL
Microalb Creat Ratio: 5.6 mg/g (ref 0.0–30.0)
Microalb, Ur: 6.7 mg/dL — ABNORMAL HIGH (ref 0.0–1.9)

## 2013-08-08 LAB — HEMOGLOBIN A1C: Hgb A1c MFr Bld: 7.5 % — ABNORMAL HIGH (ref 4.6–6.5)

## 2013-08-08 MED ORDER — HYDROCODONE-HOMATROPINE 5-1.5 MG/5ML PO SYRP: 5.0000 mL | ORAL_SOLUTION | ORAL | Status: DC | PRN

## 2013-08-08 NOTE — Progress Notes (Signed)
Chief Complaint  Patient presents with  . Cough    Cough is productive of a light yellow sputum.  . Nasal Congestion    HPI: Patient comes in today for SDA for  new problem evaluation.PCP DR S   See nurse note  Was in ed for enteritis had a followup 2 days ago and had some mild nasal congestion and was given Nasonex. However now he has some more nasal drainage had face pain yesterday but not today some cough but no hemoptysis shortness of breath or wheezing. Came in for lab appointment and told the nurse his symptoms to see if he should get checked out.  Non fever sob hemoptysis .  No copd hx  ROS: See pertinent positives and negatives per HPI. No more nausea vomiting diarrhea has now begun teeth more normally. Has diabetes  Past Medical History  Diagnosis Date  . Diabetes mellitus type II, controlled, with no complications   . OSA (obstructive sleep apnea)   . Hyperlipidemia   . Hypertension   . Shingles     2014    Family History  Problem Relation Age of Onset  . Diabetes    . Heart disease Mother     History   Social History  . Marital Status: Married    Spouse Name: N/A    Number of Children: N/A  . Years of Education: N/A   Social History Main Topics  . Smoking status: Current Some Day Smoker    Types: Cigars  . Smokeless tobacco: Current User    Types: Chew  . Alcohol Use: No  . Drug Use: No  . Sexual Activity: None   Other Topics Concern  . None   Social History Narrative  . None    Outpatient Encounter Prescriptions as of 08/08/2013  Medication Sig  . glimepiride (AMARYL) 1 MG tablet Take 1 tablet (1 mg total) by mouth daily with breakfast.  . ibuprofen (ADVIL,MOTRIN) 200 MG tablet Take 800 mg by mouth every 6 (six) hours as needed for moderate pain.  . metFORMIN (GLUCOPHAGE) 1000 MG tablet Take 1 tablet (1,000 mg total) by mouth 2 (two) times daily with a meal.  . mometasone (NASONEX) 50 MCG/ACT nasal spray Place 2 sprays into the nose daily.    . Multiple Vitamin (MULTIVITAMIN WITH MINERALS) TABS tablet Take 1 tablet by mouth daily.  . simvastatin (ZOCOR) 20 MG tablet Take 20 mg by mouth daily.  Marland Kitchen HYDROcodone-homatropine (HYCODAN) 5-1.5 MG/5ML syrup Take 5 mLs by mouth every 4 (four) hours as needed for cough.    EXAM:  BP 142/82  Pulse 77  Temp(Src) 98.5 F (36.9 C) (Oral)  Ht 5\' 10"  (1.778 m)  Wt 211 lb (95.709 kg)  BMI 30.28 kg/m2  SpO2 97%  Body mass index is 30.28 kg/(m^2).  GENERAL: vitals reviewed and listed above, alert, oriented, appears well hydrated and in no acute distress nontoxic mild nasal congestion HEENT: atraumatic, conjunctiva  clear, no obvious abnormalities on inspection of external nose and ears nares mucoid discharge face nontender TMs intact OP : no lesion edema or exudate missing uvula low soft palate NECK: no obvious masses on inspection palpation no adenopathy LUNGS: clear to auscultation bilaterally, no wheezes, rales or rhonchi, good air movement CV: HRRR, no clubbing cyanosis or  nl cap refill  MS: moves all extremities  PSYCH: pleasant and cooperative, no obvious depression or anxiety Reviewed ed note had elevaetd wbc lactic acid  Had ct abd  ASSESSMENT AND PLAN:  Discussed the following assessment and plan:  Viral upper respiratory tract infection with cough - Mild sinusitis symptoms expectant management although has risk of Dr. Leanne Chang contact us if fever or alarm symptoms face pain etc.  Sinus drainage  Type II or unspecified type diabetes mellitus without mention of complication, not stated as uncontrolled Could alse be evolving infection from ed? Close observation warranted but no alarm sx on exam  today -Patient advised to return or notify health care team  if symptoms worsen ,persist or new concerns arise.  Patient Instructions  Your chest is clear today and no signs  of pneumonia . This is a respiratory infection /sinus infection  Should run its course and be felling better by  next week but the cough may continue for a few weeks.  Contact  Medical team if fever  Relapsing  Shortness of breath or face pain coming back  Continue the nasonex and add saline nose spray to loosen up the mucous .  Cough med for comfort .   Sinusitis Sinusitis is redness, soreness, and swelling (inflammation) of the paranasal sinuses. Paranasal sinuses are air pockets within the bones of your face (beneath the eyes, the middle of the forehead, or above the eyes). In healthy paranasal sinuses, mucus is able to drain out, and air is able to circulate through them by way of your nose. However, when your paranasal sinuses are inflamed, mucus and air can become trapped. This can allow bacteria and other germs to grow and cause infection. Sinusitis can develop quickly and last only a short time (acute) or continue over a long period (chronic). Sinusitis that lasts for more than 12 weeks is considered chronic.  CAUSES  Causes of sinusitis include:  Allergies.  Structural abnormalities, such as displacement of the cartilage that separates your nostrils (deviated septum), which can decrease the air flow through your nose and sinuses and affect sinus drainage.  Functional abnormalities, such as when the small hairs (cilia) that line your sinuses and help remove mucus do not work properly or are not present. SYMPTOMS  Symptoms of acute and chronic sinusitis are the same. The primary symptoms are pain and pressure around the affected sinuses. Other symptoms include:  Upper toothache.  Earache.  Headache.  Bad breath.  Decreased sense of smell and taste.  A cough, which worsens when you are lying flat.  Fatigue.  Fever.  Thick drainage from your nose, which often is green and may contain pus (purulent).  Swelling and warmth over the affected sinuses. DIAGNOSIS  Your caregiver will perform a physical exam. During the exam, your caregiver may:  Look in your nose for signs of abnormal  growths in your nostrils (nasal polyps).  Tap over the affected sinus to check for signs of infection.  View the inside of your sinuses (endoscopy) with a special imaging device with a light attached (endoscope), which is inserted into your sinuses. If your caregiver suspects that you have chronic sinusitis, one or more of the following tests may be recommended:  Allergy tests.  Nasal culture A sample of mucus is taken from your nose and sent to a lab and screened for bacteria.  Nasal cytology A sample of mucus is taken from your nose and examined by your caregiver to determine if your sinusitis is related to an allergy. TREATMENT  Most cases of acute sinusitis are related to a viral infection and will resolve on their own within 10 days. Sometimes medicines are prescribed to help relieve symptoms (  pain medicine, decongestants, nasal steroid sprays, or saline sprays).  However, for sinusitis related to a bacterial infection, your caregiver will prescribe antibiotic medicines. These are medicines that will help kill the bacteria causing the infection.  Rarely, sinusitis is caused by a fungal infection. In theses cases, your caregiver will prescribe antifungal medicine. For some cases of chronic sinusitis, surgery is needed. Generally, these are cases in which sinusitis recurs more than 3 times per year, despite other treatments. HOME CARE INSTRUCTIONS   Drink plenty of water. Water helps thin the mucus so your sinuses can drain more easily.  Use a humidifier.  Inhale steam 3 to 4 times a day (for example, sit in the bathroom with the shower running).  Apply a warm, moist washcloth to your face 3 to 4 times a day, or as directed by your caregiver.  Use saline nasal sprays to help moisten and clean your sinuses.  Take over-the-counter or prescription medicines for pain, discomfort, or fever only as directed by your caregiver. SEEK IMMEDIATE MEDICAL CARE IF:  You have increasing pain or  severe headaches.  You have nausea, vomiting, or drowsiness.  You have swelling around your face.  You have vision problems.  You have a stiff neck.  You have difficulty breathing. MAKE SURE YOU:   Understand these instructions.  Will watch your condition.  Will get help right away if you are not doing well or get worse. Document Released: 05/09/2005 Document Revised: 08/01/2011 Document Reviewed: 05/24/2011 [facility] Patient Information 2014 [place], [place].       Standley Brooking. Raymond Vasquez M.D.  Pre visit review using our clinic review tool, if applicable. No additional management support is needed unless otherwise documented below in the visit note.

## 2013-08-08 NOTE — Patient Instructions (Signed)
Your chest is clear today and no signs  of pneumonia . This is a respiratory infection /sinus infection  Should run its course and be felling better by next week but the cough may continue for a few weeks.  Contact  Medical team if fever  Relapsing  Shortness of breath or face pain coming back  Continue the nasonex and add saline nose spray to loosen up the mucous .  Cough med for comfort .   Sinusitis Sinusitis is redness, soreness, and swelling (inflammation) of the paranasal sinuses. Paranasal sinuses are air pockets within the bones of your face (beneath the eyes, the middle of the forehead, or above the eyes). In healthy paranasal sinuses, mucus is able to drain out, and air is able to circulate through them by way of your nose. However, when your paranasal sinuses are inflamed, mucus and air can become trapped. This can allow bacteria and other germs to grow and cause infection. Sinusitis can develop quickly and last only a short time (acute) or continue over a long period (chronic). Sinusitis that lasts for more than 12 weeks is considered chronic.  CAUSES  Causes of sinusitis include:  Allergies.  Structural abnormalities, such as displacement of the cartilage that separates your nostrils (deviated septum), which can decrease the air flow through your nose and sinuses and affect sinus drainage.  Functional abnormalities, such as when the small hairs (cilia) that line your sinuses and help remove mucus do not work properly or are not present. SYMPTOMS  Symptoms of acute and chronic sinusitis are the same. The primary symptoms are pain and pressure around the affected sinuses. Other symptoms include:  Upper toothache.  Earache.  Headache.  Bad breath.  Decreased sense of smell and taste.  A cough, which worsens when you are lying flat.  Fatigue.  Fever.  Thick drainage from your nose, which often is green and may contain pus (purulent).  Swelling and warmth over the  affected sinuses. DIAGNOSIS  Your caregiver will perform a physical exam. During the exam, your caregiver may:  Look in your nose for signs of abnormal growths in your nostrils (nasal polyps).  Tap over the affected sinus to check for signs of infection.  View the inside of your sinuses (endoscopy) with a special imaging device with a light attached (endoscope), which is inserted into your sinuses. If your caregiver suspects that you have chronic sinusitis, one or more of the following tests may be recommended:  Allergy tests.  Nasal culture A sample of mucus is taken from your nose and sent to a lab and screened for bacteria.  Nasal cytology A sample of mucus is taken from your nose and examined by your caregiver to determine if your sinusitis is related to an allergy. TREATMENT  Most cases of acute sinusitis are related to a viral infection and will resolve on their own within 10 days. Sometimes medicines are prescribed to help relieve symptoms (pain medicine, decongestants, nasal steroid sprays, or saline sprays).  However, for sinusitis related to a bacterial infection, your caregiver will prescribe antibiotic medicines. These are medicines that will help kill the bacteria causing the infection.  Rarely, sinusitis is caused by a fungal infection. In theses cases, your caregiver will prescribe antifungal medicine. For some cases of chronic sinusitis, surgery is needed. Generally, these are cases in which sinusitis recurs more than 3 times per year, despite other treatments. HOME CARE INSTRUCTIONS   Drink plenty of water. Water helps thin the mucus so your  sinuses can drain more easily.  Use a humidifier.  Inhale steam 3 to 4 times a day (for example, sit in the bathroom with the shower running).  Apply a warm, moist washcloth to your face 3 to 4 times a day, or as directed by your caregiver.  Use saline nasal sprays to help moisten and clean your sinuses.  Take over-the-counter or  prescription medicines for pain, discomfort, or fever only as directed by your caregiver. SEEK IMMEDIATE MEDICAL CARE IF:  You have increasing pain or severe headaches.  You have nausea, vomiting, or drowsiness.  You have swelling around your face.  You have vision problems.  You have a stiff neck.  You have difficulty breathing. MAKE SURE YOU:   Understand these instructions.  Will watch your condition.  Will get help right away if you are not doing well or get worse. Document Released: 05/09/2005 Document Revised: 08/01/2011 Document Reviewed: 05/24/2011 [facility] Patient Information 2014 [place], [place].

## 2013-08-14 ENCOUNTER — Encounter: Payer: Self-pay | Admitting: Internal Medicine

## 2013-08-14 ENCOUNTER — Ambulatory Visit (INDEPENDENT_AMBULATORY_CARE_PROVIDER_SITE_OTHER): Payer: 59 | Admitting: Internal Medicine

## 2013-08-14 ENCOUNTER — Ambulatory Visit: Payer: 59 | Admitting: Internal Medicine

## 2013-08-14 VITALS — BP 130/80 | HR 76 | Temp 98.5°F | Ht 70.0 in | Wt 211.0 lb

## 2013-08-14 DIAGNOSIS — Z2911 Encounter for prophylactic immunotherapy for respiratory syncytial virus (RSV): Secondary | ICD-10-CM

## 2013-08-14 DIAGNOSIS — E119 Type 2 diabetes mellitus without complications: Secondary | ICD-10-CM

## 2013-08-14 DIAGNOSIS — E785 Hyperlipidemia, unspecified: Secondary | ICD-10-CM

## 2013-08-14 DIAGNOSIS — N476 Balanoposthitis: Secondary | ICD-10-CM

## 2013-08-14 DIAGNOSIS — N481 Balanitis: Secondary | ICD-10-CM

## 2013-08-14 DIAGNOSIS — Z23 Encounter for immunization: Secondary | ICD-10-CM

## 2013-08-14 LAB — HM DIABETES FOOT EXAM

## 2013-08-14 MED ORDER — MOMETASONE FUROATE 50 MCG/ACT NA SUSP: 2.0000 | Freq: Every day | NASAL | Status: DC

## 2013-08-14 MED ORDER — LISINOPRIL 20 MG PO TABS: 20.0000 mg | ORAL_TABLET | Freq: Every day | ORAL | Status: DC

## 2013-08-14 NOTE — Progress Notes (Signed)
patient comes in for followup of multiple medical problems including type 2 diabetes, hyperlipidemia, hypertension. The patient does not check blood sugar or blood pressure at home. The patetient does not follow an exercise or diet program. The patient denies any polyuria, polydipsia.  In the past the patient has gone to diabetic treatment center. The patient is tolerating medications  Without difficulty. The patient does admit to medication compliance.   Past Medical History  Diagnosis Date  . Diabetes mellitus type II, controlled, with no complications   . OSA (obstructive sleep apnea)   . Hyperlipidemia   . Hypertension   . Shingles     2014    History   Social History  . Marital Status: Married    Spouse Name: N/A    Number of Children: N/A  . Years of Education: N/A   Occupational History  . Not on file.   Social History Main Topics  . Smoking status: Current Some Day Smoker    Types: Cigars  . Smokeless tobacco: Current User    Types: Chew  . Alcohol Use: No  . Drug Use: No  . Sexual Activity: Not on file   Other Topics Concern  . Not on file   Social History Narrative  . No narrative on file    Past Surgical History  Procedure Laterality Date  . Appendectomy    . Laparoscopic gastric banding  08/10/2009  . Finger surgery  06/2012    Family History  Problem Relation Age of Onset  . Diabetes    . Heart disease Mother     No Known Allergies  Current Outpatient Prescriptions on File Prior to Visit  Medication Sig Dispense Refill  . glimepiride (AMARYL) 1 MG tablet Take 1 tablet (1 mg total) by mouth daily with breakfast.  90 tablet  3  . ibuprofen (ADVIL,MOTRIN) 200 MG tablet Take 800 mg by mouth every 6 (six) hours as needed for moderate pain.      . metFORMIN (GLUCOPHAGE) 1000 MG tablet Take 1 tablet (1,000 mg total) by mouth 2 (two) times daily with a meal.  180 tablet  3  . Multiple Vitamin (MULTIVITAMIN WITH MINERALS) TABS tablet Take 1 tablet by  mouth daily.      . simvastatin (ZOCOR) 20 MG tablet Take 20 mg by mouth daily.       No current facility-administered medications on file prior to visit.     patient denies chest pain, shortness of breath, orthopnea. Denies lower extremity edema, abdominal pain, change in appetite, change in bowel movements. Patient denies rashes, musculoskeletal complaints. No other specific complaints in a complete review of systems.   BP 132/98  Pulse 76  Temp(Src) 98.5 F (36.9 C) (Oral)  Ht 5\' 10"  (1.778 m)  Wt 211 lb (95.709 kg)  BMI 30.28 kg/m2  well-developed well-nourished male in no acute distress. HEENT exam atraumatic, normocephalic, neck supple without jugular venous distention. Chest clear to auscultation cardiac exam S1-S2 are regular. Abdominal exam overweight with bowel sounds, soft and nontender. Extremities no edema. Neurologic exam is alert with a normal gait.

## 2013-08-14 NOTE — Progress Notes (Signed)
Pre visit review using our clinic review tool, if applicable. No additional management support is needed unless otherwise documented below in the visit note. 

## 2013-08-15 ENCOUNTER — Other Ambulatory Visit: Payer: Self-pay | Admitting: Internal Medicine

## 2013-08-16 NOTE — Assessment & Plan Note (Signed)
Controlled.  Continue current medications.

## 2013-08-16 NOTE — Assessment & Plan Note (Signed)
He has regular followup with urology.

## 2013-08-16 NOTE — Assessment & Plan Note (Signed)
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 We'll start lisinopril. He needs to aggressively lose weight. He should exercise every day. He should follow a low-calorie diet. He has had gastric banding in the past.

## 2013-11-15 ENCOUNTER — Other Ambulatory Visit: Payer: Self-pay | Admitting: Internal Medicine

## 2014-01-24 ENCOUNTER — Ambulatory Visit: Payer: 59 | Admitting: Family

## 2014-01-24 ENCOUNTER — Ambulatory Visit: Payer: 59 | Admitting: Internal Medicine

## 2014-02-07 ENCOUNTER — Ambulatory Visit: Payer: 59 | Admitting: Family

## 2014-02-18 ENCOUNTER — Other Ambulatory Visit: Payer: Self-pay | Admitting: Internal Medicine

## 2014-03-21 ENCOUNTER — Encounter: Payer: Self-pay | Admitting: Family

## 2014-03-21 ENCOUNTER — Ambulatory Visit (INDEPENDENT_AMBULATORY_CARE_PROVIDER_SITE_OTHER): Payer: 59 | Admitting: Family

## 2014-03-21 VITALS — BP 120/88 | HR 79 | Ht 70.0 in | Wt 214.1 lb

## 2014-03-21 DIAGNOSIS — IMO0002 Reserved for concepts with insufficient information to code with codable children: Secondary | ICD-10-CM

## 2014-03-21 DIAGNOSIS — E7800 Pure hypercholesterolemia, unspecified: Secondary | ICD-10-CM

## 2014-03-21 DIAGNOSIS — Z23 Encounter for immunization: Secondary | ICD-10-CM

## 2014-03-21 DIAGNOSIS — I10 Essential (primary) hypertension: Secondary | ICD-10-CM

## 2014-03-21 DIAGNOSIS — Z9884 Bariatric surgery status: Secondary | ICD-10-CM

## 2014-03-21 DIAGNOSIS — E1165 Type 2 diabetes mellitus with hyperglycemia: Secondary | ICD-10-CM

## 2014-03-21 LAB — BASIC METABOLIC PANEL
BUN: 14 mg/dL (ref 6–23)
CO2: 31 mEq/L (ref 19–32)
Calcium: 9.9 mg/dL (ref 8.4–10.5)
Chloride: 105 mEq/L (ref 96–112)
Creatinine, Ser: 0.8 mg/dL (ref 0.4–1.5)
GFR: 100.11 mL/min (ref >60.00)
Glucose, Bld: 177 mg/dL — ABNORMAL HIGH (ref 70–99)
Potassium: 4.3 mEq/L (ref 3.5–5.1)
Sodium: 141 mEq/L (ref 135–145)

## 2014-03-21 LAB — HEPATIC FUNCTION PANEL
ALT: 21 U/L (ref 0–53)
AST: 18 U/L (ref 0–37)
Albumin: 3.9 g/dL (ref 3.5–5.2)
Alkaline Phosphatase: 43 U/L (ref 39–117)
Bilirubin, Direct: 0.1 mg/dL (ref 0.0–0.3)
Total Bilirubin: 0.6 mg/dL (ref 0.2–1.2)
Total Protein: 7.2 g/dL (ref 6.0–8.3)

## 2014-03-21 LAB — LIPID PANEL
Cholesterol: 161 mg/dL (ref 0–200)
HDL: 36.8 mg/dL — ABNORMAL LOW (ref >39.00)
LDL Cholesterol: 94 mg/dL (ref 0–99)
NONHDL: 124.2
TRIGLYCERIDES: 153 mg/dL — AB (ref 0.0–149.0)
Total CHOL/HDL Ratio: 4
VLDL: 30.6 mg/dL (ref 0.0–40.0)

## 2014-03-21 LAB — MICROALBUMIN / CREATININE URINE RATIO
CREATININE, U: 87.8 mg/dL
MICROALB/CREAT RATIO: 0.6 mg/g (ref 0.0–30.0)
Microalb, Ur: 0.5 mg/dL (ref 0.0–1.9)

## 2014-03-21 LAB — HEMOGLOBIN A1C: Hgb A1c MFr Bld: 7.6 % — ABNORMAL HIGH (ref 4.6–6.5)

## 2014-03-21 NOTE — Progress Notes (Signed)
Subjective:    Patient ID: Raymond Vasquez, male    DOB: Sep 26, 1950, 63 y.o.   MRN: [id number]  HPI  63 year old male, is to establish with me today with a history of type 2 diabetes, hypertension, hyperlipidemia. He has a history of a LAP-BAND 4-5 years ago and was taken off of insulin thereafter. Reports increased stress makes his blood glucose go up. Work is stressful lately. Reports fasting blood glucose readings 138 06/25/1983. Not currently exercising. Obtained a flu shot at work. Has seen podiatry for ingrown toenails. Feet otherwise okay.  Review of Systems  Constitutional: Negative.   HENT: Negative.   Eyes: Negative for visual disturbance.  Respiratory: Negative.   Gastrointestinal: Negative.   Endocrine: Negative.  Negative for polydipsia, polyphagia and polyuria.  Genitourinary: Negative.   Musculoskeletal: Negative.   Skin: Negative.   Allergic/Immunologic: Negative.   Neurological: Negative.   Hematological: Negative.   Psychiatric/Behavioral: Negative.    Past Medical History  Diagnosis Date  . Diabetes mellitus type II, controlled, with no complications   . OSA (obstructive sleep apnea)   . Hyperlipidemia   . Hypertension   . Shingles     2014    History   Social History  . Marital Status: Married    Spouse Name: N/A    Number of Children: N/A  . Years of Education: N/A   Occupational History  . Not on file.   Social History Main Topics  . Smoking status: Current Some Day Smoker    Types: Cigars  . Smokeless tobacco: Current User    Types: Chew  . Alcohol Use: No  . Drug Use: No  . Sexual Activity: Not on file   Other Topics Concern  . Not on file   Social History Narrative  . No narrative on file    Past Surgical History  Procedure Laterality Date  . Appendectomy    . Laparoscopic gastric banding  08/10/2009  . Finger surgery  06/2012    Family History  Problem Relation Age of Onset  . Diabetes    . Heart disease Mother     No  Known Allergies  Current Outpatient Prescriptions on File Prior to Visit  Medication Sig Dispense Refill  . glimepiride (AMARYL) 1 MG tablet Take 1 tablet (1 mg total) by mouth daily with breakfast.  90 tablet  3  . ibuprofen (ADVIL,MOTRIN) 200 MG tablet Take 800 mg by mouth every 6 (six) hours as needed for moderate pain.      Marland Kitchen lisinopril (PRINIVIL,ZESTRIL) 20 MG tablet Take 1 tablet (20 mg total) by mouth daily.  90 tablet  3  . metFORMIN (GLUCOPHAGE) 1000 MG tablet Take 1 tablet (1,000 mg total) by mouth 2 (two) times daily with a meal.  180 tablet  3  . mometasone (NASONEX) 50 MCG/ACT nasal spray Place 2 sprays into the nose daily.  17 g  12  . Multiple Vitamin (MULTIVITAMIN WITH MINERALS) TABS tablet Take 1 tablet by mouth daily.      . simvastatin (ZOCOR) 20 MG tablet TAKE 1 TABLET BY MOUTH EVERY DAY  90 tablet  0   No current facility-administered medications on file prior to visit.    BP 120/88  Pulse 79  Ht 5\' 10"  (1.778 m)  Wt 214 lb 1.6 oz (97.115 kg)  BMI 30.72 kg/m2chart    Objective:   Physical Exam  Constitutional: He is oriented to person, place, and time. He appears well-developed and well-nourished.  HENT:  Right Ear: External ear normal.  Left Ear: External ear normal.  Nose: Nose normal.  Mouth/Throat: Oropharynx is clear and moist.  Neck: Normal range of motion. Neck supple. No thyromegaly present.  Cardiovascular: Normal rate, regular rhythm and normal heart sounds.   Pulmonary/Chest: Effort normal and breath sounds normal.  Abdominal: Soft. Bowel sounds are normal.  Musculoskeletal: Normal range of motion.  Neurological: He is alert and oriented to person, place, and time. He has normal reflexes.  Skin: Skin is warm and dry.  Psychiatric: He has a normal mood and affect.          Assessment & Plan:  Raymond Vasquez was seen today for establish care.  Diagnoses and associated orders for this visit:  Essential hypertension - Basic Metabolic Panel - Hepatic  Function Panel  Pure hypercholesterolemia - Basic Metabolic Panel - Hepatic Function Panel - Lipid Panel  Diabetes type 2, uncontrolled - Hemoglobin B7J - Basic Metabolic Panel - Hepatic Function Panel - Microalbumin/Creatinine Ratio, Urine  History of laparoscopic adjustable gastric banding  Need for prophylactic vaccination against Streptococcus pneumoniae (pneumococcus) - Pneumococcal conjugate vaccine 13-valent    Call the office with any questions or concerns. Recheck in 4 months for complete physical exam. We'll follow-up pending labs and sooner as needed.

## 2014-03-21 NOTE — Progress Notes (Signed)
Pre visit review using our clinic review tool, if applicable. No additional management support is needed unless otherwise documented below in the visit note. 

## 2014-03-21 NOTE — Patient Instructions (Signed)
Diabetes and Exercise Exercising regularly is important. It is not just about losing weight. It has many health benefits, such as:  Improving your overall fitness, flexibility, and endurance.  Increasing your bone density.  Helping with weight control.  Decreasing your body fat.  Increasing your muscle strength.  Reducing stress and tension.  Improving your overall health. People with diabetes who exercise gain additional benefits because exercise:  Reduces appetite.  Improves the body's use of blood sugar (glucose).  Helps lower or control blood glucose.  Decreases blood pressure.  Helps control blood lipids (such as cholesterol and triglycerides).  Improves the body's use of the hormone insulin by:  Increasing the body's insulin sensitivity.  Reducing the body's insulin needs.  Decreases the risk for heart disease because exercising:  Lowers cholesterol and triglycerides levels.  Increases the levels of good cholesterol (such as high-density lipoproteins [HDL]) in the body.  Lowers blood glucose levels. YOUR ACTIVITY PLAN  Choose an activity that you enjoy and set realistic goals. Your health care provider or diabetes educator can help you make an activity plan that works for you. Exercise regularly as directed by your health care provider. This includes:  Performing resistance training twice a week such as push-ups, sit-ups, lifting weights, or using resistance bands.  Performing 150 minutes of cardio exercises each week such as walking, running, or playing sports.  Staying active and spending no more than 90 minutes at one time being inactive. Even short bursts of exercise are good for you. Three 10-minute sessions spread throughout the day are just as beneficial as a single 30-minute session. Some exercise ideas include:  Taking the dog for a walk.  Taking the stairs instead of the elevator.  Dancing to your favorite song.  Doing an exercise  video.  Doing your favorite exercise with a friend. RECOMMENDATIONS FOR EXERCISING WITH TYPE 1 OR TYPE 2 DIABETES   Check your blood glucose before exercising. If blood glucose levels are greater than 240 mg/dL, check for urine ketones. Do not exercise if ketones are present.  Avoid injecting insulin into areas of the body that are going to be exercised. For example, avoid injecting insulin into:  The arms when playing tennis.  The legs when jogging.  Keep a record of:  Food intake before and after you exercise.  Expected peak times of insulin action.  Blood glucose levels before and after you exercise.  The type and amount of exercise you have done.  Review your records with your health care provider. Your health care provider will help you to develop guidelines for adjusting food intake and insulin amounts before and after exercising.  If you take insulin or oral hypoglycemic agents, watch for signs and symptoms of hypoglycemia. They include:  Dizziness.  Shaking.  Sweating.  Chills.  Confusion.  Drink plenty of water while you exercise to prevent dehydration or heat stroke. Body water is lost during exercise and must be replaced.  Talk to your health care provider before starting an exercise program to make sure it is safe for you. Remember, almost any type of activity is better than none. Document Released: 07/30/2003 Document Revised: 09/23/2013 Document Reviewed: 10/16/2012 ExitCare Patient Information 2015 [employer]. This information is not intended to replace advice given to you by your health care provider. Make sure you discuss any questions you have with your health care provider.  

## 2014-03-24 ENCOUNTER — Other Ambulatory Visit: Payer: Self-pay | Admitting: Family

## 2014-03-24 ENCOUNTER — Telehealth: Payer: Self-pay | Admitting: Family

## 2014-03-24 MED ORDER — GLIMEPIRIDE 2 MG PO TABS: 2.0000 mg | ORAL_TABLET | Freq: Every day | ORAL | Status: DC

## 2014-03-24 NOTE — Telephone Encounter (Signed)
emmi mailed  °

## 2014-04-17 ENCOUNTER — Other Ambulatory Visit: Payer: Self-pay | Admitting: Internal Medicine

## 2014-04-24 ENCOUNTER — Telehealth: Payer: Self-pay | Admitting: Family

## 2014-04-24 MED ORDER — GLUCOSE BLOOD VI STRP: ORAL_STRIP | Status: DC

## 2014-04-24 NOTE — Telephone Encounter (Signed)
Pt now uses a One Touch Verio and he needs a rx for the test strips to use with that machine sent to [facility] on [address].

## 2014-04-24 NOTE — Telephone Encounter (Signed)
Done

## 2014-05-08 ENCOUNTER — Other Ambulatory Visit: Payer: Self-pay | Admitting: Internal Medicine

## 2014-05-12 ENCOUNTER — Telehealth: Payer: Self-pay | Admitting: Family

## 2014-05-12 MED ORDER — ONETOUCH DELICA LANCETS FINE MISC
Status: DC
Start: 2014-05-12 — End: 2014-05-13

## 2014-05-12 NOTE — Telephone Encounter (Signed)
Done

## 2014-05-12 NOTE — Telephone Encounter (Signed)
Patient needs a re-fill on [place].

## 2014-05-13 ENCOUNTER — Other Ambulatory Visit: Payer: Self-pay | Admitting: *Deleted

## 2014-05-13 MED ORDER — ONETOUCH DELICA LANCETS FINE MISC: Status: AC

## 2014-05-23 ENCOUNTER — Other Ambulatory Visit: Payer: Self-pay | Admitting: Internal Medicine

## 2014-07-16 ENCOUNTER — Other Ambulatory Visit: Payer: 59

## 2014-07-21 ENCOUNTER — Encounter: Payer: 59 | Admitting: Family

## 2014-08-10 ENCOUNTER — Other Ambulatory Visit: Payer: Self-pay | Admitting: Family

## 2014-08-12 ENCOUNTER — Ambulatory Visit: Payer: Self-pay | Admitting: Family Medicine

## 2014-08-24 ENCOUNTER — Other Ambulatory Visit: Payer: Self-pay | Admitting: Family

## 2014-08-25 ENCOUNTER — Other Ambulatory Visit: Payer: Self-pay

## 2014-08-25 MED ORDER — LISINOPRIL 20 MG PO TABS: 20.0000 mg | ORAL_TABLET | Freq: Every day | ORAL | Status: DC

## 2014-09-08 ENCOUNTER — Other Ambulatory Visit: Payer: Self-pay | Admitting: Family

## 2014-11-20 ENCOUNTER — Encounter: Payer: Self-pay | Admitting: Family Medicine

## 2014-11-20 ENCOUNTER — Ambulatory Visit (INDEPENDENT_AMBULATORY_CARE_PROVIDER_SITE_OTHER): Payer: 59 | Admitting: Family Medicine

## 2014-11-20 VITALS — BP 112/74 | HR 94 | Temp 98.4°F | Ht 70.0 in | Wt 212.1 lb

## 2014-11-20 DIAGNOSIS — E669 Obesity, unspecified: Secondary | ICD-10-CM

## 2014-11-20 DIAGNOSIS — Z23 Encounter for immunization: Secondary | ICD-10-CM | POA: Diagnosis not present

## 2014-11-20 DIAGNOSIS — E785 Hyperlipidemia, unspecified: Secondary | ICD-10-CM

## 2014-11-20 DIAGNOSIS — I10 Essential (primary) hypertension: Secondary | ICD-10-CM | POA: Diagnosis not present

## 2014-11-20 DIAGNOSIS — E119 Type 2 diabetes mellitus without complications: Secondary | ICD-10-CM

## 2014-11-20 DIAGNOSIS — Z7189 Other specified counseling: Secondary | ICD-10-CM

## 2014-11-20 DIAGNOSIS — Z7689 Persons encountering health services in other specified circumstances: Secondary | ICD-10-CM

## 2014-11-20 NOTE — Progress Notes (Signed)
HPI:  Raymond Vasquez is here to establish care. Used to see Dr. Leanne Chang and then [facility].  Has the following chronic problems that require follow up and concerns today:  Diabetes Mellitus: -complications: -meds: amaryl 2mg  daily, lisinopril 20mg , metformin 1000mg  bid -reports: fasting BS has been over 130s for some time -denies: polyuria, polydipsia, low blood sugars, wounds on feet, vision changes -no regular exercise, reports tries to eat healthy, has cut down on portion sizes -last eye exam: > 1 year ago -last foot exam: done, 02/2014, sees podiatrist Lab Results  Component Value Date   HGBA1C 7.6* 03/21/2014   HTN: -meds: lisinpril 20mg  daily -denies: CP, SOB, DOE  HLD: -meds: simvastatin 20mg  daily -denies: hx statin intol, leg cramps, cog changes  HM: -pneumococcal: today -HIV screen: declined -optho exam: sees opthalmology, but has been over 1 year -hgba1c  ROS negative for unless reported above: fevers, unintentional weight loss, hearing or vision loss, chest pain, palpitations, struggling to breath, hemoptysis, melena, hematochezia, hematuria, falls, loc, si, thoughts of self harm  Past Medical History  Diagnosis Date  . Diabetes mellitus type II, controlled, with no complications   . OSA (obstructive sleep apnea)   . Hyperlipidemia   . Hypertension   . Shingles     2014  . 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 Angioedema     he is unsure of this or cause  . Nephrolithiasis     1980s    Past Surgical History  Procedure Laterality Date  . Appendectomy    . Laparoscopic gastric banding  08/10/2009  . Finger surgery  06/2012    Family History  Problem Relation Age of Onset  . Diabetes    . Heart disease Mother     History   Social History  . Marital Status: Married    Spouse Name: N/A  . Number of Children: N/A  . Years of Education: N/A   Social History Main Topics  . Smoking  status: Current Some Day Smoker    Types: Cigars  . Smokeless tobacco: Current User    Types: Chew     Comment: 1 cigar occassionally, not more then 1x per week  . Alcohol Use: No  . Drug Use: No  . Sexual Activity: Not on file   Other Topics Concern  . 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  glimepiride (AMARYL) 2 MG tablet, TAKE 1 TABLET BY MOUTH EVERY DAY WITH BREAKFAST, Disp: 90 tablet, Rfl: 0 .  glucose blood (ONETOUCH VERIO) test strip, Use as instructed, Disp: 100 each, Rfl: 12 .  lisinopril (PRINIVIL,ZESTRIL) 20 MG tablet, Take 1 tablet (20 mg total) by mouth daily., Disp: 90 tablet, Rfl: 0 .  metFORMIN (GLUCOPHAGE) 1000 MG tablet, TAKE 1 TABLET BY MOUTH TWICE DAILY WITH MEAL, Disp: 180 tablet, Rfl: 0 .  Multiple Vitamin (MULTIVITAMIN WITH MINERALS) TABS tablet, Take 1 tablet by mouth daily., Disp: , Rfl:  .  ONETOUCH DELICA LANCETS FINE MISC, Use as instructed by provider, Disp: 200 each, Rfl: 3 .  simvastatin (ZOCOR) 20 MG tablet, TAKE 1 TABLET BY MOUTH DAILY, Disp: 90 tablet, Rfl: 0 .  ibuprofen (ADVIL,MOTRIN) 200 MG tablet, Take 800 mg by mouth every 6 (six) hours as needed for moderate pain., Disp: , Rfl:   EXAM:  Filed Vitals:   11/20/14 1604  BP: 112/74  Pulse: 94  Temp: 98.4 F (36.9 C)    Body mass index is 30.43 kg/(m^2).  GENERAL: vitals reviewed and listed above, alert, oriented, appears well hydrated and in no acute distress  HEENT: atraumatic, conjunttiva clear, no obvious abnormalities on inspection of external nose and ears  NECK: no obvious masses on inspection  LUNGS: clear to auscultation bilaterally, no wheezes, rales or rhonchi, good air movement  CV: HRRR, no peripheral edema  MS: moves all extremities without noticeable  abnormality  PSYCH: pleasant and cooperative, no obvious depression or anxiety  ASSESSMENT AND PLAN:  Discussed the following assessment and plan:  Type 2 diabetes mellitus without complication -check labs -increase metformin -lifestyle recs  Hyperlipidemia   Essential hypertension  Obesity  Encounter to establish care -We reviewed the PMH, PSH, FH, SH, Meds and Allergies. -We provided refills for any medications we will prescribe as needed. -We addressed current concerns per orders and patient instructions. -We have asked for records for pertinent exams, studies, vaccines and notes from previous providers. -We have advised patient to follow up per instructions below.  Advised the following HM measures: HM: -pneumococcal -HIV screen -optho exam -hgba1c   -Patient advised to return or notify a doctor immediately if symptoms worsen or persist or new concerns arise.  Patient Instructions  BEFORE YOU LEAVE: -labs -schedule physical in 3 months - come fasting and we will check your cholesteorol that day -pneumonia vaccine  Schedule your diabetic eye exam - call your opthomologist in the next few days to set this up.  Increase the metformin to twice daily  FOR YOUR DIABETES:  []  Eat a healthy low carb diet (avoid sweets, sweet drinks, breads, potatoes, rice, etc.) and ensure 3 small meals daily.  []  Get AT LEAST 150 minutes of cardiovascular exercise per week - 30 minutes per day is best of sustained sweaty exercise.  []  Take all of your medications every day as directed by your doctor. Call your doctor immediately if you have any questions about your medications or are running low.  []  Check your blood sugar often and when you feel unwell and keep a log to bring to all health appointments. FASTING: before you eat anything in the morning POSTPRANDIAL: 1-2 hours after a meal  []  If any low blood sugars < 70, eat a snack and call your doctor immediately.  []  See  an eye doctor every year and fax your diabetic eye exam to our office.  Fax: [phone]  []  Take good care of your feet and keep them soft and callus free. Check your feet daily and wear comfortable shoes. See your doctor immediately if you have any cuts, calluses or wounds on your feet.  You may find this book helpful for your diabetes. It is available on [place] and is fairly inexpensive.  Dr. Janene Harvey Program for Reversing Diabetes: The Scientifically Proven System for Reversing Diabetes without Drugs  http://www.[facility].org/index.cfm       Colin Benton R.

## 2014-11-20 NOTE — Addendum Note (Signed)
Addended by: Agnes Lawrence on: 11/20/2014 04:55 PM   Modules accepted: Orders

## 2014-11-20 NOTE — Progress Notes (Signed)
Pre visit review using our clinic review tool, if applicable. No additional management support is needed unless otherwise documented below in the visit note. 

## 2014-11-20 NOTE — Patient Instructions (Addendum)
BEFORE YOU LEAVE: -labs -schedule physical in 3 months - come fasting and we will check your cholesteorol that day -pneumonia vaccine  Schedule your diabetic eye exam - call your opthomologist in the next few days to set this up.  Increase the metformin to twice daily  FOR YOUR DIABETES:  []  Eat a healthy low carb diet (avoid sweets, sweet drinks, breads, potatoes, rice, etc.) and ensure 3 small meals daily.  []  Get AT LEAST 150 minutes of cardiovascular exercise per week - 30 minutes per day is best of sustained sweaty exercise.  []  Take all of your medications every day as directed by your doctor. Call your doctor immediately if you have any questions about your medications or are running low.  []  Check your blood sugar often and when you feel unwell and keep a log to bring to all health appointments. FASTING: before you eat anything in the morning POSTPRANDIAL: 1-2 hours after a meal  []  If any low blood sugars < 70, eat a snack and call your doctor immediately.  []  See an eye doctor every year and fax your diabetic eye exam to our office.  Fax: [phone]  []  Take good care of your feet and keep them soft and callus free. Check your feet daily and wear comfortable shoes. See your doctor immediately if you have any cuts, calluses or wounds on your feet.  You may find this book helpful for your diabetes. It is available on [place] and is fairly inexpensive.  Dr. Janene Harvey Program for Reversing Diabetes: The Scientifically Proven System for Reversing Diabetes without Drugs  http://www.[facility]al[facility].org/index.cfm

## 2014-11-21 LAB — BASIC METABOLIC PANEL
BUN: 13 mg/dL (ref 6–23)
CHLORIDE: 104 meq/L (ref 96–112)
CO2: 25 mEq/L (ref 19–32)
CREATININE: 0.8 mg/dL (ref 0.40–1.50)
Calcium: 9.9 mg/dL (ref 8.4–10.5)
GFR: 104.22 mL/min (ref >60.00)
Glucose, Bld: 122 mg/dL — ABNORMAL HIGH (ref 70–99)
POTASSIUM: 4.3 meq/L (ref 3.5–5.1)
SODIUM: 141 meq/L (ref 135–145)

## 2014-11-21 LAB — HEMOGLOBIN A1C: HEMOGLOBIN A1C: 7.1 % — AB (ref 4.6–6.5)

## 2014-11-23 ENCOUNTER — Other Ambulatory Visit: Payer: Self-pay | Admitting: Family

## 2014-11-25 ENCOUNTER — Other Ambulatory Visit: Payer: Self-pay | Admitting: Family Medicine

## 2014-11-25 MED ORDER — GLIMEPIRIDE 2 MG PO TABS: 4.0000 mg | ORAL_TABLET | Freq: Every day | ORAL | Status: DC

## 2014-12-08 ENCOUNTER — Telehealth: Payer: Self-pay | Admitting: Family Medicine

## 2014-12-08 MED ORDER — SIMVASTATIN 20 MG PO TABS: 20.0000 mg | ORAL_TABLET | Freq: Every day | ORAL | Status: DC

## 2014-12-08 MED ORDER — LISINOPRIL 20 MG PO TABS: 20.0000 mg | ORAL_TABLET | Freq: Every day | ORAL | Status: DC

## 2014-12-08 NOTE — Telephone Encounter (Signed)
Pt needs refill on simvastatin and lisinopril call into [facility]. Pharm did not get the req from 11-25-14

## 2014-12-08 NOTE — Telephone Encounter (Signed)
Rxs done. 

## 2014-12-09 ENCOUNTER — Telehealth: Payer: Self-pay | Admitting: Family Medicine

## 2014-12-09 MED ORDER — GLIMEPIRIDE 2 MG PO TABS: 4.0000 mg | ORAL_TABLET | Freq: Every day | ORAL | Status: DC

## 2014-12-09 NOTE — Telephone Encounter (Signed)
Pt needs a refill on amaryl 2 mg. Pt is take 2 pills (4 mg) please call into [facility]

## 2014-12-09 NOTE — Telephone Encounter (Signed)
Rx done. 

## 2015-02-06 ENCOUNTER — Other Ambulatory Visit: Payer: Self-pay | Admitting: Family

## 2015-03-05 ENCOUNTER — Ambulatory Visit (INDEPENDENT_AMBULATORY_CARE_PROVIDER_SITE_OTHER): Payer: 59 | Admitting: Family Medicine

## 2015-03-05 ENCOUNTER — Encounter: Payer: Self-pay | Admitting: Family Medicine

## 2015-03-05 VITALS — BP 112/78 | HR 73 | Temp 97.9°F | Ht 70.0 in | Wt 221.5 lb

## 2015-03-05 DIAGNOSIS — E669 Obesity, unspecified: Secondary | ICD-10-CM | POA: Diagnosis not present

## 2015-03-05 DIAGNOSIS — E119 Type 2 diabetes mellitus without complications: Secondary | ICD-10-CM

## 2015-03-05 DIAGNOSIS — E785 Hyperlipidemia, unspecified: Secondary | ICD-10-CM

## 2015-03-05 DIAGNOSIS — Z1159 Encounter for screening for other viral diseases: Secondary | ICD-10-CM

## 2015-03-05 DIAGNOSIS — I10 Essential (primary) hypertension: Secondary | ICD-10-CM

## 2015-03-05 LAB — HEMOGLOBIN A1C: Hgb A1c MFr Bld: 7.6 % — ABNORMAL HIGH (ref 4.6–6.5)

## 2015-03-05 NOTE — Progress Notes (Signed)
HPI:  Diabetes Mellitus: -complications: none knwon -meds: amaryl 4mg  daily, lisinopril 20mg , metformin 1000mg  bid -reports: fasting BS has been over 130s for some time -denies: polyuria, polydipsia, low blood sugars, wounds on feet, vision changes -no regular exercise, reports tries to eat healthy, but not doing great with this -last eye exam: > 1 year ago -last foot exam: done, reports he sees podiatry for this and his toenails  HTN: -meds: lisinpril 20mg  daily -denies: CP, SOB, DOE  HLD/Obesity: -meds: simvastatin 20mg  daily -denies: hx statin intol, leg cramps, cog changes        ROS: See pertinent positives and negatives per HPI.  Past Medical History  Diagnosis Date  . Diabetes mellitus type II, controlled, with no complications ([place])   . OSA (obstructive sleep apnea)   . Hyperlipidemia   . Hypertension   . Shingles     2014  . 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 Angioedema     he is unsure of this or cause  . Nephrolithiasis     1980s    Past Surgical History  Procedure Laterality Date  . Appendectomy    . Laparoscopic gastric banding  08/10/2009  . Finger surgery  06/2012    Family History  Problem Relation Age of Onset  . Diabetes    . Heart disease Mother     Social History   Social History  . Marital Status: Married    Spouse Name: N/A  . Number of Children: N/A  . Years of Education: N/A   Social History Main Topics  . Smoking status: Current Some Day Smoker    Types: Cigars  . Smokeless tobacco: Current User    Types: Chew     Comment: 1 cigar occassionally, not more then 1x per week  . Alcohol Use: No  . Drug Use: No  . Sexual Activity: Not Asked   Other Topics Concern  . 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  glimepiride (AMARYL) 2 MG tablet, Take 2 tablets (4 mg total) by mouth daily with breakfast., Disp: 180 tablet, Rfl: 0 .  glucose blood (ONETOUCH VERIO) test strip, Use as instructed, Disp: 100 each, Rfl: 12 .  lisinopril (PRINIVIL,ZESTRIL) 20 MG tablet, Take 1 tablet (20 mg total) by mouth daily., Disp: 90 tablet, Rfl: 1 .  metFORMIN (GLUCOPHAGE) 1000 MG tablet, TAKE 1 TABLET BY MOUTH TWICE DAILY WITH MEAL, Disp: 180 tablet, Rfl: 0 .  Multiple Vitamin (MULTIVITAMIN WITH MINERALS) TABS tablet, Take 1 tablet by mouth daily., Disp: , Rfl:  .  ONETOUCH DELICA LANCETS FINE MISC, Use as instructed by provider, Disp: 200 each, Rfl: 3 .  simvastatin (ZOCOR) 20 MG tablet, Take 1 tablet (20 mg total) by mouth daily., Disp: 90 tablet, Rfl: 1  EXAM:  Filed Vitals:   03/05/15 0903  BP: 112/78  Pulse: 73  Temp: 97.9 F (36.6 C)    Body mass index is 31.78 kg/(m^2).  GENERAL: vitals reviewed and listed above, alert, oriented, appears well hydrated and in no acute distress  HEENT: atraumatic, conjunttiva clear, no obvious abnormalities on inspection of external nose and ears  NECK: no  obvious masses on inspection  LUNGS: clear to auscultation bilaterally, no wheezes, rales or rhonchi, good air movement  CV: HRRR, no peripheral edema  MS: moves all extremities without noticeable abnormality  PSYCH: pleasant and cooperative, no obvious depression or anxiety  ASSESSMENT AND PLAN:  Discussed the following assessment and plan:  Essential hypertension  Hyperlipidemia  Obesity  Type 2 diabetes mellitus without complication, without long-term current use of insulin (HCC) - Plan: Hemoglobin A1c  Need for hepatitis C screening test - Plan: Hep C Antibody  -recheck hgba1c -stress lifestyle recs given weight gain -fastin glipids next appt -Patient advised to return or notify a doctor immediately if symptoms worsen or persist  or new concerns arise.  Patient Instructions  BEFORE YOU LEAVE: -labs -schedule follow up in 4 months - please come fasting for this visit and we will check your cholesterol then  Please call your eye doctor today to schedule your diabetic eye exam. This should be done yearly. Please have them fax the report to [place].  Please schedule your podiatry exam.  We recommend the following healthy lifestyle measures: - eat a healthy whole foods diet consisting of regular small meals composed of vegetables, fruits, beans, nuts, seeds, healthy meats such as white chicken and fish and whole grains.  - avoid sweets, white starchy foods, fried foods, fast food, processed foods, sodas, red meet and other fattening foods.  - get a least 150-300 minutes of aerobic exercise per week.       Colin Benton R.

## 2015-03-05 NOTE — Progress Notes (Signed)
Pre visit review using our clinic review tool, if applicable. No additional management support is needed unless otherwise documented below in the visit note. 

## 2015-03-05 NOTE — Patient Instructions (Addendum)
BEFORE YOU LEAVE: -labs -schedule follow up in 4 months - please come fasting for this visit and we will check your cholesterol then  Please call your eye doctor today to schedule your diabetic eye exam. This should be done yearly. Please have them fax the report to [place].  Please schedule your podiatry exam.  We recommend the following healthy lifestyle measures: - eat a healthy whole foods diet consisting of regular small meals composed of vegetables, fruits, beans, nuts, seeds, healthy meats such as white chicken and fish and whole grains.  - avoid sweets, white starchy foods, fried foods, fast food, processed foods, sodas, red meet and other fattening foods.  - get a least 150-300 minutes of aerobic exercise per week.

## 2015-03-06 ENCOUNTER — Ambulatory Visit: Payer: 59 | Admitting: Family Medicine

## 2015-03-06 LAB — HEPATITIS C ANTIBODY: HCV Ab: NEGATIVE

## 2015-03-11 NOTE — Addendum Note (Signed)
Addended by: Agnes Lawrence on: 03/11/2015 10:42 AM   Modules accepted: Orders

## 2015-03-11 NOTE — Addendum Note (Signed)
Addended by: Agnes Lawrence on: 03/11/2015 10:41 AM   Modules accepted: Orders

## 2015-03-17 ENCOUNTER — Encounter: Payer: Self-pay | Admitting: Podiatry

## 2015-03-17 ENCOUNTER — Ambulatory Visit (INDEPENDENT_AMBULATORY_CARE_PROVIDER_SITE_OTHER): Payer: 59 | Admitting: Podiatry

## 2015-03-17 VITALS — BP 135/83 | HR 67 | Resp 16

## 2015-03-17 DIAGNOSIS — M779 Enthesopathy, unspecified: Secondary | ICD-10-CM | POA: Diagnosis not present

## 2015-03-17 DIAGNOSIS — L600 Ingrowing nail: Secondary | ICD-10-CM | POA: Diagnosis not present

## 2015-03-18 NOTE — Progress Notes (Signed)
Subjective:     Patient ID: Raymond Vasquez, male   DOB: November 03, 1950, 64 y.o.   MRN: [id number]  HPI patient presents stating I have a painful ingrown toenail my left big toe and it's making it hard for me to wear shoe gear comfortably and I've tried to soak it and trim it without relief   Review of Systems  All other systems reviewed and are negative.      Objective:   Physical Exam  Constitutional: He is oriented to person, place, and time.  Cardiovascular: Intact distal pulses.   Musculoskeletal: Normal range of motion.  Neurological: He is oriented to person, place, and time.  Skin: Skin is warm.  Nursing note and vitals reviewed.  neurovascular status found to be intact with muscle strength adequate range of motion within normal limits with patient noted to have incurvated left hallux lateral border that's very painful when pressed and is making it hard to wear shoe gear comfortably. Patient's tried to trim it and soak it without relief and there is distal redness with no indications of drainage     Assessment:     Ingrown toenail deformity left hallux lateral border with structural damage to the underlying nailbed    Plan:     H&P and condition reviewed with patient. I've recommended removal of the offending border and I explained procedure and risk. Patient wants surgery and today I infiltrated 60 mg Xylocaine Marcaine mixture remove the border exposed matrix and applied phenol 3 applications 30 seconds followed by alcohol lavage and sterile dressing. Given instructions on soaks and reappoint

## 2015-03-20 ENCOUNTER — Telehealth: Payer: Self-pay | Admitting: *Deleted

## 2015-03-20 NOTE — Telephone Encounter (Signed)
Called patient at [phone] (Cell #) to check to see how they were doing from their ingrown toenail procedure that was performed on Tuesday, March 17, 2015. Pt stated, "Toe is doing good and in no pain". Pt took Aleve with some relief.

## 2015-03-30 ENCOUNTER — Encounter: Payer: 59 | Attending: Family Medicine | Admitting: Dietician

## 2015-03-30 ENCOUNTER — Encounter: Payer: Self-pay | Admitting: Dietician

## 2015-03-30 VITALS — Ht 70.0 in | Wt 214.0 lb

## 2015-03-30 DIAGNOSIS — E119 Type 2 diabetes mellitus without complications: Secondary | ICD-10-CM | POA: Diagnosis present

## 2015-03-30 DIAGNOSIS — Z713 Dietary counseling and surveillance: Secondary | ICD-10-CM | POA: Diagnosis not present

## 2015-03-30 NOTE — Patient Instructions (Addendum)
Rethink what you drink.  12 ounces of regular soda has about 1/4 cup sugar.  Consider drinking coffee without sugar or with stevia. Increase your non starchy vegetables. Baked rather than fry. Whole grains rather than white. Aim for 2 Carb Choices per meal (30 grams) +/- 1 either way  Aim for 0-1 Carbs per snack if hungry  Include protein in moderation with your meals and snacks Consider reading food labels for Total Carbohydrate and Fat Grams of foods Consider  increasing your activity level by walking for 30 minutes daily as tolerated.  Focus on your days off. Add pedometer app to your phone.  Aim for 10,000 steps per day. Consider checking BG at alternate times per day as directed by MD  Consider taking medication  as directed by MD Breakfast daily.   Consider going to the Dentist.

## 2015-03-30 NOTE — Progress Notes (Signed)
Diabetes Self-Management Education  Visit Type: First/Initial  Appt. Start Time: 1415 Appt. End Time: 7619  03/30/2015  [name], identified by name and date of birth, is a 64 y.o. male with a diagnosis of Diabetes: Type 2.   Patient is here alone.  He has had type 2 diabetes for about the last 12 years.  He lives with his wife and he does the shopping and cooking.  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  He works 12 hour days 3 days on and 2 days off.  He states that he does not have time to exercise but that a new park was opened 4 houses down form his.  He stopped smoking about 1 1/2 years ago.  He only sleeps 5-6 hours per night.  GFR 104.  Mother and 53 of her siblings had diabetes and several of them lost limbs.  He is very fearful of this.  He does not go to the Dentist as he does not like to.   Weight Hx:  Hx of Lap Band surgery in 2006.  Highest weight at 265-270 lbs and currently stays stable at 214-220 lbs.  He states that he works hard to maintain it but is unable to lose under 200 lbs.  [facility] and reports that they are not healthy.  ASSESSMENT  Height 5\' 10"  (1.778 m), weight 214 lb (97.07 kg). Body mass index is 30.71 kg/(m^2).      Diabetes Self-Management Education - 03/30/15 1415    Visit Information   Visit Type First/Initial   Initial Visit   Diabetes Type Type 2   Are you currently following a meal plan? No   Are you taking your medications as prescribed? Yes   Date Diagnosed about 12 years ago (?2005)   Health Coping   How would you rate your overall health? Good   Psychosocial Assessment   Patient Belief/Attitude about Diabetes Motivated to manage diabetes   Self-care barriers None   Self-management support Doctor's office;Family   Other persons present Patient   Patient Concerns Nutrition/Meal planning   Special Needs None   Preferred Learning Style No preference indicated   Learning Readiness Ready   How often do you need to have someone help you when you read instructions, pamphlets, or other written materials from your doctor or pharmacy? 1 - Never   What is the last grade level you completed in school? HS   Complications   Last HgB A1C per patient/outside source 7.6 %  03/05/15   How often do you check your blood sugar? 1-2 times/day   Fasting Blood glucose range (mg/dL) 130-179   Number of hypoglycemic episodes per month 0   Number of hyperglycemic episodes per week 0   Have you had a dilated eye exam in the past 12 months? Yes   Have you had a dental exam in the past 12 months? No   Are you checking your feet? Yes   How many days per week are you checking your feet? 7   Dietary Intake   Breakfast skips on work days OR special K or Rice Krispies and milk, 2 cups coffee with carnation milk, 2 tsp sugar OR eggs, crackers  wakes at 4:30-5:00   Snack (morning) granola bar   Lunch work cafeteria:  fried chicken wings, french fries OR tuna or Roast Beef sandwhich on [employer] bread  12-1   Snack (afternoon) none  Dinner Occupational hygienist Bisque soup and ritz crackers OR red beans and white rice OR chicken or steak with salad and rice and beans OR chilli and crackers  7:30-8:00   Snack (evening) none usually   Beverage(s) coffee with canned milk and 2 tsp sugar, water, rare regular soda, occasional OJ or other juice, crystal light   Exercise   Exercise Type Light (walking / raking leaves)   How many days per week to you exercise? 3   How many minutes per day do you exercise? 60   Total minutes per week of exercise 180   Patient Education   Previous Diabetes Education Yes (please comment)  multiple visits for DM and lap band surgery in 2006   Disease state  Definition of diabetes, type 1 and 2, and the diagnosis of diabetes   Nutrition management  Role of diet in the treatment of diabetes and the relationship between the three main macronutrients and blood  glucose level;Food label reading, portion sizes and measuring food.;Meal options for control of blood glucose level and chronic complications.   Physical activity and exercise  Role of exercise on diabetes management, blood pressure control and cardiac health.   Monitoring Identified appropriate SMBG and/or A1C goals.;Daily foot exams;Yearly dilated eye exam   Psychosocial adjustment Worked with patient to identify barriers to care and solutions   Individualized Goals (developed by patient)   Nutrition General guidelines for healthy choices and portions discussed   Physical Activity Exercise 5-7 days per week;45 minutes per day;30 minutes per day   Monitoring  test my blood glucose as discussed   Reducing Risk examine blood glucose patterns;do foot checks daily   Outcomes   Expected Outcomes Demonstrated interest in learning. Expect positive outcomes   Future DMSE PRN   Program Status Not Completed      Individualized Plan for Diabetes Self-Management Training:   Learning Objective:  Patient will have a greater understanding of diabetes self-management. Patient education plan is to attend individual and/or group sessions per assessed needs and concerns.   Plan:   Patient Instructions  Rethink what you drink.  12 ounces of regular soda has about 1/4 cup sugar.  Consider drinking coffee without sugar or with stevia. Increase your non starchy vegetables. Baked rather than fry. Whole grains rather than white. Aim for 2 Carb Choices per meal (30 grams) +/- 1 either way  Aim for 0-1 Carbs per snack if hungry  Include protein in moderation with your meals and snacks Consider reading food labels for Total Carbohydrate and Fat Grams of foods Consider  increasing your activity level by walking for 30 minutes daily as tolerated.  Focus on your days off. Add pedometer app to your phone.  Aim for 10,000 steps per day. Consider checking BG at alternate times per day as directed by MD  Consider  taking medication  as directed by MD Breakfast daily.   Consider going to the Dentist.    Expected Outcomes:  Demonstrated interest in learning. Expect positive outcomes  Education material provided: Living Well with Diabetes, Food label handouts, A1C conversion sheet, Meal plan card, My Plate and Snack sheet, breakfast  If problems or questions, patient to contact team via:  Phone and Email  Future DSME appointment: PRN

## 2015-06-04 ENCOUNTER — Other Ambulatory Visit (INDEPENDENT_AMBULATORY_CARE_PROVIDER_SITE_OTHER): Payer: 59

## 2015-06-04 DIAGNOSIS — E119 Type 2 diabetes mellitus without complications: Secondary | ICD-10-CM

## 2015-06-04 LAB — HEMOGLOBIN A1C: Hgb A1c MFr Bld: 7.2 % — ABNORMAL HIGH (ref 4.6–6.5)

## 2015-06-08 ENCOUNTER — Other Ambulatory Visit: Payer: Self-pay | Admitting: Family Medicine

## 2015-06-12 ENCOUNTER — Encounter: Payer: Self-pay | Admitting: Family Medicine

## 2015-06-12 ENCOUNTER — Ambulatory Visit (INDEPENDENT_AMBULATORY_CARE_PROVIDER_SITE_OTHER): Payer: 59 | Admitting: Family Medicine

## 2015-06-12 VITALS — BP 112/70 | HR 83 | Temp 98.3°F | Ht 70.0 in | Wt 218.2 lb

## 2015-06-12 DIAGNOSIS — E785 Hyperlipidemia, unspecified: Secondary | ICD-10-CM | POA: Diagnosis not present

## 2015-06-12 DIAGNOSIS — E669 Obesity, unspecified: Secondary | ICD-10-CM | POA: Diagnosis not present

## 2015-06-12 DIAGNOSIS — I10 Essential (primary) hypertension: Secondary | ICD-10-CM | POA: Diagnosis not present

## 2015-06-12 DIAGNOSIS — E119 Type 2 diabetes mellitus without complications: Secondary | ICD-10-CM | POA: Diagnosis not present

## 2015-06-12 MED ORDER — SIMVASTATIN 20 MG PO TABS: 20.0000 mg | ORAL_TABLET | Freq: Every day | ORAL | Status: DC

## 2015-06-12 MED ORDER — GLIMEPIRIDE 2 MG PO TABS: 4.0000 mg | ORAL_TABLET | Freq: Every day | ORAL | Status: DC

## 2015-06-12 MED ORDER — LISINOPRIL 20 MG PO TABS: 20.0000 mg | ORAL_TABLET | Freq: Every day | ORAL | Status: DC

## 2015-06-12 MED ORDER — METFORMIN HCL 1000 MG PO TABS: ORAL_TABLET | ORAL | Status: DC

## 2015-06-12 NOTE — Progress Notes (Signed)
HPI:  Diabetes Mellitus: -complications: none knwon -meds: amaryl 4mg  daily, lisinopril 20mg , metformin 1000mg  bid - hgba1c improved some with more careful use of his medication -reports: fasting BS has been over 130s for some time -denies: polyuria, polydipsia, low blood sugars, wounds on feet, vision changes -2000 steps per day, no aerobic exercise - lots of sugar in beverages and lots of simple carbs -last eye exam: > 1 year ago -last foot exam: today  HTN: -meds: lisinpril 20mg  daily -denies: CP, SOB, DOE  HLD/Obesity: -meds: simvastatin 20mg  daily -denies: hx statin intol, leg cramps, cog changes     ROS: See pertinent positives and negatives per HPI.  Past Medical History  Diagnosis Date  . Diabetes mellitus type II, controlled, with no complications ([place])   . OSA (obstructive sleep apnea)   . Hyperlipidemia   . Hypertension   . Shingles     2014  . 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 Angioedema     he is unsure of this or cause  . Nephrolithiasis     1980s    Past Surgical History  Procedure Laterality Date  . Appendectomy    . Laparoscopic gastric banding  08/10/2009  . Finger surgery  06/2012    Family History  Problem Relation Age of Onset  . Diabetes    . Heart disease Mother     Social History   Social History  . Marital Status: Married    Spouse Name: N/A  . Number of Children: N/A  . Years of Education: N/A   Social History Main Topics  . Smoking status: Current Some Day Smoker    Types: Cigars  . Smokeless tobacco: Current User    Types: Chew     Comment: 1 cigar occassionally, not more then 1x per week  . Alcohol Use: No  . Drug Use: No  . Sexual Activity: Not Asked   Other Topics Concern  . 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  glimepiride (AMARYL) 2 MG tablet, Take 2 tablets (4 mg total) by mouth daily with breakfast., Disp: 180 tablet, Rfl: 3 .  glucose blood (ONETOUCH VERIO) test strip, Use as instructed, Disp: 100 each, Rfl: 12 .  lisinopril (PRINIVIL,ZESTRIL) 20 MG tablet, Take 1 tablet (20 mg total) by mouth daily., Disp: 90 tablet, Rfl: 3 .  metFORMIN (GLUCOPHAGE) 1000 MG tablet, TAKE 1 TABLET BY MOUTH TWICE DAILY WITH MEAL, Disp: 180 tablet, Rfl: 3 .  Multiple Vitamin (MULTIVITAMIN WITH MINERALS) TABS tablet, Take 1 tablet by mouth daily., Disp: , Rfl:  .  ONETOUCH DELICA LANCETS FINE MISC, Use as instructed by provider, Disp: 200 each, Rfl: 3 .  simvastatin (ZOCOR) 20 MG tablet, Take 1 tablet (20 mg total) by mouth daily., Disp: 90 tablet, Rfl: 3  EXAM:  Filed Vitals:   06/12/15 0857  BP: 112/70  Pulse: 83  Temp: 98.3 F (36.8 C)    Body mass index is 31.31 kg/(m^2).  GENERAL: vitals reviewed and listed above, alert, oriented, appears well hydrated and in no acute distress  HEENT: atraumatic, conjunttiva clear, no obvious abnormalities on inspection of external nose and ears  NECK: no obvious masses on inspection  LUNGS: clear to auscultation bilaterally, no wheezes, rales or rhonchi, good air movement  CV: HRRR, no peripheral edema  MS: moves all extremities without noticeable abnormality  PSYCH: pleasant and cooperative, no obvious depression or anxiety  FOOT exam done  ASSESSMENT AND PLAN:  Discussed the following assessment and plan:  Type 2 diabetes mellitus without complication, without long-term current use of insulin ([place]) -prefers to work on lifestyle instead of adding med -set goals to increase steps, eliminate added sugar in coffee -eye exam advised -cont current medications  Hyperlipidemia Obesity Essential hypertension -lifestyle recs, cont current meds  -Patient  advised to return or notify a doctor immediately if symptoms worsen or persist or new concerns arise.  Patient Instructions  BEFORE YOU LEAVE: -update smoking history and give quit line info if any smoking -schedule follow up appointment in 3-4 months  GOALS for next 3 months: -5000 steps per day -no sugar in coffee --> replace with stevia - NuStevia is a good brand -no carnation in coffee --> replace with half and half  Call eye doctor to schedule diabetic eye exam - send report to out office please     Raymond Vasquez R.

## 2015-06-12 NOTE — Patient Instructions (Addendum)
BEFORE YOU LEAVE: -update smoking history and give quit line info if any smoking -schedule follow up appointment in 3-4 months  GOALS for next 3 months: -5000 steps per day -no sugar in coffee --> replace with stevia - NuStevia is a good brand -no carnation in coffee --> replace with half and half  Call eye doctor to schedule diabetic eye exam - send report to out office please

## 2015-06-12 NOTE — Progress Notes (Signed)
Pre visit review using our clinic review tool, if applicable. No additional management support is needed unless otherwise documented below in the visit note. 

## 2015-07-06 ENCOUNTER — Ambulatory Visit: Payer: 59 | Admitting: Family Medicine

## 2015-08-13 ENCOUNTER — Other Ambulatory Visit: Payer: Self-pay | Admitting: General Practice

## 2015-08-13 MED ORDER — GLUCOSE BLOOD VI STRP: ORAL_STRIP | Status: DC

## 2015-09-01 ENCOUNTER — Telehealth: Payer: Self-pay | Admitting: *Deleted

## 2015-09-01 DIAGNOSIS — E785 Hyperlipidemia, unspecified: Secondary | ICD-10-CM

## 2015-09-01 DIAGNOSIS — I10 Essential (primary) hypertension: Secondary | ICD-10-CM

## 2015-09-01 DIAGNOSIS — E119 Type 2 diabetes mellitus without complications: Secondary | ICD-10-CM

## 2015-09-01 NOTE — Telephone Encounter (Signed)
Dr Maudie Mercury received a form from [facility] that the Raymond Vasquez dropped off to be completed and stated the Raymond Vasquez needs labs prior to completing the form.  I scheduled a lab visit for the Raymond Vasquez for 4/21 and the Raymond Vasquez is aware we will keep the form until his office visit the following week.

## 2015-09-10 ENCOUNTER — Other Ambulatory Visit (INDEPENDENT_AMBULATORY_CARE_PROVIDER_SITE_OTHER): Payer: 59

## 2015-09-10 DIAGNOSIS — I10 Essential (primary) hypertension: Secondary | ICD-10-CM

## 2015-09-10 DIAGNOSIS — E785 Hyperlipidemia, unspecified: Secondary | ICD-10-CM | POA: Diagnosis not present

## 2015-09-10 DIAGNOSIS — E119 Type 2 diabetes mellitus without complications: Secondary | ICD-10-CM | POA: Diagnosis not present

## 2015-09-10 LAB — BASIC METABOLIC PANEL
BUN: 13 mg/dL (ref 6–23)
CALCIUM: 9.9 mg/dL (ref 8.4–10.5)
CO2: 29 meq/L (ref 19–32)
Chloride: 104 mEq/L (ref 96–112)
Creatinine, Ser: 0.9 mg/dL (ref 0.40–1.50)
GFR: 90.74 mL/min (ref >60.00)
GLUCOSE: 180 mg/dL — AB (ref 70–99)
Potassium: 4.8 mEq/L (ref 3.5–5.1)
SODIUM: 142 meq/L (ref 135–145)

## 2015-09-10 LAB — LIPID PANEL
CHOL/HDL RATIO: 4
Cholesterol: 153 mg/dL (ref 0–200)
HDL: 38.5 mg/dL — AB (ref >39.00)
LDL Cholesterol: 84 mg/dL (ref 0–99)
NONHDL: 114.38
Triglycerides: 152 mg/dL — ABNORMAL HIGH (ref 0.0–149.0)
VLDL: 30.4 mg/dL (ref 0.0–40.0)

## 2015-09-10 LAB — HEMOGLOBIN A1C: Hgb A1c MFr Bld: 7.6 % — ABNORMAL HIGH (ref 4.6–6.5)

## 2015-09-14 ENCOUNTER — Encounter: Payer: Self-pay | Admitting: Family Medicine

## 2015-09-14 ENCOUNTER — Ambulatory Visit (INDEPENDENT_AMBULATORY_CARE_PROVIDER_SITE_OTHER): Payer: 59 | Admitting: Family Medicine

## 2015-09-14 VITALS — BP 128/86 | HR 88 | Temp 98.6°F | Ht 70.0 in | Wt 216.2 lb

## 2015-09-14 DIAGNOSIS — E669 Obesity, unspecified: Secondary | ICD-10-CM | POA: Diagnosis not present

## 2015-09-14 DIAGNOSIS — E785 Hyperlipidemia, unspecified: Secondary | ICD-10-CM

## 2015-09-14 DIAGNOSIS — I10 Essential (primary) hypertension: Secondary | ICD-10-CM | POA: Diagnosis not present

## 2015-09-14 DIAGNOSIS — E119 Type 2 diabetes mellitus without complications: Secondary | ICD-10-CM

## 2015-09-14 DIAGNOSIS — Z7689 Persons encountering health services in other specified circumstances: Secondary | ICD-10-CM

## 2015-09-14 NOTE — Progress Notes (Signed)
Pre visit review using our clinic review tool, if applicable. No additional management support is needed unless otherwise documented below in the visit note. 

## 2015-09-14 NOTE — Progress Notes (Signed)
HPI:  Diabetes Mellitus: -complications: none known -uncontrolled recent check with hgba1c 7.6 09/10/15 -meds: amaryl 4mg  daily, lisinopril 20mg , metformin 1000mg  bid  -reports: No regular exercise -denies: polyuria, polydipsia, low blood sugars, wounds on feet, vision changes -diet: 1-2 cups coffee with Carnation sugar for breakfast, eats lunch at work and admits usually is poor, for dinner has hot dogs and eggs, canned soup and breaded fish, gross B sandwich on white bread from [place] [employer] -last eye exam: > 1 year ago -last foot exam: done  HTN: -meds: lisinpril 20mg  daily -denies: CP, SOB, DOE  HLD/Obesity: -meds: simvastatin 20mg  daily -denies: hx statin intol, leg cramps, cog changes  ROS: See pertinent positives and negatives per HPI.  Past Medical History  Diagnosis Date  . Diabetes mellitus type II, controlled, with no complications ([place])   . OSA (obstructive sleep apnea)   . Hyperlipidemia   . Hypertension   . Shingles     2014  . 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 Angioedema     he is unsure of this or cause  . Nephrolithiasis     1980s    Past Surgical History  Procedure Laterality Date  . Appendectomy    . Laparoscopic gastric banding  08/10/2009  . Finger surgery  06/2012    Family History  Problem Relation Age of Onset  . Diabetes    . Heart disease Mother     Social History   Social History  . Marital Status: Married    Spouse Name: N/A  . Number of Children: N/A  . Years of Education: N/A   Social History Main Topics  . Smoking status: Current Some Day Smoker    Types: Cigars  . Smokeless tobacco: Current User    Types: Chew     Comment: 1 cigar occassionally, not more then 1x per week  . Alcohol Use: No  . Drug Use: No  . Sexual Activity: Not Asked   Other Topics Concern  . 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  glimepiride (AMARYL) 2 MG tablet, Take 2 tablets (4 mg total) by mouth daily with breakfast., Disp: 180 tablet, Rfl: 3 .  glucose blood (ONETOUCH VERIO) test strip, Use as instructed, Disp: 100 each, Rfl: 12 .  lisinopril (PRINIVIL,ZESTRIL) 20 MG tablet, Take 1 tablet (20 mg total) by mouth daily., Disp: 90 tablet, Rfl: 3 .  metFORMIN (GLUCOPHAGE) 1000 MG tablet, TAKE 1 TABLET BY MOUTH TWICE DAILY WITH MEAL, Disp: 180 tablet, Rfl: 3 .  Multiple Vitamin (MULTIVITAMIN WITH MINERALS) TABS tablet, Take 1 tablet by mouth daily., Disp: , Rfl:  .  ONETOUCH DELICA LANCETS FINE MISC, Use as instructed by provider, Disp: 200 each, Rfl: 3 .  simvastatin (ZOCOR) 20 MG tablet, Take 1 tablet (20 mg total) by mouth daily., Disp: 90 tablet, Rfl: 3  EXAM:  Filed Vitals:   09/14/15 0902  BP: 128/86  Pulse: 88  Temp: 98.6 F (37 C)    Body mass index is 31.02 kg/(m^2).  GENERAL: vitals reviewed and listed above, alert, oriented, appears well hydrated and in no acute distress  HEENT: atraumatic, conjunttiva clear, no  obvious abnormalities on inspection of external nose and ears  NECK: no obvious masses on inspection  LUNGS: clear to auscultation bilaterally, no wheezes, rales or rhonchi, good air movement  CV: HRRR, no peripheral edema  MS: moves all extremities without noticeable abnormality  PSYCH: pleasant and cooperative, no obvious depression or anxiety  ASSESSMENT AND PLAN:  Discussed the following assessment and plan:  Type 2 diabetes mellitus without complication, without long-term current use of insulin ([facility])  Essential hypertension  Hyperlipidemia  Obesity  -diet and exercise counseling, I really think he can get hgba1c down with some changes -discussed adding medication -advised to schedule eye  eam -Patient advised to return or notify a doctor immediately if symptoms worsen or persist or new concerns arise.  Patient Instructions  Before you leave: -Schedule follow-up in 3 months  Please plan to work on the diet and exercise and close follow-up to recheck your diabetes labs as planned.  In particular, work on eating more frozen or fresh vegetables. Work to eat 3 healthy light meals per day. Please try to decrease the sugar and Carnation in the coffee.  We recommend the following healthy lifestyle measures: - eat a healthy whole foods diet consisting of regular small meals composed of vegetables, fruits, beans, nuts, seeds, healthy meats such as white chicken and fish and whole grains.  - avoid sweets, white starchy foods, fried foods, fast food, processed foods, sodas, red meet and other fattening foods.  - get a least 150-300 minutes of aerobic exercise per week.         Colin Benton R.

## 2015-09-14 NOTE — Patient Instructions (Addendum)
Before you leave: -Schedule follow-up in 3 months  Please call your eye doctor today to schedule your diabetic eye exam.  Please plan to work on the diet and exercise and close follow-up to recheck your diabetes labs as planned.  In particular, work on eating more frozen or fresh vegetables. Work to eat 3 healthy light meals per day. Please try to decrease the sugar and Carnation in the coffee.  Continue current medications.  We recommend the following healthy lifestyle measures: - eat a healthy whole foods diet consisting of regular small meals composed of vegetables, fruits, beans, nuts, seeds, healthy meats such as white chicken and fish and whole grains.  - avoid sweets, white starchy foods, fried foods, fast food, processed foods, sodas, red meet and other fattening foods.  - get a least 150-300 minutes of aerobic exercise per week.

## 2016-01-21 ENCOUNTER — Ambulatory Visit (INDEPENDENT_AMBULATORY_CARE_PROVIDER_SITE_OTHER): Payer: 59 | Admitting: Family Medicine

## 2016-01-21 ENCOUNTER — Other Ambulatory Visit: Payer: Self-pay | Admitting: Family Medicine

## 2016-01-21 ENCOUNTER — Encounter: Payer: Self-pay | Admitting: Family Medicine

## 2016-01-21 VITALS — BP 116/80 | HR 82 | Temp 97.8°F | Ht 70.0 in | Wt 216.0 lb

## 2016-01-21 DIAGNOSIS — E785 Hyperlipidemia, unspecified: Secondary | ICD-10-CM | POA: Diagnosis not present

## 2016-01-21 DIAGNOSIS — Z6830 Body mass index (BMI) 30.0-30.9, adult: Secondary | ICD-10-CM

## 2016-01-21 DIAGNOSIS — I10 Essential (primary) hypertension: Secondary | ICD-10-CM | POA: Diagnosis not present

## 2016-01-21 DIAGNOSIS — E119 Type 2 diabetes mellitus without complications: Secondary | ICD-10-CM | POA: Diagnosis not present

## 2016-01-21 DIAGNOSIS — Z9884 Bariatric surgery status: Secondary | ICD-10-CM

## 2016-01-21 LAB — BASIC METABOLIC PANEL
BUN: 14 mg/dL (ref 6–23)
CALCIUM: 9.4 mg/dL (ref 8.4–10.5)
CO2: 30 mEq/L (ref 19–32)
Chloride: 103 mEq/L (ref 96–112)
Creatinine, Ser: 0.78 mg/dL (ref 0.40–1.50)
GFR: 106.9 mL/min (ref >60.00)
GLUCOSE: 194 mg/dL — AB (ref 70–99)
Potassium: 4.4 mEq/L (ref 3.5–5.1)
Sodium: 139 mEq/L (ref 135–145)

## 2016-01-21 LAB — HEMOGLOBIN A1C: Hgb A1c MFr Bld: 7.8 % — ABNORMAL HIGH (ref 4.6–6.5)

## 2016-01-21 MED ORDER — SITAGLIPTIN PHOSPHATE 100 MG PO TABS: 100.0000 mg | ORAL_TABLET | Freq: Every day | ORAL | 1 refills | Status: DC

## 2016-01-21 NOTE — Patient Instructions (Signed)
BEFORE YOU LEAVE: -follow up: 3 months -labs  Check with your insurance on options for diabetes - insulin and other oral medicationss. After labs we will decide on adding insulin or another oral medication.  We recommend the following healthy lifestyle for LIFE: 1) Small portions.   Tip: eat off of a salad plate instead of a dinner plate.  Tip: It is ok to feel hungry after a meal - that likely means you ate an appropriate portion.  Tip: if you need more or a snack choose fruits, veggies and/or a handful of nuts or seeds.  2) Eat a healthy clean diet.  * Tip: Avoid (less then 1 serving per week): processed foods, sweets, sweetened  drinks, white starches (rice, flour, bread, potatoes, pasta, etc), red meat, fast  foods, butter  *Tip: CHOOSE instead   * 5-9 servings per day of fresh or frozen fruits and vegetables (but not corn, potatoes, bananas, canned or dried fruit)   *nuts and seeds, beans   *olives and olive oil   *small portions of lean meats such as fish and white chicken    *small portions of whole grains  3)Get at least 150 minutes of sweaty aerobic exercise per week.  4)Reduce stress - consider counseling, meditation and relaxation to balance other aspects of your life.  We have ordered labs or studies at this visit. It can take up to 1-2 weeks for results and processing. IF results require follow up or explanation, we will call you with instructions. Clinically stable results will be released to your [facility]. If you have not heard from [place] or cannot find your results in [facility] in 2 weeks please contact our office at [phone].  If you are not yet signed up for [facility], please consider signing up.

## 2016-01-21 NOTE — Progress Notes (Signed)
Pre visit review using our clinic review tool, if applicable. No additional management support is needed unless otherwise documented below in the visit note. 

## 2016-01-21 NOTE — Progress Notes (Signed)
HPI:  Follow up:  Diabetes Mellitus: -complications: none known -uncontrolled recent check with hgba1c 7.6 09/10/15 and he opted to work on lifestyle -meds: amaryl 4mg  daily, lisinopril 20mg , metformin 1000mg  bid  -reports: No regular exercise; reports has improved diet but still drink carnation in coffee and does not eat many veggies -does not eat breakfast and reports most calories consumed at dinner -reports BS in the 140-200 range -denies: polyuria, polydipsia, low blood sugars, wounds on feet, vision changes  HTN: -meds: lisinpril 20mg  daily -denies: CP, SOB, DOE  HLD/Obesity: -meds: simvastatin 20mg  daily -denies: hx statin intol, leg cramps, cog changes  ROS: See pertinent positives and negatives per HPI.  Past Medical History:  Diagnosis Date  . Angioedema    he is unsure of this or cause  . 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 Diabetes mellitus type II, controlled, with no complications ([place])   . 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 Hyperlipidemia   . Hypertension   . Nephrolithiasis    1980s  . OSA (obstructive sleep apnea)   . Shingles    2014    Past Surgical History:  Procedure Laterality Date  . APPENDECTOMY    . FINGER SURGERY  06/2012  . LAPAROSCOPIC GASTRIC BANDING  08/10/2009    Family History  Problem Relation Age of Onset  . Diabetes    . Heart disease Mother     Social History   Social History  . Marital status: Married    Spouse name: N/A  . Number of children: N/A  . Years of education: N/A   Social History Main Topics  . Smoking status: Current Some Day Smoker    Types: Cigars  . Smokeless tobacco: Current User    Types: Chew     Comment: 1 cigar occassionally, not more then 1x per week  . Alcohol use No  . Drug use: No  . Sexual activity: Not Asked   Other Topics Concern  . 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  glimepiride (AMARYL) 2 MG tablet, Take 2 tablets (4 mg total) by mouth daily with breakfast., Disp: 180 tablet, Rfl: 3 .  glucose blood (ONETOUCH VERIO) test strip, Use as instructed, Disp: 100 each, Rfl: 12 .  lisinopril (PRINIVIL,ZESTRIL) 20 MG tablet, Take 1 tablet (20 mg total) by mouth daily., Disp: 90 tablet, Rfl: 3 .  metFORMIN (GLUCOPHAGE) 1000 MG tablet, TAKE 1 TABLET BY MOUTH TWICE DAILY WITH MEAL, Disp: 180 tablet, Rfl: 3 .  Multiple Vitamin (MULTIVITAMIN WITH MINERALS) TABS tablet, Take 1 tablet by mouth daily., Disp: , Rfl:  .  ONETOUCH DELICA LANCETS FINE MISC, Use as instructed by provider, Disp: 200 each, Rfl: 3 .  simvastatin (ZOCOR) 20 MG tablet, Take 1 tablet (20 mg total) by mouth daily., Disp: 90 tablet, Rfl: 3  EXAM:  Vitals:   01/21/16 1055  BP: 116/80  Pulse: 82  Temp: 97.8 F (36.6 C)    Body mass index is 30.99 kg/m.  GENERAL: vitals reviewed and listed above, alert, oriented, appears well hydrated and in no acute distress  HEENT: atraumatic, conjunttiva clear, no obvious abnormalities on inspection of external nose and ears  NECK:  no obvious masses on inspection  LUNGS: clear to auscultation bilaterally, no wheezes, rales or rhonchi, good air movement  CV: HRRR, no peripheral edema  MS: moves all extremities without noticeable abnormality  PSYCH: pleasant and cooperative, no obvious depression or anxiety  ASSESSMENT AND PLAN:  Discussed the following assessment and plan:  Type 2 diabetes mellitus without complication, without long-term current use of insulin ([place]) - Plan: Hemoglobin A1c  Essential hypertension - Plan: Basic metabolic panel  Hyperlipidemia  BMI 30.0-30.9,adult  History of laparoscopic adjustable gastric banding, 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  Patient Instructions  BEFORE YOU LEAVE: -follow up: 3 months -labs  Check with your insurance on options for diabetes - insulin and other oral medicationss. After labs we will decide on adding insulin or another oral medication.  We recommend the following healthy lifestyle for LIFE: 1) Small portions.   Tip: eat off of a salad plate instead of a dinner plate.  Tip: It is ok to feel hungry after a meal - that likely means you ate an appropriate portion.  Tip: if you need more or a snack choose fruits, veggies and/or a handful of nuts or seeds.  2) Eat a healthy clean diet.  * Tip: Avoid (less then 1 serving per week): processed foods, sweets, sweetened  drinks, white starches (rice, flour, bread, potatoes, pasta, etc), red meat, fast  foods, butter  *Tip: CHOOSE instead   * 5-9 servings per day of fresh or frozen fruits and vegetables (but not corn, potatoes, bananas, canned or dried fruit)   *nuts and seeds, beans   *olives and olive oil   *small portions of lean meats such as fish and white chicken    *small portions of whole grains  3)Get at least 150 minutes of sweaty aerobic exercise per week.  4)Reduce stress - consider counseling, meditation and relaxation to balance other aspects of your life.  We have ordered labs or studies at this visit. It can take up to 1-2 weeks for results and processing. IF results require follow up or explanation, we will call you with instructions. Clinically stable results will be released to your [facility]. If you have not heard from [place] or  cannot find your results in [facility] in 2 weeks please contact our office at [phone].  If you are not yet signed up for [facility], please consider signing up.            Colin Benton R., DO

## 2016-01-22 LAB — HM DIABETES EYE EXAM

## 2016-06-11 ENCOUNTER — Other Ambulatory Visit: Payer: Self-pay | Admitting: Family Medicine

## 2016-06-13 ENCOUNTER — Other Ambulatory Visit: Payer: Self-pay | Admitting: Family Medicine

## 2016-07-03 ENCOUNTER — Other Ambulatory Visit: Payer: Self-pay | Admitting: Family Medicine

## 2016-07-04 ENCOUNTER — Other Ambulatory Visit: Payer: Self-pay | Admitting: Family Medicine

## 2016-08-04 ENCOUNTER — Encounter: Payer: Self-pay | Admitting: Family Medicine

## 2016-08-04 ENCOUNTER — Ambulatory Visit (INDEPENDENT_AMBULATORY_CARE_PROVIDER_SITE_OTHER): Payer: 59 | Admitting: Family Medicine

## 2016-08-04 VITALS — BP 118/80 | HR 47 | Temp 97.8°F | Ht 70.0 in | Wt 215.2 lb

## 2016-08-04 DIAGNOSIS — K602 Anal fissure, unspecified: Secondary | ICD-10-CM

## 2016-08-04 DIAGNOSIS — K625 Hemorrhage of anus and rectum: Secondary | ICD-10-CM | POA: Diagnosis not present

## 2016-08-04 DIAGNOSIS — Z6830 Body mass index (BMI) 30.0-30.9, adult: Secondary | ICD-10-CM | POA: Insufficient documentation

## 2016-08-04 DIAGNOSIS — K5900 Constipation, unspecified: Secondary | ICD-10-CM | POA: Diagnosis not present

## 2016-08-04 DIAGNOSIS — E119 Type 2 diabetes mellitus without complications: Secondary | ICD-10-CM

## 2016-08-04 DIAGNOSIS — I10 Essential (primary) hypertension: Secondary | ICD-10-CM | POA: Diagnosis not present

## 2016-08-04 DIAGNOSIS — M545 Low back pain, unspecified: Secondary | ICD-10-CM

## 2016-08-04 DIAGNOSIS — E785 Hyperlipidemia, unspecified: Secondary | ICD-10-CM | POA: Diagnosis not present

## 2016-08-04 DIAGNOSIS — K649 Unspecified hemorrhoids: Secondary | ICD-10-CM

## 2016-08-04 LAB — CBC
HCT: 42.9 % (ref 39.0–52.0)
HEMOGLOBIN: 14.1 g/dL (ref 13.0–17.0)
MCHC: 32.8 g/dL (ref 30.0–36.0)
MCV: 84.7 fl (ref 78.0–100.0)
Platelets: 278 10*3/uL (ref 150.0–400.0)
RBC: 5.07 Mil/uL (ref 4.22–5.81)
RDW: 14.5 % (ref 11.5–15.5)
WBC: 5.4 10*3/uL (ref 4.0–10.5)

## 2016-08-04 LAB — POCT URINALYSIS DIPSTICK
BILIRUBIN UA: NEGATIVE
GLUCOSE UA: NEGATIVE
KETONES UA: NEGATIVE
Leukocytes, UA: NEGATIVE
Nitrite, UA: NEGATIVE
Protein, UA: NEGATIVE
RBC UA: NEGATIVE
SPEC GRAV UA: 1.02
Urobilinogen, UA: 0.2
pH, UA: 6

## 2016-08-04 LAB — LIPID PANEL
CHOL/HDL RATIO: 4
CHOLESTEROL: 147 mg/dL (ref 0–200)
HDL: 36 mg/dL — ABNORMAL LOW (ref >39.00)
LDL Cholesterol: 84 mg/dL (ref 0–99)
NonHDL: 110.66
Triglycerides: 134 mg/dL (ref 0.0–149.0)
VLDL: 26.8 mg/dL (ref 0.0–40.0)

## 2016-08-04 LAB — BASIC METABOLIC PANEL
BUN: 17 mg/dL (ref 6–23)
CALCIUM: 10.1 mg/dL (ref 8.4–10.5)
CO2: 32 mEq/L (ref 19–32)
CREATININE: 0.87 mg/dL (ref 0.40–1.50)
Chloride: 103 mEq/L (ref 96–112)
GFR: 94.08 mL/min (ref >60.00)
GLUCOSE: 161 mg/dL — AB (ref 70–99)
Potassium: 4.5 mEq/L (ref 3.5–5.1)
Sodium: 142 mEq/L (ref 135–145)

## 2016-08-04 LAB — HEMOGLOBIN A1C: HEMOGLOBIN A1C: 7.9 % — AB (ref 4.6–6.5)

## 2016-08-04 MED ORDER — HYDROCORTISONE 2.5 % RE CREA: 1.0000 "application " | TOPICAL_CREAM | Freq: Two times a day (BID) | RECTAL | 0 refills | Status: DC

## 2016-08-04 MED ORDER — NITROGLYCERIN 2 % TD OINT: TOPICAL_OINTMENT | TRANSDERMAL | 0 refills | Status: DC

## 2016-08-04 NOTE — Progress Notes (Signed)
Pre visit review using our clinic review tool, if applicable. No additional management support is needed unless otherwise documented below in the visit note. 

## 2016-08-04 NOTE — Progress Notes (Signed)
HPI:  Acute visit for rectal discomfort and BRBPR: -started a few days ago -has been constipated for 1 week, occ back pain -had severe pain in rectum with hard BM last week and sreak of blood on TP -has had some discomfort and intermittent streaks of blood on TP w/ BMs since -no blood in toilet, large amount of bleeding, diarrhea, abd pain -reports treated for prostatitis 2 months ago with several weeks of abx -no dysuria, fevers, voiding symptoms -came fasting and wants labs for chronic disease today  ROS: See pertinent positives and negatives per HPI.  Past Medical History:  Diagnosis Date  . Angioedema    he is unsure of this or cause  . 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 Diabetes mellitus type II, controlled, with no complications ([place])   . 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 Hyperlipidemia   . Hypertension   . Nephrolithiasis    1980s  . OSA (obstructive sleep apnea)   . Shingles    2014    Past Surgical History:  Procedure Laterality Date  . APPENDECTOMY    . FINGER SURGERY  06/2012  . LAPAROSCOPIC GASTRIC BANDING  08/10/2009    Family History  Problem Relation Age of Onset  . Diabetes    . Heart disease Mother     Social History   Social History  . Marital status: Married    Spouse name: N/A  . Number of children: N/A  . Years of education: N/A   Social History Main Topics  . Smoking status: Current Some Day Smoker    Types: Cigars  . Smokeless tobacco: Current User    Types: Chew     Comment: 1 cigar occassionally, not more then 1x per week  . Alcohol use No  . Drug use: No  . Sexual activity: Not Asked   Other Topics Concern  . 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  glimepiride (AMARYL) 2 MG  tablet, TAKE 2 TABLETS(4 MG) BY MOUTH DAILY WITH BREAKFAST, Disp: 180 tablet, Rfl: 0 .  glucose blood (ONETOUCH VERIO) test strip, Use as instructed, Disp: 100 each, Rfl: 12 .  lisinopril (PRINIVIL,ZESTRIL) 20 MG tablet, TAKE 1 TABLET(20 MG) BY MOUTH DAILY, Disp: 90 tablet, Rfl: 0 .  metFORMIN (GLUCOPHAGE) 1000 MG tablet, TAKE 1 TABLET BY MOUTH TWICE DAILY WITH MEAL, Disp: 180 tablet, Rfl: 0 .  Multiple Vitamin (MULTIVITAMIN WITH MINERALS) TABS tablet, Take 1 tablet by mouth daily., Disp: , Rfl:  .  ONETOUCH DELICA LANCETS FINE MISC, Use as instructed by provider, Disp: 200 each, Rfl: 3 .  simvastatin (ZOCOR) 20 MG tablet, TAKE 1 TABLET(20 MG) BY MOUTH DAILY, Disp: 90 tablet, Rfl: 0 .  hydrocortisone (ANUSOL-HC) 2.5 % rectal cream, Place 1 application rectally 2 (two) times daily., Disp: 30 g, Rfl: 0 .  nitroGLYCERIN (NITROGLYN) 2 % ointment, Apply small amount twice daily for 8 weeks to external area of concern., Disp: 30 g, Rfl: 0  EXAM:  Vitals:   08/04/16 0732  BP: 118/80  Pulse: (!) 47  Temp: 97.8 F (36.6 C)    Body mass index is 30.88 kg/m.  GENERAL: vitals reviewed and listed above, alert, oriented,  appears well hydrated and in no acute distress  HEENT: atraumatic, conjunttiva clear, no obvious abnormalities on inspection of external nose and ears  NECK: no obvious masses on inspection  LUNGS: clear to auscultation bilaterally, no wheezes, rales or rhonchi, good air movement  CV: HRRR, no peripheral edema  DRE: small fissure 12 oclock, internal hemorrhoids likely on DRE, hemoccult +  ABD: BS+, soft, NTTP  MS: moves all extremities without noticeable abnormality  PSYCH: pleasant and cooperative, no obvious depression or anxiety  ASSESSMENT AND PLAN:  Discussed the following assessment and plan:  BRBPR (bright red blood per rectum)  Acute low back pain without sciatica, unspecified back pain laterality - Plan: POC Urinalysis Dipstick  Constipation, unspecified  constipation type  Hemorrhoids, unspecified hemorrhoid type  Anal fissure  BMI 30.0-30.9,adult  Type 2 diabetes mellitus without complication, without long-term current use of insulin (H[facility]) - Plan: Hemoglobin A1c  Essential hypertension - Plan: Basic metabolic panel, CBC  Hyperlipidemia, unspecified hyperlipidemia type - Plan: Lipid panel  -suspect hemorrhoids and tear 2ndary to constipation - will tx with bowel regimen, topical nitro and hemorrhoid cream -close follow up  -will get labs for chronic disease w/ follow up when returns in 2 weeks -Patient advised to return or notify a doctor immediately if symptoms worsen or persist or new concerns arise.  Patient Instructions  BEFORE YOU LEAVE: -follow up: 2 weeks for this issue and your regular follow up -physical exam in 3 months -labs prior to leaving today  Take metameucil every morning in a glass of water prior to breakfast.  Use Mirilax daily in water for 5 days, the, daily for 3 days as needed if any hard stools, straining or constipation.  With gloves apply the proctosol 1-2 times daily internally  Apply the nitroglycerin ointment 2 times daily just to the outside upper area where the fissure is.        Colin Benton R., DO

## 2016-08-04 NOTE — Patient Instructions (Addendum)
BEFORE YOU LEAVE: -follow up: 2 weeks for this issue and your regular follow up -physical exam in 3 months -labs prior to leaving today  Take metameucil every morning in a glass of water prior to breakfast.  Use Mirilax daily in water for 5 days, the, daily for 3 days as needed if any hard stools, straining or constipation.  With gloves apply the proctosol 1-2 times daily internally  Apply the nitroglycerin ointment 2 times daily just to the outside upper area where the fissure is.

## 2016-08-05 ENCOUNTER — Telehealth: Payer: Self-pay | Admitting: Family Medicine

## 2016-08-05 NOTE — Telephone Encounter (Signed)
Pt states his insurance will pay for either:  januvia, and invokana  Which ever one Dr Maudie Mercury thinks is best for pt, that is what he wants to do   [id number] - [place], [place] - [address] [place] [address] [place] [address]

## 2016-08-08 ENCOUNTER — Encounter: Payer: Self-pay | Admitting: Family Medicine

## 2016-08-08 NOTE — Telephone Encounter (Signed)
Ok to send Januvia 100mg  daily. Thanks.#90. 1 refill. Thanks.

## 2016-08-09 MED ORDER — SITAGLIPTIN PHOSPHATE 100 MG PO TABS: 100.0000 mg | ORAL_TABLET | Freq: Every day | ORAL | 1 refills | Status: DC

## 2016-08-09 NOTE — Telephone Encounter (Signed)
Rx done and I called the pt and left a detailed message with this information and a coupon card was left at the front desk for him to pick up.

## 2016-08-15 ENCOUNTER — Other Ambulatory Visit: Payer: Self-pay | Admitting: *Deleted

## 2016-08-15 MED ORDER — SITAGLIPTIN PHOSPHATE 100 MG PO TABS: 100.0000 mg | ORAL_TABLET | Freq: Every day | ORAL | 1 refills | Status: DC

## 2016-08-15 NOTE — Telephone Encounter (Signed)
Rx done. 

## 2016-08-24 NOTE — Progress Notes (Deleted)
HPI:  Raymond Vasquez is a pleasant 66 y.o. here for follow up. Chronic medical problems summarized below were reviewed for changes and stability and were updated as needed below. Diabetes lab still above goal and he recently opted to add Januvia. He reports ***Otherwise, these issues and their treatment remain stable for the most part. Denies CP, SOB, DOE, treatment intolerance or new symptoms. Due for foot exam.  Constipation/Anal fissure: -treated with nitroglycerin and bowel regimen -last colonoscopy 2015 with benign polyp  Diabetes Mellitus: -complications: none known -meds: amaryl 4mg  daily, lisinopril 20mg , metformin 1000mg  bid  -hx poor diet and no regular exercise  HTN: -meds: lisinpril 20mg  daily  HLD/Obesity: -meds: simvastatin 20mg  daily  ROS: See pertinent positives and negatives per HPI.  Past Medical History:  Diagnosis Date  . Angioedema    he is unsure of this or cause  . 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 Diabetes mellitus type II, controlled, with no complications ([place])   . 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 Hyperlipidemia   . Hypertension   . Nephrolithiasis    1980s  . OSA (obstructive sleep apnea)   . Shingles    2014    Past Surgical History:  Procedure Laterality Date  . APPENDECTOMY    . FINGER SURGERY  06/2012  . LAPAROSCOPIC GASTRIC BANDING  08/10/2009    Family History  Problem Relation Age of Onset  . Diabetes    . Heart disease Mother     Social History   Social History  . Marital status: Married    Spouse name: N/A  . Number of children: N/A  . Years of education: N/A   Social History Main Topics  . Smoking status: Current Some Day Smoker    Types: Cigars  . Smokeless tobacco: Current User    Types: Chew     Comment: 1 cigar occassionally, not more then 1x per week  . Alcohol use No  . Drug use: No  . Sexual activity: Not on file   Other Topics Concern  . 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  glimepiride (AMARYL) 2 MG tablet, TAKE 2 TABLETS(4 MG) BY MOUTH DAILY WITH BREAKFAST, Disp: 180 tablet, Rfl: 0 .  glucose blood (ONETOUCH VERIO) test strip, Use as instructed, Disp: 100 each, Rfl: 12 .  hydrocortisone (ANUSOL-HC) 2.5 % rectal cream, Place 1 application rectally 2 (two) times daily., Disp: 30 g, Rfl: 0 .  lisinopril (PRINIVIL,ZESTRIL) 20 MG tablet, TAKE 1 TABLET(20 MG) BY MOUTH DAILY, Disp: 90 tablet, Rfl: 0 .  metFORMIN (GLUCOPHAGE) 1000 MG tablet, TAKE 1 TABLET BY MOUTH TWICE DAILY WITH MEAL, Disp: 180 tablet, Rfl: 0 .  Multiple Vitamin (MULTIVITAMIN WITH MINERALS) TABS tablet, Take 1 tablet by mouth daily., Disp: , Rfl:  .  nitroGLYCERIN (NITROGLYN) 2 % ointment, Apply small amount twice daily for 8 weeks to external area of concern., Disp: 30 g, Rfl: 0 .  ONETOUCH DELICA LANCETS FINE MISC, Use as instructed by provider, Disp: 200 each, Rfl: 3 .  simvastatin (ZOCOR) 20 MG tablet, TAKE 1 TABLET(20 MG) BY MOUTH DAILY, Disp: 90 tablet, Rfl: 0 .  sitaGLIPtin (JANUVIA) 100 MG  tablet, Take 1 tablet (100 mg total) by mouth daily., Disp: 90 tablet, Rfl: 1  EXAM:  There were no vitals filed for this visit.  There is no height or weight on file to calculate BMI.  GENERAL: vitals reviewed and listed above, alert, oriented, appears well hydrated and in no acute distress  HEENT: atraumatic, conjunttiva clear, no obvious abnormalities on inspection of external nose and ears  NECK: no obvious masses on inspection  LUNGS: clear to auscultation bilaterally, no wheezes, rales or rhonchi, good air movement  CV: HRRR, no peripheral edema  MS: moves all extremities without noticeable abnormality  PSYCH: pleasant and cooperative, no obvious depression  or anxiety  ASSESSMENT AND PLAN:  Discussed the following assessment and plan:  No diagnosis found.  -Patient advised to return or notify a doctor immediately if symptoms worsen or persist or new concerns arise.  There are no Patient Instructions on file for this visit.  Colin Benton R., DO

## 2016-08-25 ENCOUNTER — Ambulatory Visit: Payer: 59 | Admitting: Family Medicine

## 2016-08-29 NOTE — Progress Notes (Signed)
HPI:  Follow up several issues:  Constipation/BRBPR: -BRB thought 2ndary to fissure and hemorrhoids form straining -treated with bowel regimen and topical nitro -reports: issues resolved completely, occ hard stool sinc eno on bowel regimen faithfully -denies: pain, bleeding, hematochezia, melena  DM: -uncontrolled on recent check -meds: metformin, amaryl, januvia recently added, acei -in setting of obesity, htn, hld -reports doing well on the Januvia x 1 week -home BS 80-170 -does not eat during day sometimes as food at work not good - could pack lunch -due for foot exam, consider asa if no further bleeding  ROS: See pertinent positives and negatives per HPI.  Past Medical History:  Diagnosis Date  . Angioedema    he is unsure of this or cause  . 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 Diabetes mellitus type II, controlled, with no complications ([place])   . 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 Hyperlipidemia   . Hypertension   . Nephrolithiasis    1980s  . OSA (obstructive sleep apnea)   . Shingles    2014    Past Surgical History:  Procedure Laterality Date  . APPENDECTOMY    . FINGER SURGERY  06/2012  . LAPAROSCOPIC GASTRIC BANDING  08/10/2009    Family History  Problem Relation Age of Onset  . Diabetes    . Heart disease Mother     Social History   Social History  . Marital status: Married    Spouse name: N/A  . Number of children: N/A  . Years of education: N/A   Social History Main Topics  . Smoking status: Current Some Day Smoker    Types: Cigars  . Smokeless tobacco: Current User    Types: Chew     Comment: 1 cigar occassionally, not more then 1x per week  . Alcohol use No  . Drug use: No  . Sexual activity: Not Asked   Other Topics Concern  . 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  glimepiride (AMARYL) 2 MG tablet, TAKE 2 TABLETS(4 MG) BY MOUTH DAILY WITH BREAKFAST, Disp: 180 tablet, Rfl: 0 .  glucose blood (ONETOUCH VERIO) test strip, Use as instructed, Disp: 100 each, Rfl: 12 .  hydrocortisone (ANUSOL-HC) 2.5 % rectal cream, Place 1 application rectally 2 (two) times daily., Disp: 30 g, Rfl: 0 .  lisinopril (PRINIVIL,ZESTRIL) 20 MG tablet, TAKE 1 TABLET(20 MG) BY MOUTH DAILY, Disp: 90 tablet, Rfl: 0 .  metFORMIN (GLUCOPHAGE) 1000 MG tablet, TAKE 1 TABLET BY MOUTH TWICE DAILY WITH MEAL, Disp: 180 tablet, Rfl: 0 .  Multiple Vitamin (MULTIVITAMIN WITH MINERALS) TABS tablet, Take 1 tablet by mouth daily., Disp: , Rfl:  .  nitroGLYCERIN (NITROGLYN) 2 % ointment, Apply small amount twice daily for 8 weeks to external area of concern., Disp: 30 g, Rfl: 0 .  ONETOUCH DELICA LANCETS FINE MISC, Use as instructed by provider, Disp: 200 each, Rfl: 3 .  simvastatin (ZOCOR) 20 MG tablet, TAKE 1 TABLET(20 MG) BY MOUTH DAILY, Disp: 90 tablet, Rfl: 0 .  sitaGLIPtin (JANUVIA) 100 MG tablet, Take 1 tablet (100 mg total) by mouth daily., Disp: 90 tablet, Rfl: 1  EXAM:  Vitals:   08/30/16 7124  BP: 122/88  Pulse: 66  Temp: 98.1 F (36.7 C)    Body mass index is 30.28 kg/m.  GENERAL: vitals reviewed and listed above, alert, oriented, appears well hydrated and in no acute distress  HEENT: atraumatic, conjunttiva clear, no obvious abnormalities on inspection of external nose and ears  NECK: no obvious masses on inspection  LUNGS: clear to auscultation bilaterally, no wheezes, rales or rhonchi, good air movement  CV: HRRR, no peripheral edema  MS: moves all extremities without noticeable abnormality  PSYCH: pleasant and cooperative, no obvious depression or anxiety  ASSESSMENT AND PLAN:  Discussed the following assessment and plan:  BRBPR (bright red blood per rectum)  Constipation,  unspecified constipation type  Type 2 diabetes mellitus without complication, without long-term current use of insulin ([facility])  -glad bowel issues and bleeding resolved - advised daily fiber supplement and mirilax if needed, advised GI eval if further bleeding issues -diet discussed at length and small regular meals important, he will try to pack a lunch - counseled on options -cont current med for now -hypoglycemia and management discussed -foot exam done -follow up 3 months -lifestyle recs for BP and monitor, may need to adjust med if persists -Patient advised to return or notify a doctor immediately if symptoms worsen or persist or new concerns arise.  Patient Instructions  BEFORE YOU LEAVE: -follow up: 3 months  Continue bowel regimen. Call if any further bowel or bleeding issues.  Consider packing healthy lunch - salad or whole grain sandwhich, etc...  FOR YOUR DIABETES:  []  Eat a healthy clean diet with avoidance of (less then 1 serving per week) processed foods, white starches, red meat, fast foods and sweets and consisting of: * 5-9 servings of fresh or frozen fruits and vegetables (not corn or potatoes, not dried or canned) *nuts and seeds, beans *olives and olive oil *small portions of lean meats such as fish and white chicken  *small portions of whole grains  []  Get AT LEAST 150 minutes of cardiovascular exercise per week - 30 minutes per day is best of sustained sweaty exercise.  []  Take all of your medications every day as directed by your doctor. Call your doctor immediately if you have any questions about your medications or are running low.  []  Check your blood sugar often and when you feel unwell and keep a log to bring to all health appointments. FASTING: before you eat anything in the morning POSTPRANDIAL: 1-2 hours after a meal  []  If any low blood sugars < 70, eat a snack and call your doctor immediately.  []  See an eye doctor every year and fax your  diabetic eye exam to our office.  Fax: [phone]  []  Take good care of your feet and keep them soft and callus free. Check your feet daily and wear comfortable shoes. See your doctor immediately if you have any cuts, calluses or wounds on your feet.     Colin Benton R., DO

## 2016-08-30 ENCOUNTER — Encounter: Payer: Self-pay | Admitting: Family Medicine

## 2016-08-30 ENCOUNTER — Ambulatory Visit (INDEPENDENT_AMBULATORY_CARE_PROVIDER_SITE_OTHER): Payer: 59 | Admitting: Family Medicine

## 2016-08-30 VITALS — BP 122/88 | HR 66 | Temp 98.1°F | Ht 70.0 in | Wt 211.0 lb

## 2016-08-30 DIAGNOSIS — E119 Type 2 diabetes mellitus without complications: Secondary | ICD-10-CM

## 2016-08-30 DIAGNOSIS — K625 Hemorrhage of anus and rectum: Secondary | ICD-10-CM

## 2016-08-30 DIAGNOSIS — K5900 Constipation, unspecified: Secondary | ICD-10-CM | POA: Diagnosis not present

## 2016-08-30 NOTE — Patient Instructions (Signed)
BEFORE YOU LEAVE: -follow up: 3 months  Continue bowel regimen. Call if any further bowel or bleeding issues.  Consider packing healthy lunch - salad or whole grain sandwhich, etc...  FOR YOUR DIABETES:  []  Eat a healthy clean diet with avoidance of (less then 1 serving per week) processed foods, white starches, red meat, fast foods and sweets and consisting of: * 5-9 servings of fresh or frozen fruits and vegetables (not corn or potatoes, not dried or canned) *nuts and seeds, beans *olives and olive oil *small portions of lean meats such as fish and white chicken  *small portions of whole grains  []  Get AT LEAST 150 minutes of cardiovascular exercise per week - 30 minutes per day is best of sustained sweaty exercise.  []  Take all of your medications every day as directed by your doctor. Call your doctor immediately if you have any questions about your medications or are running low.  []  Check your blood sugar often and when you feel unwell and keep a log to bring to all health appointments. FASTING: before you eat anything in the morning POSTPRANDIAL: 1-2 hours after a meal  []  If any low blood sugars < 70, eat a snack and call your doctor immediately.  []  See an eye doctor every year and fax your diabetic eye exam to our office.  Fax: [phone]  []  Take good care of your feet and keep them soft and callus free. Check your feet daily and wear comfortable shoes. See your doctor immediately if you have any cuts, calluses or wounds on your feet.

## 2016-08-30 NOTE — Progress Notes (Signed)
Pre visit review using our clinic review tool, if applicable. No additional management support is needed unless otherwise documented below in the visit note. 

## 2016-09-02 ENCOUNTER — Other Ambulatory Visit: Payer: Self-pay | Admitting: Family Medicine

## 2016-09-07 ENCOUNTER — Other Ambulatory Visit: Payer: Self-pay | Admitting: Family Medicine

## 2016-09-15 ENCOUNTER — Other Ambulatory Visit: Payer: Self-pay | Admitting: Family Medicine

## 2016-09-19 ENCOUNTER — Encounter: Payer: Self-pay | Admitting: Family Medicine

## 2016-10-06 ENCOUNTER — Other Ambulatory Visit: Payer: Self-pay | Admitting: Family Medicine

## 2016-12-06 ENCOUNTER — Other Ambulatory Visit: Payer: Self-pay | Admitting: Occupational Medicine

## 2016-12-06 ENCOUNTER — Ambulatory Visit
Admission: RE | Admit: 2016-12-06 | Discharge: 2016-12-06 | Disposition: A | Discharge status: Home / Self Care | Payer: Worker's Compensation | Source: Ambulatory Visit | Attending: Occupational Medicine | Admitting: Occupational Medicine

## 2016-12-06 DIAGNOSIS — W19XXXA Unspecified fall, initial encounter: Secondary | ICD-10-CM

## 2016-12-06 IMAGING — DX DG SHOULDER 2+V*L*
3 series · 3 of 3 positions shown · non-contrast
Comparison: None.

CLINICAL DATA: Fall [DATE], left anterior shoulder pain

EXAM:
LEFT SHOULDER - 2+ VIEW

[dg shoulder left (1 of 3)]
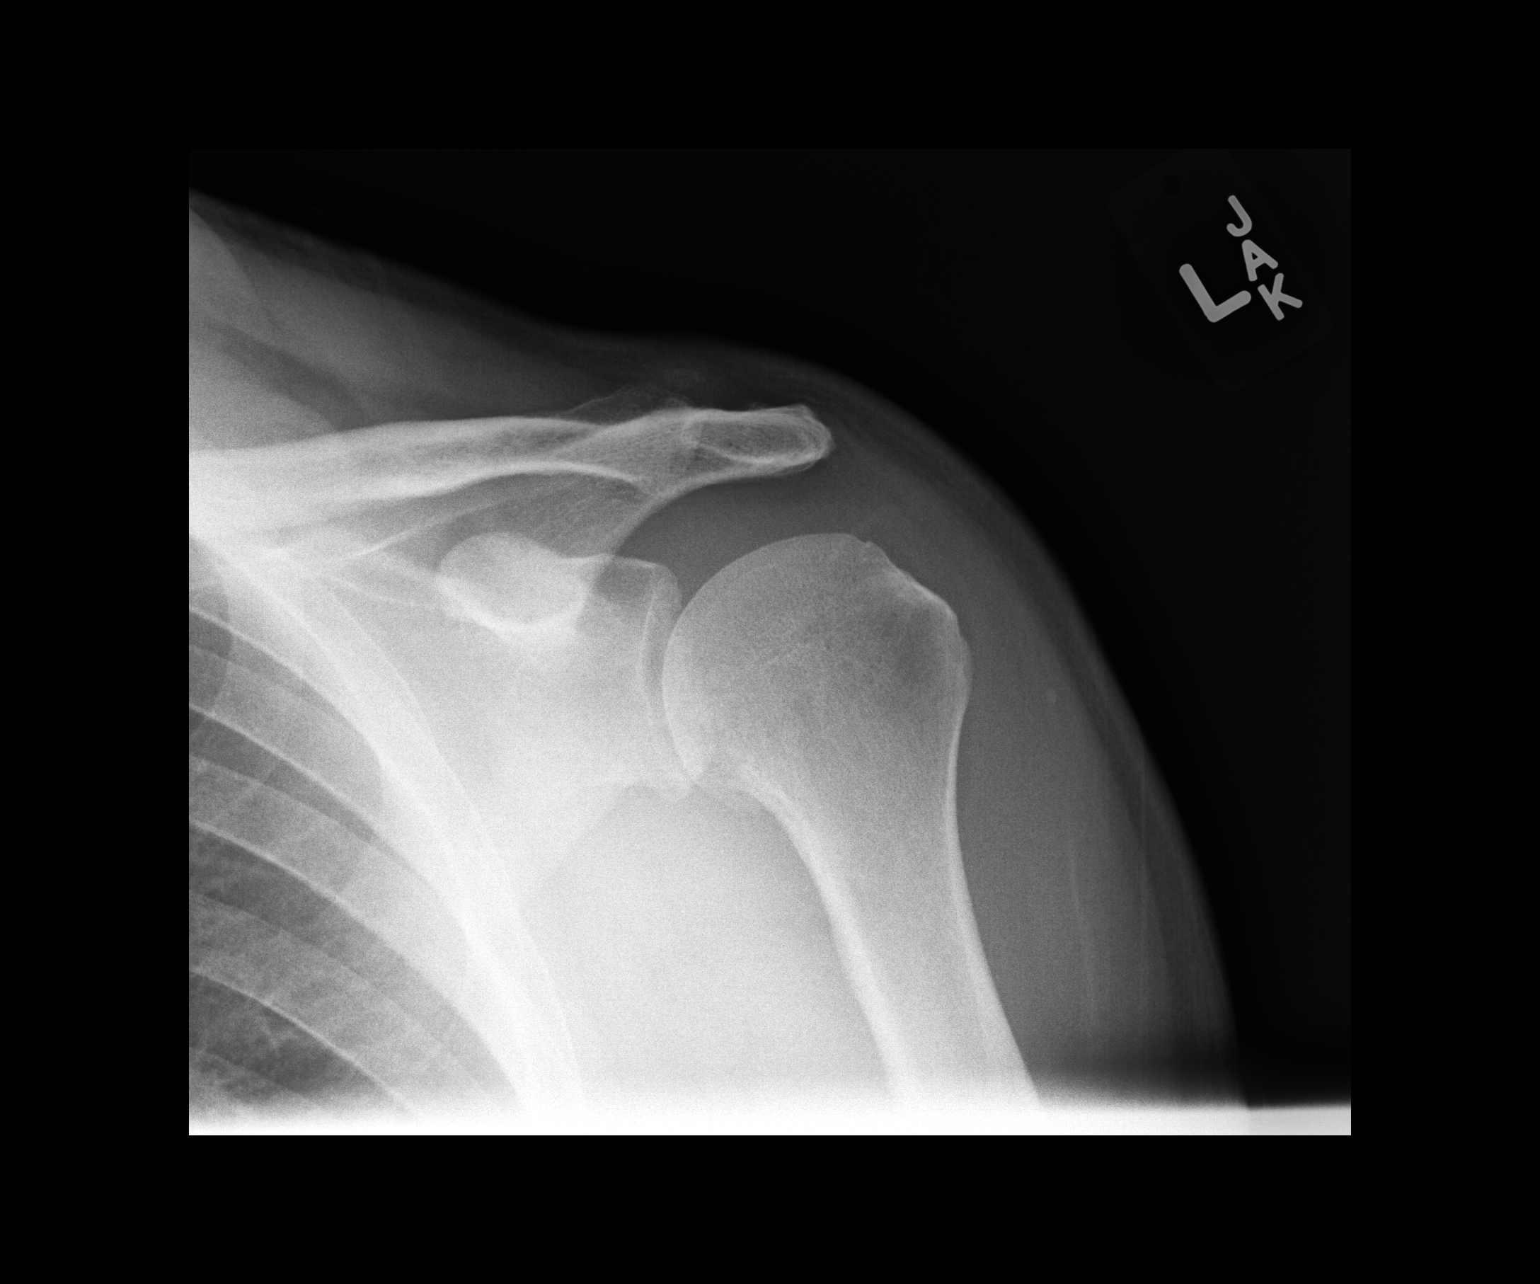

[dg shoulder left (2 of 3)]
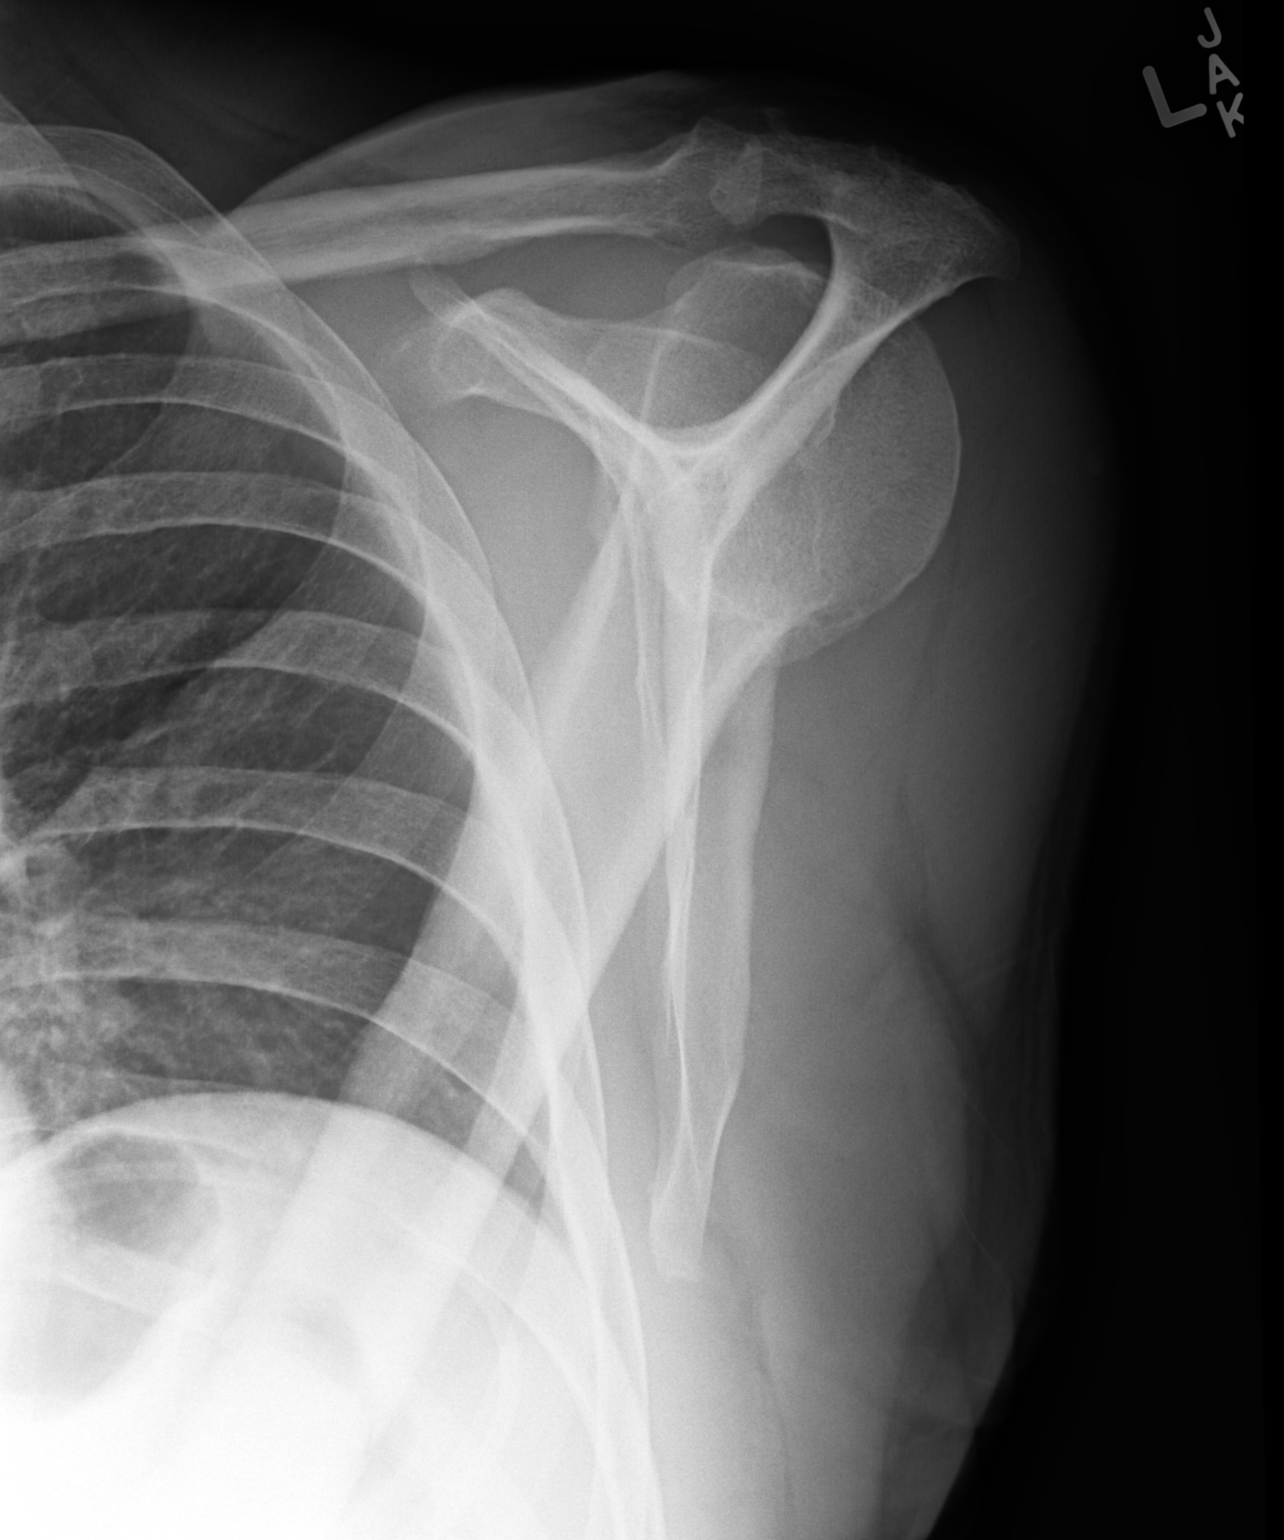

[dg shoulder left (3 of 3)]
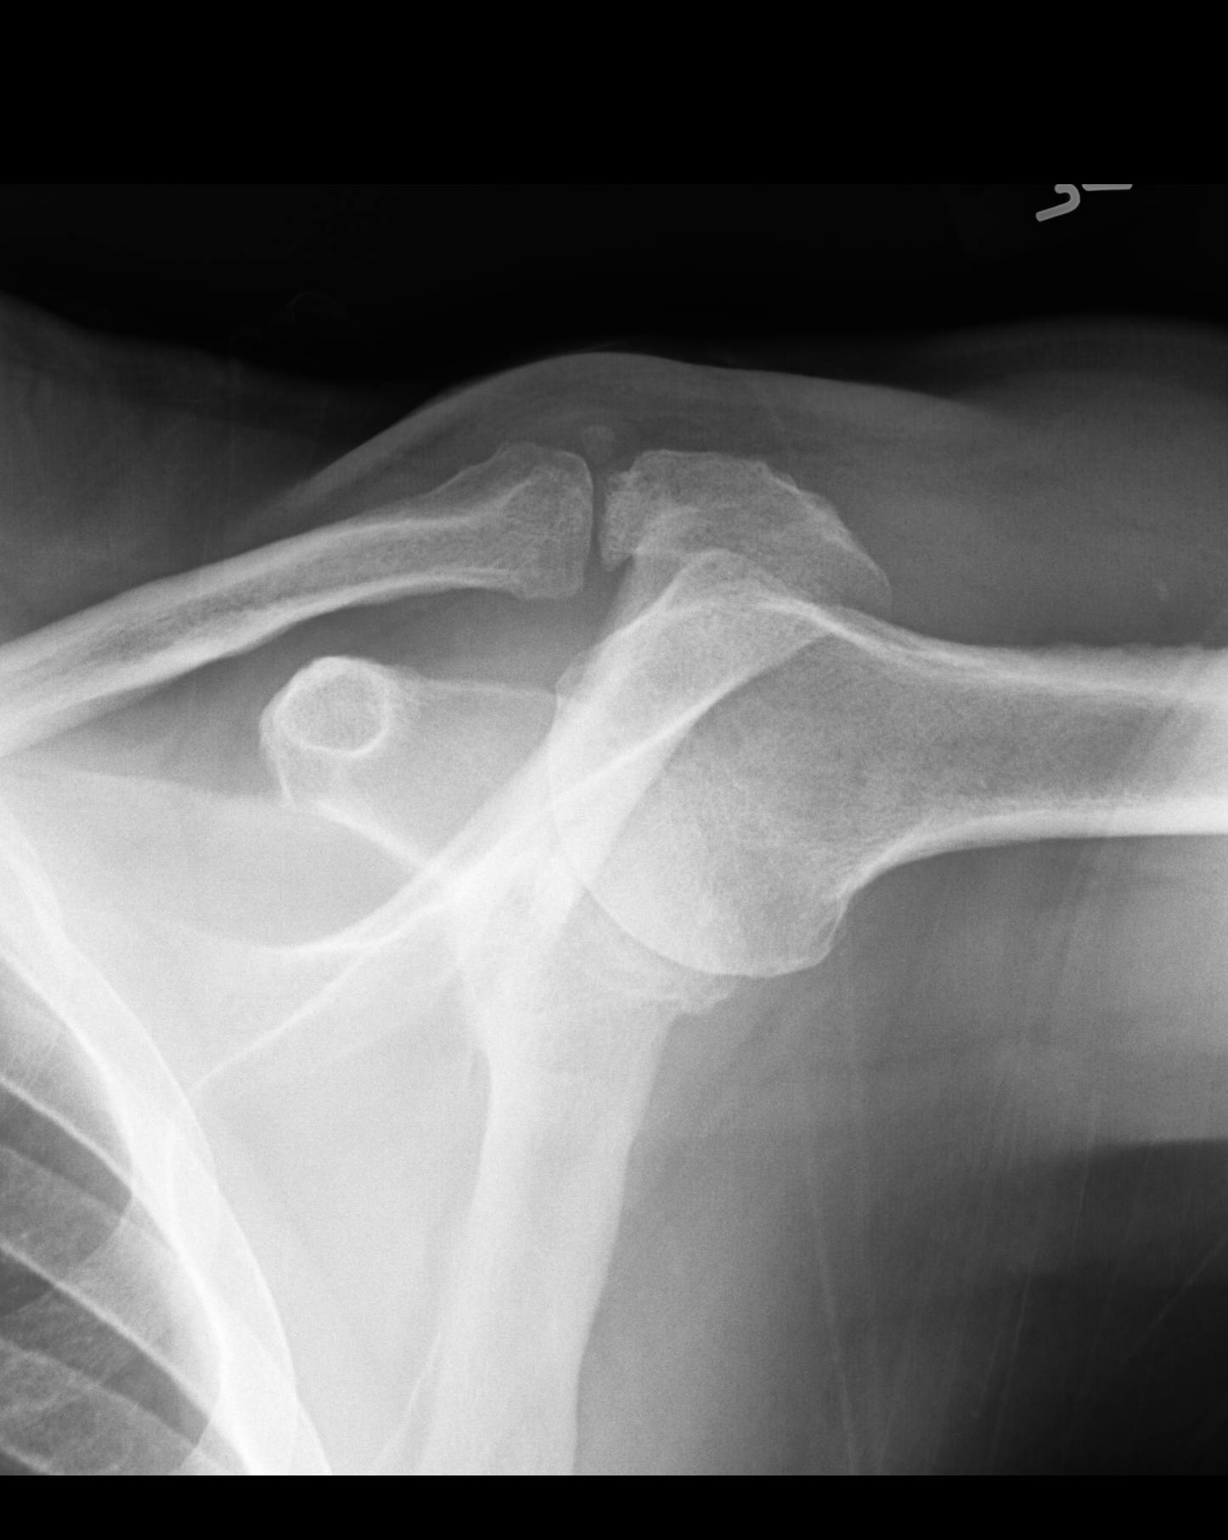

[3 of 3 positions shown; findings below may reference images not displayed]

FINDINGS: There is no fracture or dislocation. There is mild osteoarthritis of
the left glenohumeral joint. There are mild degenerative changes of
the acromioclavicular joint.
IMPRESSION: 1.  No acute osseous injury of the left shoulder.
2. Mild osteoarthritis of the left glenohumeral joint.

## 2016-12-08 NOTE — Progress Notes (Signed)
HPI:  Raymond Vasquez is a pleasant 66 y.o. here for follow up. Chronic medical problems summarized below were reviewed for changes. Reports is doing well with FBS in 1 teens to 120. Trying to eat healthy and gets regular exercise. Request doctor not to be able to shave face every other day instead of daily as is prone to folliculitis of the face with frequent shaving and has sensitive skin. 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 Denies CP, SOB, DOE, treatment intolerance or new symptoms.  DM with HTN and HLD: -meds:  Metformin, amaryl 4mg , januvia, simvastatin, lisinopril -? Asa -due for labs  ROS: See pertinent positives and negatives per HPI.  Past Medical History:  Diagnosis Date  . Angioedema    he is unsure of this or cause  . 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 Diabetes mellitus type II, controlled, with no complications ([place])   . 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 Hyperlipidemia   . Hypertension   . Nephrolithiasis    1980s  . OSA (obstructive sleep apnea)   . Shingles    2014    Past Surgical History:  Procedure Laterality Date  . APPENDECTOMY    . FINGER SURGERY  06/2012  . LAPAROSCOPIC GASTRIC BANDING  08/10/2009    Family History  Problem Relation Age of Onset  . Diabetes Unknown   . Heart disease Mother     Social History   Social History  . Marital status: Married    Spouse name: N/A  . Number of children: N/A  . Years of education: N/A   Social History Main Topics  . Smoking status: Current Some Day Smoker    Types: Cigars  . Smokeless tobacco: Current User    Types: Chew     Comment: 1 cigar occassionally, not more then 1x per week  . Alcohol use No  . Drug use: No  . Sexual activity: Not Asked   Other Topics Concern  . 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  glimepiride (AMARYL) 2 MG tablet, TAKE 2 TABLETS BY MOUTH DAILY WITH BREAKFAST, Disp: 180 tablet, Rfl: 1 .  glucose blood (ONETOUCH VERIO) test strip, To check blood sugar 3 times a day, Disp: 100 each, Rfl: 3 .  hydrocortisone (ANUSOL-HC) 2.5 % rectal cream, Place 1 application rectally 2 (two) times daily., Disp: 30 g, Rfl: 0 .  lisinopril (PRINIVIL,ZESTRIL) 20 MG tablet, TAKE 1 TABLET BY MOUTH DAILY, Disp: 90 tablet, Rfl: 1 .  metFORMIN (GLUCOPHAGE) 1000 MG tablet, TAKE 1 TABLET BY MOUTH TWICE DAILY WITH MEAL, Disp: 180 tablet, Rfl: 1 .  Multiple Vitamin (MULTIVITAMIN WITH MINERALS) TABS tablet, Take 1 tablet by mouth daily., Disp: , Rfl:  .  nitroGLYCERIN (NITROGLYN) 2 % ointment, Apply small amount twice daily for 8 weeks to external area of concern., Disp: 30 g, Rfl: 0 .  ONETOUCH DELICA LANCETS FINE MISC, Use as instructed by provider, Disp: 200 each, Rfl: 3 .  simvastatin (ZOCOR) 20 MG tablet, TAKE 1 TABLET BY MOUTH DAILY, Disp: 90 tablet, Rfl: 1 .  sitaGLIPtin (JANUVIA) 100 MG tablet, Take 1 tablet (  100 mg total) by mouth daily., Disp: 90 tablet, Rfl: 1  EXAM:  Vitals:   12/09/16 0913  BP: 130/82  Pulse: 72  Temp: 98.2 F (36.8 C)    Body mass index is 30.2 kg/m.  GENERAL: vitals reviewed and listed above, alert, oriented, appears well hydrated and in no acute distress  HEENT: atraumatic, conjunttiva clear, no obvious abnormalities on inspection of external nose and ears  NECK: no obvious masses on inspection  LUNGS: clear to auscultation bilaterally, no wheezes, rales or rhonchi, good air movement  CV: HRRR, no peripheral edema  MS: moves all extremities without noticeable abnormality  PSYCH: pleasant and cooperative, no obvious depression or anxiety  ASSESSMENT AND PLAN:  Discussed the following assessment and plan:  Type 2 diabetes mellitus  without complication, without long-term current use of insulin (HCC) - Plan: Hemoglobin A1c  Essential hypertension - Plan: Basic metabolic panel, CBC  Hyperlipidemia, unspecified hyperlipidemia type  BMI 30.0-30.9,adult  -lifestyle recs -labs, cont current medications pending results -note to shave every other day provided -follow up 3-4 months -Patient advised to return or notify a doctor immediately if symptoms worsen or persist or new concerns arise.  Patient Instructions  BEFORE YOU LEAVE: -follow up: 3-4 months -letter for work -lab  We have ordered labs or studies at this visit. It can take up to 1-2 weeks for results and processing. IF results require follow up or explanation, we will call you with instructions. Clinically stable results will be released to your [facility]. If you have not heard from [place] or cannot find your results in [facility] in 2 weeks please contact our office at [phone].  If you are not yet signed up for [facility], please consider signing up.   We recommend the following healthy lifestyle for LIFE: 1) Small portions.   Tip: eat off of a salad plate instead of a dinner plate.  Tip: It is ok to feel hungry after a meal - that likely means you ate an appropriate portion.  Tip: if you need more or a snack choose fruits, veggies and/or a handful of nuts or seeds.  2) Eat a healthy clean diet.   TRY TO EAT: -at least 5-7 servings of low sugar vegetables per day (not corn, potatoes or bananas.) -berries are the best choice if you wish to eat fruit.   -lean meets (fish, chicken or [place] breasts) -vegan proteins for some meals - beans or tofu, whole grains, nuts and seeds -Replace bad fats with good fats - good fats include: fish, nuts and seeds, canola oil, olive oil -small amounts of low fat or non fat dairy -small amounts of100 % whole grains - check the lables  AVOID: -SUGAR, sweets, anything with added sugar, corn syrup or sweeteners -if you must  have a sweetener, small amounts of stevia may be best -sweetened beverages -simple starches (rice, bread, potatoes, pasta, chips, etc - small amounts of 100% whole grains are ok) -red meat, pork, butter -fried foods, fast food, processed food, excessive dairy, eggs and coconut.  3)Get at least 150 minutes of sweaty aerobic exercise per week.  4)Reduce stress - consider counseling, meditation and relaxation to balance other aspects of your life.          Colin Benton R., DO

## 2016-12-09 ENCOUNTER — Ambulatory Visit (INDEPENDENT_AMBULATORY_CARE_PROVIDER_SITE_OTHER): Payer: 59 | Admitting: Family Medicine

## 2016-12-09 ENCOUNTER — Encounter: Payer: Self-pay | Admitting: *Deleted

## 2016-12-09 ENCOUNTER — Encounter: Payer: Self-pay | Admitting: Family Medicine

## 2016-12-09 VITALS — BP 130/82 | HR 72 | Temp 98.2°F | Ht 70.0 in | Wt 210.5 lb

## 2016-12-09 DIAGNOSIS — I10 Essential (primary) hypertension: Secondary | ICD-10-CM | POA: Diagnosis not present

## 2016-12-09 DIAGNOSIS — E785 Hyperlipidemia, unspecified: Secondary | ICD-10-CM

## 2016-12-09 DIAGNOSIS — Z6830 Body mass index (BMI) 30.0-30.9, adult: Secondary | ICD-10-CM | POA: Diagnosis not present

## 2016-12-09 DIAGNOSIS — E119 Type 2 diabetes mellitus without complications: Secondary | ICD-10-CM

## 2016-12-09 LAB — CBC
HEMATOCRIT: 43 % (ref 39.0–52.0)
HEMOGLOBIN: 13.9 g/dL (ref 13.0–17.0)
MCHC: 32.3 g/dL (ref 30.0–36.0)
MCV: 86.4 fl (ref 78.0–100.0)
PLATELETS: 273 10*3/uL (ref 150.0–400.0)
RBC: 4.98 Mil/uL (ref 4.22–5.81)
RDW: 14.6 % (ref 11.5–15.5)
WBC: 4.6 10*3/uL (ref 4.0–10.5)

## 2016-12-09 LAB — BASIC METABOLIC PANEL
BUN: 15 mg/dL (ref 6–23)
CHLORIDE: 106 meq/L (ref 96–112)
CO2: 27 mEq/L (ref 19–32)
CREATININE: 0.84 mg/dL (ref 0.40–1.50)
Calcium: 9.9 mg/dL (ref 8.4–10.5)
GFR: 97.86 mL/min (ref >60.00)
GLUCOSE: 124 mg/dL — AB (ref 70–99)
Potassium: 4.4 mEq/L (ref 3.5–5.1)
Sodium: 140 mEq/L (ref 135–145)

## 2016-12-09 LAB — HEMOGLOBIN A1C: HEMOGLOBIN A1C: 7 % — AB (ref 4.6–6.5)

## 2016-12-09 NOTE — Patient Instructions (Signed)
BEFORE YOU LEAVE: -follow up: 3-4 months -letter for work -lab  We have ordered labs or studies at this visit. It can take up to 1-2 weeks for results and processing. IF results require follow up or explanation, we will call you with instructions. Clinically stable results will be released to your [facility]. If you have not heard from [place] or cannot find your results in [facility] in 2 weeks please contact our office at [phone].  If you are not yet signed up for [facility], please consider signing up.   We recommend the following healthy lifestyle for LIFE: 1) Small portions.   Tip: eat off of a salad plate instead of a dinner plate.  Tip: It is ok to feel hungry after a meal - that likely means you ate an appropriate portion.  Tip: if you need more or a snack choose fruits, veggies and/or a handful of nuts or seeds.  2) Eat a healthy clean diet.   TRY TO EAT: -at least 5-7 servings of low sugar vegetables per day (not corn, potatoes or bananas.) -berries are the best choice if you wish to eat fruit.   -lean meets (fish, chicken or [place] breasts) -vegan proteins for some meals - beans or tofu, whole grains, nuts and seeds -Replace bad fats with good fats - good fats include: fish, nuts and seeds, canola oil, olive oil -small amounts of low fat or non fat dairy -small amounts of100 % whole grains - check the lables  AVOID: -SUGAR, sweets, anything with added sugar, corn syrup or sweeteners -if you must have a sweetener, small amounts of stevia may be best -sweetened beverages -simple starches (rice, bread, potatoes, pasta, chips, etc - small amounts of 100% whole grains are ok) -red meat, pork, butter -fried foods, fast food, processed food, excessive dairy, eggs and coconut.  3)Get at least 150 minutes of sweaty aerobic exercise per week.  4)Reduce stress - consider counseling, meditation and relaxation to balance other aspects of your life.

## 2017-01-11 ENCOUNTER — Ambulatory Visit (INDEPENDENT_AMBULATORY_CARE_PROVIDER_SITE_OTHER): Payer: 59 | Admitting: Family Medicine

## 2017-01-11 ENCOUNTER — Encounter: Payer: Self-pay | Admitting: Family Medicine

## 2017-01-11 ENCOUNTER — Telehealth: Payer: Self-pay | Admitting: Family Medicine

## 2017-01-11 VITALS — BP 102/68 | HR 66 | Temp 97.5°F | Wt 211.0 lb

## 2017-01-11 DIAGNOSIS — J069 Acute upper respiratory infection, unspecified: Secondary | ICD-10-CM | POA: Diagnosis not present

## 2017-01-11 NOTE — Progress Notes (Signed)
Subjective:     Patient ID: Raymond Vasquez, male   DOB: 1951/02/04, 66 y.o.   MRN: [id number]  HPI Patient seen for acute visit with onset yesterday of sore throat, body aches, nasal congestion. Minimal cough. Denies any nausea, vomiting, or diarrhea. No sick contacts. He has chronic problems including type 2 diabetes. This has been fairly well controlled.  Past Medical History:  Diagnosis Date  . Angioedema    he is unsure of this or cause  . 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 Diabetes mellitus type II, controlled, with no complications ([place])   . 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 Hyperlipidemia   . Hypertension   . Nephrolithiasis    1980s  . OSA (obstructive sleep apnea)   . Shingles    2014   Past Surgical History:  Procedure Laterality Date  . APPENDECTOMY    . FINGER SURGERY  06/2012  . LAPAROSCOPIC GASTRIC BANDING  08/10/2009    reports that he has been smoking Cigars.  His smokeless tobacco use includes Chew. He reports that he does not drink alcohol or use drugs. family history includes Diabetes in his unknown relative; Heart disease in his mother. No Known Allergies   Review of Systems  Constitutional: Positive for fatigue.  HENT: Positive for congestion and sore throat.   Respiratory: Negative for shortness of breath and wheezing.   Musculoskeletal: Positive for myalgias.  Neurological: Positive for headaches.       Objective:   Physical Exam  Constitutional: He appears well-developed and well-nourished.  HENT:  Right Ear: External ear normal.  Left Ear: External ear normal.  Mouth/Throat: Oropharynx is clear and moist.  Neck: Neck supple.  Cardiovascular: Normal rate and regular rhythm.   Pulmonary/Chest: Effort normal and breath sounds normal. No respiratory distress. He has no wheezes. He has no rales.  Lymphadenopathy:    He has no cervical adenopathy.       Assessment:     Viral URI    Plan:      -Reassurance -Consider over-the-counter Tylenol and Mucinex -Follow-up for any fever or worsening symptoms -Work note written for today through Saturday  Eulas Post MD [facility]

## 2017-01-11 NOTE — Patient Instructions (Signed)
You very likely have a viral upper respiratory infection Stay well hydrated Consider OTC Tylenol and Mucinex  Follow up for any fever or other concerns

## 2017-01-11 NOTE — Telephone Encounter (Signed)
Noted  

## 2017-01-11 NOTE — Telephone Encounter (Signed)
[facility] - Client [facility]  Patient Name: Raymond Vasquez  DOB: [birth year]    Initial Comment sore throat, runny nose, drainage, body aches, head congestion, headache, watery eyes   Nurse Assessment  Nurse: Harlow Mares, RN, Suanne Marker Date/Time (Eastern Time): 01/11/2017 8:25:22 AM  Confirm and document reason for call. If symptomatic, describe symptoms. ---sore throat, drainage, facial pain (around the cheeks), head congestion, headache, watery eyes. No chest congestion. Drainage back of throat.  Does the patient have any new or worsening symptoms? ---Yes  Will a triage be completed? ---Yes  Related visit to physician within the last 2 weeks? ---No  Does the PT have any chronic conditions? (i.e. diabetes, asthma, etc.) ---Yes  List chronic conditions. ---diabetes type 2;  Is this a behavioral health or substance abuse call? ---No     Guidelines    Guideline Title Affirmed Question Affirmed Notes  Headache [1] MODERATE headache (e.g., interferes with normal activities) AND [2] present > 24 hours AND [3] unexplained (Exceptions: analgesics not tried, typical migraine, or headache part of viral illness)    Final Disposition User   See Physician within 24 Hours [place], RN, Suanne Marker    Comments  Appt scheduled with Dr. Elease Hashimoto at [place] today at the [facility] location.   Referrals  REFERRED TO PCP OFFICE   Disagree/Comply: Comply

## 2017-02-10 ENCOUNTER — Other Ambulatory Visit: Payer: Self-pay | Admitting: Family Medicine

## 2017-02-12 ENCOUNTER — Other Ambulatory Visit: Payer: Self-pay | Admitting: Family Medicine

## 2017-03-10 ENCOUNTER — Other Ambulatory Visit: Payer: Self-pay | Admitting: Family Medicine

## 2017-03-10 ENCOUNTER — Ambulatory Visit: Payer: Self-pay | Admitting: Family Medicine

## 2017-04-06 ENCOUNTER — Other Ambulatory Visit: Payer: Self-pay | Admitting: Family Medicine

## 2017-04-16 ENCOUNTER — Other Ambulatory Visit: Payer: Self-pay | Admitting: Family Medicine

## 2017-05-23 HISTORY — PX: COLONOSCOPY: SHX174

## 2017-05-23 HISTORY — PX: POLYPECTOMY: SHX149

## 2017-06-07 ENCOUNTER — Other Ambulatory Visit: Payer: Self-pay | Admitting: Family Medicine

## 2017-06-11 ENCOUNTER — Other Ambulatory Visit: Payer: Self-pay | Admitting: Family Medicine

## 2017-06-20 ENCOUNTER — Other Ambulatory Visit: Payer: Self-pay | Admitting: Family Medicine

## 2017-07-03 ENCOUNTER — Telehealth: Payer: Self-pay | Admitting: Family Medicine

## 2017-07-03 NOTE — Telephone Encounter (Signed)
I called the pt and he complains of rectal itching and pressure, which is worse with sitting for the past 2-3 weeks.  Patient denies any bleeding, diarrhea, constipation or abdominal pain. Appt scheduled for 2/12.

## 2017-07-03 NOTE — Telephone Encounter (Signed)
Copied from [place]. Topic: Quick Communication - See Telephone Encounter >> Jul 03, 2017 11:49 AM Aurelio Brash B wrote: CRM for notification. See Telephone encounter for:  PT is asking to speak with Dr Noel Journey nurse.  He states he is having an issue with his back side  and he has had it before (only information he would give- he said it was embarrassing) He wants the nurse to call him back   07/03/17.

## 2017-07-04 ENCOUNTER — Ambulatory Visit: Payer: 59 | Admitting: Family Medicine

## 2017-07-04 ENCOUNTER — Encounter: Payer: Self-pay | Admitting: Family Medicine

## 2017-07-04 VITALS — BP 108/70 | HR 76 | Temp 98.3°F | Ht 70.0 in | Wt 212.5 lb

## 2017-07-04 DIAGNOSIS — K649 Unspecified hemorrhoids: Secondary | ICD-10-CM | POA: Diagnosis not present

## 2017-07-04 MED ORDER — HYDROCORTISONE 2.5 % RE CREA: 1.0000 "application " | TOPICAL_CREAM | Freq: Two times a day (BID) | RECTAL | 0 refills | Status: DC

## 2017-07-04 NOTE — Progress Notes (Signed)
HPI:  Acute visit for reported hemorrhoid: -hx hemorrhoids - colonoscopy 2015 with hemorrhoids and benign polyp per report -he reports some symptoms intermittently for 1 month - mild anal irritation -felt hemorrhoid in shower yesterday -denies significant pain, bleeding, malaise, change in bowel diameter -uses bowl regimen and denies recent constipation or straining ROS: See pertinent positives and negatives per HPI.  Past Medical History:  Diagnosis Date  . Angioedema    he is unsure of this or cause  . 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 Diabetes mellitus type II, controlled, with no complications ([place])   . 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 Hyperlipidemia   . Hypertension   . Nephrolithiasis    1980s  . OSA (obstructive sleep apnea)   . Shingles    2014    Past Surgical History:  Procedure Laterality Date  . APPENDECTOMY    . FINGER SURGERY  06/2012  . LAPAROSCOPIC GASTRIC BANDING  08/10/2009    Family History  Problem Relation Age of Onset  . Diabetes Unknown   . Heart disease Mother     Social History   Socioeconomic History  . Marital status: Married    Spouse name: None  . Number of children: None  . Years of education: None  . Highest education level: None  Social Needs  . Financial resource strain: None  . Food insecurity - worry: None  . Food insecurity - inability: None  . Transportation needs - medical: None  . Transportation needs - non-medical: None  Occupational History  . None  Tobacco Use  . Smoking status: Current Some Day Smoker    Types: Cigars  . Smokeless tobacco: Current User    Types: Chew  . Tobacco comment: 1 cigar occassionally, not more then 1x per week  Substance and Sexual Activity  . Alcohol use: No  . Drug use: No  . Sexual activity: None  Other Topics Concern  . 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  glimepiride (AMARYL) 2 MG tablet, TAKE 2 TABLETS BY MOUTH DAILY WITH BREAKFAST, Disp: 180 tablet, Rfl: 1 .  glucose blood (ONETOUCH VERIO) test strip, To check blood sugar 3 times a day, Disp: 100 each, Rfl: 3 .  hydrocortisone (ANUSOL-HC) 2.5 % rectal cream, Place 1 application rectally 2 (two) times daily., Disp: 30 g, Rfl: 0 .  JANUVIA 100 MG tablet, TAKE 1 TABLET BY MOUTH  DAILY, Disp: 90 tablet, Rfl: 1 .  lisinopril (PRINIVIL,ZESTRIL) 20 MG tablet, TAKE 1 TABLET BY MOUTH DAILY, Disp: 90 tablet, Rfl: 1 .  metFORMIN (GLUCOPHAGE) 1000 MG tablet, TAKE 1 TABLET BY MOUTH TWICE DAILY WITH MEAL, Disp: 180 tablet, Rfl: 1 .  Multiple Vitamin (MULTIVITAMIN WITH MINERALS) TABS tablet, Take 1 tablet by mouth daily., Disp: , Rfl:  .  nitroGLYCERIN (NITROGLYN) 2 % ointment, Apply small amount twice daily for 8 weeks to external area of concern., Disp: 30 g, Rfl: 0 .  ONETOUCH DELICA LANCETS FINE MISC, Use as instructed by provider, Disp: 200 each, Rfl: 3 .  simvastatin (ZOCOR) 20 MG tablet, TAKE 1 TABLET BY MOUTH DAILY, Disp: 90 tablet, Rfl: 1  EXAM:  Vitals:   07/04/17 1423  BP: 108/70  Pulse: 76  Temp: 98.3 F (36.8 C)    Body mass index is 30.49 kg/m.  GENERAL: vitals reviewed and listed above, alert, oriented, appears well hydrated and in no acute distress  HEENT: atraumatic, conjunttiva clear, no obvious abnormalities on inspection of external nose and ears  NECK: no obvious masses on inspection  RECT: small ext hemorrhoid, soft, non-thrombosed  MS: moves all extremities without noticeable abnormality  PSYCH: pleasant and cooperative, no obvious depression or anxiety  ASSESSMENT AND PLAN:  Discussed the following assessment and plan:  Hemorrhoids, unspecified hemorrhoid type  -discussed tx options -trial anusol, home tx ---> referral to GI if not  improving  -due for labs and follow up chronic dz - he agrees to schedule  Patient Instructions  BEFORE YOU LEAVE: -follow up: 1 month - we will plan to do labs then   Hemorrhoids Hemorrhoids are swollen veins in and around the rectum or anus. Hemorrhoids can cause pain, itching, or bleeding. Most of the time, they do not cause serious problems. They usually get better with diet changes, lifestyle changes, and other home treatments. Follow these instructions at home: Eating and drinking  Eat foods that have fiber, such as whole grains, beans, nuts, fruits, and vegetables. Ask your doctor about taking products that have added fiber (fibersupplements).  Drink enough fluid to keep your pee (urine) clear or pale yellow. For Pain and Swelling  Take a warm-water bath (sitz bath) for 20 minutes to ease pain. Do this 3-4 times a day.  If directed, put ice on the painful area. It may be helpful to use ice between your warm baths. ? Put ice in a plastic bag. ? Place a towel between your skin and the bag. ? Leave the ice on for 20 minutes, 2-3 times a day. General instructions  Take over-the-counter and prescription medicines only as told by your doctor. ? Medicated creams and medicines that are inserted into the anus (suppositories) may be used or applied as told.  Exercise often.  Go to the bathroom when you have the urge to poop (to have a bowel movement). Do not wait.  Avoid pushing too hard (straining) when you poop.  Keep the butt area dry and clean. Use wet toilet paper or moist paper towels.  Do not sit on the toilet for a long time. Contact a doctor if:  You have any of these: ? Pain and swelling that do not get better with treatment or medicine. ? Bleeding that will not stop. ? Trouble pooping or you cannot poop. ? Pain or swelling outside the area of the hemorrhoids. This information is not intended to replace advice given to you by your health care provider. Make sure  you discuss any questions you have with your health care provider. Document Released: 02/16/2008 Document Revised: 10/15/2015 Document Reviewed: 01/21/2015 Elsevier Interactive Patient Education  2018 [place], DO

## 2017-07-04 NOTE — Patient Instructions (Addendum)
BEFORE YOU LEAVE: -follow up: 1 month - we will plan to do labs then   Hemorrhoids Hemorrhoids are swollen veins in and around the rectum or anus. Hemorrhoids can cause pain, itching, or bleeding. Most of the time, they do not cause serious problems. They usually get better with diet changes, lifestyle changes, and other home treatments. Follow these instructions at home: Eating and drinking  Eat foods that have fiber, such as whole grains, beans, nuts, fruits, and vegetables. Ask your doctor about taking products that have added fiber (fibersupplements).  Drink enough fluid to keep your pee (urine) clear or pale yellow. For Pain and Swelling  Take a warm-water bath (sitz bath) for 20 minutes to ease pain. Do this 3-4 times a day.  If directed, put ice on the painful area. It may be helpful to use ice between your warm baths. ? Put ice in a plastic bag. ? Place a towel between your skin and the bag. ? Leave the ice on for 20 minutes, 2-3 times a day. General instructions  Take over-the-counter and prescription medicines only as told by your doctor. ? Medicated creams and medicines that are inserted into the anus (suppositories) may be used or applied as told.  Exercise often.  Go to the bathroom when you have the urge to poop (to have a bowel movement). Do not wait.  Avoid pushing too hard (straining) when you poop.  Keep the butt area dry and clean. Use wet toilet paper or moist paper towels.  Do not sit on the toilet for a long time. Contact a doctor if:  You have any of these: ? Pain and swelling that do not get better with treatment or medicine. ? Bleeding that will not stop. ? Trouble pooping or you cannot poop. ? Pain or swelling outside the area of the hemorrhoids. This information is not intended to replace advice given to you by your health care provider. Make sure you discuss any questions you have with your health care provider. Document Released: 02/16/2008  Document Revised: 10/15/2015 Document Reviewed: 01/21/2015 Elsevier Interactive Patient Education  [employer].

## 2017-08-09 NOTE — Progress Notes (Signed)
HPI:  Using dictation device. Unfortunately this device frequently misinterprets words/phrases.  Raymond Vasquez is a pleasant 67 y.o. here for follow up. Chronic medical problems summarized below were reviewed for changes.  Reports doing okay for the most part.  Trying to get regular exercise and eat healthier.  He continues to have problems with discomfort in the anal region 1-2 times per month.  He has opted to see gastroenterology for this and wants a referral.  He has a history of hemorrhoids.  Denies any recent bleeding.  Denies CP, SOB, DOE, treatment intolerance or new symptoms. Due for labs, fasting  DM: -meds: metformin, amaryl, januvia  HTN: -meds: lisinopril  HLD: -simvastatin  ROS: See pertinent positives and negatives per HPI.  Past Medical History:  Diagnosis Date  . Angioedema    he is unsure of this or cause  . 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 Diabetes mellitus type II, controlled, with no complications ([place])   . 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 Hyperlipidemia   . Hypertension   . Nephrolithiasis    1980s  . OSA (obstructive sleep apnea)   . Shingles    2014    Past Surgical History:  Procedure Laterality Date  . APPENDECTOMY    . FINGER SURGERY  06/2012  . LAPAROSCOPIC GASTRIC BANDING  08/10/2009    Family History  Problem Relation Age of Onset  . Diabetes Unknown   . Heart disease Mother     SOCIAL HX: No reported changes   Current Outpatient Medications:  .  glimepiride (AMARYL) 2 MG tablet, TAKE 2 TABLETS BY MOUTH DAILY WITH BREAKFAST, Disp: 180 tablet, Rfl: 1 .  glucose blood (ONETOUCH VERIO) test strip, To check blood sugar 3 times a day, Disp: 100 each, Rfl: 3 .  hydrocortisone (ANUSOL-HC) 2.5 % rectal cream, Place 1 application rectally 2 (two) times daily., Disp: 30 g, Rfl: 0 .  JANUVIA 100 MG tablet, TAKE 1 TABLET BY MOUTH  DAILY, Disp: 90 tablet, Rfl: 1 .  lisinopril (PRINIVIL,ZESTRIL) 20 MG tablet,  TAKE 1 TABLET BY MOUTH DAILY, Disp: 90 tablet, Rfl: 1 .  metFORMIN (GLUCOPHAGE) 1000 MG tablet, TAKE 1 TABLET BY MOUTH TWICE DAILY WITH MEAL, Disp: 180 tablet, Rfl: 1 .  Multiple Vitamin (MULTIVITAMIN WITH MINERALS) TABS tablet, Take 1 tablet by mouth daily., Disp: , Rfl:  .  nitroGLYCERIN (NITROGLYN) 2 % ointment, Apply small amount twice daily for 8 weeks to external area of concern., Disp: 30 g, Rfl: 0 .  ONETOUCH DELICA LANCETS FINE MISC, Use as instructed by provider, Disp: 200 each, Rfl: 3 .  simvastatin (ZOCOR) 20 MG tablet, TAKE 1 TABLET BY MOUTH DAILY, Disp: 90 tablet, Rfl: 1  EXAM:  Vitals:   08/10/17 0905  BP: 90/60  Pulse: 77  Temp: 97.9 F (36.6 C)    Body mass index is 30.73 kg/m.  GENERAL: vitals reviewed and listed above, alert, oriented, appears well hydrated and in no acute distress  HEENT: atraumatic, conjunttiva clear, no obvious abnormalities on inspection of external nose and ears  NECK: no obvious masses on inspection  LUNGS: clear to auscultation bilaterally, no wheezes, rales or rhonchi, good air movement  CV: HRRR, no peripheral edema  MS: moves all extremities without noticeable abnormality  PSYCH: pleasant and cooperative, no obvious depression or anxiety  ASSESSMENT AND PLAN:  Discussed the following assessment and plan:  Hemorrhoids, unspecified hemorrhoid type  Anal or rectal pain - Plan: Ambulatory referral to Gastroenterology  Type  2 diabetes mellitus without complication, without long-term current use of insulin ([place]) - Plan: Hemoglobin A1c  Hypertension associated with diabetes ([place]) - Plan: Basic metabolic panel, CBC  Hyperlipidemia associated with type 2 diabetes mellitus ([place]) - Plan: Lipid panel  -Lifestyle recommendations -Referral to gastroenterology -we have tried bowel regimen and hemorrhoid creams, advised GI referral in the past, he now has agreed to see them  -Labs today, fasting -Physical in 3-4 months  Patient  Instructions  BEFORE YOU LEAVE: -Labs -follow up: Physical in 3-4 months  -We placed a referral for you as discussed to the gastroenterologist. It usually takes about 1-2 weeks to process and schedule this referral. If you have not heard from [place] regarding this appointment in 2 weeks please contact our office.  We have ordered labs or studies at this visit. It can take up to 1-2 weeks for results and processing. IF results require follow up or explanation, we will call you with instructions. Clinically stable results will be released to your [facility]. If you have not heard from [place] or cannot find your results in [facility] in 2 weeks please contact our office at [phone].  If you are not yet signed up for [facility], please consider signing up.   We recommend the following healthy lifestyle for LIFE: 1) Small portions. But, make sure to get regular (at least 3 per day), healthy meals and small healthy snacks if needed.  2) Eat a healthy clean diet.   TRY TO EAT: -at least 5-7 servings of low sugar, colorful, and nutrient rich vegetables per day (not corn, potatoes or bananas.) -berries are the best choice if you wish to eat fruit (only eat small amounts if trying to reduce weight)  -lean meets (fish, white meat of chicken or [place]) -vegan proteins for some meals - beans or tofu, whole grains, nuts and seeds -Replace bad fats with good fats - good fats include: fish, nuts and seeds, canola oil, olive oil -small amounts of low fat or non fat dairy -small amounts of100 % whole grains - check the lables -drink plenty of water  AVOID: -SUGAR, sweets, anything with added sugar, corn syrup or sweeteners - must read labels as even foods advertised as "healthy" often are loaded with sugar -if you must have a sweetener, small amounts of stevia may be best -sweetened beverages and artificially sweetened beverages -simple starches (rice, bread, potatoes, pasta, chips, etc - small amounts of 100% whole  grains are ok) -red meat, pork, butter -fried foods, fast food, processed food, excessive dairy, eggs and coconut.  3)Get at least 150 minutes of sweaty aerobic exercise per week.  4)Reduce stress - consider counseling, meditation and relaxation to balance other aspects of your life.          Lucretia Kern, DO

## 2017-08-10 ENCOUNTER — Encounter: Payer: Self-pay | Admitting: Family Medicine

## 2017-08-10 ENCOUNTER — Ambulatory Visit: Payer: 59 | Admitting: Family Medicine

## 2017-08-10 VITALS — BP 90/60 | HR 77 | Temp 97.9°F | Ht 70.0 in | Wt 214.2 lb

## 2017-08-10 DIAGNOSIS — E1159 Type 2 diabetes mellitus with other circulatory complications: Secondary | ICD-10-CM | POA: Diagnosis not present

## 2017-08-10 DIAGNOSIS — K649 Unspecified hemorrhoids: Secondary | ICD-10-CM

## 2017-08-10 DIAGNOSIS — E1169 Type 2 diabetes mellitus with other specified complication: Secondary | ICD-10-CM | POA: Diagnosis not present

## 2017-08-10 DIAGNOSIS — E119 Type 2 diabetes mellitus without complications: Secondary | ICD-10-CM | POA: Diagnosis not present

## 2017-08-10 DIAGNOSIS — K6289 Other specified diseases of anus and rectum: Secondary | ICD-10-CM | POA: Diagnosis not present

## 2017-08-10 DIAGNOSIS — I10 Essential (primary) hypertension: Secondary | ICD-10-CM | POA: Diagnosis not present

## 2017-08-10 DIAGNOSIS — I152 Hypertension secondary to endocrine disorders: Secondary | ICD-10-CM

## 2017-08-10 DIAGNOSIS — E785 Hyperlipidemia, unspecified: Secondary | ICD-10-CM

## 2017-08-10 LAB — LIPID PANEL
CHOL/HDL RATIO: 4
CHOLESTEROL: 169 mg/dL (ref 0–200)
HDL: 38.5 mg/dL — ABNORMAL LOW (ref >39.00)
LDL Cholesterol: 100 mg/dL — ABNORMAL HIGH (ref 0–99)
NonHDL: 130.28
TRIGLYCERIDES: 152 mg/dL — AB (ref 0.0–149.0)
VLDL: 30.4 mg/dL (ref 0.0–40.0)

## 2017-08-10 LAB — CBC
HCT: 42.8 % (ref 39.0–52.0)
HEMOGLOBIN: 13.8 g/dL (ref 13.0–17.0)
MCHC: 32.3 g/dL (ref 30.0–36.0)
MCV: 86.5 fl (ref 78.0–100.0)
Platelets: 265 10*3/uL (ref 150.0–400.0)
RBC: 4.94 Mil/uL (ref 4.22–5.81)
RDW: 14.7 % (ref 11.5–15.5)
WBC: 5 10*3/uL (ref 4.0–10.5)

## 2017-08-10 LAB — BASIC METABOLIC PANEL
BUN: 16 mg/dL (ref 6–23)
CALCIUM: 10.2 mg/dL (ref 8.4–10.5)
CO2: 27 mEq/L (ref 19–32)
Chloride: 104 mEq/L (ref 96–112)
Creatinine, Ser: 0.93 mg/dL (ref 0.40–1.50)
GFR: 86.83 mL/min (ref >60.00)
Glucose, Bld: 182 mg/dL — ABNORMAL HIGH (ref 70–99)
Potassium: 4.6 mEq/L (ref 3.5–5.1)
Sodium: 143 mEq/L (ref 135–145)

## 2017-08-10 LAB — HEMOGLOBIN A1C: Hgb A1c MFr Bld: 7.4 % — ABNORMAL HIGH (ref 4.6–6.5)

## 2017-08-10 NOTE — Patient Instructions (Signed)
BEFORE YOU LEAVE: -Labs -follow up: Physical in 3-4 months  -We placed a referral for you as discussed to the gastroenterologist. It usually takes about 1-2 weeks to process and schedule this referral. If you have not heard from [place] regarding this appointment in 2 weeks please contact our office.  We have ordered labs or studies at this visit. It can take up to 1-2 weeks for results and processing. IF results require follow up or explanation, we will call you with instructions. Clinically stable results will be released to your [facility] If you have not heard from [place] or cannot find your results in [facility] in 2 weeks please contact our office at [phone].  If you are not yet signed up for [facility], please consider signing up.   We recommend the following healthy lifestyle for LIFE: 1) Small portions. But, make sure to get regular (at least 3 per day), healthy meals and small healthy snacks if needed.  2) Eat a healthy clean diet.   TRY TO EAT: -at least 5-7 servings of low sugar, colorful, and nutrient rich vegetables per day (not corn, potatoes or bananas.) -berries are the best choice if you wish to eat fruit (only eat small amounts if trying to reduce weight)  -lean meets (fish, white meat of chicken or [place]) -vegan proteins for some meals - beans or tofu, whole grains, nuts and seeds -Replace bad fats with good fats - good fats include: fish, nuts and seeds, canola oil, olive oil -small amounts of low fat or non fat dairy -small amounts of100 % whole grains - check the lables -drink plenty of water  AVOID: -SUGAR, sweets, anything with added sugar, corn syrup or sweeteners - must read labels as even foods advertised as "healthy" often are loaded with sugar -if you must have a sweetener, small amounts of stevia may be best -sweetened beverages and artificially sweetened beverages -simple starches (rice, bread, potatoes, pasta, chips, etc - small amounts of 100% whole grains are  ok) -red meat, pork, butter -fried foods, fast food, processed food, excessive dairy, eggs and coconut.  3)Get at least 150 minutes of sweaty aerobic exercise per week.  4)Reduce stress - consider counseling, meditation and relaxation to balance other aspects of your life.

## 2017-08-11 MED ORDER — ROSUVASTATIN CALCIUM 20 MG PO TABS: 20.0000 mg | ORAL_TABLET | Freq: Every day | ORAL | 3 refills | Status: DC

## 2017-08-11 NOTE — Addendum Note (Signed)
Addended by: Agnes Lawrence on: 08/11/2017 04:56 PM   Modules accepted: Orders

## 2017-08-24 ENCOUNTER — Other Ambulatory Visit: Payer: Self-pay | Admitting: Family Medicine

## 2017-09-20 ENCOUNTER — Other Ambulatory Visit: Payer: Self-pay | Admitting: Family Medicine

## 2017-10-06 ENCOUNTER — Telehealth: Payer: Self-pay | Admitting: Family Medicine

## 2017-10-06 NOTE — Telephone Encounter (Signed)
Copied from [place] [id number]. Topic: Quick Communication - See Telephone Encounter >> Oct 06, 2017  3:50 PM Percell Belt A wrote: CRM for notification. See Telephone encounter for: 10/06/17. Pt called in and stated that Dr Maudie Mercury wrote down 3 meds on a piece of paper at last visit for Sugar meds?  He lost this paper and would like to know what those 3 meds were???    Best number [phone]

## 2017-10-09 NOTE — Telephone Encounter (Signed)
I called the pt and left a detailed message asking that he call to check for insurance coverage with the names of the following medications:  Victoza, Trulicity, Bydureon (see lab test results) and to call back for an appt.

## 2017-10-09 NOTE — Progress Notes (Signed)
HPI:  Using dictation device. Unfortunately this device frequently misinterprets words/phrases.  Raymond Vasquez is a pleasant 67 y.o. here for follow up. Chronic medical problems summarized below were reviewed for changes. Reports trying to work on a healthy diet and getting more activity. He checked with his insurance on medications for diabetes and it looks like victoza would be the bes toption if he has to add a medication. Sliced finger with clean kitchen knife 2 days ago - L index. Cleaned well and healing well. Denies CP, SOB, DOE, treatment intolerance or new symptoms. Due for foot exam, eye exam, labs, check on gi referral  DM: -meds: metformin, amaryl, januvia  HTN: -meds: lisinopril  HLD/Obesity: -crestor (from simvastatin in 3/19) -tolerating well  Perianal discomfort/hx hemorrhoids: -referred to GI last visit - appear office called him, but he did not return call  ROS: See pertinent positives and negatives per HPI.  Past Medical History:  Diagnosis Date  . Angioedema    he is unsure of this or cause  . 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 Diabetes mellitus type II, controlled, with no complications ([place])   . 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 Hyperlipidemia   . Hypertension   . Nephrolithiasis    1980s  . OSA (obstructive sleep apnea)   . Shingles    2014    Past Surgical History:  Procedure Laterality Date  . APPENDECTOMY    . FINGER SURGERY  06/2012  . LAPAROSCOPIC GASTRIC BANDING  08/10/2009    Family History  Problem Relation Age of Onset  . Diabetes Unknown   . Heart disease Mother     SOCIAL HX: see hpi   Current Outpatient Medications:  .  glimepiride (AMARYL) 2 MG tablet, TAKE 2 TABLETS BY MOUTH DAILY WITH BREAKFAST, Disp: 180 tablet, Rfl: 0 .  hydrocortisone (ANUSOL-HC) 2.5 % rectal cream, Place 1 application rectally 2 (two) times daily., Disp: 30 g, Rfl: 0 .  JANUVIA 100 MG tablet, TAKE 1 TABLET BY MOUTH   DAILY, Disp: 90 tablet, Rfl: 0 .  lisinopril (PRINIVIL,ZESTRIL) 20 MG tablet, TAKE 1 TABLET BY MOUTH DAILY, Disp: 90 tablet, Rfl: 1 .  lisinopril (PRINIVIL,ZESTRIL) 20 MG tablet, TAKE 1 TABLET BY MOUTH EVERY DAY, Disp: 90 tablet, Rfl: 0 .  Multiple Vitamin (MULTIVITAMIN WITH MINERALS) TABS tablet, Take 1 tablet by mouth daily., Disp: , Rfl:  .  nitroGLYCERIN (NITROGLYN) 2 % ointment, Apply small amount twice daily for 8 weeks to external area of concern., Disp: 30 g, Rfl: 0 .  ONETOUCH DELICA LANCETS FINE MISC, Use as instructed by provider, Disp: 200 each, Rfl: 3 .  ONETOUCH VERIO test strip, CHECK BLOOD SUGAR THREE TIMES DAILY, Disp: 100 each, Rfl: 5 .  rosuvastatin (CRESTOR) 20 MG tablet, Take 1 tablet (20 mg total) by mouth daily., Disp: 90 tablet, Rfl: 3  EXAM:  Vitals:   10/10/17 0947  BP: 90/68  Pulse: 91  Temp: 97.7 F (36.5 C)    Body mass index is 29.3 kg/m.  GENERAL: vitals reviewed and listed above, alert, oriented, appears well hydrated and in no acute distress  HEENT: atraumatic, conjunttiva clear, no obvious abnormalities on inspection of external nose and ears  NECK: no obvious masses on inspection  SKIN: clean healing lac L index finger, no signs infection  LUNGS: clear to auscultation bilaterally, no wheezes, rales or rhonchi, good air movement  CV: HRRR, no peripheral edema  MS: moves all extremities without noticeable abnormality PSYCH: pleasant and  cooperative, no obvious depression or anxiety  Foot exam done  ASSESSMENT AND PLAN:  Discussed the following assessment and plan:  Type 2 diabetes mellitus without complication, without long-term current use of insulin ([place]) - Plan: Hemoglobin A1c  Hyperlipidemia associated with type 2 diabetes mellitus ([place]) - Plan: Lipid panel  Hypertension associated with diabetes ([place]) - Plan: Basic metabolic panel  Laceration of skin of finger, initial encounter  -discussed various options for diabetes - he wants  to add victoza if needs further treatment, will check in a month and then decide, encouraged healthy low sugar diet and regular aerobic exercise -foot exam done -eye exam advised -check lipids in 1 month -finger healing well, continue to keep clean and driy -labs 1 month, follow up 4 months -Patient advised to return or notify a doctor immediately if symptoms worsen or persist or new concerns arise.  Patient Instructions  BEFORE YOU LEAVE: -labs in about 1 month - fasting -follow up: 4 months   We recommend the following healthy lifestyle for LIFE: 1) Small portions. But, make sure to get regular (at least 3 per day), healthy meals and small healthy snacks if needed.  2) Eat a healthy clean diet.   TRY TO EAT: -at least 5-7 servings of low sugar, colorful, and nutrient rich vegetables per day (not corn, potatoes or bananas.) -berries are the best choice if you wish to eat fruit (only eat small amounts if trying to reduce weight)  -lean meets (fish, white meat of chicken or [place]) -vegan proteins for some meals - beans or tofu, whole grains, nuts and seeds -Replace bad fats with good fats - good fats include: fish, nuts and seeds, canola oil, olive oil -small amounts of low fat or non fat dairy -small amounts of100 % whole grains - check the lables -drink plenty of water  AVOID: -SUGAR, sweets, anything with added sugar, corn syrup or sweeteners - must read labels as even foods advertised as "healthy" often are loaded with sugar -if you must have a sweetener, small amounts of stevia may be best -sweetened beverages and artificially sweetened beverages -simple starches (rice, bread, potatoes, pasta, chips, etc - small amounts of 100% whole grains are ok) -red meat, pork, butter -fried foods, fast food, processed food, excessive dairy, eggs and coconut.  3)Get at least 150 minutes of sweaty aerobic exercise per week.  4)Reduce stress - consider counseling, meditation and  relaxation to balance other aspects of your life.     Lucretia Kern, DO

## 2017-10-10 ENCOUNTER — Telehealth: Payer: Self-pay | Admitting: Family Medicine

## 2017-10-10 ENCOUNTER — Ambulatory Visit (INDEPENDENT_AMBULATORY_CARE_PROVIDER_SITE_OTHER): Payer: 59 | Admitting: Family Medicine

## 2017-10-10 ENCOUNTER — Encounter: Payer: Self-pay | Admitting: Family Medicine

## 2017-10-10 VITALS — BP 90/68 | HR 91 | Temp 97.7°F | Ht 70.0 in | Wt 204.2 lb

## 2017-10-10 DIAGNOSIS — S61219A Laceration without foreign body of unspecified finger without damage to nail, initial encounter: Secondary | ICD-10-CM

## 2017-10-10 DIAGNOSIS — E1169 Type 2 diabetes mellitus with other specified complication: Secondary | ICD-10-CM | POA: Diagnosis not present

## 2017-10-10 DIAGNOSIS — I10 Essential (primary) hypertension: Secondary | ICD-10-CM

## 2017-10-10 DIAGNOSIS — E119 Type 2 diabetes mellitus without complications: Secondary | ICD-10-CM

## 2017-10-10 DIAGNOSIS — E1159 Type 2 diabetes mellitus with other circulatory complications: Secondary | ICD-10-CM | POA: Diagnosis not present

## 2017-10-10 DIAGNOSIS — E785 Hyperlipidemia, unspecified: Secondary | ICD-10-CM | POA: Diagnosis not present

## 2017-10-10 DIAGNOSIS — I152 Hypertension secondary to endocrine disorders: Secondary | ICD-10-CM

## 2017-10-10 MED ORDER — SITAGLIPTIN PHOSPHATE 100 MG PO TABS: 100.0000 mg | ORAL_TABLET | Freq: Every day | ORAL | 3 refills | Status: DC

## 2017-10-10 NOTE — Telephone Encounter (Signed)
Copied from [place] [id number]. Topic: Inquiry >> Oct 10, 2017 11:10 AM Conception Chancy, NT wrote: Reason for CRM: patient is calling and states that he seen Dr. Maudie Mercury today and would like a call back from her nurse to discus the Januvia RX states the pharmacy will not tell him how much they charge unless there is a RX script sent over.

## 2017-10-10 NOTE — Patient Instructions (Signed)
BEFORE YOU LEAVE: -labs in about 1 month - fasting -follow up: 4 months   We recommend the following healthy lifestyle for LIFE: 1) Small portions. But, make sure to get regular (at least 3 per day), healthy meals and small healthy snacks if needed.  2) Eat a healthy clean diet.   TRY TO EAT: -at least 5-7 servings of low sugar, colorful, and nutrient rich vegetables per day (not corn, potatoes or bananas.) -berries are the best choice if you wish to eat fruit (only eat small amounts if trying to reduce weight)  -lean meets (fish, white meat of chicken or [place]) -vegan proteins for some meals - beans or tofu, whole grains, nuts and seeds -Replace bad fats with good fats - good fats include: fish, nuts and seeds, canola oil, olive oil -small amounts of low fat or non fat dairy -small amounts of100 % whole grains - check the lables -drink plenty of water  AVOID: -SUGAR, sweets, anything with added sugar, corn syrup or sweeteners - must read labels as even foods advertised as "healthy" often are loaded with sugar -if you must have a sweetener, small amounts of stevia may be best -sweetened beverages and artificially sweetened beverages -simple starches (rice, bread, potatoes, pasta, chips, etc - small amounts of 100% whole grains are ok) -red meat, pork, butter -fried foods, fast food, processed food, excessive dairy, eggs and coconut.  3)Get at least 150 minutes of sweaty aerobic exercise per week.  4)Reduce stress - consider counseling, meditation and relaxation to balance other aspects of your life.

## 2017-10-10 NOTE — Telephone Encounter (Signed)
I called the pt and he stated he needs a discount card for Januvia.  I informed the pt the card was left at the front desk for him to pick up and he agreed.

## 2017-10-24 ENCOUNTER — Encounter: Payer: Self-pay | Admitting: *Deleted

## 2017-10-29 ENCOUNTER — Other Ambulatory Visit: Payer: Self-pay | Admitting: Family Medicine

## 2017-11-02 ENCOUNTER — Ambulatory Visit: Payer: 59 | Admitting: Internal Medicine

## 2017-11-02 ENCOUNTER — Encounter: Payer: Self-pay | Admitting: Internal Medicine

## 2017-11-02 VITALS — BP 110/78 | HR 66 | Ht 68.5 in | Wt 201.5 lb

## 2017-11-02 DIAGNOSIS — Z1211 Encounter for screening for malignant neoplasm of colon: Secondary | ICD-10-CM

## 2017-11-02 DIAGNOSIS — K649 Unspecified hemorrhoids: Secondary | ICD-10-CM | POA: Diagnosis not present

## 2017-11-02 MED ORDER — SUPREP BOWEL PREP KIT 17.5-3.13-1.6 GM/177ML PO SOLN: 1.0000 | ORAL | 0 refills | Status: DC

## 2017-11-02 NOTE — Progress Notes (Signed)
Patient ID: BARTON WANT, male   DOB: [birth year], 67 y.o.   MRN: [id number] HPI: Raymond Vasquez is a 67 year old male with a past medical history of hemorrhoids, obesity status post lap band placement in March 2011, diabetes, hypertension, hyperlipidemia and sleep apnea who is seen in consultation at the request of Dr. Maudie Mercury to discuss hemorrhoids.  He is here alone today.  He reports that 3 or 4 months ago he developed perianal irritation, discomfort and red blood with wiping after bowel movements.  He reports it was uncomfortable to sit and he felt perianal and distal rectal irritation.  Previously constipation was a bit of an issue but this has completely resolved.  Bowel movements have been regular usually once per day without diarrhea.  He has had no abdominal pain.  Perianal and rectal symptoms have completely resolved and been absent for at least 2 months.  No further rectal bleeding.  Good appetite.  No upper GI or hepatobiliary complaint.  No family history of colon cancer.  Strong family history of diabetes.  I performed a screening colonoscopy on 06/12/2013.  This was with a fair prep.  A 4 to 5 mm sigmoid polyp was removed but found to be hyperplastic.  No adenomatous change.  External hemorrhoids were seen on that day.  Past Medical History:  Diagnosis Date  . Angioedema    he is unsure of this or cause  . 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 Diabetes mellitus type II, controlled, with no complications ([place])   . Hemorrhoids   . 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 Hyperlipidemia   . Hypertension   . Nephrolithiasis    1980s  . OSA (obstructive sleep apnea)   . Shingles    2014    Past Surgical History:  Procedure Laterality Date  . APPENDECTOMY    . FINGER SURGERY  06/2012  . LAPAROSCOPIC GASTRIC BANDING  08/10/2009    Outpatient Medications Prior to Visit  Medication Sig Dispense Refill  . glimepiride (AMARYL) 2 MG tablet TAKE 2 TABLETS BY MOUTH DAILY  WITH BREAKFAST 180 tablet 0  . lisinopril (PRINIVIL,ZESTRIL) 20 MG tablet TAKE 1 TABLET BY MOUTH DAILY 90 tablet 1  . metFORMIN (GLUCOPHAGE) 1000 MG tablet TAKE 1 TABLET BY MOUTH TWICE DAILY WITH MEAL 180 tablet 1  . ONETOUCH DELICA LANCETS FINE MISC Use as instructed by provider 200 each 3  . ONETOUCH VERIO test strip CHECK BLOOD SUGAR THREE TIMES DAILY 100 each 5  . rosuvastatin (CRESTOR) 20 MG tablet Take 1 tablet (20 mg total) by mouth daily. 90 tablet 3  . sitaGLIPtin (JANUVIA) 100 MG tablet Take 1 tablet (100 mg total) by mouth daily. 30 tablet 3  . hydrocortisone (ANUSOL-HC) 2.5 % rectal cream Place 1 application rectally 2 (two) times daily. 30 g 0  . lisinopril (PRINIVIL,ZESTRIL) 20 MG tablet TAKE 1 TABLET BY MOUTH EVERY DAY 90 tablet 0  . Multiple Vitamin (MULTIVITAMIN WITH MINERALS) TABS tablet Take 1 tablet by mouth daily.    . nitroGLYCERIN (NITROGLYN) 2 % ointment Apply small amount twice daily for 8 weeks to external area of concern. 30 g 0   No facility-administered medications prior to visit.     No Known Allergies  Family History  Problem Relation Age of Onset  . Heart disease Mother   . Diabetes Unknown     Social History   Tobacco Use  . Smoking status: Current Some Day Smoker    Types: Cigars  . Smokeless  tobacco: Current User    Types: Chew  . Tobacco comment: 1 cigar occassionally, not more then 1x per week  Substance Use Topics  . Alcohol use: No  . Drug use: No    ROS: As per history of present illness, otherwise negative  BP 110/78   Pulse 66   Ht 5' 8.5" (1.74 m)   Wt 201 lb 8 oz (91.4 kg)   BMI 30.19 kg/m  Constitutional: Well-developed and well-nourished. No distress. HEENT: Normocephalic and atraumatic.  Conjunctivae are normal.  No scleral icterus. Neck: Neck supple. Trachea midline. Cardiovascular: Normal rate, regular rhythm and intact distal pulses. Pulmonary/chest: Effort normal and breath sounds normal. No wheezing, rales or  rhonchi. Abdominal: Soft, nontender, nondistended. Bowel sounds active throughout. There are no masses palpable. No hepatosplenomegaly. Extremities: no clubbing, cyanosis, or edema Neurological: Alert and oriented to person place and time. Skin: Skin is warm and dry.  Psychiatric: Normal mood and affect. Behavior is normal.  RELEVANT LABS AND IMAGING: CBC    Component Value Date/Time   WBC 5.0 08/10/2017 0929   RBC 4.94 08/10/2017 0929   HGB 13.8 08/10/2017 0929   HCT 42.8 08/10/2017 0929   PLT 265.0 08/10/2017 0929   MCV 86.5 08/10/2017 0929   MCH 28.9 08/03/2013 0255   MCHC 32.3 08/10/2017 0929   RDW 14.7 08/10/2017 0929   LYMPHSABS 0.4 (L) 08/03/2013 0255   MONOABS 0.9 08/03/2013 0255   EOSABS 0.1 08/03/2013 0255   BASOSABS 0.0 08/03/2013 0255    CMP     Component Value Date/Time   NA 143 08/10/2017 0929   K 4.6 08/10/2017 0929   CL 104 08/10/2017 0929   CO2 27 08/10/2017 0929   GLUCOSE 182 (H) 08/10/2017 0929   BUN 16 08/10/2017 0929   CREATININE 0.93 08/10/2017 0929   CALCIUM 10.2 08/10/2017 0929   PROT 7.2 03/21/2014 0905   ALBUMIN 3.9 03/21/2014 0905   AST 18 03/21/2014 0905   ALT 21 03/21/2014 0905   ALKPHOS 43 03/21/2014 0905   BILITOT 0.6 03/21/2014 0905   GFRNONAA 89 (L) 08/03/2013 0255   GFRAA >90 08/03/2013 0255    ASSESSMENT/PLAN:  67 year old male with a past medical history of hemorrhoids, obesity status post lap band placement in March 2011, diabetes, hypertension, hyperlipidemia and sleep apnea who is seen in consultation at the request of Dr. Maudie Mercury to discuss hemorrhoids.   1.  Hemorrhoids --symptoms several months ago which are not present now.  Difficult to know if this was secondary to internal or external hemorrhoids though with intermittent bleeding I expect at least an internal hemorrhoid component.  We discussed hemorrhoidal disease today and the options for hemorrhoidal banding should symptoms become recurrent.  We discussed his prior  colonoscopy and given the fair bowel preparation I recommend we repeat a screening colonoscopy.  While this is for screening, we will also be able to examine for internal and external hemorrhoids on this day.  Further treatment recommendations after screening colonoscopy is repeated.  2.  CRC screening --no history of polyps but fair prep 4-1/2 years ago.  Given quality of the bowel preparation I recommend we repeat screening colonoscopy at this time.  We discussed the risk, benefits and alternatives and he is agreeable and wishes to proceed.   Cc:Kim, Nickola Major, [address] [place], [place] [address]

## 2017-11-02 NOTE — Patient Instructions (Signed)
You have been scheduled for a colonoscopy. Please follow written instructions given to you at your visit today.  Please pick up your prep supplies at the pharmacy within the next 1-3 days. If you use inhalers (even only as needed), please bring them with you on the day of your procedure. Your physician has requested that you go to www.startemmi.com and enter the access code given to you at your visit today. This web site gives a general overview about your procedure. However, you should still follow specific instructions given to you by our office regarding your preparation for the procedure.  If you are age 1 or older, your body mass index should be between 23-30. Your Body mass index is 30.19 kg/m. If this is out of the aforementioned range listed, please consider follow up with your Primary Care Provider.  If you are age 23 or younger, your body mass index should be between 19-25. Your Body mass index is 30.19 kg/m. If this is out of the aformentioned range listed, please consider follow up with your Primary Care Provider.

## 2017-11-13 ENCOUNTER — Other Ambulatory Visit (INDEPENDENT_AMBULATORY_CARE_PROVIDER_SITE_OTHER): Payer: 59

## 2017-11-13 DIAGNOSIS — I10 Essential (primary) hypertension: Secondary | ICD-10-CM

## 2017-11-13 DIAGNOSIS — I152 Hypertension secondary to endocrine disorders: Secondary | ICD-10-CM

## 2017-11-13 DIAGNOSIS — E1169 Type 2 diabetes mellitus with other specified complication: Secondary | ICD-10-CM | POA: Diagnosis not present

## 2017-11-13 DIAGNOSIS — E1159 Type 2 diabetes mellitus with other circulatory complications: Secondary | ICD-10-CM | POA: Diagnosis not present

## 2017-11-13 DIAGNOSIS — E785 Hyperlipidemia, unspecified: Secondary | ICD-10-CM

## 2017-11-13 DIAGNOSIS — E119 Type 2 diabetes mellitus without complications: Secondary | ICD-10-CM | POA: Diagnosis not present

## 2017-11-13 LAB — BASIC METABOLIC PANEL
BUN: 18 mg/dL (ref 6–23)
CO2: 32 mEq/L (ref 19–32)
CREATININE: 0.86 mg/dL (ref 0.40–1.50)
Calcium: 9.9 mg/dL (ref 8.4–10.5)
Chloride: 102 mEq/L (ref 96–112)
GFR: 94.96 mL/min (ref >60.00)
GLUCOSE: 131 mg/dL — AB (ref 70–99)
Potassium: 4.7 mEq/L (ref 3.5–5.1)
Sodium: 142 mEq/L (ref 135–145)

## 2017-11-13 LAB — LIPID PANEL
CHOLESTEROL: 129 mg/dL (ref 0–200)
HDL: 41.8 mg/dL (ref >39.00)
LDL CALC: 61 mg/dL (ref 0–99)
NonHDL: 87.36
TRIGLYCERIDES: 130 mg/dL (ref 0.0–149.0)
Total CHOL/HDL Ratio: 3
VLDL: 26 mg/dL (ref 0.0–40.0)

## 2017-11-13 LAB — HEMOGLOBIN A1C: Hgb A1c MFr Bld: 8.4 % — ABNORMAL HIGH (ref 4.6–6.5)

## 2017-11-15 ENCOUNTER — Other Ambulatory Visit: Payer: Self-pay

## 2017-11-15 ENCOUNTER — Encounter: Payer: Self-pay | Admitting: Internal Medicine

## 2017-11-15 ENCOUNTER — Ambulatory Visit (AMBULATORY_SURGERY_CENTER): Payer: 59 | Admitting: Internal Medicine

## 2017-11-15 VITALS — BP 118/65 | HR 72 | Temp 99.1°F | Resp 9 | Ht 68.0 in | Wt 201.0 lb

## 2017-11-15 DIAGNOSIS — D122 Benign neoplasm of ascending colon: Secondary | ICD-10-CM

## 2017-11-15 DIAGNOSIS — Z1211 Encounter for screening for malignant neoplasm of colon: Secondary | ICD-10-CM | POA: Diagnosis present

## 2017-11-15 DIAGNOSIS — D123 Benign neoplasm of transverse colon: Secondary | ICD-10-CM

## 2017-11-15 MED ORDER — SODIUM CHLORIDE 0.9 % IV SOLN: 500.0000 mL | Freq: Once | INTRAVENOUS | Status: DC

## 2017-11-15 NOTE — Patient Instructions (Signed)
**   Handouts given on polyps, diverticulosis, and hemorrhoids **   YOU HAD AN ENDOSCOPIC PROCEDURE TODAY AT THE [facility]:   Refer to the procedure report that was given to you for any specific questions about what was found during the examination.  If the procedure report does not answer your questions, please call your gastroenterologist to clarify.  If you requested that your care partner not be given the details of your procedure findings, then the procedure report has been included in a sealed envelope for you to review at your convenience later.  YOU SHOULD EXPECT: Some feelings of bloating in the abdomen. Passage of more gas than usual.  Walking can help get rid of the air that was put into your GI tract during the procedure and reduce the bloating. If you had a lower endoscopy (such as a colonoscopy or flexible sigmoidoscopy) you may notice spotting of blood in your stool or on the toilet paper. If you underwent a bowel prep for your procedure, you may not have a normal bowel movement for a few days.  Please Note:  You might notice some irritation and congestion in your nose or some drainage.  This is from the oxygen used during your procedure.  There is no need for concern and it should clear up in a day or so.  SYMPTOMS TO REPORT IMMEDIATELY:   Following lower endoscopy (colonoscopy or flexible sigmoidoscopy):  Excessive amounts of blood in the stool  Significant tenderness or worsening of abdominal pains  Swelling of the abdomen that is new, acute  Fever of 100F or higher  For urgent or emergent issues, a gastroenterologist can be reached at any hour by calling [phone].   DIET:  We do recommend a small meal at first, but then you may proceed to your regular diet.  Drink plenty of fluids but you should avoid alcoholic beverages for 24 hours.  ACTIVITY:  You should plan to take it easy for the rest of today and you should NOT DRIVE or use heavy machinery until  tomorrow (because of the sedation medicines used during the test).    FOLLOW UP: Our staff will call the number listed on your records the next business day following your procedure to check on you and address any questions or concerns that you may have regarding the information given to you following your procedure. If we do not reach you, we will leave a message.  However, if you are feeling well and you are not experiencing any problems, there is no need to return our call.  We will assume that you have returned to your regular daily activities without incident.  If any biopsies were taken you will be contacted by phone or by letter within the next 1-3 weeks.  Please call us at [phone] if you have not heard about the biopsies in 3 weeks.    SIGNATURES/CONFIDENTIALITY: You and/or your care partner have signed paperwork which will be entered into your electronic medical record.  These signatures attest to the fact that that the information above on your After Visit Summary has been reviewed and is understood.  Full responsibility of the confidentiality of this discharge information lies with you and/or your care-partner. 

## 2017-11-15 NOTE — Op Note (Signed)
[facility] Patient Name: Raymond Vasquez Procedure Date: 11/15/2017 11:41 AM MRN: [id number] Endoscopist: Jerene Bears , MD Age: 67 Referring MD:  Date of Birth: 09/26/1950 Gender: Male Account #: [id number] Procedure:                Colonoscopy Indications:              Screening for colorectal malignant neoplasm; last                            colonoscopy 4.5 years ago with fair prep Medicines:                Monitored Anesthesia Care Procedure:                Pre-Anesthesia Assessment:                           - Prior to the procedure, a History and Physical                            was performed, and patient medications and                            allergies were reviewed. The patient's tolerance of                            previous anesthesia was also reviewed. The risks                            and benefits of the procedure and the sedation                            options and risks were discussed with the patient.                            All questions were answered, and informed consent                            was obtained. Prior Anticoagulants: The patient has                            taken no previous anticoagulant or antiplatelet                            agents. ASA Grade Assessment: II - A patient with                            mild systemic disease. After reviewing the risks                            and benefits, the patient was deemed in                            satisfactory condition to undergo the procedure.  After obtaining informed consent, the colonoscope                            was passed under direct vision. Throughout the                            procedure, the patient's blood pressure, pulse, and                            oxygen saturations were monitored continuously. The                            Model CF-HQ190L [id number]) scope was introduced                            through the anus and  advanced to the cecum,                            identified by appendiceal orifice and ileocecal                            valve. The colonoscopy was performed without                            difficulty. The patient tolerated the procedure                            well. The quality of the bowel preparation was good                            after copious irrigation and lavage. The ileocecal                            valve, appendiceal orifice, and rectum were                            photographed. Scope In: 11:48:16 AM Scope Out: 12:12:06 PM Scope Withdrawal Time: 0 hours 17 minutes 9 seconds  Total Procedure Duration: 0 hours 23 minutes 50 seconds  Findings:                 The digital rectal exam was normal.                           Two sessile polyps were found in the ascending                            colon. The polyps were 5 to 6 mm in size. These                            polyps were removed with a cold snare. Resection                            and retrieval were complete.  A 5 mm polyp was found in the transverse colon. The                            polyp was sessile. The polyp was removed with a                            cold snare. Resection and retrieval were complete.                           Internal hemorrhoids were found during                            retroflexion. The hemorrhoids were small. Complications:            No immediate complications. Estimated Blood Loss:     Estimated blood loss was minimal. Impression:               - Two 5 to 6 mm polyps in the ascending colon,                            removed with a cold snare. Resected and retrieved.                           - One 5 mm polyp in the transverse colon, removed                            with a cold snare. Resected and retrieved.                           - Internal hemorrhoids. Recommendation:           - Patient has a contact number available for                             emergencies. The signs and symptoms of potential                            delayed complications were discussed with the                            patient. Return to normal activities tomorrow.                            Written discharge instructions were provided to the                            patient.                           - Resume previous diet.                           - Continue present medications.                           -  Await pathology results.                           - Repeat colonoscopy is recommended. The                            colonoscopy date will be determined after pathology                            results from today's exam become available for                            review. Jerene Bears, MD 11/15/2017 12:15:40 PM This report has been signed electronically.

## 2017-11-15 NOTE — Progress Notes (Signed)
Called to room to assist during endoscopic procedure.  Patient ID and intended procedure confirmed with present staff. Received instructions for my participation in the procedure from the performing physician.  

## 2017-11-15 NOTE — Progress Notes (Signed)
Report given to PACU, vss 

## 2017-11-16 ENCOUNTER — Telehealth: Payer: Self-pay | Admitting: *Deleted

## 2017-11-16 NOTE — Telephone Encounter (Signed)
  Follow up Call-  Call back number 11/15/2017  Post procedure Call Back phone  # [phone]  Permission to leave phone message Yes  Some recent data might be hidden     Patient questions:  Do you have a fever, pain , or abdominal swelling? No. Pain Score  0 *  Have you tolerated food without any problems? Yes.    Have you been able to return to your normal activities? Yes.    Do you have any questions about your discharge instructions: Diet   No. Medications  No. Follow up visit  No.  Do you have questions or concerns about your Care? No.  Actions: * If pain score is 4 or above: No action needed, pain <4.

## 2017-11-16 NOTE — Addendum Note (Signed)
Addended by: Agnes Lawrence on: 11/16/2017 02:34 PM   Modules accepted: Orders

## 2017-11-20 ENCOUNTER — Encounter: Payer: Self-pay | Admitting: Internal Medicine

## 2017-11-27 ENCOUNTER — Encounter: Payer: Self-pay | Admitting: Family Medicine

## 2017-11-27 ENCOUNTER — Ambulatory Visit: Payer: 59 | Admitting: Family Medicine

## 2017-11-27 VITALS — BP 112/70 | HR 84 | Temp 98.4°F | Ht 68.0 in | Wt 197.6 lb

## 2017-11-27 DIAGNOSIS — M2550 Pain in unspecified joint: Secondary | ICD-10-CM | POA: Diagnosis not present

## 2017-11-27 LAB — CBC WITH DIFFERENTIAL/PLATELET
BASOS ABS: 0.1 10*3/uL (ref 0.0–0.1)
Basophils Relative: 0.8 % (ref 0.0–3.0)
Eosinophils Absolute: 0.2 10*3/uL (ref 0.0–0.7)
Eosinophils Relative: 1.9 % (ref 0.0–5.0)
HEMATOCRIT: 41.3 % (ref 39.0–52.0)
Hemoglobin: 13.6 g/dL (ref 13.0–17.0)
LYMPHS ABS: 1.3 10*3/uL (ref 0.7–4.0)
LYMPHS PCT: 15.7 % (ref 12.0–46.0)
MCHC: 33 g/dL (ref 30.0–36.0)
MCV: 86.3 fl (ref 78.0–100.0)
MONOS PCT: 8.7 % (ref 3.0–12.0)
Monocytes Absolute: 0.7 10*3/uL (ref 0.1–1.0)
NEUTROS PCT: 72.9 % (ref 43.0–77.0)
Neutro Abs: 6.1 10*3/uL (ref 1.4–7.7)
Platelets: 339 10*3/uL (ref 150.0–400.0)
RBC: 4.78 Mil/uL (ref 4.22–5.81)
RDW: 15.2 % (ref 11.5–15.5)
WBC: 8.4 10*3/uL (ref 4.0–10.5)

## 2017-11-27 LAB — C-REACTIVE PROTEIN: CRP: 2.7 mg/dL (ref 0.5–20.0)

## 2017-11-27 LAB — SEDIMENTATION RATE: Sed Rate: 27 mm/hr — ABNORMAL HIGH (ref 0–20)

## 2017-11-27 MED ORDER — TRAMADOL HCL 50 MG PO TABS: 100.0000 mg | ORAL_TABLET | Freq: Four times a day (QID) | ORAL | 0 refills | Status: DC | PRN

## 2017-11-27 MED ORDER — PREDNISONE 20 MG PO TABS: 20.0000 mg | ORAL_TABLET | Freq: Three times a day (TID) | ORAL | 0 refills | Status: DC

## 2017-11-27 NOTE — Progress Notes (Signed)
   Subjective:    Patient ID: Raymond Vasquez, male    DOB: [birth year], 67 y.o.   MRN: [id number]  HPI Here for 4 weeks of stiffness and pain in both shoulders, both hips and both knees. No joint swelling or fever. No rashes. This appeared rather suddenly. He saw his Orthopedist, Dr. Noemi Chapel, 2 weeks ago and he received cortisone shots in both shoulder and both knees. This helped the pain greatly but it worse off after a week. Dr. Noemi Chapel also out him on 7 days of Doxycycline to cover for possible Lyme disease. He is taking Diclofenac bid and adding Tramadol for pain relief. No family hx of arthritis.     Review of Systems  Constitutional: Negative.   Respiratory: Negative.   Cardiovascular: Negative.   Musculoskeletal: Positive for arthralgias and myalgias. Negative for joint swelling.  Skin: Negative for rash.  Neurological: Negative.        Objective:   Physical Exam  Constitutional: He appears well-developed and well-nourished. No distress.  Neck: No thyromegaly present.  Cardiovascular: Normal rate, regular rhythm, normal heart sounds and intact distal pulses.  Pulmonary/Chest: Effort normal and breath sounds normal. No stridor. No respiratory distress. He has no wheezes. He has no rales.  Musculoskeletal:  Both shoulders are tender diffusely and have reduced ROM due to pain. Both hips are stiff and both knees are also tender with reduced ROM. No obvious swelling.   Lymphadenopathy:    He has no cervical adenopathy.  Skin: No rash noted.          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 We will get labs to check for Lyme, RF, ESR, CRP, etc. We will start him on Prednisone 20 mg tid and he can add Tramadol prn pain. He will stop the Diclofenac. I warned him his glucoses may jump up and he will let us know if so. Written out of work today until 12-04-17. He will follow up with his PCP, Dr. Maudie Mercury.  Alysia Penna, MD

## 2017-11-28 LAB — RHEUMATOID FACTOR

## 2017-11-28 LAB — B. BURGDORFI ANTIBODIES: B burgdorferi Ab IgG+IgM: <0.9 index

## 2017-12-01 ENCOUNTER — Telehealth: Payer: Self-pay | Admitting: Family Medicine

## 2017-12-01 NOTE — Telephone Encounter (Signed)
Called and spoke with pt. Pt advised and voiced understanding.  

## 2017-12-01 NOTE — Telephone Encounter (Signed)
Decrease the prednisone to one tablet BID and see Dr. Maudie Mercury next week

## 2017-12-01 NOTE — Telephone Encounter (Signed)
Pt given results per notes of Dr. Sarajane Jews  on 11/28/17, patient verbalized understanding. Unable to document in result note due to result note not being routed to [facility]. Patient asks does he still need to continue the Prednisone. He says the pain is under control with the Prednisone and not taking the Tramadol, but says he doesn't want to take it if he doesn't need to. I advised this question will be sent to Dr. Sarajane Jews and someone will call with his recommendation. Patient says he will call back to schedule the follow up with Dr. Maudie Mercury.

## 2017-12-01 NOTE — Telephone Encounter (Signed)
Sent to Dr.Fry to advise  

## 2017-12-12 ENCOUNTER — Ambulatory Visit: Payer: 59 | Admitting: Family Medicine

## 2017-12-14 ENCOUNTER — Ambulatory Visit: Payer: 59 | Admitting: Family Medicine

## 2017-12-14 ENCOUNTER — Other Ambulatory Visit: Payer: Self-pay | Admitting: Family Medicine

## 2017-12-14 ENCOUNTER — Encounter: Payer: Self-pay | Admitting: Family Medicine

## 2017-12-14 VITALS — BP 120/80 | HR 68 | Temp 98.3°F | Ht 68.0 in | Wt 196.9 lb

## 2017-12-14 DIAGNOSIS — M2550 Pain in unspecified joint: Secondary | ICD-10-CM | POA: Diagnosis not present

## 2017-12-14 DIAGNOSIS — M353 Polymyalgia rheumatica: Secondary | ICD-10-CM

## 2017-12-14 DIAGNOSIS — E1169 Type 2 diabetes mellitus with other specified complication: Secondary | ICD-10-CM

## 2017-12-14 DIAGNOSIS — E785 Hyperlipidemia, unspecified: Secondary | ICD-10-CM

## 2017-12-14 DIAGNOSIS — E119 Type 2 diabetes mellitus without complications: Secondary | ICD-10-CM

## 2017-12-14 NOTE — Progress Notes (Signed)
HPI:  Using dictation device. Unfortunately this device frequently misinterprets words/phrases.  Acute visit for polyarthralgias: -reports started abort 2 months ago after vacation and then 2 weeks of pressure washing house -symptoms were pain in both knees and shoulders, initially also muscles "all over except back" -reports pain was severe, reports felt fine o/w without malaise, fatigue, fevers, rashes, GI symptoms, breathing issues, bleeding bruising.  -saw ortho and reports fluid was drained from one knee, he had xrays and also got cortisone injections in his shoulders and knees -then reports saw Dr. Sarajane Jews and had labs and oral prednisone -now feeling much better with symptoms pretty much resolved, finishing prednisone today -no tick bites, lyme testing neg -blood sugars a little higher on the prednisone, but improving on lower dose  ROS: See pertinent positives and negatives per HPI.  Past Medical History:  Diagnosis Date  . Angioedema    he is unsure of this or cause  . 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 Diabetes mellitus type II, controlled, with no complications ([employer])   . Hemorrhoids   . 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 Hyperlipidemia   . Hypertension   . Nephrolithiasis    1980s  . OSA (obstructive sleep apnea)   . Shingles    2014    Past Surgical History:  Procedure Laterality Date  . APPENDECTOMY    . FINGER SURGERY  06/2012  . LAPAROSCOPIC GASTRIC BANDING  08/10/2009    Family History  Problem Relation Age of Onset  . Heart disease Mother   . Diabetes Unknown     SOCIAL HX: see hpi   Current Outpatient Medications:  .  diclofenac (VOLTAREN) 75 MG EC tablet, TK 1 T PO BID WITH FOOD, Disp: , Rfl: 3 .  glimepiride (AMARYL) 2 MG tablet, TAKE 2 TABLETS BY MOUTH DAILY WITH BREAKFAST, Disp: 180 tablet, Rfl: 0 .  lisinopril (PRINIVIL,ZESTRIL) 20 MG tablet, TAKE 1 TABLET BY MOUTH DAILY, Disp: 90 tablet, Rfl: 1 .  metFORMIN  (GLUCOPHAGE) 1000 MG tablet, TAKE 1 TABLET BY MOUTH TWICE DAILY WITH MEAL, Disp: 180 tablet, Rfl: 1 .  ONETOUCH DELICA LANCETS FINE MISC, Use as instructed by provider, Disp: 200 each, Rfl: 3 .  ONETOUCH VERIO test strip, CHECK BLOOD SUGAR THREE TIMES DAILY, Disp: 100 each, Rfl: 5 .  predniSONE (DELTASONE) 20 MG tablet, Take 1 tablet (20 mg total) by mouth 3 (three) times daily., Disp: 30 tablet, Rfl: 0 .  rosuvastatin (CRESTOR) 20 MG tablet, Take 1 tablet (20 mg total) by mouth daily., Disp: 90 tablet, Rfl: 3 .  sitaGLIPtin (JANUVIA) 100 MG tablet, Take 1 tablet (100 mg total) by mouth daily., Disp: 30 tablet, Rfl: 3 .  traMADol (ULTRAM) 50 MG tablet, Take 2 tablets (100 mg total) by mouth every 6 (six) hours as needed for moderate pain., Disp: 60 tablet, Rfl: 0  Current Facility-Administered Medications:  .  0.9 %  sodium chloride infusion, 500 mL, Intravenous, Once, Pyrtle, Lajuan Lines, MD .  0.9 %  sodium chloride infusion, 500 mL, Intravenous, Once, Pyrtle, Lajuan Lines, MD  EXAM:  Vitals:   12/14/17 0719  BP: 120/80  Pulse: 68  Temp: 98.3 F (36.8 C)    Body mass index is 29.94 kg/m.  GENERAL: vitals reviewed and listed above, alert, oriented, appears well hydrated and in no acute distress  HEENT: atraumatic, conjunttiva clear, no obvious abnormalities on inspection of external nose and ears  NECK: no obvious masses on inspection  LUNGS: clear to  auscultation bilaterally, no wheezes, rales or rhonchi, good air movement  CV: HRRR, no peripheral edema  MS: moves all extremities without noticeable abnormality, no appreciable effusions of large joints, no sig rashes or redness on exposed portions, normal gait  PSYCH: pleasant and cooperative, no obvious depression or anxiety  ASSESSMENT AND PLAN:  Discussed the following assessment and plan:  Polyarthralgia - Plan: Cyclic citrul peptide antibody, IgG  Polymyalgia (HCC) - Plan: Parvovirus B19 Antibody, IGG and IGM  Hyperlipidemia  associated with type 2 diabetes mellitus (HCC)  Type 2 diabetes mellitus without complication, without long-term current use of insulin ([place])  -we discussed possible serious and likely etiologies, workup and treatment, treatment risks and return precautions - ? PMR, vs OA flare/myalgias from excessive change in activities vs viral vs other -after this discussion, Advik opted for since much better/pretty much resolved check ccp and pvb19 today, finish prednisone, healthy low sugar diet and plenty of water, back off on crestor for a few weeks, recheck 1 month (ortho f/u and rheum referral if symptoms return) -follow up advised 1 month  -of course, we advised [place]  to return or notify a doctor immediately if symptoms worsen or persist or new concerns arise.   Patient Instructions  BEFORE YOU LEAVE: -labs -follow up: 1 month  Decrease the crestor to twice weekly for the next 4 weeks.  Eat a healthy low sugar diet, plenty of water and veggies and fish.  We have ordered labs or studies at this visit. It can take up to 1-2 weeks for results and processing. IF results require follow up or explanation, we will call you with instructions. Clinically stable results will be released to your [facility]. If you have not heard from [place] or cannot find your results in [facility] in 2 weeks please contact our office at [phone].  If you are not yet signed up for [facility], please consider signing up.  I hope you continue to feel better. If not, please call if you get worse again and we will place a referral to the rheumatologist and I would recommend follow up with your orthopedic specialist as well.         Lucretia Kern, DO

## 2017-12-14 NOTE — Patient Instructions (Signed)
BEFORE YOU LEAVE: -labs -follow up: 1 month  Decrease the crestor to twice weekly for the next 4 weeks.  Eat a healthy low sugar diet, plenty of water and veggies and fish.  We have ordered labs or studies at this visit. It can take up to 1-2 weeks for results and processing. IF results require follow up or explanation, we will call you with instructions. Clinically stable results will be released to your [facility]. If you have not heard from [place] or cannot find your results in [facility] in 2 weeks please contact our office at [phone].  If you are not yet signed up for [facility], please consider signing up.  I hope you continue to feel better. If not, please call if you get worse again and we will place a referral to the rheumatologist and I would recommend follow up with your orthopedic specialist as well.

## 2017-12-18 ENCOUNTER — Telehealth: Payer: Self-pay | Admitting: Family Medicine

## 2017-12-18 DIAGNOSIS — M2550 Pain in unspecified joint: Secondary | ICD-10-CM

## 2017-12-18 DIAGNOSIS — M353 Polymyalgia rheumatica: Secondary | ICD-10-CM

## 2017-12-18 NOTE — Telephone Encounter (Signed)
I would recommend a referral to rheumatology - please place urgent referral for polyarthralgia/polymyalgia.  Follow up with ortho in the meantime - he should call them today for appt.  Also appointment with me in 2 weeks.  Recommend healthy diet, plenty of water, aleve or tylenol as needed in interim.

## 2017-12-18 NOTE — Telephone Encounter (Signed)
I called the pt and informed him of the message below.  Patient is aware the referral was placed and someone will call with appt info.  Follow up appt scheduled for 8/12.  He stated he has tried Aleve and Tylenol with no relief and asked if a refill on Tramadol could be given?  Message sent to Dr Maudie Mercury.

## 2017-12-18 NOTE — Telephone Encounter (Signed)
Copied from [place] [phone]. Topic: Quick Communication - See Telephone Encounter >> Dec 18, 2017  8:23 AM Hewitt Shorts wrote: Pt is needing to with Dr. Maudie Mercury -he is still having pain in shoulder and pretty much all over and the only thing that helps the pain is the prednisone but it is effecting my sugar levels   Best number [phone]

## 2017-12-19 ENCOUNTER — Telehealth: Payer: Self-pay | Admitting: Family Medicine

## 2017-12-19 LAB — CYCLIC CITRUL PEPTIDE ANTIBODY, IGG

## 2017-12-19 LAB — PARVOVIRUS B19 ANTIBODY, IGG AND IGM
Parvovirus B19 IgG: 6 — ABNORMAL HIGH (ref <0.9)
Parvovirus B19 IgM: 0.2 (ref <0.9)

## 2017-12-19 MED ORDER — TRAMADOL HCL 50 MG PO TABS: 50.0000 mg | ORAL_TABLET | Freq: Two times a day (BID) | ORAL | 0 refills | Status: DC | PRN

## 2017-12-19 NOTE — Telephone Encounter (Signed)
He needs to get in with his orthopedic specialist. Advise him of ortho walk in clinic options if his ortho doc cant get him in today or tomorrow.

## 2017-12-19 NOTE — Telephone Encounter (Signed)
I called the pt and informed his wife of the message below. 

## 2017-12-19 NOTE — Telephone Encounter (Signed)
Copied from [place] [phone]. Topic: General - Other >> Dec 19, 2017  8:00 AM Yvette Rack wrote: Reason for CRM: pt wife Raymond Vasquez  calling stating that the Tramadol isn't working that he is in a lot of pain she has to dress dress him because of the pain

## 2017-12-19 NOTE — Telephone Encounter (Signed)
Ok to sent #10 of tramadol, prn bid. But, needs ortho follow up if any further pain needs.

## 2017-12-19 NOTE — Telephone Encounter (Signed)
Rx done and I called the pt and left a detailed message with the info below.

## 2017-12-21 ENCOUNTER — Telehealth: Payer: Self-pay | Admitting: Family Medicine

## 2017-12-21 NOTE — Telephone Encounter (Signed)
Copied from [place] [id number]. Topic: Quick Communication - See Telephone Encounter >> Dec 21, 2017 11:46 AM Synthia Innocent wrote: CRM for notification. See Telephone encounter for: 12/21/17. Seen ortho dr today, Dr Noemi Chapel, ortho dr is not going to give him any pain meds. Able to refer to pain management? Patient is in a lot pain. Run out of tramadol in 3 days, will not be able to cope with pain without those. Please advise

## 2017-12-22 ENCOUNTER — Other Ambulatory Visit: Payer: Self-pay | Admitting: Family Medicine

## 2017-12-22 DIAGNOSIS — M2550 Pain in unspecified joint: Secondary | ICD-10-CM

## 2017-12-22 MED ORDER — TRAMADOL HCL 50 MG PO TABS: 100.0000 mg | ORAL_TABLET | Freq: Two times a day (BID) | ORAL | 0 refills | Status: AC | PRN

## 2017-12-22 NOTE — Telephone Encounter (Signed)
Check status of rheumatology referral. If the tramadol is helping with pain I am ok with doing another refill to help get him through weekend and ok also for pain mgmt if needed. Please find out how bad pain is, how much tramadol helps? How often and how he is actually taking.

## 2017-12-22 NOTE — Telephone Encounter (Signed)
Referral for Rheumatology was faxed 12/18/17.

## 2017-12-22 NOTE — Telephone Encounter (Signed)
Patient states that he is taking 2 pills of Tramadol twice daily with 2 Aleve. He said it helps with the pain.    [facility] [employer]

## 2017-12-22 NOTE — Telephone Encounter (Signed)
I called Raymond Vasquez to let him know I did send in a refill for tramadol. We discussed using sparingly. He has a hard time doing anything (dressing, driving) without pain medication at this point and does get relief with tramadol. He has been off work but goes back next week; he is hoping to get through his 2 on/2 off shift. Ortho would not fill his pain medicine and recommended pain mgmt for him. I did put in referral for this. He has appointment with rheumatology on 01/10/18. He states that since coming off prednisone the pain has significantly intensified.   PMP database reviewed and without concern.  Dr. Maudie Mercury - he had scheduled a 2 week follow up with you on 8/12. He is not sure if he needs to keep this appointment since he will be seeing rheumatology shortly. I told him I would forward this message to you and someone from office could advise whether he needed to follow up. Thanks!

## 2017-12-24 NOTE — Telephone Encounter (Signed)
Please let pt know. I agree with pain management referral and ortho referral. I recommend appt here for any further refills here. If doing better and does not feel needs further refills, back at work - ok to wait for visit with rheumatology/pain clinic.

## 2017-12-25 NOTE — Telephone Encounter (Signed)
I left a message for the pt to return my call.  CRM also created. 

## 2017-12-27 ENCOUNTER — Other Ambulatory Visit: Payer: Self-pay | Admitting: Family Medicine

## 2017-12-29 NOTE — Telephone Encounter (Signed)
Patient called back and I informed him of the message below.  Appt scheduled for 8/12.

## 2017-12-29 NOTE — Telephone Encounter (Signed)
I left a message for the pt to return my call. 

## 2017-12-29 NOTE — Telephone Encounter (Signed)
Patient says the Pain Management location where he was referred said they can "decide" if they can see him after 3 to 4 weeks. Patient would like a different location or other solution for pain. Will be out of pain medications by tonight.

## 2017-12-31 NOTE — Progress Notes (Signed)
HPI:  Using dictation device. Unfortunately this device frequently misinterprets  words/phrases.  Acute visit for shoulder and knee pain: -ongoing for several months -seeing ortho, when I saw him in July he was feeling better, but symptoms returned -ortho refused to rx tramadol and recommended pain clinic - they are upset this referral could take 3-4 weeks but wife deals with chronic pain and sees pain clinic and Dr. Letta Pate so is going to try to call to see if they can work him in sooner -referred to rheum for difuse arthralgias/myalgias -colleague refilled tramadol -reports: "I had a bad day Saturday", pain in R shoulder and R arm primarily, but also reports pain in the L shoulder and both knees, primarily R shoulder today -prednisone and steroid injs helps some but then pain would come right back and this spiked his sugars -denies:fevers, numbness, rash, swelling, redness. Neck or back pain -Cbc, bmplyme, ccp, crp, RF, parvovirus testing all ok -diabetes uncontrolled last check in June with hgba1c 8.4  ROS: See pertinent positives and negatives per HPI.  Past Medical History:  Diagnosis Date  . Angioedema    he is unsure of this or cause  . 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 Diabetes mellitus type II, controlled, with no complications ([facility])   . Hemorrhoids   . 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 Hyperlipidemia   . Hypertension   . Nephrolithiasis    1980s  . OSA (obstructive sleep apnea)   . Shingles    2014    Past Surgical History:  Procedure Laterality Date  . APPENDECTOMY    . FINGER SURGERY  06/2012  . LAPAROSCOPIC GASTRIC BANDING  08/10/2009    Family History  Problem Relation Age of Onset  . Heart disease Mother   . Diabetes Unknown     SOCIAL HX: see hpi   Current Outpatient Medications:  .  glimepiride (AMARYL) 2 MG tablet, TAKE 2 TABLETS BY MOUTH DAILY WITH BREAKFAST, Disp: 180 tablet, Rfl: 0 .  lisinopril  (PRINIVIL,ZESTRIL) 20 MG tablet, TAKE 1 TABLET BY MOUTH DAILY, Disp: 90 tablet, Rfl: 1 .  metFORMIN (GLUCOPHAGE) 1000 MG tablet, TAKE 1 TABLET BY MOUTH TWICE DAILY WITH MEAL, Disp: 180 tablet, Rfl: 1 .  ONETOUCH DELICA LANCETS FINE MISC, Use as instructed by provider, Disp: 200 each, Rfl: 3 .  ONETOUCH VERIO test strip, CHECK BLOOD SUGAR THREE TIMES DAILY, Disp: 100 each, Rfl: 5 .  rosuvastatin (CRESTOR) 20 MG tablet, Take 1 tablet (20 mg total) by mouth daily., Disp: 90 tablet, Rfl: 3 .  sitaGLIPtin (JANUVIA) 100 MG tablet, Take 1 tablet (100 mg total) by mouth daily., Disp: 30 tablet, Rfl: 3 .  DULoxetine (CYMBALTA) 30 MG capsule, Take 1 capsule (30 mg total) by mouth daily., Disp: 60 capsule, Rfl: 3 .  traMADol (ULTRAM) 50 MG tablet, Take 1 tablet (50 mg total) by mouth every 12 (twelve) hours as needed for up to 7 days., Disp: 14 tablet, Rfl: 0  Current Facility-Administered Medications:  .  0.9 %  sodium chloride infusion, 500 mL, Intravenous, Once, Pyrtle, Lajuan Lines, MD .  0.9 %  sodium chloride infusion, 500 mL, Intravenous, Once, Pyrtle, Lajuan Lines, MD  EXAM:  Vitals:   01/01/18 0839  BP: 100/64  Pulse: 90  Temp: 97.6 F (36.4 C)    Body mass index is 30.08 kg/m.  GENERAL: vitals reviewed and listed above, alert, oriented, appears well hydrated and in no acute distress  HEENT: atraumatic, conjunttiva clear, no obvious  abnormalities on inspection of external nose and ears  NECK: no obvious masses on inspection  LUNGS: clear to auscultation bilaterally, no wheezes, rales or rhonchi, good air movement  CV: HRRR, no peripheral edema  MS: cautious gait, not focal TTP, swelling, rash or erythema, strength equal bilat with coaching, normal sensitivity to light touch, normal ROM head and neck without meningeal signs or spurling, DTRs 2+ and equal bilat upper exterm  PSYCH: pleasant and cooperative, no obvious depression or anxiety  ASSESSMENT AND PLAN:  Discussed the following  assessment and plan:  Chronic right shoulder pain - Plan: DG Cervical Spine Complete  Right arm pain  Polyarthralgia  -we discussed possible serious and likely etiologies, workup and treatment, treatment risks and return precautions - query local capsule or shoulder bursitis or OA R shoulder vs radicular symptoms vs other - though he reports ortho told him there is nothing. I don't have theses notes - will asd assistant to obtain. Also will get plain films neck today as he reports not done. O/w need to defer to rheuma and pain clinic/PMR for further eval/managment. He sees rheum the 21st and wife is going to call for wait list for pain management. -in interim trial Cymbalta for pain after discussion risks/interations. Did let them know that I do not Rx opiods for chronic use due to risks > benefit compared to other options. Wife and pt adamant about tramadol refill. Advised of interactions and he agrees to limit use to 1 tab prn q 12 hours only doing lower dose 1st week of cymbalta.  -follow up advised for diabetes and htn as schedule next month -of course, we advised Yishai  to return or notify a doctor immediately if symptoms worsen or persist or new concerns arise.   Patient Instructions  BEFORE YOU LEAVE: -xray -follow up: as scheduled for diabetes and hypertension  Start the Cymbalta and take 1 tablet (30mg ) daily for 1 week, then increase to 2 tablets daily (60mg ).  Can use 1 tablet of the tramadol every 12 hours AS NEEDED during the 1st week of the tramadol. Do not use more then this.   See the rheumatologist as scheduled.  Call the pain clinic to see if you can get on a wait list.  Do your normal activities as much as possible and eat a healthy low sugar diet.  I hope you are feeling better soon! Seek care promptly if your symptoms worsen, new concerns arise or you are not improving with treatment.      Lucretia Kern, DO

## 2018-01-01 ENCOUNTER — Ambulatory Visit (INDEPENDENT_AMBULATORY_CARE_PROVIDER_SITE_OTHER): Payer: 59

## 2018-01-01 ENCOUNTER — Ambulatory Visit: Payer: Self-pay | Admitting: Family Medicine

## 2018-01-01 ENCOUNTER — Encounter: Payer: Self-pay | Admitting: Family Medicine

## 2018-01-01 ENCOUNTER — Ambulatory Visit: Payer: 59 | Admitting: Family Medicine

## 2018-01-01 VITALS — BP 100/64 | HR 90 | Temp 97.6°F | Ht 68.0 in | Wt 197.8 lb

## 2018-01-01 DIAGNOSIS — M25511 Pain in right shoulder: Secondary | ICD-10-CM | POA: Diagnosis not present

## 2018-01-01 DIAGNOSIS — G8929 Other chronic pain: Secondary | ICD-10-CM

## 2018-01-01 DIAGNOSIS — M2550 Pain in unspecified joint: Secondary | ICD-10-CM

## 2018-01-01 DIAGNOSIS — M79601 Pain in right arm: Secondary | ICD-10-CM

## 2018-01-01 IMAGING — DX DG CERVICAL SPINE COMPLETE 4+V
5 series · 5 of 5 positions shown · non-contrast
Comparison: None.

CLINICAL DATA: Chronic right shoulder and arm pain

EXAM:
CERVICAL SPINE - COMPLETE 4+ VIEW

[cervical spine ap]
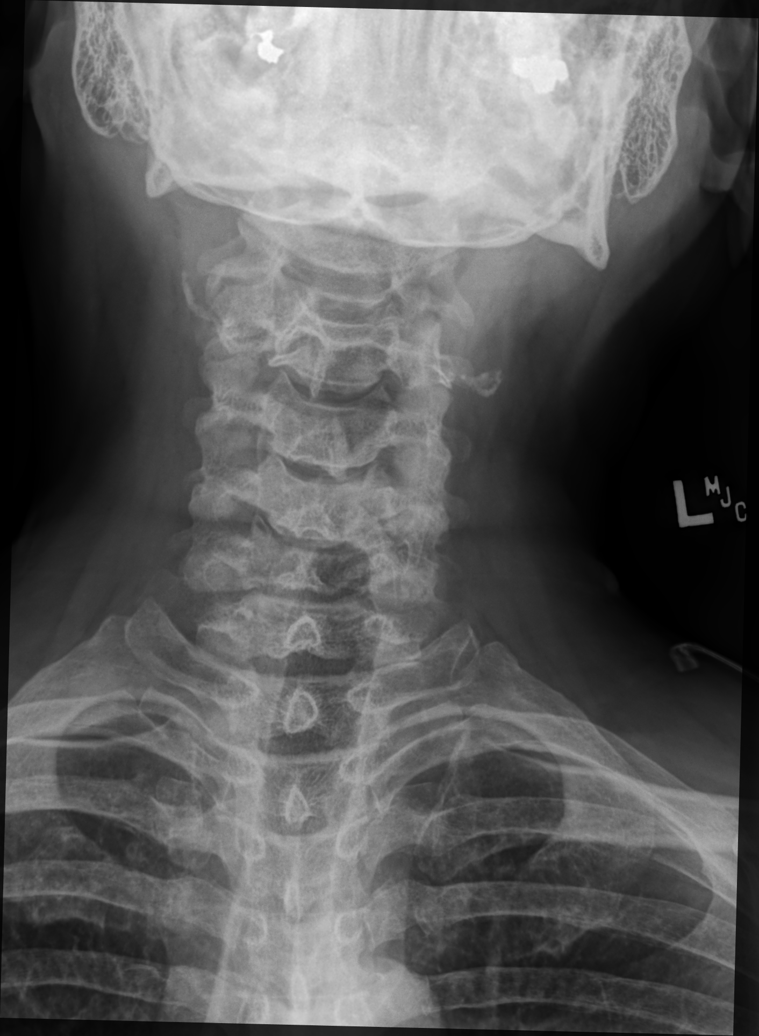

[cervical spine oblique (1 of 2)]
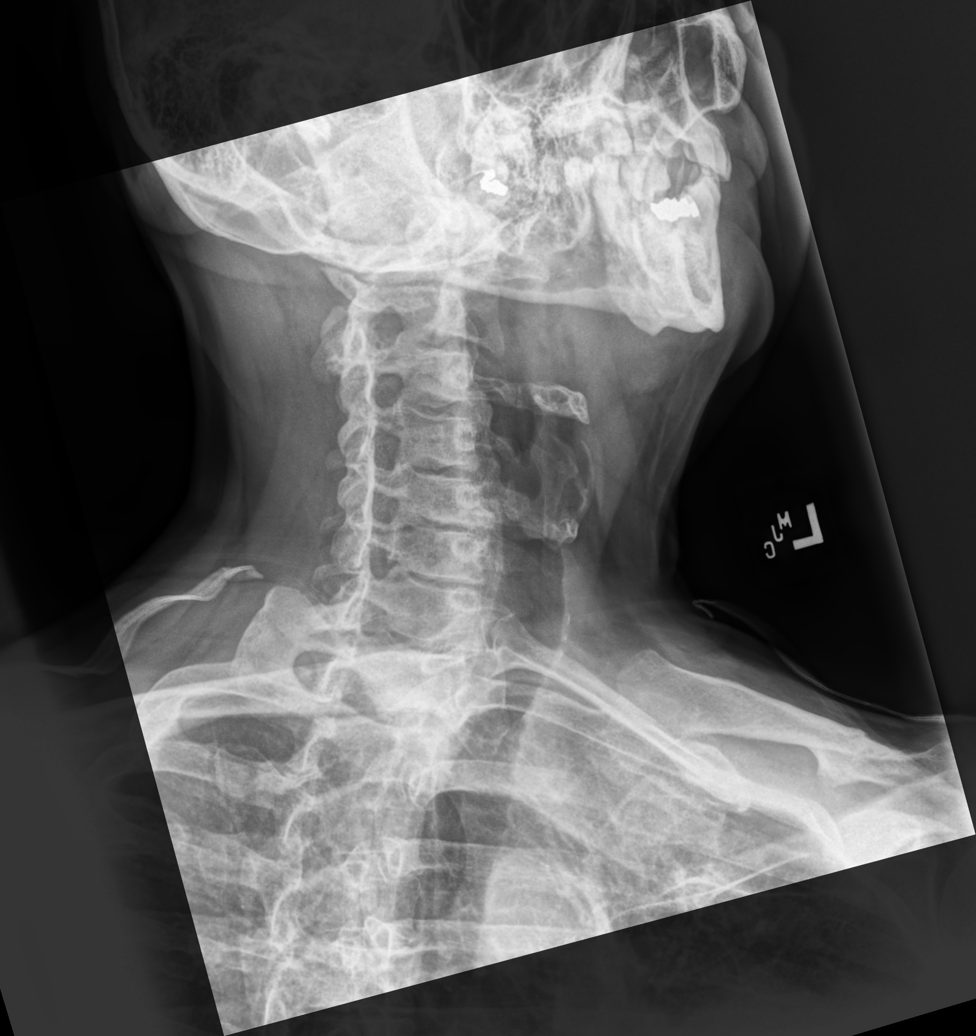

[cervical spine oblique (2 of 2)]
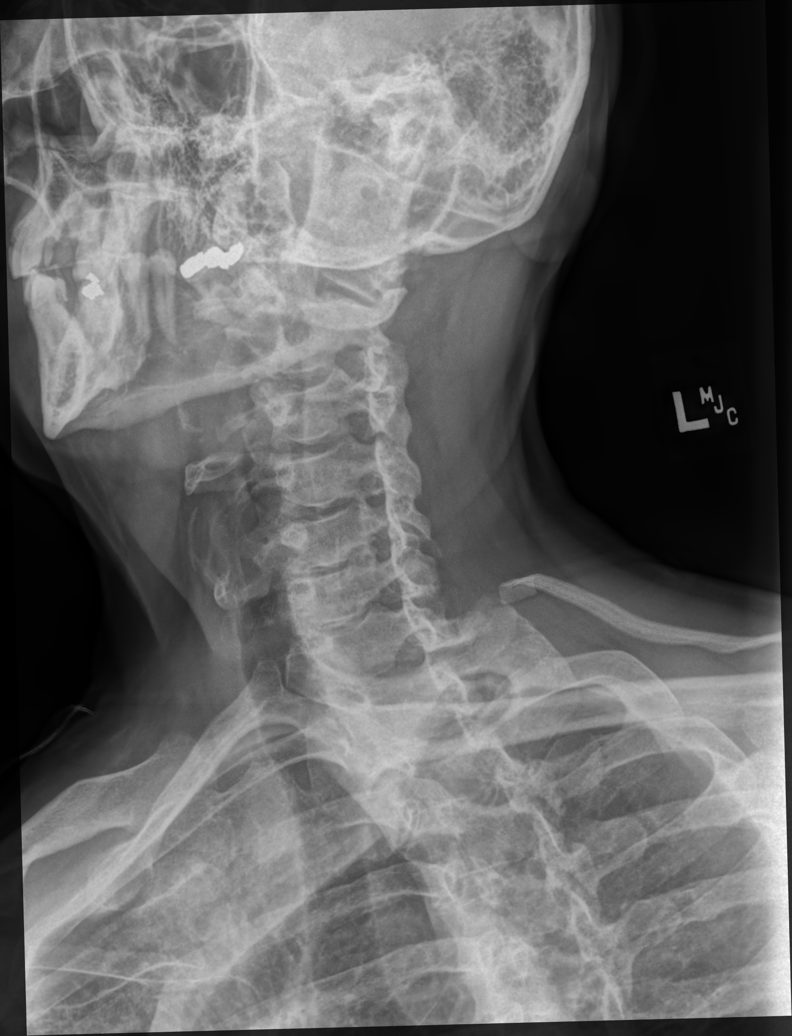

[cervical spine lat]
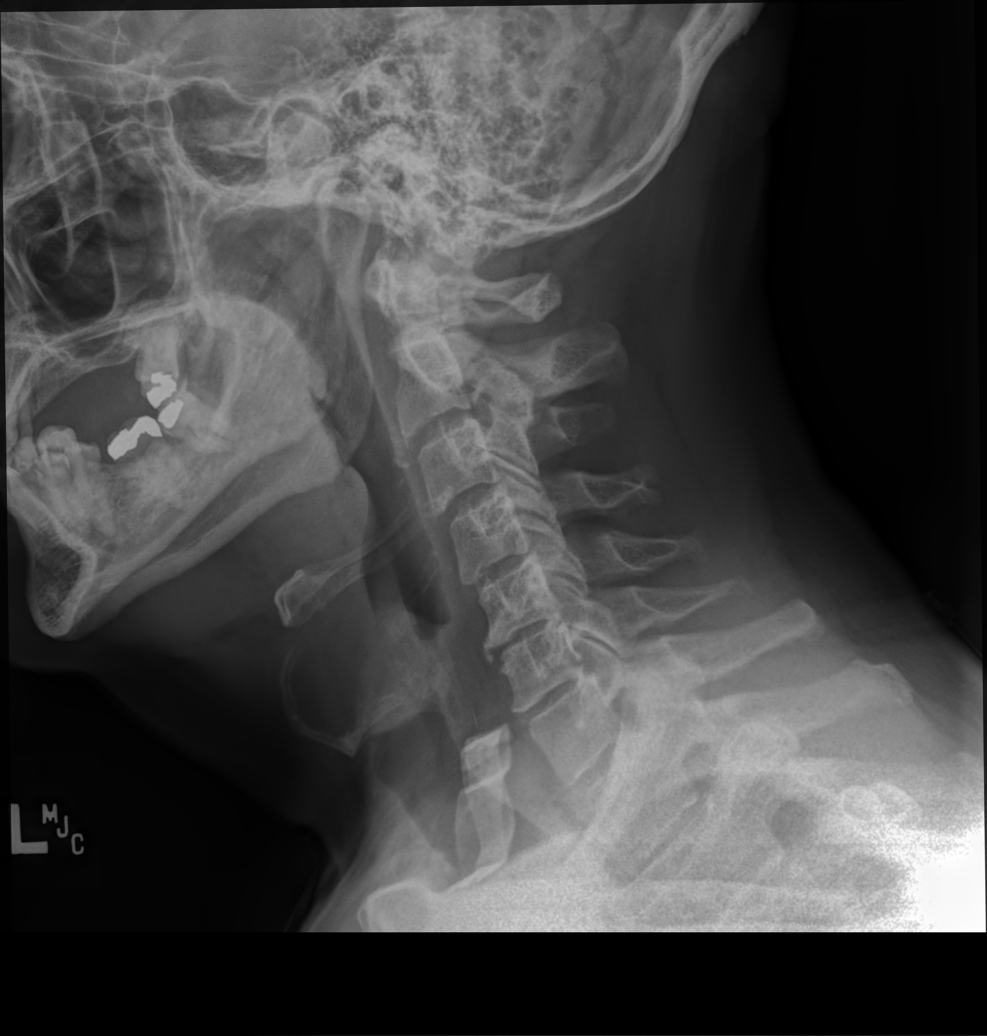

[cervical spine open mouth ap]
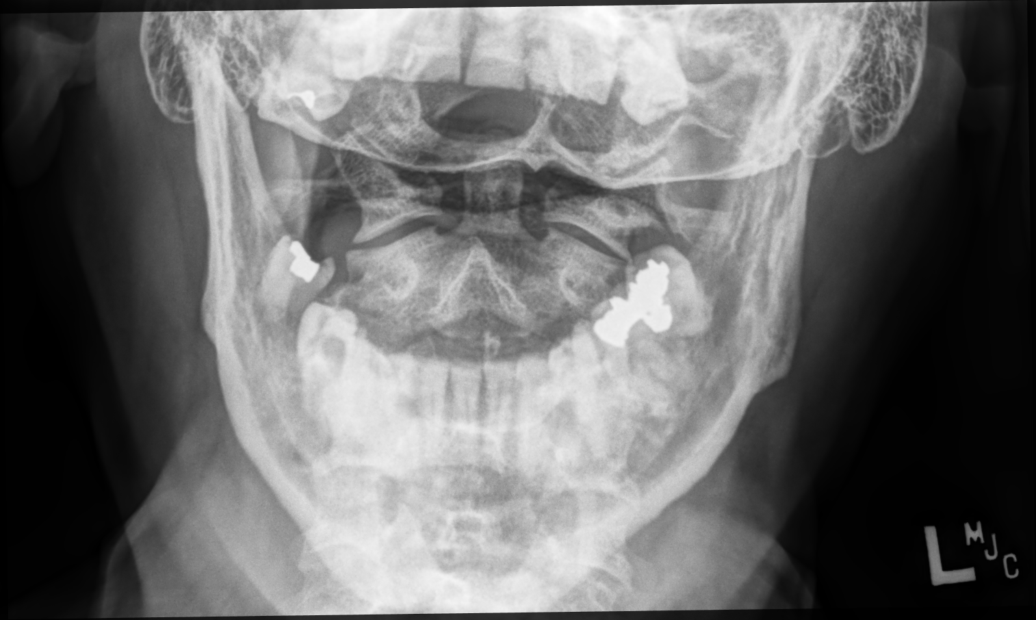

[5 of 5 positions shown; findings below may reference images not displayed]

FINDINGS: Normal cervical alignment. No fracture or mass. Prevertebral soft
tissues normal

Disc degeneration and mild uncinate spurring at C5-6 and C6-7. Mild
right foraminal narrowing C6-7 and mild left foraminal narrowing
C5-6 due to spurring. Mild disc space narrowing at C4-5.
IMPRESSION: Mild cervical disc degeneration and spondylosis C5-6 and C6-7. No
acute abnormality.

## 2018-01-01 MED ORDER — DULOXETINE HCL 30 MG PO CPEP: 30.0000 mg | ORAL_CAPSULE | Freq: Every day | ORAL | 3 refills | Status: DC

## 2018-01-01 MED ORDER — TRAMADOL HCL 50 MG PO TABS: 50.0000 mg | ORAL_TABLET | Freq: Two times a day (BID) | ORAL | 0 refills | Status: AC | PRN

## 2018-01-01 NOTE — Patient Instructions (Addendum)
BEFORE YOU LEAVE: -xray -follow up: as scheduled for diabetes and hypertension  Start the Cymbalta and take 1 tablet (30mg ) daily for 1 week, then increase to 2 tablets daily (60mg ).  Can use 1 tablet of the tramadol every 12 hours AS NEEDED during the 1st week of the Cymbalta. Do not use more then this.   See the rheumatologist as scheduled.  Call the pain clinic to see if you can get on a wait list.  Do your normal activities as much as possible and eat a healthy low sugar diet.  I hope you are feeling better soon! Seek care promptly if your symptoms worsen, new concerns arise or you are not improving with treatment.

## 2018-01-09 ENCOUNTER — Encounter: Payer: Self-pay | Admitting: Physical Medicine & Rehabilitation

## 2018-01-15 ENCOUNTER — Other Ambulatory Visit: Payer: Self-pay | Admitting: Family Medicine

## 2018-01-29 ENCOUNTER — Ambulatory Visit: Payer: Self-pay | Admitting: Family Medicine

## 2018-02-01 ENCOUNTER — Ambulatory Visit: Payer: Self-pay | Admitting: Physical Medicine & Rehabilitation

## 2018-02-07 ENCOUNTER — Other Ambulatory Visit: Payer: Self-pay | Admitting: Family Medicine

## 2018-02-22 ENCOUNTER — Encounter: Payer: Self-pay | Admitting: Endocrinology

## 2018-02-22 ENCOUNTER — Ambulatory Visit (INDEPENDENT_AMBULATORY_CARE_PROVIDER_SITE_OTHER): Payer: 59 | Admitting: Endocrinology

## 2018-02-22 VITALS — BP 136/80 | HR 88 | Ht 70.0 in | Wt 201.0 lb

## 2018-02-22 DIAGNOSIS — E1165 Type 2 diabetes mellitus with hyperglycemia: Secondary | ICD-10-CM

## 2018-02-22 DIAGNOSIS — I10 Essential (primary) hypertension: Secondary | ICD-10-CM | POA: Diagnosis not present

## 2018-02-22 LAB — POCT GLYCOSYLATED HEMOGLOBIN (HGB A1C): Hemoglobin A1C: 9.9 % — AB (ref 4.0–5.6)

## 2018-02-22 LAB — GLUCOSE, POCT (MANUAL RESULT ENTRY): POC Glucose: 268 mg/dl — AB (ref 70–99)

## 2018-02-22 MED ORDER — CANAGLIFLOZIN 100 MG PO TABS: ORAL_TABLET | ORAL | 3 refills | Status: DC

## 2018-02-22 MED ORDER — DULAGLUTIDE 0.75 MG/0.5ML ~~LOC~~ SOAJ: SUBCUTANEOUS | 1 refills | Status: DC

## 2018-02-22 NOTE — Progress Notes (Signed)
Patient ID: Raymond Vasquez, male   DOB: 10-Jan-1951, 67 y.o.   MRN: [id number]          Reason for Appointment: Consultation for Type 2 Diabetes  Referring physician:   History of Present Illness:          Date of diagnosis of type 2 diabetes mellitus: 2003 approximately      Background history:    Recent history:   Most recent A1c is done on 02/22/2018 is 9.9, previously 8.4      Non-insulin hypoglycemic drugs the patient is taking are: Metformin 1 g twice daily, Amaryl 4 mg in a.m., Januvia 100 mg daily  Current management, blood sugar patterns and problems identified:  His medications have not been changed since last year but his blood sugars appear to be progressively high starting with A1c of 7.4 in March  Especially with being on prednisone since about May 2019 he says his blood sugars have been higher; previously were about 120-140 fasting  More recently his blood sugars have been consistently over 200 fasting and was over 300 after lunch today  He checks his blood sugars mostly in the morning  He had lost weight prior to starting treatment for his arthritis because of decreased appetite and is now gaining some back  He is mostly eating 1 meal a day in the evening; he says that he is not eating breakfast and only sometimes eating lunch; today he had a hamburger at a restaurant   Because of his joint pains he has not been able to do any walking or exercise  Currently does not complain of any fatigue, blurred vision, excessive thirst and only sometimes increased urination  He is usually trying to avoid high fat foods, drinking mostly water and Gatorade, only sometimes a regular soft drink        Side effects from medications have been: None    Typical meal intake: Breakfast is skipped               Exercise:  none  Glucose monitoring:  done  times a day         Glucometer: One Touch .       Blood Glucose readings by recall as above   Dietician visit, most  recent: Probably at diagnosis  Weight history:280  Wt Readings from Last 3 Encounters:  02/22/18 201 lb (91.2 kg)  01/01/18 197 lb 12.8 oz (89.7 kg)  12/14/17 196 lb 14.4 oz (89.3 kg)    Glycemic control:   Lab Results  Component Value Date   HGBA1C 9.9 (A) 02/22/2018   HGBA1C 8.4 (H) 11/13/2017   HGBA1C 7.4 (H) 08/10/2017   Lab Results  Component Value Date   MICROALBUR 0.5 03/21/2014   LDLCALC 61 11/13/2017   CREATININE 0.86 11/13/2017   Lab Results  Component Value Date   MICRALBCREAT 0.6 03/21/2014    No results found for: FRUCTOSAMINE  Office Visit on 02/22/2018  Component Date Value Ref Range Status  . Hemoglobin A1C 02/22/2018 9.9* 4.0 - 5.6 % Final  . POC Glucose 02/22/2018 268* 70 - 99 mg/dl Final    Allergies as of 02/22/2018   No Known Allergies     Medication List        Accurate as of 02/22/18 11:59 PM. Always use your most recent med list.          canagliflozin 100 MG Tabs tablet Commonly known as:  INVOKANA 1 tablet before breakfast   Dulaglutide  0.75 MG/0.5ML Sopn Inject in the abdominal skin as directed once a week   glimepiride 2 MG tablet Commonly known as:  AMARYL TAKE 2 TABLETS BY MOUTH DAILY WITH BREAKFAST   JANUVIA 100 MG tablet Generic drug:  sitaGLIPtin TAKE 1 TABLET(100 MG) BY MOUTH DAILY   lisinopril 20 MG tablet Commonly known as:  PRINIVIL,ZESTRIL TAKE 1 TABLET BY MOUTH EVERY DAY   metFORMIN 1000 MG tablet Commonly known as:  GLUCOPHAGE TAKE 1 TABLET BY MOUTH TWICE DAILY WITH MEAL   ONETOUCH DELICA LANCETS FINE Misc Use as instructed by provider   ONETOUCH VERIO test strip Generic drug:  glucose blood CHECK BLOOD SUGAR THREE TIMES DAILY   predniSONE 5 MG tablet Commonly known as:  DELTASONE TK 2 TO 3 TS PO QD   rosuvastatin 20 MG tablet Commonly known as:  CRESTOR Take 1 tablet (20 mg total) by mouth daily.       Allergies: No Known Allergies  Past Medical History:  Diagnosis Date  . Angioedema      he is unsure of this or cause  . 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 Diabetes mellitus type II, controlled, with no complications ([place])   . Hemorrhoids   . 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 Hyperlipidemia   . Hypertension   . Nephrolithiasis    1980s  . OSA (obstructive sleep apnea)   . PMR (polymyalgia rheumatica) ([facility])   . Shingles    2014    Past Surgical History:  Procedure Laterality Date  . APPENDECTOMY    . FINGER SURGERY  06/2012  . LAPAROSCOPIC GASTRIC BANDING  08/10/2009    Family History  Problem Relation Age of Onset  . Heart disease Mother   . Diabetes Maternal Grandmother     Social History:  reports that he has been smoking cigars. His smokeless tobacco use includes chew. He reports that he does not drink alcohol or use drugs.   Review of Systems  Constitutional: Negative for weight loss and reduced appetite.  HENT: Negative for headaches.   Eyes: Negative for blurred vision.  Respiratory: Negative for shortness of breath.   Cardiovascular: Negative for leg swelling.  Gastrointestinal: Negative for diarrhea.  Endocrine: Negative for fatigue.  Genitourinary: Positive for frequency.  Musculoskeletal: Positive for joint pain.       Currently being treated for seronegative rheumatoid arthritis  Skin: Negative for rash.  Neurological: Positive for numbness.       Numbness in the fingers on the right, none on both feet.  No sharp pains in lower legs  Psychiatric/Behavioral: Negative for insomnia.     Lipid history: Treated by PCP with Crestor 20 mg daily    Lab Results  Component Value Date   CHOL 129 11/13/2017   HDL 41.80 11/13/2017   LDLCALC 61 11/13/2017   LDLDIRECT 154.1 02/16/2010   TRIG 130.0 11/13/2017   CHOLHDL 3 11/13/2017           Hypertension: Has been present, currently on lisinopril 20 mg and followed by PCP  BP Readings from Last 3 Encounters:  02/22/18 136/80  01/01/18 100/64  12/14/17 120/80     Most recent eye exam was in 2017  Most recent foot exam: 10/19  Currently known complications of diabetes: None, needs urine microalbumin checked and follow-up eye exam  LABS:  Office Visit on 02/22/2018  Component Date Value Ref Range Status  . Hemoglobin A1C 02/22/2018 9.9* 4.0 - 5.6 % Final  . POC Glucose 02/22/2018  268* 70 - 99 mg/dl Final    Physical Examination:  BP 136/80   Pulse 88   Ht 5\' 10"  (1.778 m)   Wt 201 lb (91.2 kg)   SpO2 97%   BMI 28.84 kg/m   GENERAL:         Patient has mild generalized obesity.    HEENT:         Eye exam shows normal external appearance.  Fundus exam shows no retinopathy.  Oral exam shows normal mucosa .   NECK:   There is no lymphadenopathy  Thyroid is not enlarged and no nodules felt.   Carotids are normal to palpation and no bruit heard  LUNGS:         Chest is symmetrical. Lungs are clear to auscultation.Marland Kitchen   HEART:         Heart sounds:  S1 and S2 are normal. No murmur or click heard., no S3 or S4.   ABDOMEN:   There is no distention present. Liver and spleen are not palpable.  No other mass or tenderness present.    NEUROLOGICAL:   Ankle jerks are absent bilaterally.    Diabetic Foot Exam - Simple   Simple Foot Form Diabetic Foot exam was performed with the following findings:  Yes   Visual Inspection No deformities, no ulcerations, no other skin breakdown bilaterally:  Yes Sensation Testing Intact to touch and monofilament testing bilaterally:  Yes Pulse Check Posterior Tibialis and Dorsalis pulse intact bilaterally:  Yes Comments            Vibration sense is mildly reduced in distal first toes.  MUSCULOSKELETAL:  There is no swelling or deformity of the peripheral joints.     EXTREMITIES:     There is no ankle edema.  SKIN:       No rash or lesions of concern.        ASSESSMENT:  Diabetes type 2 with BMI 29  See history of present illness for detailed discussion of current diabetes management, blood  sugar patterns and problems identified  Recent A1c of 9.9 indicates poor control which is worsening this year Most of his hyperglycemia recently is related to use of prednisone even though right now he is on 15 mg only  Current treatment regimen is metformin, Amaryl and Januvia which has not been changed over the last year Although he is likely insulin deficient at this time and with using prednisone may well need insulin he has blood sugars mostly in the 200+ range and not symptomatic  Complications of diabetes: None evident  Essential hypertension and hypercholesterolemia: Well controlled.  Does tend to have low normal HDL and normal triglycerides  Seronegative rheumatoid arthritis followed by rheumatologist, currently starting methotrexate  PLAN:    1. Glucose monitoring: We will need to start bringing his monitor for review on his visit . Patient advised to check readings either fasting or 2 hours after meals regularly and not just in the morning . Discussed blood sugar targets both morning and after meals  2.  Diabetes education: . Patient will need some dietary education and will review needs on the next visit  3.  Lifestyle changes: . Dietary changes: Cut back on fast food and any sweet drinks . Exercise regimen: Currently not able to do much but encouraged him to more active and able to  4.  Medication changes needed: . He will need to add more effective medications and will start him on both an SGLT2 drug  and GLP-1 drug Discussed action of SGLT 2 drugs on lowering glucose by decreasing kidney absorption of glucose, benefits of weight loss and lower blood pressure, possible side effects including candidiasis and dosage regimen  . He will start with 100 mg INVOKANA daily . While starting this increase his fluid intake and also monitor his blood pressure regularly, consider reducing lisinopril if blood pressure low normal . Will need follow-up renal function in about a  month Discussed with the patient the nature of GLP-1 drugs, the action on various organ systems, improved satiety, how they benefit blood glucose control, as well as the benefit of weight loss. Explained possible side effects of TRULICITY, most commonly nausea that usually improves over time; discussed safety information in package insert.  Demonstrated the medication injection device and injection technique to the patient.  Showed patient possible injection sites To start with 0.75 mg dosage weekly for the first 4 injections and then reassess dose increase At this time he will need to start Januvia Patient brochure on Trulicity with enclosed co-pay card given . Cautioned him about mild hypoglycemia when blood sugars progressively improved and may need to reduce Amaryl at that time, this may be more relevant when he is off prednisone   5.  Preventive care needed:  . Eye exams once his blood sugars are improved and urine microalbumin also  6.  Follow-up: 1 month if his blood sugars are not significantly better and 2 weeks he will call back in we will start insulin    Patient Instructions  Please note that if your blood sugars are not coming down below 200 within 2 weeks please call  Check blood sugars on waking up 4 days a week  Also check blood sugars about 2 hours after meals and do this after different meals by rotation  Recommended blood sugar levels on waking up are 90-130 and about 2 hours after meal is 130-160  Please bring your blood sugar monitor to each visit, thank you  Start TRULICITYwith the pen as shown once weekly on the same day of the week.  You may inject in the stomach, thigh or arm as indicated in the brochure given.  You will feel fullness of the stomach with starting the medication and should try to keep the portions at meals small.  You may experience nausea in the first few days which usually gets better over time   If any questions or concerns are present call  the office or the  [place] at [phone]. Also visit [email or web address] website for more useful information  Stop Januvia, continue metformin and glimepiride unchanged for now  Take [facility] in the morning Keep up your fluid intake  If stopping prednisone and blood sugars are starting to get below 100 we may need to reduce glimepiride     Consultation note has been sent to the referring physician  Counseling time on subjects discussed in assessment and plan sections is over 50% of today's 60 minute visit   Elayne Snare 02/23/2018, 7:57 AM   Note: This office note was prepared with Dragon voice recognition system technology. Any transcriptional errors that result from this process are unintentional.

## 2018-02-22 NOTE — Patient Instructions (Addendum)
Please note that if your blood sugars are not coming down below 200 within 2 weeks please call  Check blood sugars on waking up 4 days a week  Also check blood sugars about 2 hours after meals and do this after different meals by rotation  Recommended blood sugar levels on waking up are 90-130 and about 2 hours after meal is 130-160  Please bring your blood sugar monitor to each visit, thank you  Start TRULICITYwith the pen as shown once weekly on the same day of the week.  You may inject in the stomach, thigh or arm as indicated in the brochure given.  You will feel fullness of the stomach with starting the medication and should try to keep the portions at meals small.  You may experience nausea in the first few days which usually gets better over time   If any questions or concerns are present call the office or the  [place] at [phone]. Also visit [email or web address] website for more useful information  Stop Januvia, continue metformin and glimepiride unchanged for now  Take [facility] in the morning Keep up your fluid intake  If stopping prednisone and blood sugars are starting to get below 100 we may need to reduce glimepiride

## 2018-02-23 ENCOUNTER — Telehealth: Payer: Self-pay | Admitting: Endocrinology

## 2018-02-23 ENCOUNTER — Telehealth: Payer: Self-pay | Admitting: Family Medicine

## 2018-02-23 NOTE — Telephone Encounter (Signed)
Copied from [place] [id number]. Topic: General - Other >> Feb 23, 2018 10:22 AM Marin Olp L wrote: Reason for CRM: Patient wants to know if he can come to the office to do bp checks 2x per week since they started him on 2 different medications? Is trying to avoid buying his own bp meter.

## 2018-02-23 NOTE — Telephone Encounter (Signed)
LMTCB

## 2018-02-23 NOTE — Telephone Encounter (Signed)
Please ask him if he is able to check his blood pressure, needs to do this twice a week.  With starting Invokana if his blood pressure is below 110 he will need to cut his lisinopril in half

## 2018-02-26 NOTE — Telephone Encounter (Signed)
I left a message for the pt to return my call.  CRM also created. 

## 2018-02-26 NOTE — Telephone Encounter (Signed)
we no longer do medical assistant blood pressure checks.  He can schedule an appointment here with me for a blood pressure check if he wishes. Would not need this twice a week.  Having a home cuff is a good way for checking on a more regular basis.

## 2018-03-22 ENCOUNTER — Other Ambulatory Visit (INDEPENDENT_AMBULATORY_CARE_PROVIDER_SITE_OTHER): Payer: 59

## 2018-03-22 ENCOUNTER — Other Ambulatory Visit: Payer: Self-pay | Admitting: Internal Medicine

## 2018-03-22 DIAGNOSIS — M069 Rheumatoid arthritis, unspecified: Secondary | ICD-10-CM

## 2018-03-22 DIAGNOSIS — E1165 Type 2 diabetes mellitus with hyperglycemia: Secondary | ICD-10-CM | POA: Diagnosis not present

## 2018-03-22 LAB — COMPREHENSIVE METABOLIC PANEL
ALBUMIN: 4.3 g/dL (ref 3.5–5.2)
ALT: 19 U/L (ref 0–53)
AST: 14 U/L (ref 0–37)
Alkaline Phosphatase: 35 U/L — ABNORMAL LOW (ref 39–117)
BILIRUBIN TOTAL: 0.3 mg/dL (ref 0.2–1.2)
BUN: 15 mg/dL (ref 6–23)
CO2: 29 mEq/L (ref 19–32)
Calcium: 9.8 mg/dL (ref 8.4–10.5)
Chloride: 103 mEq/L (ref 96–112)
Creatinine, Ser: 0.95 mg/dL (ref 0.40–1.50)
GFR: 84.56 mL/min (ref >60.00)
GLUCOSE: 268 mg/dL — AB (ref 70–99)
POTASSIUM: 4.3 meq/L (ref 3.5–5.1)
Sodium: 140 mEq/L (ref 135–145)
Total Protein: 6.4 g/dL (ref 6.0–8.3)

## 2018-03-22 LAB — MICROALBUMIN / CREATININE URINE RATIO
Creatinine,U: 27.4 mg/dL
MICROALB/CREAT RATIO: 2.6 mg/g (ref 0.0–30.0)

## 2018-03-22 NOTE — Progress Notes (Signed)
Orders entered

## 2018-03-23 ENCOUNTER — Encounter: Payer: Self-pay | Admitting: Internal Medicine

## 2018-03-23 LAB — CBC WITH DIFFERENTIAL/PLATELET
Basophils Absolute: 0.1 10*3/uL (ref 0.0–0.2)
Basos: 1 %
EOS (ABSOLUTE): 0.1 10*3/uL (ref 0.0–0.4)
EOS: 1 %
HEMOGLOBIN: 12.7 g/dL — AB (ref 13.0–17.7)
Hematocrit: 40.1 % (ref 37.5–51.0)
Immature Grans (Abs): 0.1 10*3/uL (ref 0.0–0.1)
Immature Granulocytes: 1 %
LYMPHS ABS: 0.8 10*3/uL (ref 0.7–3.1)
Lymphs: 10 %
MCH: 27.8 pg (ref 26.6–33.0)
MCHC: 31.7 g/dL (ref 31.5–35.7)
MCV: 88 fL (ref 79–97)
MONOS ABS: 0.5 10*3/uL (ref 0.1–0.9)
Monocytes: 6 %
Neutrophils Absolute: 6.3 10*3/uL (ref 1.4–7.0)
Neutrophils: 81 %
Platelets: 326 10*3/uL (ref 150–450)
RBC: 4.57 x10E6/uL (ref 4.14–5.80)
RDW: 15.3 % (ref 12.3–15.4)
WBC: 7.8 10*3/uL (ref 3.4–10.8)

## 2018-03-23 LAB — FRUCTOSAMINE: Fructosamine: 281 umol/L (ref 0–285)

## 2018-03-23 LAB — C-REACTIVE PROTEIN: CRP: 4 mg/L (ref 0–10)

## 2018-03-25 NOTE — Progress Notes (Deleted)
Name: Raymond Vasquez  Age/ Sex: 67 y.o., male   MRN/ DOB: [id number], Jun 09, 1950     PCP: Lucretia Kern, DO   Reason for Endocrinology Evaluation: Type 2 Diabetes Mellitus  Initial Endocrine Consultative Visit: 02/22/18    PATIENT IDENTIFIER: Raymond Vasquez is a 67 y.o. male with a past medical history of T2DM, RA, S/P gastric banding in 2015 and HTN. The patient has followed with Endocrinology clinic since 10/3/219 for consultative assistance with management of his diabetes.  DIABETIC HISTORY:  Raymond Vasquez was diagnosed with DM in 2003, and has been on variable oral glycemic agents since his diagnosis. His hemoglobin A1c has ranged from 6.8% in 208, peaking at 9.9% in 2019.   SUBJECTIVE:   During the last visit 02/22/18): A1c was 9.9% . Metformin and Glimepiride were continued. He was started on Invokana and trulicity , Januvia was stopped.   Today (03/25/2018): Raymond Vasquez is here for a 1 months follow up on his diabetes management. He is on prednisone therapy for RA.  He checks his blood sugars *** times daily, preprandial to breakfast and ***. The patient has *** had hypoglycemic episodes since the last clinic visit, which typically occur *** x / - most often occuring ***. The patient is *** symptomatic with these episodes, with symptoms of {symptoms; hypoglycemia:9084048}. Otherwise, the patient {HAS/HAS NOT:522402} required any recent emergency interventions for hypoglycemia and {HAS/HAS NOT:522402} had recent hospitalizations secondary to hyper or hypoglycemic episodes.    ROS: As per HPI and as detailed below: ROS    HOME DIABETES REGIMEN:    Statin: *** ACE-I/ARB: *** Prior Diabetic Education: ***   METER DOWNLOAD SUMMARY: Date range evaluated: *** Fingerstick Blood Glucose Tests = *** Average Number Tests/Day = *** Overall Mean FS Glucose = *** Standard Deviation = ***  BG Ranges: Low  = *** High = ***   Hypoglycemic Events/30 Days: BG < 50 = *** Episodes of symptomatic severe hypoglycemia = ***    DIABETIC COMPLICATIONS: Microvascular complications:   ***  Denies:   Last Eye Exam: Completed   Macrovascular complications:   ***  Denies: CAD, CVA, PVD   HISTORY:  Past Medical History:  Past Medical History:  Diagnosis Date  . Angioedema    he is unsure of this or cause  . 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 Diabetes mellitus type II, controlled, with no complications ([facility])   . Hemorrhoids   . 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 Hyperlipidemia   . Hypertension   . Nephrolithiasis    1980s  . OSA (obstructive sleep apnea)   . PMR (polymyalgia rheumatica) ([facility])   . Shingles    2014    Past Surgical History:  Past Surgical History:  Procedure Laterality Date  . APPENDECTOMY    . FINGER SURGERY  06/2012  . LAPAROSCOPIC GASTRIC BANDING  08/10/2009     Social History:  reports that he has been smoking cigars. His smokeless tobacco use includes chew. He reports that he does not drink alcohol or use drugs. Family History:  Family History  Problem Relation Age of Onset  . Heart disease Mother   . Diabetes Maternal Grandmother       HOME MEDICATIONS: Allergies as of 03/26/2018   No Known Allergies     Medication List        Accurate as of 03/25/18  8:54 PM. Always use your most recent med list.  canagliflozin 100 MG Tabs tablet Commonly known as:  INVOKANA 1 tablet before breakfast   Dulaglutide 0.75 MG/0.5ML Sopn Inject in the abdominal skin as directed once a week   glimepiride 2 MG tablet Commonly known as:  AMARYL TAKE 2 TABLETS BY MOUTH DAILY WITH BREAKFAST   JANUVIA 100 MG tablet Generic drug:  sitaGLIPtin TAKE 1 TABLET(100 MG) BY MOUTH DAILY   lisinopril 20 MG tablet Commonly known as:  PRINIVIL,ZESTRIL TAKE 1 TABLET BY MOUTH EVERY DAY   metFORMIN 1000 MG tablet Commonly  known as:  GLUCOPHAGE TAKE 1 TABLET BY MOUTH TWICE DAILY WITH MEAL   ONETOUCH DELICA LANCETS FINE Misc Use as instructed by provider   ONETOUCH VERIO test strip Generic drug:  glucose blood CHECK BLOOD SUGAR THREE TIMES DAILY   predniSONE 5 MG tablet Commonly known as:  DELTASONE TK 2 TO 3 TS PO QD   rosuvastatin 20 MG tablet Commonly known as:  CRESTOR Take 1 tablet (20 mg total) by mouth daily.        OBJECTIVE:   Vital Signs: There were no vitals taken for this visit.  Wt Readings from Last 3 Encounters:  02/22/18 201 lb (91.2 kg)  01/01/18 197 lb 12.8 oz (89.7 kg)  12/14/17 196 lb 14.4 oz (89.3 kg)     Exam: General: Pt appears well and is in NAD  Hydration: Well-hydrated with moist mucous membranes and good skin turgor  HEENT: Head: Unremarkable with good dentition. Oropharynx clear without exudate.  Eyes: External eye exam normal without stare, lid lag or exophthalmos.  EOM intact.  PERRL.  Neck: General: Supple without adenopathy. Thyroid: Thyroid size normal.  No goiter or nodules appreciated. No thyroid bruit.  Lungs: Clear with good BS bilat with no rales, rhonchi, or wheezes  Heart: RRR with normal S1 and S2 and no gallops; no murmurs; no rub  Abdomen: Normoactive bowel sounds, soft, nontender, without masses or organomegaly palpable  Extremities: No pretibial edema. No tremor. Normal strength and motion throughout. See detailed diabetic foot exam below.  Skin: Normal texture and temperature to palpation. No rash noted. No Acanthosis nigricans/skin tags. No lipohypertrophy.  Neuro: MS is good with appropriate affect, pt is alert and Ox3     DATA REVIEWED:  Lab Results  Component Value Date   HGBA1C 9.9 (A) 02/22/2018   HGBA1C 8.4 (H) 11/13/2017   HGBA1C 7.4 (H) 08/10/2017   Lab Results  Component Value Date   MICROALBUR <0.7 03/22/2018   LDLCALC 61 11/13/2017   CREATININE 0.95 03/22/2018   Lab Results  Component Value Date   MICRALBCREAT 2.6  03/22/2018    Lab Results  Component Value Date   FRUCTOSAMINE 281 03/22/2018     Lab Results  Component Value Date   CHOL 129 11/13/2017   HDL 41.80 11/13/2017   LDLCALC 61 11/13/2017   LDLDIRECT 154.1 02/16/2010   TRIG 130.0 11/13/2017   CHOLHDL 3 11/13/2017         ASSESSMENT / PLAN / RECOMMENDATIONS:   1) Type {NUMBERS 1 OR 2:522190} Diabetes Mellitus, ***controlled, With *** complications - Most recent A1c of *** %. Goal A1c < *** %.  ***  Plan: MEDICATIONS:  ***  EDUCATION / INSTRUCTIONS:  BG monitoring instructions: Patient is instructed to check his blood sugars *** times a day, ***.  Call [facility] if: BG persistently < 70 or > 300. . I reviewed the Rule of 15 for the treatment of hypoglycemia in detail with the patient. Literature  supplied.  REFERRALS:  ***.   2) Diabetic complications:   Eye: Does *** have known diabetic retinopathy. Last eye exam was *** 1 year ago.   Neuro/ Feet: Does *** have known diabetic peripheral neuropathy .   Renal: Patient does *** have known baseline CKD. Last SCr on @LABRSLTLINEBRIEFNR (Creatinine)@. Last microalb/cr ***. He   is ***on an ACEI/ARB at present.   3) Lipids: Patient is *** on a statin.  4) Hypertension: *** at goal of < 140/90 mmHg.    F/U in ***    Signed electronically by: Mack Guise, MD  [facility]  [facility] [address] [place], [place] [address] Phone: [phone] FAX: [phone]   CC: Lucretia Kern, DO [address] [place] [address] Phone: [phone]  Fax: [phone]  Return to Endocrinology clinic as below: Future Appointments  Date Time Provider [place]  03/26/2018  7:30 AM Shamleffer, Melanie Crazier, MD [facility] None  04/27/2018  9:45 AM [facility] LAB [facility] None  05/01/2018 11:00 AM Elayne Snare, MD [facility] None

## 2018-03-26 ENCOUNTER — Institutional Professional Consult (permissible substitution): Payer: Self-pay | Admitting: Internal Medicine

## 2018-03-30 ENCOUNTER — Other Ambulatory Visit: Payer: Self-pay | Admitting: Family Medicine

## 2018-04-15 ENCOUNTER — Other Ambulatory Visit: Payer: Self-pay | Admitting: Endocrinology

## 2018-04-27 ENCOUNTER — Other Ambulatory Visit (INDEPENDENT_AMBULATORY_CARE_PROVIDER_SITE_OTHER): Payer: Medicare Other

## 2018-04-27 DIAGNOSIS — E1165 Type 2 diabetes mellitus with hyperglycemia: Secondary | ICD-10-CM | POA: Diagnosis not present

## 2018-04-27 LAB — BASIC METABOLIC PANEL
BUN: 15 mg/dL (ref 6–23)
CHLORIDE: 102 meq/L (ref 96–112)
CO2: 31 mEq/L (ref 19–32)
Calcium: 10.2 mg/dL (ref 8.4–10.5)
Creatinine, Ser: 0.94 mg/dL (ref 0.40–1.50)
GFR: 85.58 mL/min (ref >60.00)
GLUCOSE: 176 mg/dL — AB (ref 70–99)
POTASSIUM: 4.8 meq/L (ref 3.5–5.1)
SODIUM: 141 meq/L (ref 135–145)

## 2018-04-30 NOTE — Progress Notes (Signed)
Patient ID: Raymond Vasquez, male   DOB: 06/16/1950, 67 y.o.   MRN: [id number]          Reason for Appointment: Follow-up for Type 2 Diabetes   History of Present Illness:          Date of diagnosis of type 2 diabetes mellitus: 2003 approximately       Recent history:   Most recent A1c is done on 02/22/2018 is 9.9, previously 8.4      Non-insulin hypoglycemic drugs the patient is taking are: Metformin 1 g twice daily, Amaryl 4 mg in a.m., Trulicity 4.65 mg weekly, Invokana 100 mg daily  Current management, blood sugar patterns and problems identified:  His medications were changed on his last visit October but he did not follow-up in 1 month as directed  Previously on Januvia and this was changed Trulicity and Invokana was added  He has had no side effects from either of those  Although he was told to check readings consistently at different times and bring his meter he is slowly checking readings in the morning  His blood sugars do appear to be overall better but difficult to assess with no objective labs done  Also his prednisone dose has been reduced from 15 down to 10 mg since his last visit  He still has relatively high fasting readings although not clear how consistent  Today after the breakfast when he had juice and a muffin his blood sugar is over 200  He still has not started any exercise program despite discussion on the last visit  Does not have any meals plan to follow       Side effects from medications have been: None    Typical meal intake: Breakfast is skipped, sometimes juice and muffin               Exercise:  none  Glucose monitoring:  done 1 times a day         Glucometer: One Touch .       Blood Glucose readings by recall   Fasting range: 130-190   Dietician visit, most recent: Before his lap band surgery  Weight history:280  Wt Readings from Last 3 Encounters:  05/01/18 204 lb 6.4 oz (92.7 kg)  02/22/18 201 lb (91.2 kg)  01/01/18 197 lb  12.8 oz (89.7 kg)    Glycemic control:   Lab Results  Component Value Date   HGBA1C 9.9 (A) 02/22/2018   HGBA1C 8.4 (H) 11/13/2017   HGBA1C 7.4 (H) 08/10/2017   Lab Results  Component Value Date   MICROALBUR <0.7 03/22/2018   LDLCALC 61 11/13/2017   CREATININE 0.94 04/27/2018   Lab Results  Component Value Date   MICRALBCREAT 2.6 03/22/2018    Lab Results  Component Value Date   FRUCTOSAMINE 281 03/22/2018    Lab on 04/27/2018  Component Date Value Ref Range Status  . Sodium 04/27/2018 141  135 - 145 mEq/L Final  . Potassium 04/27/2018 4.8  3.5 - 5.1 mEq/L Final  . Chloride 04/27/2018 102  96 - 112 mEq/L Final  . CO2 04/27/2018 31  19 - 32 mEq/L Final  . Glucose, Bld 04/27/2018 176* 70 - 99 mg/dL Final  . BUN 04/27/2018 15  6 - 23 mg/dL Final  . Creatinine, Ser 04/27/2018 0.94  0.40 - 1.50 mg/dL Final  . Calcium 04/27/2018 10.2  8.4 - 10.5 mg/dL Final  . GFR 04/27/2018 85.58  >60.00 mL/min Final    Allergies as  of 05/01/2018   No Known Allergies     Medication List        Accurate as of 05/01/18  1:20 PM. Always use your most recent med list.          canagliflozin 100 MG Tabs tablet Commonly known as:  INVOKANA 1 tablet before breakfast   Dulaglutide 1.5 MG/0.5ML Sopn Inject weekly   glimepiride 2 MG tablet Commonly known as:  AMARYL TAKE 2 TABLETS BY MOUTH DAILY WITH BREAKFAST   lisinopril 20 MG tablet Commonly known as:  PRINIVIL,ZESTRIL TAKE 1 TABLET BY MOUTH EVERY DAY   metFORMIN 1000 MG tablet Commonly known as:  GLUCOPHAGE TAKE 1 TABLET BY MOUTH TWICE DAILY WITH MEAL   ONETOUCH DELICA LANCETS FINE Misc Use as instructed by provider   ONETOUCH VERIO test strip Generic drug:  glucose blood CHECK BLOOD SUGAR THREE TIMES DAILY   predniSONE 5 MG tablet Commonly known as:  DELTASONE TK 2 TO 3 TS PO QD   rosuvastatin 20 MG tablet Commonly known as:  CRESTOR Take 1 tablet (20 mg total) by mouth daily.       Allergies: No Known  Allergies  Past Medical History:  Diagnosis Date  . Angioedema    he is unsure of this or cause  . 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 Diabetes mellitus type II, controlled, with no complications ([place])   . Hemorrhoids   . 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 Hyperlipidemia   . Hypertension   . Nephrolithiasis    1980s  . OSA (obstructive sleep apnea)   . PMR (polymyalgia rheumatica) ([facility])   . Shingles    2014    Past Surgical History:  Procedure Laterality Date  . APPENDECTOMY    . FINGER SURGERY  06/2012  . LAPAROSCOPIC GASTRIC BANDING  08/10/2009    Family History  Problem Relation Age of Onset  . Heart disease Mother   . Diabetes Maternal Grandmother     Social History:  reports that he has been smoking cigars. His smokeless tobacco use includes chew. He reports that he does not drink alcohol or use drugs.   Review of Systems   Lipid history: Treated by PCP with Crestor 20 mg daily    Lab Results  Component Value Date   CHOL 129 11/13/2017   HDL 41.80 11/13/2017   LDLCALC 61 11/13/2017   LDLDIRECT 154.1 02/16/2010   TRIG 130.0 11/13/2017   CHOLHDL 3 11/13/2017           Hypertension: Has been present, currently on lisinopril 20 mg and followed by PCP He only feels dizzy when he bends down and gets up and this is not a new symptom   BP Readings from Last 3 Encounters:  05/01/18 90/72  02/22/18 136/80  01/01/18 100/64    Most recent eye exam was in 2017  Most recent foot exam: 10/19  Currently known complications of diabetes: None, needs urine microalbumin checked and follow-up eye exam  He says his joint pains are better with adding methotrexate and prednisone is being tapered off  LABS:  Lab on 04/27/2018  Component Date Value Ref Range Status  . Sodium 04/27/2018 141  135 - 145 mEq/L Final  . Potassium 04/27/2018 4.8  3.5 - 5.1 mEq/L Final  . Chloride 04/27/2018 102  96 - 112 mEq/L Final  . CO2 04/27/2018 31  19  - 32 mEq/L Final  . Glucose, Bld 04/27/2018 176* 70 - 99 mg/dL Final  .  BUN 04/27/2018 15  6 - 23 mg/dL Final  . Creatinine, Ser 04/27/2018 0.94  0.40 - 1.50 mg/dL Final  . Calcium 04/27/2018 10.2  8.4 - 10.5 mg/dL Final  . GFR 04/27/2018 85.58  >60.00 mL/min Final    Physical Examination:  BP 90/72 (Patient Position: Standing)   Pulse 100   Ht 5\' 10"  (1.778 m)   Wt 204 lb 6.4 oz (92.7 kg)   SpO2 94%   BMI 29.33 kg/m   Standing blood pressure 90/72 No ankle edema  ASSESSMENT:  Diabetes type 2 with BMI 29  See history of present illness for detailed discussion of current diabetes management, blood sugar patterns and problems identified  His last A1c of 9.9 indicates poor control  However he appears to be responding to adding Invokana and Trulicity and replacing Januvia Morning sugars are still probably averaging about 150 and he may be higher after meals Is still not understanding the day-to-day management of his diabetes especially balanced meals, restricting carbohydrates, regular exercise, monitoring readings after meals and this was discussed  Essential hypertension: This has been mild and his blood pressure is now low with adding Invokana to his lisinopril    PLAN:    1. Glucose monitoring: Reminded him to start bringing his monitor for review on each visit . Advised to check readings either fasting or 2 hours after meals regularly and not just in the morning . Discussed blood sugar targets both fasting and after meals  2.  Diabetes education: . Patient will need some dietary education and will refer him to dietitian  3.  Lifestyle changes: . Needs to start walking daily . Advised him to avoid high carbohydrate breakfast meals and juice  4.  Medication changes needed: . He Will increase his Trulicity up to 1.5 mg weekly and to call if any nausea . Although he may benefit from a higher dose of Invokana with weight to stabilize his blood pressure first . He  needs to reduce his lisinopril to half a tablet  Patient is to follow instructions that were given today A1c on the next visit and will need short-term follow-up    Patient Instructions  Check blood sugars on waking up days a week  Also check blood sugars about 2 hours after meals and do this after different meals by rotation  Recommended blood sugar levels on waking up are 90-130 and about 2 hours after meal is 130-160  Please bring your blood sugar monitor to each visit, thank you  Take 2 Trulicity on Sat  Then use 1.5mg  weekly  Cut Lisinopril in 1/2  Avoid over 4oz juice  Walk daily    The counseling time on subjects discussed in assessment and plan sections is over 50% of today's 25 minute visit     Elayne Snare 05/01/2018, 1:20 PM   Note: This office note was prepared with Estate agent. Any transcriptional errors that result from this process are unintentional.

## 2018-05-01 ENCOUNTER — Ambulatory Visit (INDEPENDENT_AMBULATORY_CARE_PROVIDER_SITE_OTHER): Payer: Medicare Other | Admitting: Endocrinology

## 2018-05-01 ENCOUNTER — Encounter: Payer: Self-pay | Admitting: Endocrinology

## 2018-05-01 VITALS — BP 90/72 | HR 100 | Ht 70.0 in | Wt 204.4 lb

## 2018-05-01 DIAGNOSIS — I952 Hypotension due to drugs: Secondary | ICD-10-CM | POA: Diagnosis not present

## 2018-05-01 DIAGNOSIS — E1165 Type 2 diabetes mellitus with hyperglycemia: Secondary | ICD-10-CM

## 2018-05-01 MED ORDER — DULAGLUTIDE 1.5 MG/0.5ML ~~LOC~~ SOAJ: SUBCUTANEOUS | 1 refills | Status: DC

## 2018-05-01 NOTE — Patient Instructions (Addendum)
Check blood sugars on waking up days a week  Also check blood sugars about 2 hours after meals and do this after different meals by rotation  Recommended blood sugar levels on waking up are 90-130 and about 2 hours after meal is 130-160  Please bring your blood sugar monitor to each visit, thank you  Take 2 Trulicity on Sat  Then use 1.5mg  weekly  Cut Lisinopril in 1/2  Avoid over 4oz juice  Walk daily

## 2018-05-04 ENCOUNTER — Other Ambulatory Visit: Payer: Self-pay

## 2018-05-04 ENCOUNTER — Telehealth: Payer: Self-pay

## 2018-05-04 MED ORDER — DULAGLUTIDE 1.5 MG/0.5ML ~~LOC~~ SOAJ: 0.7500 mg | SUBCUTANEOUS | 1 refills | Status: DC

## 2018-05-04 NOTE — Telephone Encounter (Signed)
He was supposed to call us after taking 2 doses of the 0.75 mg on Saturday 12/14.  If he has had no nausea with this need to call in 1.5 mg weekly

## 2018-05-04 NOTE — Telephone Encounter (Signed)
Pt called and stated that Dr. Dwyane Dee would be doubling his dose of his Trulicity for his next Rx. Can you please verify the dose and frequency you would like the pt to take?

## 2018-05-07 ENCOUNTER — Other Ambulatory Visit: Payer: Self-pay

## 2018-05-07 MED ORDER — DULAGLUTIDE 1.5 MG/0.5ML ~~LOC~~ SOAJ: 1.5000 mg | SUBCUTANEOUS | 1 refills | Status: DC

## 2018-05-07 NOTE — Telephone Encounter (Signed)
Noted  

## 2018-05-08 ENCOUNTER — Other Ambulatory Visit: Payer: Self-pay

## 2018-05-08 MED ORDER — DULAGLUTIDE 1.5 MG/0.5ML ~~LOC~~ SOAJ: 1.5000 mg | SUBCUTANEOUS | 1 refills | Status: DC

## 2018-05-30 ENCOUNTER — Other Ambulatory Visit: Payer: Self-pay | Admitting: Family Medicine

## 2018-06-05 ENCOUNTER — Ambulatory Visit (INDEPENDENT_AMBULATORY_CARE_PROVIDER_SITE_OTHER): Payer: Medicare Other

## 2018-06-05 DIAGNOSIS — Z23 Encounter for immunization: Secondary | ICD-10-CM | POA: Diagnosis not present

## 2018-06-12 ENCOUNTER — Other Ambulatory Visit (INDEPENDENT_AMBULATORY_CARE_PROVIDER_SITE_OTHER): Payer: Medicare Other

## 2018-06-12 DIAGNOSIS — E1165 Type 2 diabetes mellitus with hyperglycemia: Secondary | ICD-10-CM | POA: Diagnosis not present

## 2018-06-12 LAB — COMPREHENSIVE METABOLIC PANEL
ALBUMIN: 4.7 g/dL (ref 3.5–5.2)
ALT: 16 U/L (ref 0–53)
AST: 14 U/L (ref 0–37)
Alkaline Phosphatase: 34 U/L — ABNORMAL LOW (ref 39–117)
BILIRUBIN TOTAL: 0.4 mg/dL (ref 0.2–1.2)
BUN: 12 mg/dL (ref 6–23)
CO2: 32 mEq/L (ref 19–32)
Calcium: 10.1 mg/dL (ref 8.4–10.5)
Chloride: 103 mEq/L (ref 96–112)
Creatinine, Ser: 0.82 mg/dL (ref 0.40–1.50)
GFR: 94.22 mL/min (ref >60.00)
Glucose, Bld: 157 mg/dL — ABNORMAL HIGH (ref 70–99)
Potassium: 4.4 mEq/L (ref 3.5–5.1)
Sodium: 141 mEq/L (ref 135–145)
Total Protein: 7.1 g/dL (ref 6.0–8.3)

## 2018-06-12 LAB — HEMOGLOBIN A1C: Hgb A1c MFr Bld: 7.1 % — ABNORMAL HIGH (ref 4.6–6.5)

## 2018-06-15 ENCOUNTER — Encounter: Payer: Self-pay | Admitting: Endocrinology

## 2018-06-15 ENCOUNTER — Ambulatory Visit (INDEPENDENT_AMBULATORY_CARE_PROVIDER_SITE_OTHER): Payer: Medicare Other | Admitting: Endocrinology

## 2018-06-15 VITALS — BP 124/84 | HR 75 | Ht 70.0 in | Wt 206.8 lb

## 2018-06-15 DIAGNOSIS — E1165 Type 2 diabetes mellitus with hyperglycemia: Secondary | ICD-10-CM | POA: Diagnosis not present

## 2018-06-15 NOTE — Patient Instructions (Addendum)
Check blood sugars on waking up days a week  Also check blood sugars about 2 hours after meals and do this after different meals by rotation  Recommended blood sugar levels on waking up are 90-130 and about 2 hours after meal is 130-160  Please bring your blood sugar monitor to each visit, thank you  Eye exam  Walk daily

## 2018-06-15 NOTE — Progress Notes (Signed)
Patient ID: Raymond Vasquez, male   DOB: 10-27-1950, 68 y.o.   MRN: [id number]          Reason for Appointment: Follow-up for Type 2 Diabetes   History of Present Illness:          Date of diagnosis of type 2 diabetes mellitus: 2003 approximately       Recent history:   Most recent A1c is 7.1 and previously was 9.9  Non-insulin hypoglycemic drugs the patient is taking are: Metformin 1 g twice daily, Amaryl 4 mg in a.m., Trulicity 1.5 mg weekly, Invokana 100 mg daily  Current management, blood sugar patterns and problems identified:  His TRULICITY was increased up to 1.5 mg on his last visit  He has not lost any weight, in fact has gained 2 pounds is A1c is better  Despite reminders he is not checking readings after meals and only in the morning  Although he has several good readings in the mornings he has frequently high readings also expressed to the last 5 days and he did not know why  He still has not made an appointment with the dietitian as recommended  He is still not exercising despite reminders and even when his joint pains are less  He is cutting back somewhat on his juice but may not be controlling portions or calories at various meals       Side effects from medications have been: None    Typical meal intake: Breakfast is skipped, sometimes juice and muffin               Exercise:  none  Glucose monitoring:  done 1 times a day         Glucometer: One Touch .       Blood Glucose readings by   Fasting range: 101-199 with AVERAGE 147, evening 167  Dietician visit, most recent: Before his lap band surgery  Weight history:280  Wt Readings from Last 3 Encounters:  06/15/18 206 lb 12.8 oz (93.8 kg)  05/01/18 204 lb 6.4 oz (92.7 kg)  02/22/18 201 lb (91.2 kg)    Glycemic control:   Lab Results  Component Value Date   HGBA1C 7.1 (H) 06/12/2018   HGBA1C 9.9 (A) 02/22/2018   HGBA1C 8.4 (H) 11/13/2017   Lab Results  Component Value Date   MICROALBUR <0.7  03/22/2018   LDLCALC 61 11/13/2017   CREATININE 0.82 06/12/2018   Lab Results  Component Value Date   MICRALBCREAT 2.6 03/22/2018    Lab Results  Component Value Date   FRUCTOSAMINE 281 03/22/2018    Lab on 06/12/2018  Component Date Value Ref Range Status  . Hgb A1c MFr Bld 06/12/2018 7.1* 4.6 - 6.5 % Final   Glycemic Control Guidelines for People with Diabetes:Non Diabetic:  <6%Goal of Therapy: <7%Additional Action Suggested:  >8%   . Sodium 06/12/2018 141  135 - 145 mEq/L Final  . Potassium 06/12/2018 4.4  3.5 - 5.1 mEq/L Final  . Chloride 06/12/2018 103  96 - 112 mEq/L Final  . CO2 06/12/2018 32  19 - 32 mEq/L Final  . Glucose, Bld 06/12/2018 157* 70 - 99 mg/dL Final  . BUN 06/12/2018 12  6 - 23 mg/dL Final  . Creatinine, Ser 06/12/2018 0.82  0.40 - 1.50 mg/dL Final  . Total Bilirubin 06/12/2018 0.4  0.2 - 1.2 mg/dL Final  . Alkaline Phosphatase 06/12/2018 34* 39 - 117 U/L Final  . AST 06/12/2018 14  0 - 37 U/L  Final  . ALT 06/12/2018 16  0 - 53 U/L Final  . Total Protein 06/12/2018 7.1  6.0 - 8.3 g/dL Final  . Albumin 06/12/2018 4.7  3.5 - 5.2 g/dL Final  . Calcium 06/12/2018 10.1  8.4 - 10.5 mg/dL Final  . GFR 06/12/2018 94.22  >60.00 mL/min Final    Allergies as of 06/15/2018   No Known Allergies     Medication List       Accurate as of June 15, 2018  1:12 PM. Always use your most recent med list.        canagliflozin 100 MG Tabs tablet Commonly known as:  INVOKANA 1 tablet before breakfast   Dulaglutide 1.5 MG/0.5ML Sopn Commonly known as:  TRULICITY Inject 1.5 mg into the skin once a week. Inject weekly   glimepiride 2 MG tablet Commonly known as:  AMARYL TAKE 2 TABLETS BY MOUTH DAILY WITH BREAKFAST   lisinopril 20 MG tablet Commonly known as:  PRINIVIL,ZESTRIL TAKE 1 TABLET BY MOUTH EVERY DAY   metFORMIN 1000 MG tablet Commonly known as:  GLUCOPHAGE TAKE 1 TABLET BY MOUTH TWICE DAILY WITH MEAL   ONETOUCH DELICA LANCETS FINE Misc Use as  instructed by provider   ONETOUCH VERIO test strip Generic drug:  glucose blood CHECK BLOOD SUGAR THREE TIMES DAILY   predniSONE 5 MG tablet Commonly known as:  DELTASONE TK 2 TO 3 TS PO QD   rosuvastatin 20 MG tablet Commonly known as:  CRESTOR Take 1 tablet (20 mg total) by mouth daily.       Allergies: No Known Allergies  Past Medical History:  Diagnosis Date  . Angioedema    he is unsure of this or cause  . 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 Diabetes mellitus type II, controlled, with no complications ([place])   . Hemorrhoids   . 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 Hyperlipidemia   . Hypertension   . Nephrolithiasis    1980s  . OSA (obstructive sleep apnea)   . PMR (polymyalgia rheumatica) (HCC)   . Shingles    2014    Past Surgical History:  Procedure Laterality Date  . APPENDECTOMY    . FINGER SURGERY  06/2012  . LAPAROSCOPIC GASTRIC BANDING  08/10/2009    Family History  Problem Relation Age of Onset  . Heart disease Mother   . Diabetes Maternal Grandmother     Social History:  reports that he has been smoking cigars. His smokeless tobacco use includes chew. He reports that he does not drink alcohol or use drugs.   Review of Systems   Lipid history: Treated by PCP with Crestor 20 mg daily    Lab Results  Component Value Date   CHOL 129 11/13/2017   HDL 41.80 11/13/2017   LDLCALC 61 11/13/2017   LDLDIRECT 154.1 02/16/2010   TRIG 130.0 11/13/2017   CHOLHDL 3 11/13/2017           Hypertension: Has been present, currently on lisinopril 10 mg and the dose was reduced with starting Invokana and after his blood pressure was low in December  Home BP about 120/80  BP Readings from Last 3 Encounters:  06/15/18 124/84  05/01/18 90/72  02/22/18 136/80    Most recent eye exam was in 2017, he is going to schedule follow-up  Most recent foot exam: 10/19  Currently known complications of diabetes: None, needs urine  microalbumin checked and follow-up eye exam  He is on methotrexate injections for his rheumatoid  arthritis  LABS:  Lab on 06/12/2018  Component Date Value Ref Range Status  . Hgb A1c MFr Bld 06/12/2018 7.1* 4.6 - 6.5 % Final   Glycemic Control Guidelines for People with Diabetes:Non Diabetic:  <6%Goal of Therapy: <7%Additional Action Suggested:  >8%   . Sodium 06/12/2018 141  135 - 145 mEq/L Final  . Potassium 06/12/2018 4.4  3.5 - 5.1 mEq/L Final  . Chloride 06/12/2018 103  96 - 112 mEq/L Final  . CO2 06/12/2018 32  19 - 32 mEq/L Final  . Glucose, Bld 06/12/2018 157* 70 - 99 mg/dL Final  . BUN 06/12/2018 12  6 - 23 mg/dL Final  . Creatinine, Ser 06/12/2018 0.82  0.40 - 1.50 mg/dL Final  . Total Bilirubin 06/12/2018 0.4  0.2 - 1.2 mg/dL Final  . Alkaline Phosphatase 06/12/2018 34* 39 - 117 U/L Final  . AST 06/12/2018 14  0 - 37 U/L Final  . ALT 06/12/2018 16  0 - 53 U/L Final  . Total Protein 06/12/2018 7.1  6.0 - 8.3 g/dL Final  . Albumin 06/12/2018 4.7  3.5 - 5.2 g/dL Final  . Calcium 06/12/2018 10.1  8.4 - 10.5 mg/dL Final  . GFR 06/12/2018 94.22  >60.00 mL/min Final    Physical Examination:  BP 124/84 (BP Location: Left Arm, Patient Position: Sitting, Cuff Size: Normal)   Pulse 75   Ht 5\' 10"  (1.778 m)   Wt 206 lb 12.8 oz (93.8 kg)   SpO2 91%   BMI 29.67 kg/m      ASSESSMENT:  Diabetes type 2 with BMI 29  See history of present illness for detailed discussion of current diabetes management, blood sugar patterns and problems identified  His A1c is much better at 7.1 compared to 9.9  He is benefiting from [place] and also increasing the Trulicity up to 1.5 His morning blood sugars are still not consistent and may be dependent on his diet the evening before He is forgetting to check his sugars in the evening Also not exercising Has been recommended consultation with dietitian and is still not done so  Essential hypertension: This has been  controlled with 10 mg lisinopril   PLAN:    1. Glucose monitoring: Check readings after meals alternating with morning readings   2.  Diabetes education: . Patient will need to make an appointment with the dietitian  3.  Lifestyle changes: . Needs to start walking daily and try to walk indoors in winter . He will work with the dietitian to help with weight loss  4.  Medication changes needed: . None, he will continue current regimen  A1c in 3 months He can go ahead and schedule his eye exam   Patient Instructions  Check blood sugars on waking up days a week  Also check blood sugars about 2 hours after meals and do this after different meals by rotation  Recommended blood sugar levels on waking up are 90-130 and about 2 hours after meal is 130-160  Please bring your blood sugar monitor to each visit, thank you  Eye exam  Walk daily           Elayne Snare 06/15/2018, 1:12 PM   Note: This office note was prepared with Dragon voice recognition system technology. Any transcriptional errors that result from this process are unintentional.

## 2018-06-19 ENCOUNTER — Other Ambulatory Visit: Payer: Self-pay | Admitting: Endocrinology

## 2018-06-26 LAB — HM DIABETES EYE EXAM

## 2018-06-28 ENCOUNTER — Other Ambulatory Visit: Payer: Self-pay | Admitting: Family Medicine

## 2018-07-05 ENCOUNTER — Encounter: Payer: Self-pay | Admitting: Family Medicine

## 2018-07-09 ENCOUNTER — Telehealth: Payer: Self-pay

## 2018-07-09 ENCOUNTER — Telehealth: Payer: Self-pay | Admitting: Endocrinology

## 2018-07-09 NOTE — Telephone Encounter (Signed)
Glad he called as looks like he is due for CPE and recheck on cholesterol. Looks like we advised daily - but we can check to see what his cholesterol is then adjust. Please schedule cpe and advise he come fasting. Thank.s

## 2018-07-09 NOTE — Telephone Encounter (Signed)
Patient stated that his pharmacist called yesterday and wondered why he isn't taking his ROSUVASTATIN 20 mg as prescribed. He takes 1 tablet per day and he thought he was supposed to take it once a week.   Please advise

## 2018-07-09 NOTE — Telephone Encounter (Signed)
Copied from [place] [id number]. Topic: General - Other >> Jul 09, 2018  9:16 AM Carolyn Stare wrote:  Pt the pharmacy called him to ask why he had not requested a refill on rosuvastatin (CRESTOR) 20 MG tablet  he said he told the pharmacy he was only taking the medicine once a week  and they asked him why and he said someone told him to only take once a week but he does not remember who told and the pharmacy told him to check with the doctor cause this med should be taken daily.

## 2018-07-09 NOTE — Telephone Encounter (Signed)
I called the pt and informed him of the message below.  Appt scheduled for 07/24/18.

## 2018-07-09 NOTE — Telephone Encounter (Signed)
Called pt and spoke with him regarding this. Pt stated that he has no clue how or why he began taking it once weekly, but he thought one of his doctors told him to take it like this. Pt was informed that according to Dr. Ronnie Derby last office note, there were no instructions telling pt to stop taking Crestor daily. Pt verbalized understanding and stated that he began taking the Crestor once daily again as of this past Saturday.

## 2018-07-23 NOTE — Progress Notes (Signed)
Medicare Annual Preventive Care Visit  (initial annual wellness or annual wellness exam)  Concerns and/or follow up today:  Here for CPE: Due for lipids, ? PSA  -Concerns and/or follow up today: none PMH diabetes (sees endo for management), HTN, HLD, RA (seeing rheumatology) and elevated BMI. Reports doing well. On mtx with rheum and reports had labs yesterday. BS much better, seeing endo. Low BP and dizziy when stands up suddenly or lifts up quickly for some time. No CP, SOB, DOE, fevers, malaise.  -Diet: variety of foods, balance and well rounded, larger portion sizes -Exercise: no regular exercise -Diabetes and Dyslipidemia Screening: -Hx of HTN: no -Vaccines: UTD -sexual activity: yes, male partner, no new partners -wants STI testing, Hep C screening (if born 08-1961): no -FH colon or prstate ca: see FH Last colon cancer screening: had colonoscopy 10/2017 Last prostate ca screening: 2014, discussed risks/benefits, he declined DRE but agreed to PSA -Alcohol, Tobacco, drug use: see social history   See HM section in Epic for other details of completed HM. See scanned documentation under Media Tab for further documentation HPI, health risk assessment. See Media Tab and Care Teams sections in Epic for other providers.  ROS: negative for report of fevers, unintentional weight loss, vision changes, vision loss, hearing loss or change, chest pain, sob, hemoptysis, melena, hematochezia, hematuria, genital discharge or lesions, falls, bleeding or bruising, loc, thoughts of suicide or self harm, memory loss  1.) Patient-completed health risk assessment  - completed and reviewed, see scanned documentation  2.) Review of Medical History: -PMH, PSH, Family History and current specialty and care providers reviewed and updated and listed below  - see scanned in document in chart and below  Past Medical History:  Diagnosis Date  . Angioedema    he is unsure of this or cause  .  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 Diabetes mellitus type II, controlled, with no complications ([place])   . Hemorrhoids   . 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 Hyperlipidemia   . Hypertension   . Nephrolithiasis    1980s  . OSA (obstructive sleep apnea)   . PMR (polymyalgia rheumatica) (HCC)   . Shingles    2014    Past Surgical History:  Procedure Laterality Date  . APPENDECTOMY    . FINGER SURGERY  06/2012  . LAPAROSCOPIC GASTRIC BANDING  08/10/2009    Social History   Socioeconomic History  . Marital status: Married    Spouse name: Not on file  . Number of children: Not on file  . Years of education: Not on file  . Highest education level: Not on file  Occupational History  . Not on file  Social Needs  . Financial resource strain: Not on file  . Food insecurity:    Worry: Not on file    Inability: Not on file  . Transportation needs:    Medical: Not on file    Non-medical: Not on file  Tobacco Use  . Smoking status: Current Some Day Smoker    Types: Cigars  . Smokeless tobacco: Current User    Types: Chew  . Tobacco comment: 1 cigar occassionally, not more then 1x per week  Substance and Sexual Activity  . Alcohol use: No  . Drug use: No  . Sexual activity: Not on file  Lifestyle  . Physical activity:    Days per week: Not on file    Minutes per session: Not on file  . Stress: Not on file  Relationships  . Social connections:    Talks on phone: Not on file    Gets together: Not on file    Attends religious service: Not on file    Active member of club or organization: Not on file    Attends meetings of clubs or organizations: Not on file    Relationship status: Not on file  . Intimate partner violence:    Fear of current or ex partner: Not on file    Emotionally abused: Not on file    Physically abused: Not on file    Forced sexual activity: Not on file  Other Topics Concern  . Not on file  Social History Narrative    Work or School: Sports coach with [employer] Situation: lives with wifes      Spiritual Beliefs: none      Lifestyle: no regular exercise, healthy diet       Family History  Problem Relation Age of Onset  . Heart disease Mother   . Diabetes Maternal Grandmother     Current Outpatient Medications on File Prior to Visit  Medication Sig Dispense Refill  . Dulaglutide (TRULICITY) 1.5 ZJ/6.7HA SOPN Inject 1.5 mg into the skin once a week. Inject weekly 8 pen 1  . glimepiride (AMARYL) 4 MG tablet Take 1 tablet (4 mg total) by mouth daily with breakfast. 90 tablet 1  . INVOKANA 100 MG TABS tablet TAKE 1 TABLET BY MOUTH BEFORE BREAKFAST 30 tablet 3  . lisinopril (PRINIVIL,ZESTRIL) 20 MG tablet TAKE 1 TABLET BY MOUTH EVERY DAY (Patient taking differently: Take 10 mg by mouth daily. ) 30 tablet 5  . metFORMIN (GLUCOPHAGE) 1000 MG tablet TAKE 1 TABLET BY MOUTH TWICE DAILY WITH MEAL 180 tablet 1  . Methotrexate Sodium (METHOTREXATE, PF,) 50 MG/2ML injection INJECT 1 ML ONCE A WEEK    . ONETOUCH DELICA LANCETS FINE MISC Use as instructed by provider 200 each 3  . ONETOUCH VERIO test strip CHECK BLOOD SUGAR THREE TIMES DAILY 100 each 5  . predniSONE (DELTASONE) 5 MG tablet TK 2 TO 3 TS PO QD  0  . rosuvastatin (CRESTOR) 20 MG tablet Take 1 tablet (20 mg total) by mouth daily. 90 tablet 3   Current Facility-Administered Medications on File Prior to Visit  Medication Dose Route Frequency Provider Last Rate Last Dose  . 0.9 %  sodium chloride infusion  500 mL Intravenous Once Pyrtle, Lajuan Lines, MD      . 0.9 %  sodium chloride infusion  500 mL Intravenous Once Pyrtle, Lajuan Lines, MD         3.) Review of functional ability and level of safety:  Any difficulty hearing?  See scanned documentation  History of falling?  See scanned documentation  Any trouble with IADLs - using a phone, using transportation, grocery shopping, preparing meals, doing housework, doing laundry, taking  medications and managing money?  See scanned documentation  Advance Directives?  Discussed briefly and offered more resources and detailed discussion with our trained staff.   See summary of recommendations in Patient Instructions below.  4.) Physical Exam Vitals:   07/24/18 0754  BP: 96/64  Pulse: 76  Temp: 98.1 F (36.7 C)   Estimated body mass index is 29.21 kg/m as calculated from the following:   Height as of this encounter: 5' 10.25" (1.784 m).   Weight as of this encounter: 205 lb (93 kg).  EKG (optional): deferred  General: alert, appear well  hydrated and in no acute distress  HEENT: visual acuity grossly intact, see vision screen in epic  CV: HRRR, no LE edema  Lungs: CTA bilaterally  ABD: BS+4Q, soft, NTTP  Psych: pleasant and cooperative, no obvious depression or anxiety  Cognitive function grossly intact  See patient instructions for recommendations.  Education and counseling regarding the above review of health provided with a plan for the following: -see scanned patient completed form for further details -fall prevention strategies discussed  -healthy lifestyle discussed -importance and resources for completing advanced directives discussed -see patient instructions below for any other recommendations provided  4)The following written screening schedule of preventive measures were reviewed with assessment and plan made per below, orders and patient instructions:      AAA screening done if applicable     Alcohol screening done     Obesity Screening and counseling done     STI screening (Hep C if born 1943-65) offered and per pt wishes     Tobacco Screening done        Pneumococcal (PPSV23 -one dose after 64, one before if risk factors), influenza yearly and hepatitis B vaccines (if high risk - end stage renal disease, IV drugs, homosexual men, live in home for mentally retarded, hemophilia receiving factors) ASSESSMENT/PLAN: done if  applicable       Prostate cancer screening ASSESSMENT/PLAN: discussed option, limitations, risks - he agreed to psa      Colorectal cancer screening (FOBT yearly or flex sig q4y or colonoscopy q10y or barium enema q4y) ASSESSMENT/PLAN: utd or ordered      Diabetes outpatient self-management training services ASSESSMENT/PLAN: utd or done      Bone mass measurements(covered q2y if indicated - estrogen def, osteoporosis, hyperparathyroid, vertebral abnormalities, osteoporosis or steroids) ASSESSMENT/PLAN: utd or discussed and ordered per pt wishes      Screening for glaucoma(q1y if high risk - diabetes, FH, AA and > 12 or hispanic and > 65) ASSESSMENT/PLAN: utd or advised      Medical nutritional therapy for individuals with diabetes or renal disease ASSESSMENT/PLAN: see orders      Cardiovascular screening blood tests (lipids q5y) ASSESSMENT/PLAN: see orders and labs      Diabetes screening tests ASSESSMENT/PLAN: see orders and labs   7.) Summary:   Medicare annual wellness visit, subsequent -risk factors and conditions per above assessment were discussed and treatment, recommendations and referrals were offered per documentation above and orders and patient instructions.  Rheumatoid arthritis involving multiple sites, unspecified rheumatoid factor presence (HCC) -seeing rheum for management, recently on mtx, reports had labs yesterday, advised CMA to obtain labs  Prostate cancer screening - Plan: PSA  Hypertension associated with diabetes ([place]) -seems running low and having mild orthostatic symptoms -opted to decrease dose of lisinopril to 5mg  daily  Hyperlipidemia associated with type 2 diabetes mellitus ([place]) - Plan: Lipid panel -lifestyle recs, statin, monitor  Type 2 diabetes mellitus with other specified complication, without long-term current use of insulin ([facility]) -with metabolic syndrome, elevated BMI, htn, HLD -seeing endo for management -control  improving -cont current management and encouraged improved lifestyle as well  BMI 29.0-29.9,adult -lifestyle recs, advised increase gentle exercise as able  Patient Instructions   BEFORE YOU LEAVE: -ensure vision screen, fall risks, dep screening in epic -labs -obtain labs from rheumatology -follow up: 1-2 months for BP recheck   Raymond Vasquez , Thank you for taking time to come for your Medicare Wellness Visit. I appreciate your ongoing commitment to your health goals.  Please review the following plan we discussed and let me know if I can assist you in the future.   These are the goals we discussed: Goals   Decrease lisinopril to 5 mg daily. I sent a new prescriptions.   Consider the shinrix vaccine for shingles.  PSA (prostate screening) today.  Gradually increase exercise with a goal of 150 minutes daily  Eat a healthy low sugar diet     This is a list of the screening recommended for you and due dates:  Health Maintenance  Topic Date Due  . HIV Screening  11/19/2024*  . Hemoglobin A1C  12/11/2018  . Complete foot exam   02/23/2019  . Eye exam for diabetics  06/27/2019  . Pneumonia vaccines (2 of 2 - PPSV23) 11/20/2019  . Colon Cancer Screening  11/15/2020  . Tetanus Vaccine  07/13/2021  . Flu Shot  Completed  .  Hepatitis C: One time screening is recommended by Center for Disease Control  (CDC) for  adults born from [birth year] through 1963.   Completed  *Topic was postponed. The date shown is not the original due date.     We recommend the following healthy lifestyle for LIFE: 1) Small portions. But, make sure to get regular (at least 3 per day), healthy meals and small healthy snacks if needed.  2) Eat a healthy clean diet.   TRY TO EAT: -at least 5-7 servings of low sugar, colorful, and nutrient rich vegetables per day (not corn, potatoes or bananas.) -berries are the best choice if you wish to eat fruit (only eat small amounts if trying to reduce weight)  -lean  meets (fish, white meat of chicken or [place]) -vegan proteins for some meals - beans or tofu, whole grains, nuts and seeds -Replace bad fats with good fats - good fats include: fish, nuts and seeds, canola oil, olive oil -small amounts of low fat or non fat dairy -small amounts of100 % whole grains - check the lables -drink plenty of water  AVOID: -SUGAR, sweets, anything with added sugar, corn syrup or sweeteners - must read labels as even foods advertised as "healthy" often are loaded with sugar -if you must have a sweetener, small amounts of stevia may be best -sweetened beverages and artificially sweetened beverages -simple starches (rice, bread, potatoes, pasta, chips, etc - small amounts of 100% whole grains are ok) -red meat, pork, butter -fried foods, fast food, processed food, excessive dairy, eggs and coconut.  3)Get at least 150 minutes of sweaty aerobic exercise per week.  4)Reduce stress - consider counseling, meditation and relaxation to balance other aspects of your life.    Raymond Kern, DO

## 2018-07-24 ENCOUNTER — Encounter: Payer: Self-pay | Admitting: Family Medicine

## 2018-07-24 ENCOUNTER — Ambulatory Visit (INDEPENDENT_AMBULATORY_CARE_PROVIDER_SITE_OTHER): Payer: Medicare Other | Admitting: Family Medicine

## 2018-07-24 VITALS — BP 96/64 | HR 76 | Temp 98.1°F | Ht 70.25 in | Wt 205.0 lb

## 2018-07-24 DIAGNOSIS — Z125 Encounter for screening for malignant neoplasm of prostate: Secondary | ICD-10-CM | POA: Diagnosis not present

## 2018-07-24 DIAGNOSIS — E785 Hyperlipidemia, unspecified: Secondary | ICD-10-CM | POA: Diagnosis not present

## 2018-07-24 DIAGNOSIS — E1159 Type 2 diabetes mellitus with other circulatory complications: Secondary | ICD-10-CM | POA: Diagnosis not present

## 2018-07-24 DIAGNOSIS — Z6829 Body mass index (BMI) 29.0-29.9, adult: Secondary | ICD-10-CM | POA: Diagnosis not present

## 2018-07-24 DIAGNOSIS — E1169 Type 2 diabetes mellitus with other specified complication: Secondary | ICD-10-CM | POA: Diagnosis not present

## 2018-07-24 DIAGNOSIS — M069 Rheumatoid arthritis, unspecified: Secondary | ICD-10-CM | POA: Diagnosis not present

## 2018-07-24 DIAGNOSIS — I152 Hypertension secondary to endocrine disorders: Secondary | ICD-10-CM

## 2018-07-24 DIAGNOSIS — Z0000 Encounter for general adult medical examination without abnormal findings: Secondary | ICD-10-CM | POA: Diagnosis not present

## 2018-07-24 DIAGNOSIS — I10 Essential (primary) hypertension: Secondary | ICD-10-CM

## 2018-07-24 LAB — PSA: PSA: 1.35 ng/mL (ref 0.10–4.00)

## 2018-07-24 LAB — LIPID PANEL
Cholesterol: 107 mg/dL (ref 0–200)
HDL: 36.1 mg/dL — ABNORMAL LOW (ref >39.00)
LDL Cholesterol: 45 mg/dL (ref 0–99)
NonHDL: 70.86
Total CHOL/HDL Ratio: 3
Triglycerides: 130 mg/dL (ref 0.0–149.0)
VLDL: 26 mg/dL (ref 0.0–40.0)

## 2018-07-24 MED ORDER — LISINOPRIL 5 MG PO TABS: 5.0000 mg | ORAL_TABLET | Freq: Every day | ORAL | 3 refills | Status: DC

## 2018-07-24 NOTE — Patient Instructions (Addendum)
BEFORE YOU LEAVE: -ensure vision screen, fall risks, dep screening in epic -labs -obtain labs from rheumatology -follow up: 1-2 months for BP recheck   [name] , Thank you for taking time to come for your Medicare Wellness Visit. I appreciate your ongoing commitment to your health goals. Please review the following plan we discussed and let me know if I can assist you in the future.   These are the goals we discussed: Goals   Decrease lisinopril to 5 mg daily. I sent a new prescriptions.   Consider the shinrix vaccine for shingles.  PSA (prostate screening) today.  Gradually increase exercise with a goal of 150 minutes daily  Eat a healthy low sugar diet     This is a list of the screening recommended for you and due dates:  Health Maintenance  Topic Date Due  . HIV Screening  11/19/2024*  . Hemoglobin A1C  12/11/2018  . Complete foot exam   02/23/2019  . Eye exam for diabetics  06/27/2019  . Pneumonia vaccines (2 of 2 - PPSV23) 11/20/2019  . Colon Cancer Screening  11/15/2020  . Tetanus Vaccine  07/13/2021  . Flu Shot  Completed  .  Hepatitis C: One time screening is recommended by Center for Disease Control  (CDC) for  adults born from [birth year] through 1963.   Completed  *Topic was postponed. The date shown is not the original due date.     We recommend the following healthy lifestyle for LIFE: 1) Small portions. But, make sure to get regular (at least 3 per day), healthy meals and small healthy snacks if needed.  2) Eat a healthy clean diet.   TRY TO EAT: -at least 5-7 servings of low sugar, colorful, and nutrient rich vegetables per day (not corn, potatoes or bananas.) -berries are the best choice if you wish to eat fruit (only eat small amounts if trying to reduce weight)  -lean meets (fish, white meat of chicken or [place]) -vegan proteins for some meals - beans or tofu, whole grains, nuts and seeds -Replace bad fats with good fats - good fats include: fish, nuts  and seeds, canola oil, olive oil -small amounts of low fat or non fat dairy -small amounts of100 % whole grains - check the lables -drink plenty of water  AVOID: -SUGAR, sweets, anything with added sugar, corn syrup or sweeteners - must read labels as even foods advertised as "healthy" often are loaded with sugar -if you must have a sweetener, small amounts of stevia may be best -sweetened beverages and artificially sweetened beverages -simple starches (rice, bread, potatoes, pasta, chips, etc - small amounts of 100% whole grains are ok) -red meat, pork, butter -fried foods, fast food, processed food, excessive dairy, eggs and coconut.  3)Get at least 150 minutes of sweaty aerobic exercise per week.  4)Reduce stress - consider counseling, meditation and relaxation to balance other aspects of your life.

## 2018-08-28 ENCOUNTER — Other Ambulatory Visit: Payer: Self-pay | Admitting: Family Medicine

## 2018-09-17 ENCOUNTER — Other Ambulatory Visit: Payer: Self-pay

## 2018-09-17 ENCOUNTER — Other Ambulatory Visit (INDEPENDENT_AMBULATORY_CARE_PROVIDER_SITE_OTHER): Payer: Medicare Other

## 2018-09-17 DIAGNOSIS — E1165 Type 2 diabetes mellitus with hyperglycemia: Secondary | ICD-10-CM | POA: Diagnosis not present

## 2018-09-17 LAB — COMPREHENSIVE METABOLIC PANEL
ALT: 25 U/L (ref 0–53)
AST: 17 U/L (ref 0–37)
Albumin: 4.8 g/dL (ref 3.5–5.2)
Alkaline Phosphatase: 36 U/L — ABNORMAL LOW (ref 39–117)
BUN: 14 mg/dL (ref 6–23)
CO2: 31 mEq/L (ref 19–32)
Calcium: 10.1 mg/dL (ref 8.4–10.5)
Chloride: 105 mEq/L (ref 96–112)
Creatinine, Ser: 0.94 mg/dL (ref 0.40–1.50)
GFR: 80.42 mL/min (ref >60.00)
Glucose, Bld: 149 mg/dL — ABNORMAL HIGH (ref 70–99)
Potassium: 4.3 mEq/L (ref 3.5–5.1)
Sodium: 144 mEq/L (ref 135–145)
Total Bilirubin: 0.5 mg/dL (ref 0.2–1.2)
Total Protein: 7 g/dL (ref 6.0–8.3)

## 2018-09-17 LAB — HEMOGLOBIN A1C: Hgb A1c MFr Bld: 6.6 % — ABNORMAL HIGH (ref 4.6–6.5)

## 2018-09-19 NOTE — Progress Notes (Signed)
Patient ID: Raymond Vasquez, male   DOB: [birth year], 68 y.o.   MRN: [id number]          Reason for Appointment: Follow-up for Type 2 Diabetes   Today's office visit was provided via telemedicine using video technique Explained to the patient and the the limitations of evaluation and management by telemedicine and the availability of in person appointments.  The patient understood the limitations and agreed to proceed. Patient also understood that the telehealth visit is billable. . Location of the patient: Home . Location of the provider: Office Only the patient and myself were participating in the encounter     History of Present Illness:          Date of diagnosis of type 2 diabetes mellitus: 2003 approximately       Recent history:   His A1c has come down to 6.6, previously 7.1  Non-insulin hypoglycemic drugs the patient is taking are: Metformin 1 g twice daily, Amaryl 4 mg in a.m., Trulicity 1.5 mg weekly, Invokana 100 mg daily  Current management, blood sugar patterns and problems identified:  He has checked some blood sugars almost daily at home recently but they seem to be on fasting  He thinks they may fluctuate periodically and may be as low as 99 but as high as 268  Also is trying to drink more water and cut back on juice and regular soft drinks, only occasionally may have a Pepsi  He has difficulty with his joint pains and some days when he is not having pain he will be more active with yard work and some walking  Overall he thinks he is planning his meals better and eating healthier but has not lost any weight  Lab glucose was 149 fasting  No side effects with Trulicity which he takes regularly       Side effects from medications have been: None    Typical meal intake: Breakfast is usually skipped, sometimes juice and muffin               Exercise:  none  Glucose monitoring:  done 1 times a day         Glucometer: One Touch .       Blood Glucose readings  by patient review  RECENT fasting range 121-268  Unknown average  Dietician visit, most recent: Before his lap band surgery  Weight history: Maximum 280 in the past  Wt Readings from Last 3 Encounters:  07/24/18 205 lb (93 kg)  06/15/18 206 lb 12.8 oz (93.8 kg)  05/01/18 204 lb 6.4 oz (92.7 kg)    Glycemic control:   Lab Results  Component Value Date   HGBA1C 6.6 (H) 09/17/2018   HGBA1C 7.1 (H) 06/12/2018   HGBA1C 9.9 (A) 02/22/2018   Lab Results  Component Value Date   MICROALBUR <0.7 03/22/2018   LDLCALC 45 07/24/2018   CREATININE 0.94 09/17/2018   Lab Results  Component Value Date   MICRALBCREAT 2.6 03/22/2018    Lab Results  Component Value Date   FRUCTOSAMINE 281 03/22/2018    Lab on 09/17/2018  Component Date Value Ref Range Status  . Sodium 09/17/2018 144  135 - 145 mEq/L Final  . Potassium 09/17/2018 4.3  3.5 - 5.1 mEq/L Final  . Chloride 09/17/2018 105  96 - 112 mEq/L Final  . CO2 09/17/2018 31  19 - 32 mEq/L Final  . Glucose, Bld 09/17/2018 149* 70 - 99 mg/dL Final  . BUN 09/17/2018 14  6 - 23 mg/dL Final  . Creatinine, Ser 09/17/2018 0.94  0.40 - 1.50 mg/dL Final  . Total Bilirubin 09/17/2018 0.5  0.2 - 1.2 mg/dL Final  . Alkaline Phosphatase 09/17/2018 36* 39 - 117 U/L Final  . AST 09/17/2018 17  0 - 37 U/L Final  . ALT 09/17/2018 25  0 - 53 U/L Final  . Total Protein 09/17/2018 7.0  6.0 - 8.3 g/dL Final  . Albumin 09/17/2018 4.8  3.5 - 5.2 g/dL Final  . Calcium 09/17/2018 10.1  8.4 - 10.5 mg/dL Final  . GFR 09/17/2018 80.42  >60.00 mL/min Final  . Hgb A1c MFr Bld 09/17/2018 6.6* 4.6 - 6.5 % Final   Glycemic Control Guidelines for People with Diabetes:Non Diabetic:  <6%Goal of Therapy: <7%Additional Action Suggested:  >8%     Allergies as of 09/20/2018   No Known Allergies     Medication List       Accurate as of September 19, 2018  9:05 PM. Always use your most recent med list.        Dulaglutide 1.5 MG/0.5ML Sopn Commonly known as:   Trulicity Inject 1.5 mg into the skin once a week. Inject weekly   glimepiride 4 MG tablet Commonly known as:  AMARYL Take 1 tablet (4 mg total) by mouth daily with breakfast.   Invokana 100 MG Tabs tablet Generic drug:  canagliflozin TAKE 1 TABLET BY MOUTH BEFORE BREAKFAST   lisinopril 5 MG tablet Commonly known as:  ZESTRIL Take 1 tablet (5 mg total) by mouth daily.   metFORMIN 1000 MG tablet Commonly known as:  GLUCOPHAGE TAKE 1 TABLET BY MOUTH TWICE DAILY WITH MEAL   methotrexate (PF) 50 MG/2ML injection INJECT 1 ML ONCE A WEEK   OneTouch Delica Lancets Fine Misc Use as instructed by provider   OneTouch Verio test strip Generic drug:  glucose blood CHECK BLOOD SUGAR THREE TIMES DAILY   rosuvastatin 20 MG tablet Commonly known as:  CRESTOR TAKE 1 TABLET(20 MG) BY MOUTH DAILY       Allergies: No Known Allergies  Past Medical History:  Diagnosis Date  . Angioedema    he is unsure of this or cause  . 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 Diabetes mellitus type II, controlled, with no complications ([place])   . Hemorrhoids   . 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 Hyperlipidemia   . Hypertension   . Nephrolithiasis    1980s  . OSA (obstructive sleep apnea)   . PMR (polymyalgia rheumatica) ([facility])   . Shingles    2014    Past Surgical History:  Procedure Laterality Date  . APPENDECTOMY    . FINGER SURGERY  06/2012  . LAPAROSCOPIC GASTRIC BANDING  08/10/2009    Family History  Problem Relation Age of Onset  . Heart disease Mother   . Diabetes Maternal Grandmother     Social History:  reports that he has been smoking cigars. His smokeless tobacco use includes chew. He reports that he does not drink alcohol or use drugs.   Review of Systems   Lipid history: Followed by PCP, has adequate levels on Crestor 20 mg daily    Lab Results  Component Value Date   CHOL 107 07/24/2018   HDL 36.10 (L) 07/24/2018   LDLCALC 45 07/24/2018    LDLDIRECT 154.1 02/16/2010   TRIG 130.0 07/24/2018   CHOLHDL 3 07/24/2018           Hypertension: Has been present, currently on  only 5 mg lisinopril  Has not checked blood pressure at home recently Appears to be needing less medication with continuing Invokana  BP Readings from Last 3 Encounters:  07/24/18 96/64  06/15/18 124/84  05/01/18 90/72    Most recent eye exam was in 2020, negative for retinopathy  Most recent foot exam: 10/19  Currently known complications of diabetes: None, needs urine microalbumin checked and follow-up eye exam  He is on methotrexate injections for his rheumatoid arthritis  LABS:  Lab on 09/17/2018  Component Date Value Ref Range Status  . Sodium 09/17/2018 144  135 - 145 mEq/L Final  . Potassium 09/17/2018 4.3  3.5 - 5.1 mEq/L Final  . Chloride 09/17/2018 105  96 - 112 mEq/L Final  . CO2 09/17/2018 31  19 - 32 mEq/L Final  . Glucose, Bld 09/17/2018 149* 70 - 99 mg/dL Final  . BUN 09/17/2018 14  6 - 23 mg/dL Final  . Creatinine, Ser 09/17/2018 0.94  0.40 - 1.50 mg/dL Final  . Total Bilirubin 09/17/2018 0.5  0.2 - 1.2 mg/dL Final  . Alkaline Phosphatase 09/17/2018 36* 39 - 117 U/L Final  . AST 09/17/2018 17  0 - 37 U/L Final  . ALT 09/17/2018 25  0 - 53 U/L Final  . Total Protein 09/17/2018 7.0  6.0 - 8.3 g/dL Final  . Albumin 09/17/2018 4.8  3.5 - 5.2 g/dL Final  . Calcium 09/17/2018 10.1  8.4 - 10.5 mg/dL Final  . GFR 09/17/2018 80.42  >60.00 mL/min Final  . Hgb A1c MFr Bld 09/17/2018 6.6* 4.6 - 6.5 % Final   Glycemic Control Guidelines for People with Diabetes:Non Diabetic:  <6%Goal of Therapy: <7%Additional Action Suggested:  >8%     Physical Examination:  There were no vitals taken for this visit.     ASSESSMENT:  Diabetes type 2 with BMI 29  See history of present illness for detailed discussion of current diabetes management, blood sugar patterns and problems identified  His A1c is much better at 6.6, has been as high  as 9.9 and is at his best level  Currently on multiple drugs including Trulicity and Invokana He is somewhat better with his diet and activity level and this may be helping However does not check readings after meals and generally may be having high readings after meals Fasting readings are mostly high probably averaging about 130+  No hypoglycemia also  Essential hypertension: This has been controlled with 10 mg lisinopril   PLAN:    Glucose monitoring: Check readings after evening meal periodically and not just in the morning  He will stay on the same medication regimen  Follow-up in 4 months unless blood sugars are getting higher   There are no Patient Instructions on file for this visit.          Elayne Snare 09/19/2018, 9:05 PM   Note: This office note was prepared with Dragon voice recognition system technology. Any transcriptional errors that result from this process are unintentional.

## 2018-09-20 ENCOUNTER — Other Ambulatory Visit: Payer: Self-pay

## 2018-09-20 ENCOUNTER — Encounter: Payer: Self-pay | Admitting: Endocrinology

## 2018-09-20 ENCOUNTER — Ambulatory Visit (INDEPENDENT_AMBULATORY_CARE_PROVIDER_SITE_OTHER): Payer: Medicare Other | Admitting: Endocrinology

## 2018-09-20 DIAGNOSIS — E1169 Type 2 diabetes mellitus with other specified complication: Secondary | ICD-10-CM | POA: Diagnosis not present

## 2018-09-20 DIAGNOSIS — E669 Obesity, unspecified: Secondary | ICD-10-CM

## 2018-09-20 DIAGNOSIS — E119 Type 2 diabetes mellitus without complications: Secondary | ICD-10-CM

## 2018-09-24 ENCOUNTER — Encounter: Payer: Self-pay | Admitting: Family Medicine

## 2018-09-24 ENCOUNTER — Other Ambulatory Visit: Payer: Self-pay

## 2018-09-24 ENCOUNTER — Ambulatory Visit (INDEPENDENT_AMBULATORY_CARE_PROVIDER_SITE_OTHER): Payer: Medicare Other | Admitting: Family Medicine

## 2018-09-24 DIAGNOSIS — M069 Rheumatoid arthritis, unspecified: Secondary | ICD-10-CM | POA: Diagnosis not present

## 2018-09-24 DIAGNOSIS — E785 Hyperlipidemia, unspecified: Secondary | ICD-10-CM

## 2018-09-24 DIAGNOSIS — E1159 Type 2 diabetes mellitus with other circulatory complications: Secondary | ICD-10-CM

## 2018-09-24 DIAGNOSIS — I10 Essential (primary) hypertension: Secondary | ICD-10-CM | POA: Diagnosis not present

## 2018-09-24 DIAGNOSIS — E1169 Type 2 diabetes mellitus with other specified complication: Secondary | ICD-10-CM | POA: Diagnosis not present

## 2018-09-24 DIAGNOSIS — I152 Hypertension secondary to endocrine disorders: Secondary | ICD-10-CM

## 2018-09-24 NOTE — Progress Notes (Signed)
Virtual Visit via Video Note  I connected with Raymond Vasquez on 09/24/18 at  9:00 AM EDT by a video enabled telemedicine application and verified that I am speaking with the correct person using two identifiers.  Location patient: home Location provider:work office Persons participating in the virtual visit: patient, provider  I discussed the limitations of evaluation and management by telemedicine and the availability of in person appointments. The patient expressed understanding and agreed to proceed.   Raymond Vasquez DOB: [birth year] Encounter date: 09/24/2018  This is a 68 y.o. male who presents to establish care. Chief Complaint  Patient presents with  . Establish Care    transfer from Dr Jolene Schimke [facility] for annual review 07/2018 - discussed shingrix, increasing exercise, healthy eating.  History of present illness:  IDP:OEUMPNTIRW recently decreased to 5mg  daily. Diabetes: follows with Dr. Dwyane Dee - most recent A1C at 6.6. metformin 1g BID, Amaryl 4mg  daily, Trulicity 1.5mg  weekly, invokana 100mg  daily.  Hemorrhoids: no longer an issue for him. Creams did clear this up for him.   OSA: He hasn't worn machine in years. Had lap band done about 7 years ago and went from 270lb to 210-220. Wife hasn't had to elbow him.   HL: on crestor 20mg  daily.  RA: follows with rheumatology (Dr. Kelton Pillar); on mtx. Only thing he takes is aleve for pain. Has to take with something on stomach and doesn't like milk. He prefers not to take pain medication. He does get relief from the aleve. Has lost significant strength in arms due to arthritis. Prednisone was only thing that was helping with pain for awhile, but now working with doc to manage on methotrexat. Wearing braces on both hands at night. But pain is at tolerable level. He can't work on yard all day like he used to. Can drives, ride motorcycle.   127/87 pressure this morning. His glucose was 182 this morning. On the 3rd sugar was in 300's. No  explanation for this. States that he eats the same; pretty stable. No sweets. Not sure why this happens. On the 2nd was 170 on the 1st 106. On 30th 374. Tries to remind self to take evening sugars. 29th 141; usually between 130-140's. Feels like the big spikes came out of nowhere. (notes after consideration that he does occasionally take the aleve with chocolate milk).   Repeat colonoscopy due 10/2020.   Past Medical History:  Diagnosis Date  . Angioedema    he is unsure of this or cause  . 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 Diabetes mellitus type II, controlled, with no complications ([place])   . Hemorrhoids   . 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 Hyperlipidemia   . Hypertension   . Nephrolithiasis    1980s  . OSA (obstructive sleep apnea)   . PMR (polymyalgia rheumatica) ([facility])   . Shingles    2014   Past Surgical History:  Procedure Laterality Date  . APPENDECTOMY    . FINGER SURGERY  06/2012  . LAPAROSCOPIC GASTRIC BANDING  08/10/2009   No Known Allergies Current Meds  Medication Sig  . Dulaglutide (TRULICITY) 1.5 ER/1.5QM SOPN Inject 1.5 mg into the skin once a week. Inject weekly  . glimepiride (AMARYL) 4 MG tablet Take 1 tablet (4 mg total) by mouth daily with breakfast.  . INVOKANA 100 MG TABS tablet TAKE 1 TABLET BY MOUTH BEFORE BREAKFAST  . lisinopril (PRINIVIL,ZESTRIL) 5 MG tablet Take 1 tablet (5 mg total) by mouth daily.  Marland Kitchen  metFORMIN (GLUCOPHAGE) 1000 MG tablet TAKE 1 TABLET BY MOUTH TWICE DAILY WITH MEAL  . Methotrexate Sodium (METHOTREXATE, PF,) 50 MG/2ML injection INJECT 1 ML ONCE A WEEK  . ONETOUCH DELICA LANCETS FINE MISC Use as instructed by provider  . ONETOUCH VERIO test strip CHECK BLOOD SUGAR THREE TIMES DAILY  . rosuvastatin (CRESTOR) 20 MG tablet TAKE 1 TABLET(20 MG) BY MOUTH DAILY   Current Facility-Administered Medications for the 09/24/18 encounter (Office Visit) with Caren Macadam, MD  Medication  . 0.9 %  sodium  chloride infusion  . 0.9 %  sodium chloride infusion   Social History   Tobacco Use  . Smoking status: Current Some Day Smoker    Types: Cigars  . Smokeless tobacco: Current User    Types: Chew  . Tobacco comment: 1 cigar occassionally, not more then 1x per week  Substance Use Topics  . Alcohol use: No   Family History  Problem Relation Age of Onset  . Heart disease Mother   . Diabetes Maternal Grandmother      Review of Systems  Constitutional: Negative for chills, fatigue and fever.  Respiratory: Negative for cough, chest tightness, shortness of breath and wheezing.   Cardiovascular: Negative for chest pain, palpitations and leg swelling.  Musculoskeletal: Positive for arthralgias.    Objective:  There were no vitals taken for this visit.      BP Readings from Last 3 Encounters:  07/24/18 96/64  06/15/18 124/84  05/01/18 90/72   Wt Readings from Last 3 Encounters:  07/24/18 205 lb (93 kg)  06/15/18 206 lb 12.8 oz (93.8 kg)  05/01/18 204 lb 6.4 oz (92.7 kg)    EXAM:  GENERAL: alert, oriented, appears well and in no acute distress  HEENT: atraumatic, conjunctiva clear, no obvious abnormalities on inspection of external nose and ears  NECK: normal movements of the head and neck  LUNGS: on inspection no signs of respiratory distress, breathing rate appears normal, no obvious gross SOB, gasping or wheezing  CV: no obvious cyanosis  MS: moves all visible extremities without noticeable abnormality  PSYCH/NEURO: pleasant and cooperative, no obvious depression or anxiety, speech and thought processing grossly intact  SKIN: no skin abnormalities noted on face.   Assessment/Plan  1. Hypertension associated with diabetes ([place]) BP looking better with decrease in lisinopril. He will continue to monitor.   2. Hyperlipidemia associated with type 2 diabetes mellitus ([place]) Stable on crestor.   3. Rheumatoid arthritis involving multiple sites, unspecified  rheumatoid factor presence [facility]) Following with rheumatology. Doing much better with pain overall.   4. Type 2 diabetes mellitus with other specified complication, without long-term current use of insulin ([facility]) Well controlled. Watching diet and checking sugars regularly.     Return in about 6 months (around 03/27/2019), or CCV/AWV.   I discussed the assessment and treatment plan with the patient. The patient was provided an opportunity to ask questions and all were answered. The patient agreed with the plan and demonstrated an understanding of the instructions.   The patient was advised to call back or seek an in-person evaluation if the symptoms worsen or if the condition fails to improve as anticipated.  I provided 30 minutes of non-face-to-face time during this encounter.   Micheline Rough, MD

## 2018-09-25 ENCOUNTER — Ambulatory Visit: Payer: Medicare Other | Admitting: Family Medicine

## 2018-10-14 ENCOUNTER — Other Ambulatory Visit: Payer: Self-pay | Admitting: Endocrinology

## 2018-11-05 ENCOUNTER — Other Ambulatory Visit: Payer: Self-pay | Admitting: Family Medicine

## 2018-12-19 ENCOUNTER — Other Ambulatory Visit: Payer: Self-pay | Admitting: Endocrinology

## 2019-01-08 ENCOUNTER — Other Ambulatory Visit: Payer: Self-pay | Admitting: Family Medicine

## 2019-01-14 ENCOUNTER — Other Ambulatory Visit (INDEPENDENT_AMBULATORY_CARE_PROVIDER_SITE_OTHER): Payer: Medicare Other

## 2019-01-14 ENCOUNTER — Other Ambulatory Visit: Payer: Self-pay

## 2019-01-14 DIAGNOSIS — E1169 Type 2 diabetes mellitus with other specified complication: Secondary | ICD-10-CM | POA: Diagnosis not present

## 2019-01-14 DIAGNOSIS — E669 Obesity, unspecified: Secondary | ICD-10-CM | POA: Diagnosis not present

## 2019-01-14 LAB — COMPREHENSIVE METABOLIC PANEL
ALT: 25 U/L (ref 0–53)
AST: 18 U/L (ref 0–37)
Albumin: 4.9 g/dL (ref 3.5–5.2)
Alkaline Phosphatase: 37 U/L — ABNORMAL LOW (ref 39–117)
BUN: 13 mg/dL (ref 6–23)
CO2: 31 mEq/L (ref 19–32)
Calcium: 9.7 mg/dL (ref 8.4–10.5)
Chloride: 104 mEq/L (ref 96–112)
Creatinine, Ser: 0.84 mg/dL (ref 0.40–1.50)
GFR: 91.47 mL/min (ref >60.00)
Glucose, Bld: 153 mg/dL — ABNORMAL HIGH (ref 70–99)
Potassium: 4.2 mEq/L (ref 3.5–5.1)
Sodium: 141 mEq/L (ref 135–145)
Total Bilirubin: 0.3 mg/dL (ref 0.2–1.2)
Total Protein: 6.7 g/dL (ref 6.0–8.3)

## 2019-01-14 LAB — MICROALBUMIN / CREATININE URINE RATIO
Creatinine,U: 73.3 mg/dL
Microalb Creat Ratio: 1 mg/g (ref 0.0–30.0)
Microalb, Ur: <0.7 mg/dL (ref 0.0–1.9)

## 2019-01-14 LAB — HEMOGLOBIN A1C: Hgb A1c MFr Bld: 7.1 % — ABNORMAL HIGH (ref 4.6–6.5)

## 2019-01-17 ENCOUNTER — Ambulatory Visit: Payer: Medicare Other | Admitting: Endocrinology

## 2019-01-18 ENCOUNTER — Telehealth: Payer: Self-pay | Admitting: Endocrinology

## 2019-01-18 ENCOUNTER — Ambulatory Visit (INDEPENDENT_AMBULATORY_CARE_PROVIDER_SITE_OTHER): Payer: Medicare Other | Admitting: Endocrinology

## 2019-01-18 ENCOUNTER — Other Ambulatory Visit: Payer: Self-pay

## 2019-01-18 ENCOUNTER — Encounter: Payer: Self-pay | Admitting: Endocrinology

## 2019-01-18 DIAGNOSIS — E1165 Type 2 diabetes mellitus with hyperglycemia: Secondary | ICD-10-CM

## 2019-01-18 MED ORDER — CANAGLIFLOZIN 300 MG PO TABS: 300.0000 mg | ORAL_TABLET | Freq: Every day | ORAL | 3 refills | Status: DC

## 2019-01-18 NOTE — Progress Notes (Signed)
Patient ID: Raymond Vasquez, male   DOB: [birth year], 68 y.o.   MRN: [id number]          Reason for Appointment: Follow-up for Type 2 Diabetes   Today's office visit was provided via telemedicine using video technique Explained to the patient and the the limitations of evaluation and management by telemedicine and the availability of in person appointments.  The patient understood the limitations and agreed to proceed. Patient also understood that the telehealth visit is billable. . Location of the patient: Home . Location of the provider: Office Only the patient and myself were participating in the encounter     History of Present Illness:          Date of diagnosis of type 2 diabetes mellitus: 2003 approximately       Recent history:   His A1c had come down to 6.6, now back up again to 7.1  Non-insulin hypoglycemic drugs the patient is taking are: Metformin 1 g twice daily, Amaryl 4 mg in a.m., Trulicity 1.5 mg weekly, Invokana 100 mg daily  Current management, blood sugar patterns and problems identified:  He has checked some blood sugars only fasting and again forgets to check readings after meals as directed  His medications were not changed on the last visit  Unclear whether he has lost any weight or not  He still has limitations with his activity level and not able to find a way to exercise  Previously has been told to be consistent with diet and avoid sweets and regular soft drinks and he usually is trying to do this  He takes his medications including Trulicity consistently as prescribed  Lab glucose was 153 fasting  No hypoglycemia symptoms with Amaryl  No side effects with Trulicity which he takes regularly       Side effects from medications have been: None    Typical meal intake: Breakfast is usually skipped, sometimes juice and muffin               Exercise:  none  Glucose monitoring:  done 1 times a day         Glucometer: One Touch .       Blood  Glucose readings by patient review  RECENT fasting range 103-173 with MEDIAN 140  PREVIOUS range 121-268   Dietician visit, most recent: Before his lap band surgery  Weight history: Maximum 280 in the past  Wt Readings from Last 3 Encounters:  07/24/18 205 lb (93 kg)  06/15/18 206 lb 12.8 oz (93.8 kg)  05/01/18 204 lb 6.4 oz (92.7 kg)    Glycemic control:   Lab Results  Component Value Date   HGBA1C 7.1 (H) 01/14/2019   HGBA1C 6.6 (H) 09/17/2018   HGBA1C 7.1 (H) 06/12/2018   Lab Results  Component Value Date   MICROALBUR <0.7 01/14/2019   LDLCALC 45 07/24/2018   CREATININE 0.84 01/14/2019   Lab Results  Component Value Date   MICRALBCREAT 1.0 01/14/2019    Lab Results  Component Value Date   FRUCTOSAMINE 281 03/22/2018    Lab on 01/14/2019  Component Date Value Ref Range Status  . Microalb, Ur 01/14/2019 <0.7  0.0 - 1.9 mg/dL Final  . Creatinine,U 01/14/2019 73.3  mg/dL Final  . Microalb Creat Ratio 01/14/2019 1.0  0.0 - 30.0 mg/g Final  . Sodium 01/14/2019 141  135 - 145 mEq/L Final  . Potassium 01/14/2019 4.2  3.5 - 5.1 mEq/L Final  . Chloride 01/14/2019 104  96 - 112 mEq/L Final  . CO2 01/14/2019 31  19 - 32 mEq/L Final  . Glucose, Bld 01/14/2019 153* 70 - 99 mg/dL Final  . BUN 01/14/2019 13  6 - 23 mg/dL Final  . Creatinine, Ser 01/14/2019 0.84  0.40 - 1.50 mg/dL Final  . Total Bilirubin 01/14/2019 0.3  0.2 - 1.2 mg/dL Final  . Alkaline Phosphatase 01/14/2019 37* 39 - 117 U/L Final  . AST 01/14/2019 18  0 - 37 U/L Final  . ALT 01/14/2019 25  0 - 53 U/L Final  . Total Protein 01/14/2019 6.7  6.0 - 8.3 g/dL Final  . Albumin 01/14/2019 4.9  3.5 - 5.2 g/dL Final  . Calcium 01/14/2019 9.7  8.4 - 10.5 mg/dL Final  . GFR 01/14/2019 91.47  >60.00 mL/min Final  . Hgb A1c MFr Bld 01/14/2019 7.1* 4.6 - 6.5 % Final   Glycemic Control Guidelines for People with Diabetes:Non Diabetic:  <6%Goal of Therapy: <7%Additional Action Suggested:  >8%     Allergies as of  01/18/2019   No Known Allergies     Medication List       Accurate as of January 18, 2019  8:09 AM. If you have any questions, ask your nurse or doctor.        glimepiride 4 MG tablet Commonly known as: AMARYL TAKE 1 TABLET BY MOUTH DAILY WITH BREAKFAST   Invokana 100 MG Tabs tablet Generic drug: canagliflozin TAKE 1 TABLET BY MOUTH BEFORE BREAKFAST   lisinopril 5 MG tablet Commonly known as: ZESTRIL Take 1 tablet (5 mg total) by mouth daily.   metFORMIN 1000 MG tablet Commonly known as: GLUCOPHAGE TAKE 1 TABLET BY MOUTH TWICE DAILY WITH MEAL   methotrexate (PF) 50 MG/2ML injection INJECT 1 ML ONCE A WEEK   OneTouch Delica Lancets Fine Misc Use as instructed by provider   OneTouch Verio test strip Generic drug: glucose blood Check blood sugar once a day alternating fasting and 2 hours after meal   rosuvastatin 20 MG tablet Commonly known as: CRESTOR TAKE 1 TABLET(20 MG) BY MOUTH DAILY   Trulicity 1.5 0000000 Sopn Generic drug: Dulaglutide INJECT 1.5MG  INTO THE SKIN ONCE A WEEK       Allergies: No Known Allergies  Past Medical History:  Diagnosis Date  . Angioedema    he is unsure of this or cause  . 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 Diabetes mellitus type II, controlled, with no complications ([place])   . Hemorrhoids   . 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 Hyperlipidemia   . Hypertension   . Nephrolithiasis    1980s  . OSA (obstructive sleep apnea)   . PMR (polymyalgia rheumatica) ([facility])   . Shingles    2014    Past Surgical History:  Procedure Laterality Date  . APPENDECTOMY    . FINGER SURGERY  06/2012  . LAPAROSCOPIC GASTRIC BANDING  08/10/2009    Family History  Problem Relation Age of Onset  . Heart disease Mother   . Diabetes Maternal Grandmother     Social History:  reports that he has been smoking cigars. His smokeless tobacco use includes chew. He reports that he does not drink alcohol or use drugs.    Review of Systems   Lipid history: Followed by PCP, has adequate levels on Crestor 20 mg daily    Lab Results  Component Value Date   CHOL 107 07/24/2018   HDL 36.10 (L) 07/24/2018   LDLCALC 45 07/24/2018  LDLDIRECT 154.1 02/16/2010   TRIG 130.0 07/24/2018   CHOLHDL 3 07/24/2018           Hypertension: Has been present, currently on only 5 mg lisinopril  Has not checked blood pressure at home recently   BP Readings from Last 3 Encounters:  07/24/18 96/64  06/15/18 124/84  05/01/18 90/72    Most recent eye exam was in 2020, negative for retinopathy  Most recent foot exam: 10/19  Currently known complications of diabetes: None, needs urine microalbumin checked and follow-up eye exam  He is on methotrexate injections for his rheumatoid arthritis  LABS:  Lab on 01/14/2019  Component Date Value Ref Range Status  . Microalb, Ur 01/14/2019 <0.7  0.0 - 1.9 mg/dL Final  . Creatinine,U 01/14/2019 73.3  mg/dL Final  . Microalb Creat Ratio 01/14/2019 1.0  0.0 - 30.0 mg/g Final  . Sodium 01/14/2019 141  135 - 145 mEq/L Final  . Potassium 01/14/2019 4.2  3.5 - 5.1 mEq/L Final  . Chloride 01/14/2019 104  96 - 112 mEq/L Final  . CO2 01/14/2019 31  19 - 32 mEq/L Final  . Glucose, Bld 01/14/2019 153* 70 - 99 mg/dL Final  . BUN 01/14/2019 13  6 - 23 mg/dL Final  . Creatinine, Ser 01/14/2019 0.84  0.40 - 1.50 mg/dL Final  . Total Bilirubin 01/14/2019 0.3  0.2 - 1.2 mg/dL Final  . Alkaline Phosphatase 01/14/2019 37* 39 - 117 U/L Final  . AST 01/14/2019 18  0 - 37 U/L Final  . ALT 01/14/2019 25  0 - 53 U/L Final  . Total Protein 01/14/2019 6.7  6.0 - 8.3 g/dL Final  . Albumin 01/14/2019 4.9  3.5 - 5.2 g/dL Final  . Calcium 01/14/2019 9.7  8.4 - 10.5 mg/dL Final  . GFR 01/14/2019 91.47  >60.00 mL/min Final  . Hgb A1c MFr Bld 01/14/2019 7.1* 4.6 - 6.5 % Final   Glycemic Control Guidelines for People with Diabetes:Non Diabetic:  <6%Goal of Therapy: <7%Additional Action Suggested:   >8%     Physical Examination:  There were no vitals taken for this visit.     ASSESSMENT:  Diabetes type 2    See history of present illness for detailed discussion of current diabetes management, blood sugar patterns and problems identified  His A1c is back up to 7.1  Currently on multiple drugs including Trulicity and Invokana With only monitoring fasting blood sugar not clear where his blood sugars are running higher  As before has difficulty with exercise He thinks he is doing reasonably well on the diet However gained has difficulty losing weight  No hypoglycemia currently with Amaryl  Mild hypertension: Unable to verify his blood pressure readings recently   PLAN:    Glucose monitoring: Emphasized the need to check blood sugars nonfasting especially after dinner and also randomly during the day Encouraged him to try and be more active with whatever activities he can do  Invokana 300 mg daily instead of 100 mg, he can finish out what he has with doubling the dose and then start the new prescription with 1 tablet in the morning before breakfast At the same time he will stop lisinopril Cut Amaryl in half as a precaution and discussed that if he has high readings later in the day may need to go back up on the dose Discussed target of less than 7 for his A1c Follow-up and recheck renal function, would prefer an office visit to check blood pressure on the next  visit   There are no Patient Instructions on file for this visit.       Elayne Snare 01/18/2019, 8:09 AM   Note: This office note was prepared with Dragon voice recognition system technology. Any transcriptional errors that result from this process are unintentional.

## 2019-01-18 NOTE — Telephone Encounter (Signed)
Pt came and dropped off meter for down load never came back to pick up. Its in zip lock bag up front in cabinet. Thanks

## 2019-01-21 NOTE — Telephone Encounter (Signed)
Please call pt and inform his of this.

## 2019-02-28 ENCOUNTER — Ambulatory Visit (INDEPENDENT_AMBULATORY_CARE_PROVIDER_SITE_OTHER): Payer: Medicare Other | Admitting: Family Medicine

## 2019-02-28 ENCOUNTER — Encounter: Payer: Self-pay | Admitting: Family Medicine

## 2019-02-28 ENCOUNTER — Other Ambulatory Visit: Payer: Self-pay

## 2019-02-28 DIAGNOSIS — E1169 Type 2 diabetes mellitus with other specified complication: Secondary | ICD-10-CM

## 2019-02-28 DIAGNOSIS — M069 Rheumatoid arthritis, unspecified: Secondary | ICD-10-CM | POA: Diagnosis not present

## 2019-02-28 DIAGNOSIS — Z0000 Encounter for general adult medical examination without abnormal findings: Secondary | ICD-10-CM | POA: Diagnosis not present

## 2019-02-28 DIAGNOSIS — E1159 Type 2 diabetes mellitus with other circulatory complications: Secondary | ICD-10-CM

## 2019-02-28 DIAGNOSIS — Z6830 Body mass index (BMI) 30.0-30.9, adult: Secondary | ICD-10-CM

## 2019-02-28 DIAGNOSIS — I10 Essential (primary) hypertension: Secondary | ICD-10-CM

## 2019-02-28 DIAGNOSIS — I152 Hypertension secondary to endocrine disorders: Secondary | ICD-10-CM

## 2019-02-28 NOTE — Progress Notes (Signed)
Medicare Annual Preventive Care Visit  (initial annual wellness or annual wellness exam)  Virtual Visit via Video Note  I connected with Jaun  on 10/09/18 at  3:20 PM EDT by a video enabled telemedicine application and verified that I am speaking with the correct person using two identifiers.  Location patient: home Location provider:work or home office Persons participating in the virtual visit: patient, provider  Concerns and/or follow up today:  Iverson Alamin is a pleasant 68 yo with a PMH significant for DM (sees endo for management), HTN, HLD, OSA, RA (sees rheumatologist for management) seen for his AWV today. Recently did labs within 90 days and Hgba1c was 7.1. Working on diet. No regular exercise. Seeing ortho for shoulder - not RTC, has OA.  He is due for a foot exam, flu shot and prostate ca screening. He reports he is enjoying being in retirement. Has been very cautious with the pandemic.   See HM section in Epic for other details of completed HM. See scanned documentation under Media Tab for further documentation HPI, health risk assessment. See Media Tab and Care Teams sections in Epic for other providers.  ROS: negative for report of fevers, unintentional weight loss, vision changes, vision loss, hearing loss or change, chest pain, sob, hemoptysis, melena, hematochezia, hematuria, genital discharge or lesions, falls, bleeding or bruising, loc, thoughts of suicide or self harm, memory loss  1.) Patient-completed health risk assessment  - completed and reviewed, see scanned documentation  2.) Review of Medical History: -PMH, PSH, Family History and current specialty and care providers reviewed and updated and listed below  - see scanned in document in chart and below Patient Care Team: Caren Macadam, MD as PCP - General (Family Medicine) Elayne Snare, MD as Consulting Physician (Endocrinology) Marvene Staff (Family Medicine)   Past Medical History:  Diagnosis Date  .  Angioedema    he is unsure of this or cause  . 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 Diabetes mellitus type II, controlled, with no complications ([place])   . Hemorrhoids   . 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 Hyperlipidemia   . Hypertension   . Nephrolithiasis    1980s  . OSA (obstructive sleep apnea)   . PMR (polymyalgia rheumatica) ([facility])   . Shingles    2014    Past Surgical History:  Procedure Laterality Date  . APPENDECTOMY    . FINGER SURGERY  06/2012  . LAPAROSCOPIC GASTRIC BANDING  08/10/2009    Social History   Socioeconomic History  . Marital status: Married    Spouse name: Not on file  . Number of children: Not on file  . Years of education: Not on file  . Highest education level: Not on file  Occupational History  . Not on file  Social Needs  . Financial resource strain: Not on file  . Food insecurity    Worry: Not on file    Inability: Not on file  . Transportation needs    Medical: Not on file    Non-medical: Not on file  Tobacco Use  . Smoking status: Current Some Day Smoker    Types: Cigars  . Smokeless tobacco: Current User    Types: Chew  . Tobacco comment: 1 cigar occassionally, not more then 1x per week  Substance and Sexual Activity  . Alcohol use: No  . Drug use: No  . Sexual activity: Not on file  Lifestyle  . Physical activity    Days per  week: Not on file    Minutes per session: Not on file  . Stress: Not on file  Relationships  . Social Herbalist on phone: Not on file    Gets together: Not on file    Attends religious service: Not on file    Active member of club or organization: Not on file    Attends meetings of clubs or organizations: Not on file    Relationship status: Not on file  . Intimate partner violence    Fear of current or ex partner: Not on file    Emotionally abused: Not on file    Physically abused: Not on file    Forced sexual activity: Not on file  Other Topics Concern  .  Not on file  Social History Narrative   Work or School: retired Armed forces technical officer with [employer] Situation: lives with wifes      Spiritual Beliefs: none      Lifestyle: no regular exercise, healthy diet       Family History  Problem Relation Age of Onset  . Heart disease Mother   . Diabetes Maternal Grandmother     Current Outpatient Medications on File Prior to Visit  Medication Sig Dispense Refill  . canagliflozin (INVOKANA) 300 MG TABS tablet Take 1 tablet (300 mg total) by mouth daily before breakfast. 30 tablet 3  . diclofenac (VOLTAREN) 75 MG EC tablet Take 75 mg by mouth 2 (two) times daily.     Marland Kitchen glimepiride (AMARYL) 4 MG tablet TAKE 1 TABLET BY MOUTH DAILY WITH BREAKFAST (Patient taking differently: 2 mg. ) 90 tablet 1  . glucose blood (ONETOUCH VERIO) test strip Check blood sugar once a day alternating fasting and 2 hours after meal 50 each 1  . metFORMIN (GLUCOPHAGE) 1000 MG tablet TAKE 1 TABLET BY MOUTH TWICE DAILY WITH MEAL 180 tablet 1  . Methotrexate Sodium (METHOTREXATE, PF,) 50 MG/2ML injection INJECT 1 ML ONCE A WEEK    . ONETOUCH DELICA LANCETS FINE MISC Use as instructed by provider 200 each 3  . rosuvastatin (CRESTOR) 20 MG tablet TAKE 1 TABLET(20 MG) BY MOUTH DAILY 90 tablet 1  . TRULICITY 1.5 0000000 SOPN INJECT 1.5MG  INTO THE SKIN ONCE A WEEK 4 pen 3   Current Facility-Administered Medications on File Prior to Visit  Medication Dose Route Frequency Provider Last Rate Last Dose  . 0.9 %  sodium chloride infusion  500 mL Intravenous Once Pyrtle, Lajuan Lines, MD      . 0.9 %  sodium chloride infusion  500 mL Intravenous Once Pyrtle, Lajuan Lines, MD         3.) Review of functional ability and level of safety:  Any difficulty hearing?  See scanned documentation  History of falling?  See scanned documentation  Any trouble with IADLs - using a phone, using transportation, grocery shopping, preparing meals, doing housework, doing laundry, taking  medications and managing money?  See scanned documentation  Advance Directives?  Discussed briefly and offered more resources. He is thinking about this - will have staff send him more information about this.  See summary of recommendations in Patient Instructions below.  4.) Physical Exam There were no vitals filed for this visit. Estimated body mass index is 29.21 kg/m as calculated from the following:   Height as of 07/24/18: 5' 10.25" (1.784 m).   Weight as of 07/24/18: 205 lb (93 kg). BP: 138/70  EKG (optional): deferred  VITALS per patient if applicable: BP 123456  GENERAL: alert, oriented, appears well and in no acute distress; visual acuity grossly intact, full vision exam deferred due to pandemic and/or virtual encounter  HEENT: atraumatic, conjunttiva clear, no obvious abnormalities on inspection of external nose and ears  NECK: normal movements of the head and neck  LUNGS: on inspection no signs of respiratory distress, breathing rate appears normal, no obvious gross SOB, gasping or wheezing  CV: no obvious cyanosis  MS: moves all visible extremities without noticeable abnormality  PSYCH/NEURO: pleasant and cooperative, no obvious depression or anxiety, speech and thought processing grossly intact, Cognitive function grossly intact  Depression screen [facility] 2/9 02/28/2019 07/24/2018 07/24/2018 03/30/2015  Decreased Interest 0 0 0 0  Down, Depressed, Hopeless 0 0 0 0  PHQ - 2 Score 0 0 0 0  Altered sleeping - 0 - -  Tired, decreased energy - 1 - -  Change in appetite - 0 - -  Feeling bad or failure about yourself  - 0 - -  Trouble concentrating - 0 - -  Moving slowly or fidgety/restless - 0 - -  Suicidal thoughts - 0 - -  PHQ-9 Score - 1 - -     See patient instructions for recommendations.  Education and counseling regarding the above review of health provided with a plan for the following: -see scanned patient completed form for further details -healthy lifestyle  discussed -importance and resources for completing advanced directives discussed -see patient instructions below for any other recommendations provided  4)The following written screening schedule of preventive measures were reviewed with assessment and plan made per below, orders and patient instructions:      AAA screening done if applicable     Alcohol screening done     Obesity Screening and counseling done     STI screening (Hep C if born [birth year]) offered and per pt wishes     Tobacco Screening done        Pneumococcal (PPSV23 -one dose after 64, one before if risk factors), influenza yearly and hepatitis B vaccines (if high risk - end stage renal disease, IV drugs, homosexual men, live in home for mentally retarded, hemophilia receiving factors) ASSESSMENT/PLAN: done if applicable      Prostate cancer screening ASSESSMENT/PLAN: did PSA in March of 2020      Colorectal cancer screening (FOBT yearly or flex sig q4y or colonoscopy q10y or barium enema q4y) ASSESSMENT/PLAN: utd. Done in 2019      Diabetes outpatient self-management training services ASSESSMENT/PLAN: sees diabetes specialist      Screening for glaucoma(q1y if high risk - diabetes, FH, AA and > 50 or hispanic and > 65) ASSESSMENT/PLAN: utd per patient, had a visit the beginning of this [place] nutritional therapy for individuals with diabetes or renal disease ASSESSMENT/PLAN: sees diabetes specialist      Cardiovascular screening blood tests (lipids q5y) ASSESSMENT/PLAN: done 07/3028      Diabetes screening tests ASSESSMENT/PLAN: done 12/2018   7.) Summary:   Medicare annual wellness visit, subsequent -risk factors and conditions per above assessment were discussed and treatment, recommendations and referrals were offered per documentation above and orders and patient instructions. -he plans to get his flu and shingles shots later this month and when  -flu shot with endo appt or with Dr.  Ethlyn Gallery  Hypertension associated with diabetes [facility]) -discussed goal of 120/70. He is a little above goal today and lisinipril  stopped by his endocrinologist. He agrees to monitor BP at home and get an average to discuss with Endo or upcoming PCP appt to see if treatment needed. -advised increased exercise  Type 2 diabetes mellitus with other specified complication, without long-term current use of insulin ([place]) -see endo for management, working on diet, advised adding exercise as well  Rheumatoid arthritis involving multiple sites, unspecified whether rheumatoid factor present ([place]) -sees specialist for management  BMI 30.0-30.9,adult -resolved, now BMI < 30, working on diet, advised adding regular exercise  There are no Patient Instructions on file for this visit.  Lucretia Kern, DO

## 2019-02-28 NOTE — Patient Instructions (Signed)
  Raymond Vasquez , Thank you for taking time to come for your Medicare Wellness Visit. I appreciate your ongoing commitment to your health goals. Please review the following plan we discussed and let me know if I can assist you in the future.   These are the goals we discussed: Goals   Add regular exercise - walking 20-30 minutes 5 days per week would be a good initial goal  Get flu shot as planned within next 1 month  Get foot exam with diabetes appointment next week or with Dr. Ethlyn Gallery     This is a list of the screening recommended for you and due dates:  Health Maintenance  Topic Date Due  . Flu Shot  12/22/2018  . Complete foot exam   02/23/2019  . HIV Screening  11/19/2024*  . Eye exam for diabetics  06/27/2019  . Hemoglobin A1C  07/17/2019  . Pneumonia vaccines (2 of 2 - PPSV23) 11/20/2019  . Colon Cancer Screening  11/15/2020  . Tetanus Vaccine  07/13/2021  .  Hepatitis C: One time screening is recommended by Center for Disease Control  (CDC) for  adults born from [birth year] through 1963.   Completed  *Topic was postponed. The date shown is not the original due date.

## 2019-03-04 ENCOUNTER — Other Ambulatory Visit: Payer: Medicare Other

## 2019-03-05 ENCOUNTER — Other Ambulatory Visit: Payer: Self-pay | Admitting: Endocrinology

## 2019-03-05 DIAGNOSIS — E1165 Type 2 diabetes mellitus with hyperglycemia: Secondary | ICD-10-CM

## 2019-03-06 ENCOUNTER — Ambulatory Visit: Payer: Medicare Other | Admitting: Endocrinology

## 2019-03-07 ENCOUNTER — Other Ambulatory Visit (INDEPENDENT_AMBULATORY_CARE_PROVIDER_SITE_OTHER): Payer: Medicare Other

## 2019-03-07 ENCOUNTER — Other Ambulatory Visit: Payer: Self-pay

## 2019-03-07 DIAGNOSIS — E1165 Type 2 diabetes mellitus with hyperglycemia: Secondary | ICD-10-CM

## 2019-03-07 LAB — BASIC METABOLIC PANEL
BUN: 20 mg/dL (ref 6–23)
CO2: 31 mEq/L (ref 19–32)
Calcium: 10 mg/dL (ref 8.4–10.5)
Chloride: 101 mEq/L (ref 96–112)
Creatinine, Ser: 0.98 mg/dL (ref 0.40–1.50)
GFR: 76.53 mL/min (ref >60.00)
Glucose, Bld: 180 mg/dL — ABNORMAL HIGH (ref 70–99)
Potassium: 4.4 mEq/L (ref 3.5–5.1)
Sodium: 139 mEq/L (ref 135–145)

## 2019-03-08 LAB — FRUCTOSAMINE: Fructosamine: 263 umol/L (ref 0–285)

## 2019-03-13 ENCOUNTER — Ambulatory Visit: Payer: Medicare Other | Admitting: Endocrinology

## 2019-03-13 ENCOUNTER — Other Ambulatory Visit: Payer: Self-pay | Admitting: Endocrinology

## 2019-03-14 ENCOUNTER — Other Ambulatory Visit: Payer: Medicare Other

## 2019-03-19 ENCOUNTER — Ambulatory Visit: Payer: Medicare Other | Admitting: Endocrinology

## 2019-03-22 ENCOUNTER — Ambulatory Visit: Payer: Medicare Other | Admitting: Endocrinology

## 2019-04-02 NOTE — Progress Notes (Signed)
Raymond Vasquez DOB: [birth year] Encounter date: 04/03/2019  This is a 68 y.o. male who presents with Chief Complaint  Patient presents with  . Follow-up    History of present illness: Got two splinters in finger from outdoor work he would like looked at.   [id number] recently decreased to 5mg  daily.  Diabetes: follows with Dr. Dwyane Dee - most recent A1C at 6.6. metformin 1g BID, Amaryl 4mg  daily, Trulicity 1.5mg  weekly, invokana 100mg  daily.  HL: On Crestor 20 mg daily  Rheumatoid arthritis: Follows with Dr. Kelton Pillar, on methotrexate. Taking diclofenac for pain, which really works well. States sometimes right shoulder bothers him. Dx in June 2019; through retirement in November - attacked him so severely that he was really crippled. Feels like he can live with this this pain for now. At times he feels that he is restricted with mobility. If working shoulder too much, then he is "done". Sometimes throbs out of nowhere. Doc shamleffer gave him cortisone shot in shoulder which helped, but still flares now and then. Usually by early afternoon he is exhausted ("jammies by 1pm"). Shoulder has been bothering him since last year. Legs are doing much better with injection. Has lost about 40% of strength in arm and feels this affects whole body.   Repeat colonoscopy June 2022  Last A1C was 01/14/19.   No Known Allergies Current Meds  Medication Sig  . canagliflozin (INVOKANA) 300 MG TABS tablet Take 1 tablet (300 mg total) by mouth daily before breakfast.  . diclofenac (VOLTAREN) 75 MG EC tablet Take 75 mg by mouth 2 (two) times daily.   . folic acid (FOLVITE) 1 MG tablet Take 1 mg by mouth daily.  Marland Kitchen glimepiride (AMARYL) 4 MG tablet TAKE 1 TABLET BY MOUTH DAILY WITH BREAKFAST (Patient taking differently: 2 mg. )  . glucose blood (ONETOUCH VERIO) test strip CHECK BLOOD SUGAR ONCE A DAY ALTERNATING FASTING AND 2 HOURS AFTER A MEAL  . loratadine (CLARITIN) 10 MG tablet Take 10 mg by mouth  daily.  . metFORMIN (GLUCOPHAGE) 1000 MG tablet TAKE 1 TABLET BY MOUTH TWICE DAILY WITH MEAL  . Methotrexate Sodium (METHOTREXATE, PF,) 50 MG/2ML injection INJECT 1 ML ONCE A WEEK  . ONETOUCH DELICA LANCETS FINE MISC Use as instructed by provider  . rosuvastatin (CRESTOR) 20 MG tablet TAKE 1 TABLET(20 MG) BY MOUTH DAILY  . TRULICITY 1.5 0000000 SOPN INJECT 1.5MG  INTO THE SKIN ONCE A WEEK   Current Facility-Administered Medications for the 04/03/19 encounter (Office Visit) with Caren Macadam, MD  Medication  . 0.9 %  sodium chloride infusion  . 0.9 %  sodium chloride infusion    Review of Systems  Constitutional: Negative for chills, fatigue and fever.  Respiratory: Negative for cough, chest tightness, shortness of breath and wheezing.   Cardiovascular: Negative for chest pain, palpitations and leg swelling.    Objective:  BP 102/78 (BP Location: Left Arm, Patient Position: Sitting, Cuff Size: Large)   Pulse 77   Temp 97.7 F (36.5 C) (Temporal)   Ht 5' 10.25" (1.784 m)   Wt 203 lb 6.4 oz (92.3 kg)   SpO2 98%   BMI 28.98 kg/m   Weight: 203 lb 6.4 oz (92.3 kg)   BP Readings from Last 3 Encounters:  04/03/19 102/78  07/24/18 96/64  06/15/18 124/84   Wt Readings from Last 3 Encounters:  04/03/19 203 lb 6.4 oz (92.3 kg)  07/24/18 205 lb (93 kg)  06/15/18 206 lb 12.8 oz (93.8 kg)  Physical Exam Constitutional:      General: He is not in acute distress.    Appearance: He is well-developed.  Cardiovascular:     Rate and Rhythm: Normal rate and regular rhythm.     Heart sounds: Normal heart sounds. No murmur. No friction rub.  Pulmonary:     Effort: Pulmonary effort is normal. No respiratory distress.     Breath sounds: Normal breath sounds. No wheezing or rales.  Musculoskeletal:     Right lower leg: No edema.     Left lower leg: No edema.     Comments: He has tenderness anterior and lateral shoulder with palpation.  He is limited with full abduction secondary  to pain at about 100 degrees.  He has difficulty with posterior rotation of his right shoulder.  Positive Hawkins.  He does have some muscle mass loss in the right bicep, although strength is intact.  He does have some discomfort in the right shoulder with strength testing, but not obvious weakness.  He did have an x-ray of his shoulder through rheumatology already completed.  Neurological:     Mental Status: He is alert and oriented to person, place, and time.     Comments: Index finger left hand there are 2 pinpoint splinters in the fingertip.  After cleansing the skin with alcohol, a 22-gauge needle was used to open up the entry point and forceps was used to pull out foreign material.  One was easily removed.  The other is more difficult to see, but once entry point was opened up and some callus on that area lifted, patient no longer has sensation of foreign material in the finger.  Patient tolerated procedure well, and finger was dressed with antibiotic ointment and sterile dressing.  Psychiatric:        Behavior: Behavior normal.     Assessment/Plan  1. Hyperlipidemia, unspecified hyperlipidemia type Stable.  Continue Crestor.  2. Hypertension associated with diabetes ([place]) Stable.  Tolerating lisinopril 5 mg.  3. Rheumatoid arthritis involving multiple sites, unspecified whether rheumatoid factor present [facility]) Following with rheumatology regularly.  Joint pain improved control.  4. Chronic right shoulder pain Chronic right shoulder pain.  He would prefer not to have surgery or have to see an additional specialist if possible, but he is worried about the ongoing pain as well as some restriction in the shoulder and muscle loss.  I think it is reasonable to obtain an MRI and make sure there is nothing surgical happening within the shoulder that would warrant further evaluation with orthopedics. - MR Shoulder Right Wo Contrast; Future  5. Need for immunization against influenza - Flu  Vaccine QUAD High Dose(Fluad)  6. Splinter: Resolved today in office.     Micheline Rough, MD

## 2019-04-03 ENCOUNTER — Ambulatory Visit (INDEPENDENT_AMBULATORY_CARE_PROVIDER_SITE_OTHER): Payer: Medicare Other | Admitting: Family Medicine

## 2019-04-03 ENCOUNTER — Other Ambulatory Visit: Payer: Self-pay

## 2019-04-03 ENCOUNTER — Encounter: Payer: Self-pay | Admitting: Family Medicine

## 2019-04-03 VITALS — BP 102/78 | HR 77 | Temp 97.7°F | Ht 70.25 in | Wt 203.4 lb

## 2019-04-03 DIAGNOSIS — I10 Essential (primary) hypertension: Secondary | ICD-10-CM

## 2019-04-03 DIAGNOSIS — G8929 Other chronic pain: Secondary | ICD-10-CM

## 2019-04-03 DIAGNOSIS — M069 Rheumatoid arthritis, unspecified: Secondary | ICD-10-CM

## 2019-04-03 DIAGNOSIS — T148XXA Other injury of unspecified body region, initial encounter: Secondary | ICD-10-CM

## 2019-04-03 DIAGNOSIS — M25511 Pain in right shoulder: Secondary | ICD-10-CM

## 2019-04-03 DIAGNOSIS — E1159 Type 2 diabetes mellitus with other circulatory complications: Secondary | ICD-10-CM | POA: Diagnosis not present

## 2019-04-03 DIAGNOSIS — E785 Hyperlipidemia, unspecified: Secondary | ICD-10-CM

## 2019-04-03 DIAGNOSIS — Z23 Encounter for immunization: Secondary | ICD-10-CM

## 2019-04-03 DIAGNOSIS — I152 Hypertension secondary to endocrine disorders: Secondary | ICD-10-CM

## 2019-04-08 ENCOUNTER — Encounter: Payer: Self-pay | Admitting: Endocrinology

## 2019-04-08 ENCOUNTER — Ambulatory Visit (INDEPENDENT_AMBULATORY_CARE_PROVIDER_SITE_OTHER): Payer: Medicare Other | Admitting: Endocrinology

## 2019-04-08 ENCOUNTER — Other Ambulatory Visit: Payer: Self-pay

## 2019-04-08 VITALS — BP 130/70 | HR 89 | Ht 70.25 in | Wt 203.8 lb

## 2019-04-08 DIAGNOSIS — E1165 Type 2 diabetes mellitus with hyperglycemia: Secondary | ICD-10-CM | POA: Diagnosis not present

## 2019-04-08 LAB — POCT GLYCOSYLATED HEMOGLOBIN (HGB A1C): Hemoglobin A1C: 6.1 % — AB (ref 4.0–5.6)

## 2019-04-08 NOTE — Patient Instructions (Signed)
Check blood sugars on waking up 3-4 days a week  Also check blood sugars about 2 hours after meals and do this after different meals by rotation  Recommended blood sugar levels on waking up are 90-130 and about 2 hours after meal is 130-160  Please bring your blood sugar monitor to each visit, thank you  Reduce colas  Walk daily

## 2019-04-08 NOTE — Progress Notes (Signed)
Patient ID: Raymond Vasquez, male   DOB: 04-01-1951, 68 y.o.   MRN: [id number]          Reason for Appointment: Follow-up for Type 2 Diabetes     History of Present Illness:          Date of diagnosis of type 2 diabetes mellitus: 2003 approximately       Recent history:   His A1c has come down to 6.1, previously back up again to 7.1  Non-insulin hypoglycemic drugs the patient is taking are: Metformin 1 g twice daily, Amaryl 2 mg in a.m., Trulicity 1.5 mg weekly, Invokana 300 mg daily  Current management, blood sugar patterns and problems identified:  He has been taking 300 mg of Invokana since his last visit in August  Although he has only lost 2 pounds his A1c is surprisingly better  However he thinks he is trying to be a little more active in general with some walking  As before he forgets to do his blood sugars after meals  Morning sugars are excellent and usually averaging about 130 recently  Has had only one unusually high reading of 208  As before he is not able to control his intake of regular soft drinks and usually has an 8 ounce serving either at lunch or dinner and does not want to consider any other types of drinks  No side effects with increasing Invokana  He takes his diabetes medicines including Trulicity consistently as prescribed  Renal function stable       Side effects from medications have been: None    Typical meal intake: Breakfast is usually skipped, sometimes juice and muffin               Exercise:  walks a little  Glucose monitoring:  done 1 times a day         Glucometer: One Touch .       Blood Glucose readings   RECENT fasting range 112-208 AVERAGE 133  Previous range 103-173 with MEDIAN 140  No readings after meals   Dietician visit, most recent: Before his lap band surgery  Weight history: Maximum 280 in the past  Wt Readings from Last 3 Encounters:  04/08/19 203 lb 12.8 oz (92.4 kg)  04/03/19 203 lb 6.4 oz (92.3 kg)   07/24/18 205 lb (93 kg)    Glycemic control:   Lab Results  Component Value Date   HGBA1C 6.1 (A) 04/08/2019   HGBA1C 7.1 (H) 01/14/2019   HGBA1C 6.6 (H) 09/17/2018   Lab Results  Component Value Date   MICROALBUR <0.7 01/14/2019   LDLCALC 45 07/24/2018   CREATININE 0.98 03/07/2019   Lab Results  Component Value Date   MICRALBCREAT 1.0 01/14/2019    Lab Results  Component Value Date   FRUCTOSAMINE 263 03/07/2019   FRUCTOSAMINE 281 03/22/2018    Office Visit on 04/08/2019  Component Date Value Ref Range Status  . Hemoglobin A1C 04/08/2019 6.1* 4.0 - 5.6 % Final    Allergies as of 04/08/2019   No Known Allergies     Medication List       Accurate as of April 08, 2019 10:05 AM. If you have any questions, ask your nurse or doctor.        canagliflozin 300 MG Tabs tablet Commonly known as: Invokana Take 1 tablet (300 mg total) by mouth daily before breakfast.   diclofenac 75 MG EC tablet Commonly known as: VOLTAREN Take 75 mg by mouth 2 (  two) times daily.   folic acid 1 MG tablet Commonly known as: FOLVITE Take 1 mg by mouth daily.   glimepiride 4 MG tablet Commonly known as: AMARYL TAKE 1 TABLET BY MOUTH DAILY WITH BREAKFAST What changed:   how much to take  how to take this  when to take this   loratadine 10 MG tablet Commonly known as: CLARITIN Take 10 mg by mouth daily.   metFORMIN 1000 MG tablet Commonly known as: GLUCOPHAGE TAKE 1 TABLET BY MOUTH TWICE DAILY WITH MEAL   methotrexate (PF) 50 MG/2ML injection INJECT 1 ML ONCE A WEEK   OneTouch Delica Lancets Fine Misc Use as instructed by provider   OneTouch Verio test strip Generic drug: glucose blood CHECK BLOOD SUGAR ONCE A DAY ALTERNATING FASTING AND 2 HOURS AFTER A MEAL   rosuvastatin 20 MG tablet Commonly known as: CRESTOR TAKE 1 TABLET(20 MG) BY MOUTH DAILY   Trulicity 1.5 0000000 Sopn Generic drug: Dulaglutide INJECT 1.5MG  INTO THE SKIN ONCE A WEEK        Allergies: No Known Allergies  Past Medical History:  Diagnosis Date  . Angioedema    he is unsure of this or cause  . 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 Diabetes mellitus type II, controlled, with no complications ([place])   . Hemorrhoids   . 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 Hyperlipidemia   . Hypertension   . Nephrolithiasis    1980s  . OSA (obstructive sleep apnea)   . PMR (polymyalgia rheumatica) ([facility])   . Shingles    2014    Past Surgical History:  Procedure Laterality Date  . APPENDECTOMY    . FINGER SURGERY  06/2012  . LAPAROSCOPIC GASTRIC BANDING  08/10/2009    Family History  Problem Relation Age of Onset  . Heart disease Mother   . Diabetes Maternal Grandmother     Social History:  reports that he has been smoking cigars. His smokeless tobacco use includes chew. He reports that he does not drink alcohol or use drugs.   Review of Systems   Lipid history: Followed by PCP, has adequate levels on Crestor 20 mg daily    Lab Results  Component Value Date   CHOL 107 07/24/2018   HDL 36.10 (L) 07/24/2018   LDLCALC 45 07/24/2018   LDLDIRECT 154.1 02/16/2010   TRIG 130.0 07/24/2018   CHOLHDL 3 07/24/2018           Hypertension: Has been off medications with using Invokana Blood pressure was initially high in the office   BP Readings from Last 3 Encounters:  04/08/19 130/70  04/03/19 102/78  07/24/18 96/64    Most recent eye exam was in 2020, negative for retinopathy  Most recent foot exam: 10/19  Currently known complications of diabetes: None, needs urine microalbumin checked and follow-up eye exam  He is on methotrexate injections for his rheumatoid arthritis  LABS:  Office Visit on 04/08/2019  Component Date Value Ref Range Status  . Hemoglobin A1C 04/08/2019 6.1* 4.0 - 5.6 % Final    Physical Examination:  BP 130/70 (BP Location: Left Arm)   Pulse 89   Ht 5' 10.25" (1.784 m)   Wt 203 lb 12.8 oz (92.4 kg)    SpO2 96%   BMI 29.03 kg/m   No ankle edema   ASSESSMENT:  Diabetes type 2    See history of present illness for detailed discussion of current diabetes management, blood sugar patterns and problems identified  His A1c is back down to 6.1 Last known fructosamine was upper normal  He is likely benefiting from increasing Invokana Since his A1c was previously higher most likely he was having postprandial hyper glycemia His Amaryl has been reduced to 2 mg with Invokana  He is trying to be a little more active also His only difficulty with diet is drinking regular soft drinks with one of his meals but not clear if he has high readings postprandially as he forgets to check   PLAN:    Glucose monitoring: Discussed the need to check blood sugars 2 hours after one of his meals  Encouraged him to be increasing his activity level  No change in medications Follow-up in 3 months Discussed reducing amounts of regular soft drinks and trying other types of liquids with meals instead  Patient Instructions  Check blood sugars on waking up 3-4 days a week  Also check blood sugars about 2 hours after meals and do this after different meals by rotation  Recommended blood sugar levels on waking up are 90-130 and about 2 hours after meal is 130-160  Please bring your blood sugar monitor to each visit, thank you  Reduce colas  Walk daily        Elayne Snare 04/08/2019, 10:05 AM   Note: This office note was prepared with Dragon voice recognition system technology. Any transcriptional errors that result from this process are unintentional.

## 2019-04-17 ENCOUNTER — Inpatient Hospital Stay: Admission: RE | Admit: 2019-04-17 | Payer: Medicare Other | Source: Ambulatory Visit

## 2019-04-21 ENCOUNTER — Ambulatory Visit
Admission: RE | Admit: 2019-04-21 | Discharge: 2019-04-21 | Disposition: A | Discharge status: Home / Self Care | Payer: Medicare Other | Source: Ambulatory Visit | Attending: Family Medicine | Admitting: Family Medicine

## 2019-04-21 ENCOUNTER — Other Ambulatory Visit: Payer: Self-pay

## 2019-04-21 DIAGNOSIS — M25511 Pain in right shoulder: Secondary | ICD-10-CM

## 2019-04-21 DIAGNOSIS — G8929 Other chronic pain: Secondary | ICD-10-CM

## 2019-04-21 IMAGING — MR MR SHOULDER*R* W/O CM
5 series · 35 of 40 positions shown · non-contrast
Comparison: None.

CLINICAL DATA: Diffuse right shoulder pain radiating into the right
forearm for 1 year. The patient was diagnosed with rheumatoid
arthritis in the past year. No known injury.

EXAM:
MRI OF THE RIGHT SHOULDER WITHOUT CONTRAST
TECHNIQUE: Multiplanar, multisequence MR imaging of the shoulder was performed.
No intravenous contrast was administered.

[Series 3: PD fat-sat · axial · 4.0mm · 0.55mm/px · z∈[-17,+73]mm · 8 of 22 slices shown (1 of 2)]
[im 1/22]
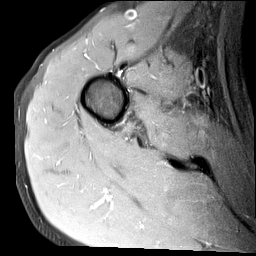
[im 4/22]
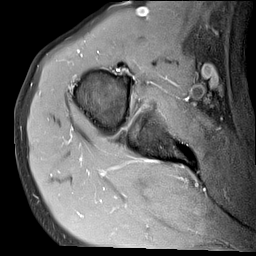
[im 7/22]
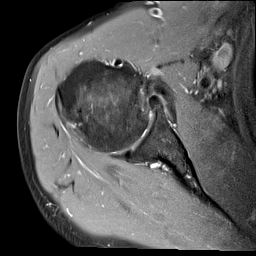
[im 10/22]
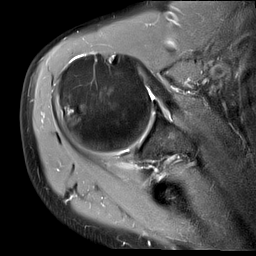
[im 13/22]
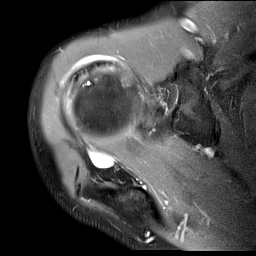
[im 16/22]
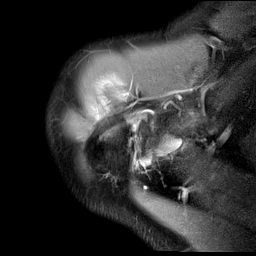
[im 19/22]
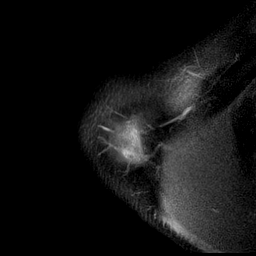
[im 22/22]
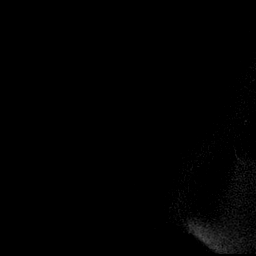

[Series 4: T2 fat-sat · oblique · 4.0mm · 0.55mm/px · 8 of 20 slices shown (1 of 2)]
[im 1/20]
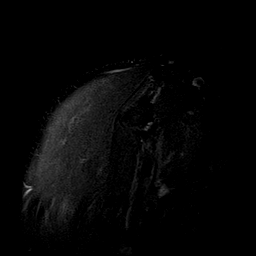
[im 3/20]
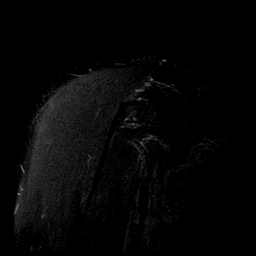
[im 6/20]
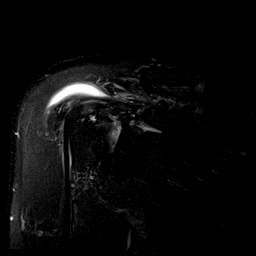
[im 9/20]
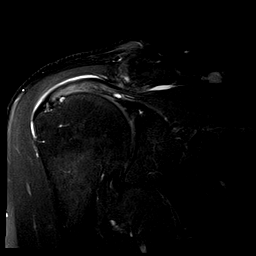
[im 11/20]
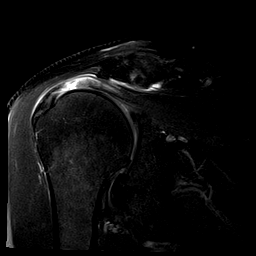
[im 14/20]
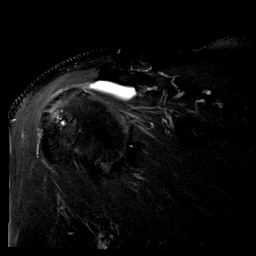
[im 17/20]
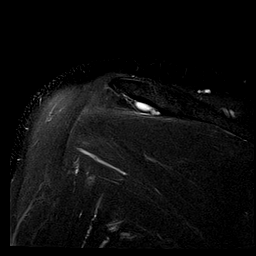
[im 20/20]
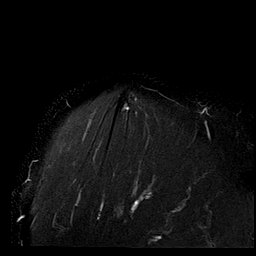

[Series 5: PD fat-sat · oblique · 4.0mm · 0.27mm/px · 8 of 20 slices shown (2 of 2)]
[im 1/20]
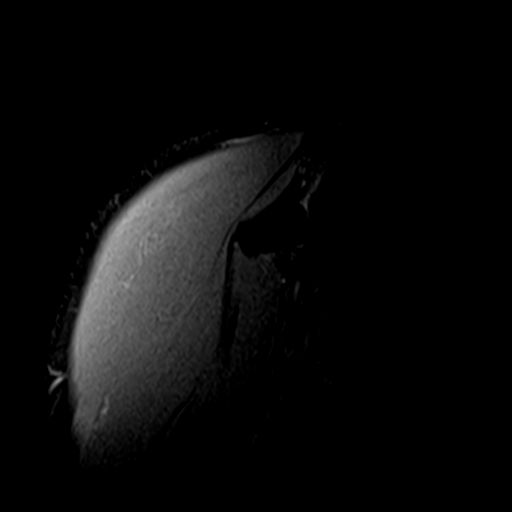
[im 3/20]
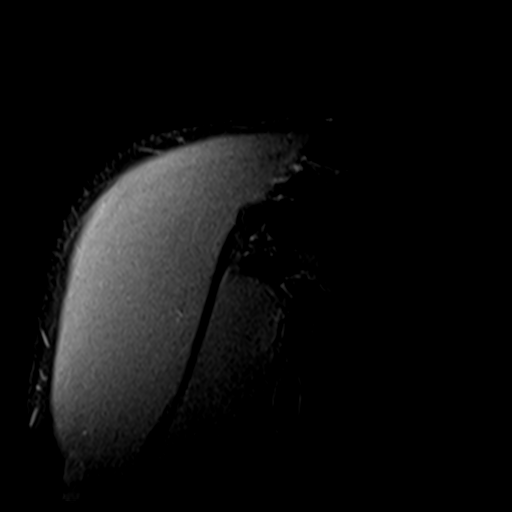
[im 6/20]
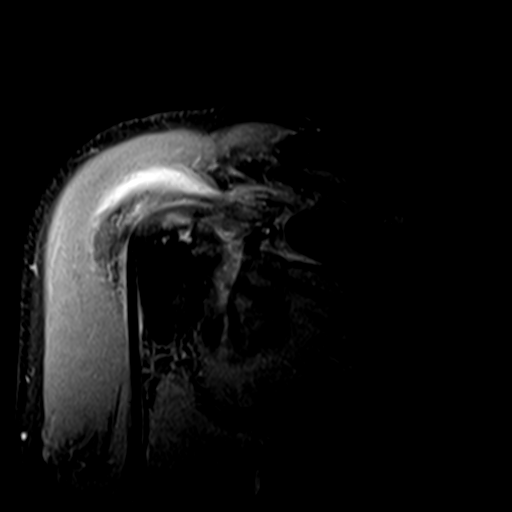
[im 9/20]
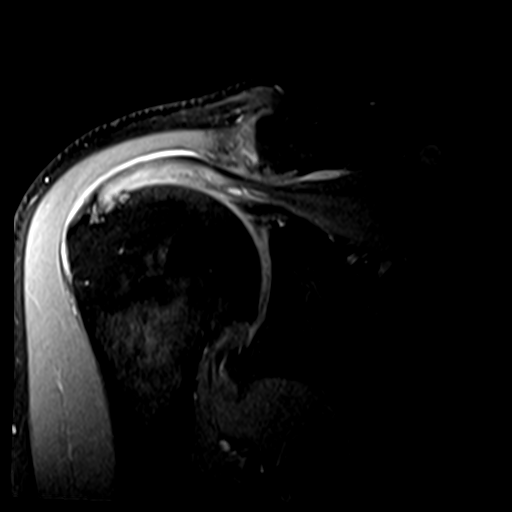
[im 11/20]
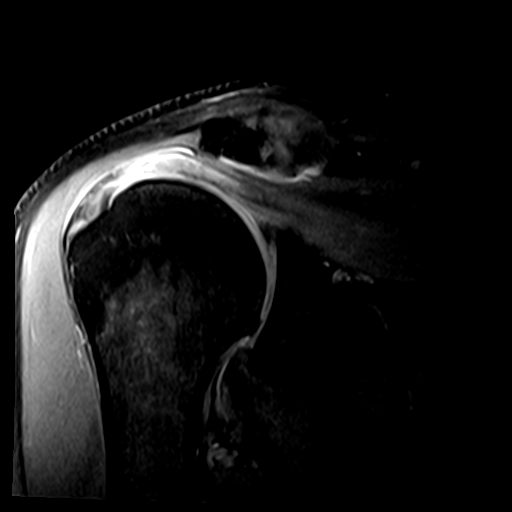
[im 14/20]
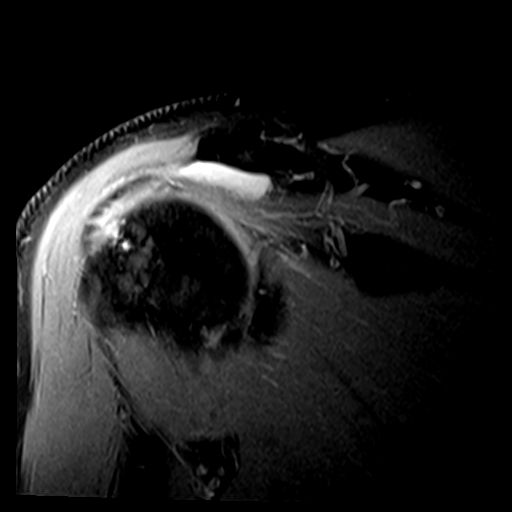
[im 17/20]
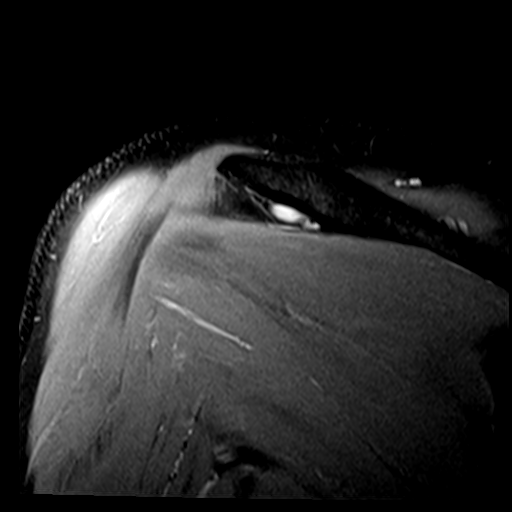
[im 20/20]
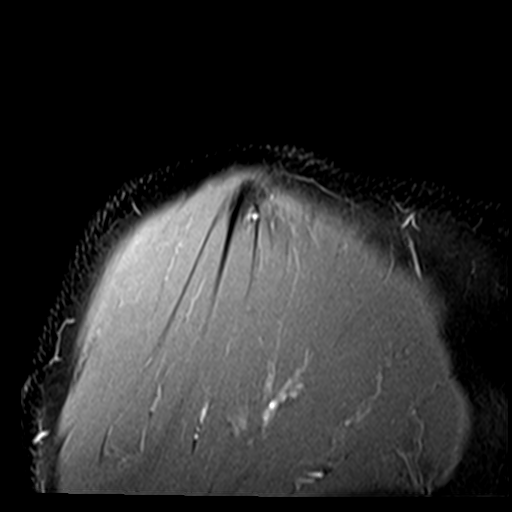

[Series 6: T1 · oblique · 4.0mm · 0.27mm/px · 3 of 20 slices shown]
[im 1/20]
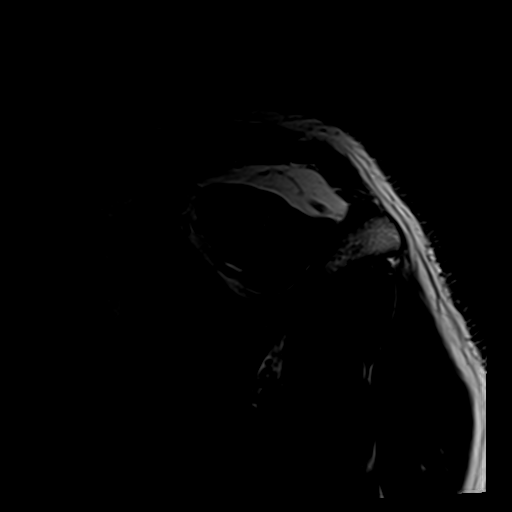
[im 3/20]
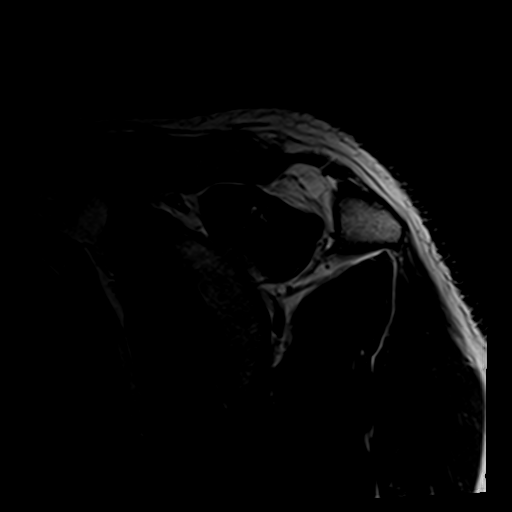
[im 6/20]
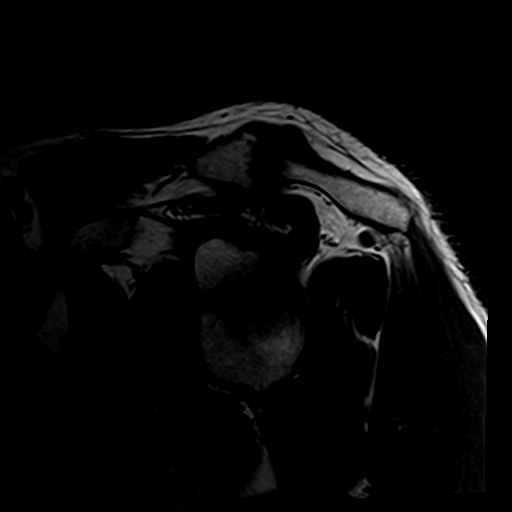

[Series 7: T2 fat-sat · oblique · 4.0mm · 0.55mm/px · 8 of 20 slices shown (2 of 2)]
[im 1/20]
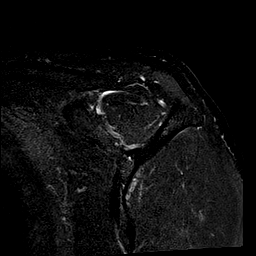
[im 3/20]
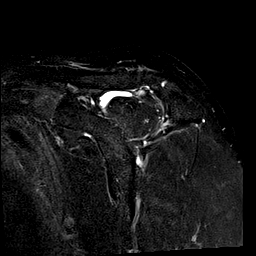
[im 6/20]
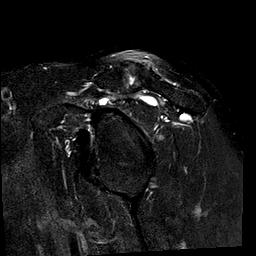
[im 9/20]
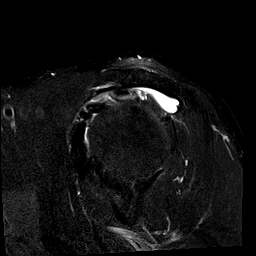
[im 11/20]
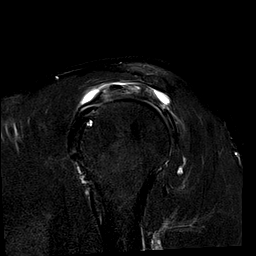
[im 14/20]
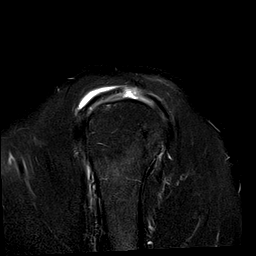
[im 17/20]
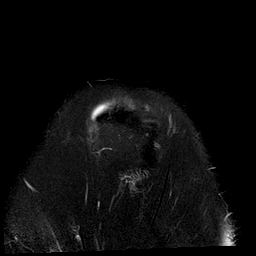
[im 20/20]
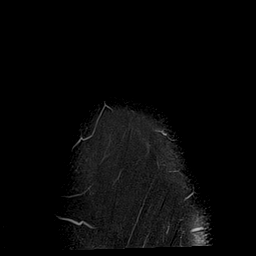

[35 of 40 positions shown; findings below may reference images not displayed]

FINDINGS: Rotator cuff: The patient has rotator cuff tendinopathy. There is a
partial width, full-thickness tear at the junction of the
supraspinatus and infraspinatus measuring 0.5 cm from front to back.
The tear is 1.5 cm medial to the tendon insertion with a gap between
tendon fragments of 1-2 cm.

Muscles:  Normal without atrophy or focal lesion.

Biceps long head:  Intact.

Acromioclavicular Joint: Moderate osteoarthritis. Type 2 acromion.
There is some subacromial spurring. Fluid is present in the
subacromial/subdeltoid bursa.

Glenohumeral Joint: Minimal degenerative change is seen.

Labrum:  The superior labrum is degenerated without focal tear.

Bones:  No fracture or focal lesion. No erosion.

Other: None.
IMPRESSION: 1. Rotator cuff tendinopathy with a 0.5 cm from front to back
full-thickness tear at the junction of the supraspinatus and
infraspinatus. The tear is 1.5 cm medial to the tendon insertion
with a gap between tendon fragments of 1-2 cm. No atrophy.
2. Moderate acromioclavicular osteoarthritis. Type 2 acromion with
some subacromial spurring also noted.
3. Subacromial/subdeltoid fluid consistent with bursitis.
4. Negative for evidence of rheumatoid arthritis.

## 2019-04-24 ENCOUNTER — Other Ambulatory Visit: Payer: Medicare Other

## 2019-04-26 ENCOUNTER — Other Ambulatory Visit: Payer: Self-pay

## 2019-04-26 DIAGNOSIS — M75121 Complete rotator cuff tear or rupture of right shoulder, not specified as traumatic: Secondary | ICD-10-CM

## 2019-04-29 ENCOUNTER — Telehealth: Payer: Self-pay

## 2019-04-29 NOTE — Telephone Encounter (Signed)
Unable to leave a message at the pts cell number due to no voicemail.

## 2019-04-29 NOTE — Telephone Encounter (Signed)
Copied from [place] [id number]. Topic: Referral - Status >> Apr 29, 2019  9:40 AM Oneta Rack wrote: [facility] unable to reach patient at the #s listed in patient chart regarding scheduling

## 2019-04-29 NOTE — Telephone Encounter (Signed)
Patient called back and stated he spoke with someone from their office and has an appt scheduled.

## 2019-05-13 ENCOUNTER — Telehealth: Payer: Self-pay | Admitting: Family Medicine

## 2019-05-13 NOTE — Telephone Encounter (Signed)
Message Routed to PCP CMA 

## 2019-05-13 NOTE — Telephone Encounter (Signed)
Sherry with [facility] and [facility] is calling in for surgical clearance for pt. They faxed over on 12/8 ,    Phone: [phone]

## 2019-05-14 NOTE — Telephone Encounter (Signed)
I just wanted to have a virtual visit to run through surgical history and any current symptoms. I do not feel he needs to be seen in the office but that this can just be done virtually. If he has time tomorrow we should have openings.

## 2019-05-14 NOTE — Telephone Encounter (Signed)
Fax received and placed on Dr Berenice Bouton desk.

## 2019-05-14 NOTE — Telephone Encounter (Signed)
I called Judeen Hammans and informed her we have not received a clearance on this pt.  Judeen Hammans stated she will fax this again and I advised her this will be forwarded to Dr Ethlyn Gallery.

## 2019-05-14 NOTE — Telephone Encounter (Signed)
Patient informed of the message below and an appt was scheduled for 05/29/2019.

## 2019-05-15 ENCOUNTER — Other Ambulatory Visit: Payer: Self-pay | Admitting: Endocrinology

## 2019-05-20 ENCOUNTER — Other Ambulatory Visit: Payer: Self-pay | Admitting: Endocrinology

## 2019-05-29 ENCOUNTER — Telehealth (INDEPENDENT_AMBULATORY_CARE_PROVIDER_SITE_OTHER): Payer: Medicare Other | Admitting: Family Medicine

## 2019-05-29 ENCOUNTER — Other Ambulatory Visit: Payer: Self-pay

## 2019-05-29 ENCOUNTER — Encounter: Payer: Self-pay | Admitting: Family Medicine

## 2019-05-29 DIAGNOSIS — E1159 Type 2 diabetes mellitus with other circulatory complications: Secondary | ICD-10-CM

## 2019-05-29 DIAGNOSIS — Z01818 Encounter for other preprocedural examination: Secondary | ICD-10-CM

## 2019-05-29 DIAGNOSIS — E785 Hyperlipidemia, unspecified: Secondary | ICD-10-CM

## 2019-05-29 DIAGNOSIS — E1169 Type 2 diabetes mellitus with other specified complication: Secondary | ICD-10-CM

## 2019-05-29 DIAGNOSIS — I152 Hypertension secondary to endocrine disorders: Secondary | ICD-10-CM

## 2019-05-29 DIAGNOSIS — M75121 Complete rotator cuff tear or rupture of right shoulder, not specified as traumatic: Secondary | ICD-10-CM

## 2019-05-29 DIAGNOSIS — I10 Essential (primary) hypertension: Secondary | ICD-10-CM

## 2019-05-29 DIAGNOSIS — M069 Rheumatoid arthritis, unspecified: Secondary | ICD-10-CM

## 2019-05-29 NOTE — Progress Notes (Signed)
Virtual Visit via Video Note  I connected with Raymond Vasquez  on 05/29/19 at  1:00 PM EST by a video enabled telemedicine application and verified that I am speaking with the correct person using two identifiers.  Location patient: home Location provider:work or home office Persons participating in the virtual visit: patient, provider  I discussed the limitations of evaluation and management by telemedicine and the availability of in person appointments. The patient expressed understanding and agreed to proceed.   Raymond Vasquez Date of Birth:  1951-02-18  This patient presents to the office today for a preoperative consultation at the request of surgeon, Dr. Noemi Chapel, who plans on performing right shoulder scope rotator cuff repair on TBD date.   Planned anesthesia: general  Known anesthesia problems: no  Bleeding risk: no Personal or FH of DVT/PE: no    Patient Active Problem List   Diagnosis Date Noted  . RA (rheumatoid arthritis) ([place]) 07/24/2018  . Hyperlipidemia associated with type 2 diabetes mellitus ([place]) 08/10/2017  . Anal or rectal pain 08/10/2017  . BMI 30.0-30.9,adult 08/04/2016  . 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 Hyperlipidemia 11/20/2006  . Hypertension associated with diabetes ([place]) 11/20/2006   Past Surgical History:  Procedure Laterality Date  . APPENDECTOMY    . FINGER SURGERY  06/2012  . LAPAROSCOPIC GASTRIC BANDING  08/10/2009    No Known Allergies Current Meds  Medication Sig  . diclofenac (VOLTAREN) 75 MG EC tablet Take 75 mg by mouth 2 (two) times daily.   . folic acid (FOLVITE) 1 MG tablet Take 1 mg by mouth daily.  Marland Kitchen glimepiride (AMARYL) 4 MG tablet TAKE 1 TABLET BY MOUTH DAILY WITH BREAKFAST (Patient taking differently: 2 mg. )  . glucose blood (ONETOUCH VERIO) test strip CHECK BLOOD SUGAR ONCE A DAY ALTERNATING FASTING AND 2 HOURS AFTER A MEAL  . INVOKANA 300 MG TABS tablet TAKE 1 TABLET(300 MG) BY MOUTH  DAILY BEFORE BREAKFAST  . loratadine (CLARITIN) 10 MG tablet Take 10 mg by mouth daily.  . metFORMIN (GLUCOPHAGE) 1000 MG tablet TAKE 1 TABLET BY MOUTH TWICE DAILY WITH MEAL  . Methotrexate Sodium (METHOTREXATE, PF,) 50 MG/2ML injection INJECT 1 ML ONCE A WEEK  . ONETOUCH DELICA LANCETS FINE MISC Use as instructed by provider  . rosuvastatin (CRESTOR) 20 MG tablet TAKE 1 TABLET(20 MG) BY MOUTH DAILY  . TRULICITY 1.5 0000000 SOPN INJECT 1.5MG  INTO THE SKIN ONCE WEEKLY   Current Facility-Administered Medications for the 05/29/19 encounter (Telemedicine) with Caren Macadam, MD  Medication  . 0.9 %  sodium chloride infusion  . 0.9 %  sodium chloride infusion    Social History   Tobacco Use  . Smoking status: Current Some Day Smoker    Types: Cigars  . Smokeless tobacco: Current User    Types: Chew  . Tobacco comment: 1 cigar occassionally, not more then 1x per week  Substance Use Topics  . Alcohol use: No   Family History  Problem Relation Age of Onset  . Heart disease Mother   . Diabetes Maternal Grandmother     Review of Systems Review of Systems  Constitutional: Negative for chills, fever and weight loss.  HENT: Negative for congestion, ear discharge, ear pain, hearing loss, nosebleeds, sinus pain, sore throat and tinnitus.   Eyes: Negative for blurred vision and double vision.  Respiratory: Negative for cough, shortness of breath and wheezing.   Cardiovascular: Negative for chest pain.  Gastrointestinal: Negative for abdominal pain.  Genitourinary: Negative for dysuria.  Musculoskeletal: Positive for joint pain. Negative for myalgias.  Neurological: Negative for dizziness and headaches.  Endo/Heme/Allergies: Does not bruise/bleed easily.  Psychiatric/Behavioral: Negative for depression. The patient is not nervous/anxious.    Blood pressure has been stable when checked at doctor's offices. Doesn't check regularly at home.  Blood sugars have been a little higher in the  last couple days, but generally been well controlled.  He does check multiple times daily and last A1c was 6.1.   Recent Labs: CBC:  Lab Results  Component Value Date   WBC 7.8 03/22/2018   WBC 8.4 11/27/2017   HGB 12.7 (L) 03/22/2018   HCT 40.1 03/22/2018   MCH 27.8 03/22/2018   MCH 28.9 08/03/2013   MCHC 31.7 03/22/2018   MCHC 33.0 11/27/2017   RDW 15.3 03/22/2018   PLT 326 03/22/2018   CMP:  Lab Results  Component Value Date   NA 139 03/07/2019   K 4.4 03/07/2019   CL 101 03/07/2019   CO2 31 03/07/2019   GLUCOSE 180 (H) 03/07/2019   BUN 20 03/07/2019   CREATININE 0.98 03/07/2019   GFRAA >90 08/03/2013   CALCIUM 10.0 03/07/2019   PROT 6.7 01/14/2019   BILITOT 0.3 01/14/2019   ALKPHOS 37 (L) 01/14/2019   ALT 25 01/14/2019   AST 18 01/14/2019    HBA1C: No results found for: LABA1C, EAG  Objective:   There were no vitals taken for this visit.     Physical Exam GENERAL: alert, oriented, appears well and in no acute distress  HEENT: atraumatic, conjunctiva clear, no obvious abnormalities on inspection of external nose and ears  NECK: normal movements of the head and neck  LUNGS: on inspection no signs of respiratory distress, breathing rate appears normal, no obvious gross SOB, gasping or wheezing  CV: no obvious cyanosis  MS: moves all visible extremities without noticeable abnormality  PSYCH/NEURO: pleasant and cooperative, no obvious depression or anxiety, speech and thought processing grossly intact  I discussed the assessment and treatment plan with the patient. The patient was provided an opportunity to ask questions and all were answered. The patient agreed with the plan and demonstrated an understanding of the instructions.   The patient was advised to call back or seek an in-person evaluation if the symptoms worsen or if the condition fails to improve as anticipated.  I provided 22 minutes of non-face-to-face time during this encounter.   Micheline Rough, MD    Lab Review: yes from November    Assessment:   Raymond Vasquez was seen today for medical clearance.  Diagnoses and all orders for this visit:  Hypertension associated with diabetes [facility]): Well-controlled  Preoperative clearance: No previous issues with medical complications perisurgically or current contraindications to planned surgery.  Hyperlipidemia associated with type 2 diabetes mellitus ([place]): Well-controlled  Rheumatoid arthritis involving multiple sites, unspecified whether rheumatoid factor present [facility]): Symptoms are stable on methotrexate.  Rotator cuff tear right shoulder: will be scheduled for surgery. Daily discomfort.   69 y.o.patient  approved for Surgery     Plan:   1. Preoperative workup as follows:none 2. Change in medication regimen before surgery: none recommended 3. No contraindications to planned surgery  Note electronically signed by provider.  Micheline Rough, MD

## 2019-05-30 ENCOUNTER — Telehealth: Payer: Self-pay | Admitting: *Deleted

## 2019-05-30 NOTE — Telephone Encounter (Signed)
-----   Message from Caren Macadam, MD sent at 05/30/2019  1:38 PM EST ----- Please forward note to Dr. Noemi Chapel

## 2019-05-30 NOTE — Telephone Encounter (Signed)
Office note faxed to Dr Noemi Chapel at [phone] attn: surgical scheduler.

## 2019-06-06 ENCOUNTER — Telehealth: Payer: Self-pay | Admitting: *Deleted

## 2019-06-06 NOTE — Telephone Encounter (Signed)
Patient informed of the message below.

## 2019-06-06 NOTE — Telephone Encounter (Signed)
Spoke with the patient and informed him of the message below.  Patient denies any symptoms, stated he is feeling fine and wanted to know due to his age if he needs to have a stress test? Message sent to PCP.

## 2019-06-06 NOTE — Telephone Encounter (Signed)
We do not routinely do stress tests typically unless having symptoms that are concerning for heart disease. Sometimes for cardiac patients the cardiologists will routinely test after known heart disease (like cardiac caths) to make sure heart is functioning well. So no I would not do a baseline stress just due to age.

## 2019-06-06 NOTE — Telephone Encounter (Signed)
-----   Message from Caren Macadam, MD sent at 06/05/2019  4:44 PM EST ----- I had his name down for wanting info about COVID vaccine when available. He doesn't have mychart; can you share:  COVID-19 Vaccine Information can be found at: ShippingSc[employer].co.uk For questions related to vaccine distribution or appointments, please [email or web address] or [phone].   And can check as well on county health dept web sites for [facility].

## 2019-06-24 HISTORY — PX: ROTATOR CUFF REPAIR: SHX139

## 2019-07-09 ENCOUNTER — Other Ambulatory Visit (INDEPENDENT_AMBULATORY_CARE_PROVIDER_SITE_OTHER): Payer: Medicare Other

## 2019-07-09 ENCOUNTER — Other Ambulatory Visit: Payer: Self-pay

## 2019-07-09 DIAGNOSIS — E1165 Type 2 diabetes mellitus with hyperglycemia: Secondary | ICD-10-CM | POA: Diagnosis not present

## 2019-07-09 LAB — COMPREHENSIVE METABOLIC PANEL
ALT: 32 U/L (ref 0–53)
AST: 21 U/L (ref 0–37)
Albumin: 4.5 g/dL (ref 3.5–5.2)
Alkaline Phosphatase: 37 U/L — ABNORMAL LOW (ref 39–117)
BUN: 17 mg/dL (ref 6–23)
CO2: 31 mEq/L (ref 19–32)
Calcium: 9.6 mg/dL (ref 8.4–10.5)
Chloride: 105 mEq/L (ref 96–112)
Creatinine, Ser: 0.94 mg/dL (ref 0.40–1.50)
GFR: 80.22 mL/min (ref >60.00)
Glucose, Bld: 128 mg/dL — ABNORMAL HIGH (ref 70–99)
Potassium: 4.2 mEq/L (ref 3.5–5.1)
Sodium: 140 mEq/L (ref 135–145)
Total Bilirubin: 0.4 mg/dL (ref 0.2–1.2)
Total Protein: 6.6 g/dL (ref 6.0–8.3)

## 2019-07-09 LAB — HEMOGLOBIN A1C: Hgb A1c MFr Bld: 7.5 % — ABNORMAL HIGH (ref 4.6–6.5)

## 2019-07-11 ENCOUNTER — Encounter: Payer: Self-pay | Admitting: Endocrinology

## 2019-07-11 ENCOUNTER — Ambulatory Visit (INDEPENDENT_AMBULATORY_CARE_PROVIDER_SITE_OTHER): Payer: Medicare Other | Admitting: Endocrinology

## 2019-07-11 ENCOUNTER — Other Ambulatory Visit: Payer: Self-pay

## 2019-07-11 DIAGNOSIS — E1165 Type 2 diabetes mellitus with hyperglycemia: Secondary | ICD-10-CM | POA: Diagnosis not present

## 2019-07-11 DIAGNOSIS — E7800 Pure hypercholesterolemia, unspecified: Secondary | ICD-10-CM

## 2019-07-11 MED ORDER — TRULICITY 3 MG/0.5ML ~~LOC~~ SOAJ: 3.0000 mg | SUBCUTANEOUS | 2 refills | Status: DC

## 2019-07-11 NOTE — Progress Notes (Signed)
Patient ID: Raymond Vasquez, male   DOB: 25-Jul-1950, 69 y.o.   MRN: [id number]          Reason for Appointment: Follow-up for Type 2 Diabetes  I connected with the above-named patient by video enabled telemedicine application and verified that I am speaking with the correct person. The patient was explained the limitations of evaluation and management by telemedicine and the availability of in person appointments.  Patient also understood that there may be a patient responsible charge related to this service . Location of the patient: Patient's home . Location of the provider: Physician office Only the patient and myself were participating in the encounter The patient understood the above statements and agreed to proceed.    History of Present Illness:          Date of diagnosis of type 2 diabetes mellitus: 2003 approximately       Recent history:   His A1c has gone back up to 7.5 compared to 6.1  Non-insulin hypoglycemic drugs the patient is taking are: Metformin 1 g twice daily, Amaryl 2 mg in a.m., Trulicity 1.5 mg weekly, Invokana 300 mg daily  Current management, blood sugar patterns and problems identified:  He had better blood sugar control as previously with increasing his Invokana to 300 mg  Although he appears to be taking all his medications and Trulicity regularly as before his blood sugars are higher  FASTING blood sugar range 78-189 at home although only 1 reading above 145  He does not check readings after meals  Does not do any exercise and does not like to go out walking in the cold weather  Does not think he has increased his consumption of regular soft drinks, usually having 1 serving daily  Also he does not think his types of meals or portions are increasing  His weight at home is 201 compared to 203 in the office previously  Currently taking glimepiride in the morning, half of the 4 mg tablet  Has not had any steroids recently either injectable or oral   However he did have shoulder surgery but does not think his sugars were higher because of this       Side effects from medications have been: None    Typical meal intake: Breakfast is usually skipped, sometimes juice and muffin.  Sometimes will have regular soft drinks               Glucose monitoring:  done 1 times a day         Glucometer: One Touch .       Blood Glucose readings from patient reviewing monitor: As above  No readings after meals  PREVIOUS fasting range 112-208 AVERAGE 133   Dietician visit, most recent: Before his lap band surgery  Weight history: Maximum 280 in the past  Wt Readings from Last 3 Encounters:  04/08/19 203 lb 12.8 oz (92.4 kg)  04/03/19 203 lb 6.4 oz (92.3 kg)  07/24/18 205 lb (93 kg)    Glycemic control:   Lab Results  Component Value Date   HGBA1C 7.5 (H) 07/09/2019   HGBA1C 6.1 (A) 04/08/2019   HGBA1C 7.1 (H) 01/14/2019   Lab Results  Component Value Date   MICROALBUR <0.7 01/14/2019   LDLCALC 45 07/24/2018   CREATININE 0.94 07/09/2019   Lab Results  Component Value Date   MICRALBCREAT 1.0 01/14/2019    Lab Results  Component Value Date   FRUCTOSAMINE 263 03/07/2019   FRUCTOSAMINE 281  03/22/2018    Lab on 07/09/2019  Component Date Value Ref Range Status  . Sodium 07/09/2019 140  135 - 145 mEq/L Final  . Potassium 07/09/2019 4.2  3.5 - 5.1 mEq/L Final  . Chloride 07/09/2019 105  96 - 112 mEq/L Final  . CO2 07/09/2019 31  19 - 32 mEq/L Final  . Glucose, Bld 07/09/2019 128* 70 - 99 mg/dL Final  . BUN 07/09/2019 17  6 - 23 mg/dL Final  . Creatinine, Ser 07/09/2019 0.94  0.40 - 1.50 mg/dL Final  . Total Bilirubin 07/09/2019 0.4  0.2 - 1.2 mg/dL Final  . Alkaline Phosphatase 07/09/2019 37* 39 - 117 U/L Final  . AST 07/09/2019 21  0 - 37 U/L Final  . ALT 07/09/2019 32  0 - 53 U/L Final  . Total Protein 07/09/2019 6.6  6.0 - 8.3 g/dL Final  . Albumin 07/09/2019 4.5  3.5 - 5.2 g/dL Final  . GFR 07/09/2019 80.22  >60.00  mL/min Final  . Calcium 07/09/2019 9.6  8.4 - 10.5 mg/dL Final  . Hgb A1c MFr Bld 07/09/2019 7.5* 4.6 - 6.5 % Final   Glycemic Control Guidelines for People with Diabetes:Non Diabetic:  <6%Goal of Therapy: <7%Additional Action Suggested:  >8%     Allergies as of 07/11/2019   No Known Allergies     Medication List       Accurate as of July 11, 2019  9:14 AM. If you have any questions, ask your nurse or doctor.        diclofenac 75 MG EC tablet Commonly known as: VOLTAREN Take 75 mg by mouth 2 (two) times daily.   folic acid 1 MG tablet Commonly known as: FOLVITE Take 1 mg by mouth daily.   glimepiride 4 MG tablet Commonly known as: AMARYL TAKE 1 TABLET BY MOUTH DAILY WITH BREAKFAST What changed:   how much to take  how to take this  when to take this   Invokana 300 MG Tabs tablet Generic drug: canagliflozin TAKE 1 TABLET(300 MG) BY MOUTH DAILY BEFORE BREAKFAST   loratadine 10 MG tablet Commonly known as: CLARITIN Take 10 mg by mouth daily.   metFORMIN 1000 MG tablet Commonly known as: GLUCOPHAGE TAKE 1 TABLET BY MOUTH TWICE DAILY WITH MEAL   methotrexate (PF) 50 MG/2ML injection INJECT 1 ML ONCE A WEEK   OneTouch Delica Lancets Fine Misc Use as instructed by provider   OneTouch Verio test strip Generic drug: glucose blood CHECK BLOOD SUGAR ONCE A DAY ALTERNATING FASTING AND 2 HOURS AFTER A MEAL   rosuvastatin 20 MG tablet Commonly known as: CRESTOR TAKE 1 TABLET(20 MG) BY MOUTH DAILY   Trulicity 1.5 0000000 Sopn Generic drug: Dulaglutide INJECT 1.5MG  INTO THE SKIN ONCE WEEKLY       Allergies: No Known Allergies  Past Medical History:  Diagnosis Date  . Angioedema    he is unsure of this or cause  . 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 Diabetes mellitus type II, controlled, with no complications ([place])   . Hemorrhoids   . 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 Hyperlipidemia   . Hypertension   . Nephrolithiasis     1980s  . OSA (obstructive sleep apnea)   . PMR (polymyalgia rheumatica) ([facility])   . Shingles    2014    Past Surgical History:  Procedure Laterality Date  . APPENDECTOMY    . FINGER SURGERY  06/2012  . LAPAROSCOPIC GASTRIC BANDING  08/10/2009    Family  History  Problem Relation Age of Onset  . Heart disease Mother   . Diabetes Maternal Grandmother     Social History:  reports that he has been smoking cigars. His smokeless tobacco use includes chew. He reports that he does not drink alcohol or use drugs.   Review of Systems   Lipid history: Followed by PCP, has adequate levels on Crestor 20 mg daily    Lab Results  Component Value Date   CHOL 107 07/24/2018   HDL 36.10 (L) 07/24/2018   LDLCALC 45 07/24/2018   LDLDIRECT 154.1 02/16/2010   TRIG 130.0 07/24/2018   CHOLHDL 3 07/24/2018           Hypertension: Has been off medications with using Invokana Blood pressure not recently checked   BP Readings from Last 3 Encounters:  04/08/19 130/70  04/03/19 102/78  07/24/18 96/64    Most recent eye exam was in 2020, negative for retinopathy  Most recent foot exam: 10/19  Currently known complications of diabetes: None, needs urine microalbumin checked and follow-up eye exam  He is on methotrexate injections for his rheumatoid arthritis  LABS:  Lab on 07/09/2019  Component Date Value Ref Range Status  . Sodium 07/09/2019 140  135 - 145 mEq/L Final  . Potassium 07/09/2019 4.2  3.5 - 5.1 mEq/L Final  . Chloride 07/09/2019 105  96 - 112 mEq/L Final  . CO2 07/09/2019 31  19 - 32 mEq/L Final  . Glucose, Bld 07/09/2019 128* 70 - 99 mg/dL Final  . BUN 07/09/2019 17  6 - 23 mg/dL Final  . Creatinine, Ser 07/09/2019 0.94  0.40 - 1.50 mg/dL Final  . Total Bilirubin 07/09/2019 0.4  0.2 - 1.2 mg/dL Final  . Alkaline Phosphatase 07/09/2019 37* 39 - 117 U/L Final  . AST 07/09/2019 21  0 - 37 U/L Final  . ALT 07/09/2019 32  0 - 53 U/L Final  . Total Protein 07/09/2019 6.6   6.0 - 8.3 g/dL Final  . Albumin 07/09/2019 4.5  3.5 - 5.2 g/dL Final  . GFR 07/09/2019 80.22  >60.00 mL/min Final  . Calcium 07/09/2019 9.6  8.4 - 10.5 mg/dL Final  . Hgb A1c MFr Bld 07/09/2019 7.5* 4.6 - 6.5 % Final   Glycemic Control Guidelines for People with Diabetes:Non Diabetic:  <6%Goal of Therapy: <7%Additional Action Suggested:  >8%     Physical Examination:  There were no vitals taken for this visit.    ASSESSMENT:  Diabetes type 2    See history of present illness for detailed discussion of current diabetes management, blood sugar patterns and problems identified  His A1c is back up at 7.5 even though it was 6.1 previously  Currently on a program of Invokana 300 mg, Amaryl 2 mg, Trulicity and Metformin  Not clear why his blood sugars are higher As before he is monitoring mostly in the morning and likely has postprandial hyperglycemia since his morning sugars are averaging at least less than 150 recently He does not think his diet has been poor but still has some intake of regular soft drinks He can do better with exercise  History of hyperlipidemia on Crestor, needs follow-up labs on his next visit  Need for Covid vaccination: He has not been able to schedule this and appears to be agreeable to doing the vaccine Discussed importance of doing this as soon as possible and this will also allow him to be going out for exercise He will try to sign up either  at the [facility] health website or the [email or web address] website, advised him to sign up for text alerts  PLAN:    Glucose monitoring: Reminded him to check his sugars after evening meal between 2 to 3 hours later Discussed that these sugars should be at least under 180 and preferably lower consistently He will try to avoid regular soft drinks completely Also if he finds certain foods making his sugar go up he will portions of these Encouraged him to start an indoor video exercise program on his computer Increase  Trulicity to 3 mg after he takes his last dose of 1.5 this weekend  Call if blood sugars are consistently high  History of hypertension: He needs to check blood pressure regularly at home  Follow-up in 3 months    There are no Patient Instructions on file for this visit.       Elayne Snare 07/11/2019, 9:14 AM   Note: This office note was prepared with Dragon voice recognition system technology. Any transcriptional errors that result from this process are unintentional.

## 2019-07-12 ENCOUNTER — Ambulatory Visit: Payer: Medicare Other | Admitting: Endocrinology

## 2019-07-25 ENCOUNTER — Other Ambulatory Visit: Payer: Self-pay

## 2019-07-25 ENCOUNTER — Telehealth: Payer: Self-pay

## 2019-07-25 MED ORDER — JARDIANCE 25 MG PO TABS: 25.0000 mg | ORAL_TABLET | Freq: Every day | ORAL | 2 refills | Status: DC

## 2019-07-25 NOTE — Telephone Encounter (Signed)
Received fax from [place] stating that Invokana 300mg  tablets are not covered under pt's plan and requires a PA.   Would you like to complete PA or change medications?  Fax does not indicate what the preferred drug is.

## 2019-07-25 NOTE — Telephone Encounter (Signed)
D/c'd Raymond Vasquez and sent Rx for Jardiance 25mg  tablets. Called pt and informed him of this change and pt verbalized understanding.

## 2019-07-25 NOTE — Telephone Encounter (Signed)
We can try sending Jardiance 25 mg daily

## 2019-08-05 ENCOUNTER — Other Ambulatory Visit: Payer: Self-pay | Admitting: Endocrinology

## 2019-09-02 ENCOUNTER — Other Ambulatory Visit: Payer: Self-pay | Admitting: Family Medicine

## 2019-09-30 ENCOUNTER — Other Ambulatory Visit: Payer: Self-pay | Admitting: Endocrinology

## 2019-10-07 ENCOUNTER — Other Ambulatory Visit: Payer: Self-pay

## 2019-10-07 ENCOUNTER — Other Ambulatory Visit (INDEPENDENT_AMBULATORY_CARE_PROVIDER_SITE_OTHER): Payer: Medicare Other

## 2019-10-07 DIAGNOSIS — E1165 Type 2 diabetes mellitus with hyperglycemia: Secondary | ICD-10-CM

## 2019-10-07 LAB — COMPREHENSIVE METABOLIC PANEL
ALT: 30 U/L (ref 0–53)
AST: 25 U/L (ref 0–37)
Albumin: 4.5 g/dL (ref 3.5–5.2)
Alkaline Phosphatase: 36 U/L — ABNORMAL LOW (ref 39–117)
BUN: 14 mg/dL (ref 6–23)
CO2: 31 mEq/L (ref 19–32)
Calcium: 9.3 mg/dL (ref 8.4–10.5)
Chloride: 105 mEq/L (ref 96–112)
Creatinine, Ser: 0.89 mg/dL (ref 0.40–1.50)
GFR: 85.38 mL/min (ref >60.00)
Glucose, Bld: 153 mg/dL — ABNORMAL HIGH (ref 70–99)
Potassium: 4.6 mEq/L (ref 3.5–5.1)
Sodium: 141 mEq/L (ref 135–145)
Total Bilirubin: 0.4 mg/dL (ref 0.2–1.2)
Total Protein: 6.4 g/dL (ref 6.0–8.3)

## 2019-10-07 LAB — LIPID PANEL
Cholesterol: 116 mg/dL (ref 0–200)
HDL: 32.8 mg/dL — ABNORMAL LOW (ref >39.00)
LDL Cholesterol: 57 mg/dL (ref 0–99)
NonHDL: 82.87
Total CHOL/HDL Ratio: 4
Triglycerides: 130 mg/dL (ref 0.0–149.0)
VLDL: 26 mg/dL (ref 0.0–40.0)

## 2019-10-07 LAB — HEMOGLOBIN A1C: Hgb A1c MFr Bld: 6.6 % — ABNORMAL HIGH (ref 4.6–6.5)

## 2019-10-09 NOTE — Progress Notes (Deleted)
Patient ID: Raymond Vasquez, male   DOB: 06/13/1950, 69 y.o.   MRN: [id number]          Reason for Appointment: Follow-up for Type 2 Diabetes  I connected with the above-named patient by video enabled telemedicine application and verified that I am speaking with the correct person. The patient was explained the limitations of evaluation and management by telemedicine and the availability of in person appointments.  Patient also understood that there may be a patient responsible charge related to this service . Location of the patient: Patient's home . Location of the provider: Physician office Only the patient and myself were participating in the encounter The patient understood the above statements and agreed to proceed.    History of Present Illness:          Date of diagnosis of type 2 diabetes mellitus: 2003 approximately       Recent history:   His A1c has gone back up to 7.5 compared to 6.1  Non-insulin hypoglycemic drugs the patient is taking are: Metformin 1 g twice daily, Amaryl 2 mg in a.m., Trulicity 1.5 mg weekly, Invokana 300 mg daily  Current management, blood sugar patterns and problems identified:  He had better blood sugar control as previously with increasing his Invokana to 300 mg  Although he appears to be taking all his medications and Trulicity regularly as before his blood sugars are higher  FASTING blood sugar range 78-189 at home although only 1 reading above 145  He does not check readings after meals  Does not do any exercise and does not like to go out walking in the cold weather  Does not think he has increased his consumption of regular soft drinks, usually having 1 serving daily  Also he does not think his types of meals or portions are increasing  His weight at home is 201 compared to 203 in the office previously  Currently taking glimepiride in the morning, half of the 4 mg tablet  Has not had any steroids recently either injectable or  oral  However he did have shoulder surgery but does not think his sugars were higher because of this       Side effects from medications have been: None    Typical meal intake: Breakfast is usually skipped, sometimes juice and muffin.  Sometimes will have regular soft drinks               Glucose monitoring:  done 1 times a day         Glucometer: One Touch .       Blood Glucose readings from patient reviewing monitor: As above  No readings after meals  PREVIOUS fasting range 112-208 AVERAGE 133   Dietician visit, most recent: Before his lap band surgery  Weight history: Maximum 280 in the past  Wt Readings from Last 3 Encounters:  04/08/19 203 lb 12.8 oz (92.4 kg)  04/03/19 203 lb 6.4 oz (92.3 kg)  07/24/18 205 lb (93 kg)    Glycemic control:   Lab Results  Component Value Date   HGBA1C 6.6 (H) 10/07/2019   HGBA1C 7.5 (H) 07/09/2019   HGBA1C 6.1 (A) 04/08/2019   Lab Results  Component Value Date   MICROALBUR <0.7 01/14/2019   LDLCALC 57 10/07/2019   CREATININE 0.89 10/07/2019   Lab Results  Component Value Date   MICRALBCREAT 1.0 01/14/2019    Lab Results  Component Value Date   FRUCTOSAMINE 263 03/07/2019   FRUCTOSAMINE 281  03/22/2018    Lab on 10/07/2019  Component Date Value Ref Range Status  . Cholesterol 10/07/2019 116  0 - 200 mg/dL Final   ATP III Classification       Desirable:  < 200 mg/dL               Borderline High:  200 - 239 mg/dL          High:  > = 240 mg/dL  . Triglycerides 10/07/2019 130.0  0.0 - 149.0 mg/dL Final   Normal:  <150 mg/dLBorderline High:  150 - 199 mg/dL  . HDL 10/07/2019 32.80* >39.00 mg/dL Final  . VLDL 10/07/2019 26.0  0.0 - 40.0 mg/dL Final  . LDL Cholesterol 10/07/2019 57  0 - 99 mg/dL Final  . Total CHOL/HDL Ratio 10/07/2019 4   Final                  Men          Women1/2 Average Risk     3.4          3.3Average Risk          5.0          4.42X Average Risk          9.6          7.13X Average Risk          15.0           11.0                      . NonHDL 10/07/2019 82.87   Final   NOTE:  Non-HDL goal should be 30 mg/dL higher than patient's LDL goal (i.e. LDL goal of < 70 mg/dL, would have non-HDL goal of < 100 mg/dL)  . Sodium 10/07/2019 141  135 - 145 mEq/L Final  . Potassium 10/07/2019 4.6  3.5 - 5.1 mEq/L Final  . Chloride 10/07/2019 105  96 - 112 mEq/L Final  . CO2 10/07/2019 31  19 - 32 mEq/L Final  . Glucose, Bld 10/07/2019 153* 70 - 99 mg/dL Final  . BUN 10/07/2019 14  6 - 23 mg/dL Final  . Creatinine, Ser 10/07/2019 0.89  0.40 - 1.50 mg/dL Final  . Total Bilirubin 10/07/2019 0.4  0.2 - 1.2 mg/dL Final  . Alkaline Phosphatase 10/07/2019 36* 39 - 117 U/L Final  . AST 10/07/2019 25  0 - 37 U/L Final  . ALT 10/07/2019 30  0 - 53 U/L Final  . Total Protein 10/07/2019 6.4  6.0 - 8.3 g/dL Final  . Albumin 10/07/2019 4.5  3.5 - 5.2 g/dL Final  . GFR 10/07/2019 85.38  >60.00 mL/min Final  . Calcium 10/07/2019 9.3  8.4 - 10.5 mg/dL Final  . Hgb A1c MFr Bld 10/07/2019 6.6* 4.6 - 6.5 % Final   Glycemic Control Guidelines for People with Diabetes:Non Diabetic:  <6%Goal of Therapy: <7%Additional Action Suggested:  >8%     Allergies as of 10/10/2019   No Known Allergies     Medication List       Accurate as of Oct 09, 2019  9:15 PM. If you have any questions, ask your nurse or doctor.        diclofenac 75 MG EC tablet Commonly known as: VOLTAREN Take 75 mg by mouth 2 (two) times daily.   folic acid 1 MG tablet Commonly known as: FOLVITE Take 1 mg by mouth daily.   glimepiride 4 MG tablet  Commonly known as: AMARYL TAKE 1 TABLET BY MOUTH DAILY WITH BREAKFAST What changed:   how much to take  how to take this  when to take this   Jardiance 25 MG Tabs tablet Generic drug: empagliflozin Take 25 mg by mouth daily.   loratadine 10 MG tablet Commonly known as: CLARITIN Take 10 mg by mouth daily.   metFORMIN 1000 MG tablet Commonly known as: GLUCOPHAGE TAKE 1 TABLET BY MOUTH TWICE  DAILY WITH MEAL   methotrexate (PF) 50 MG/2ML injection INJECT 1 ML ONCE A WEEK   OneTouch Delica Lancets Fine Misc Use as instructed by provider   OneTouch Verio test strip Generic drug: glucose blood CHECK BLOOD SUGAR ONCE A DAY ALTERNATING FASTING AND 2 HOURS AFTER A MEAL   rosuvastatin 20 MG tablet Commonly known as: CRESTOR TAKE 1 TABLET(20 MG) BY MOUTH DAILY   Trulicity 3 0000000 Sopn Generic drug: Dulaglutide Inject 3 mg into the skin once a week.       Allergies: No Known Allergies  Past Medical History:  Diagnosis Date  . Angioedema    he is unsure of this or cause  . 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 Diabetes mellitus type II, controlled, with no complications ([place])   . Hemorrhoids   . 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 Hyperlipidemia   . Hypertension   . Nephrolithiasis    1980s  . OSA (obstructive sleep apnea)   . PMR (polymyalgia rheumatica) ([facility])   . Shingles    2014    Past Surgical History:  Procedure Laterality Date  . APPENDECTOMY    . FINGER SURGERY  06/2012  . LAPAROSCOPIC GASTRIC BANDING  08/10/2009    Family History  Problem Relation Age of Onset  . Heart disease Mother   . Diabetes Maternal Grandmother     Social History:  reports that he has been smoking cigars. His smokeless tobacco use includes chew. He reports that he does not drink alcohol or use drugs.   Review of Systems   Lipid history: Followed by PCP, has adequate levels on Crestor 20 mg daily    Lab Results  Component Value Date   CHOL 116 10/07/2019   HDL 32.80 (L) 10/07/2019   LDLCALC 57 10/07/2019   LDLDIRECT 154.1 02/16/2010   TRIG 130.0 10/07/2019   CHOLHDL 4 10/07/2019           Hypertension: Has been off medications with using Invokana Blood pressure not recently checked   BP Readings from Last 3 Encounters:  04/08/19 130/70  04/03/19 102/78  07/24/18 96/64    Most recent eye exam was in 2020, negative for  retinopathy  Most recent foot exam: 10/19  Currently known complications of diabetes: None, needs urine microalbumin checked and follow-up eye exam  He is on methotrexate injections for his rheumatoid arthritis  LABS:  Lab on 10/07/2019  Component Date Value Ref Range Status  . Cholesterol 10/07/2019 116  0 - 200 mg/dL Final   ATP III Classification       Desirable:  < 200 mg/dL               Borderline High:  200 - 239 mg/dL          High:  > = 240 mg/dL  . Triglycerides 10/07/2019 130.0  0.0 - 149.0 mg/dL Final   Normal:  <150 mg/dLBorderline High:  150 - 199 mg/dL  . HDL 10/07/2019 32.80* >39.00 mg/dL Final  . VLDL 10/07/2019 26.0  0.0 - 40.0 mg/dL Final  . LDL Cholesterol 10/07/2019 57  0 - 99 mg/dL Final  . Total CHOL/HDL Ratio 10/07/2019 4   Final                  Men          Women1/2 Average Risk     3.4          3.3Average Risk          5.0          4.42X Average Risk          9.6          7.13X Average Risk          15.0          11.0                      . NonHDL 10/07/2019 82.87   Final   NOTE:  Non-HDL goal should be 30 mg/dL higher than patient's LDL goal (i.e. LDL goal of < 70 mg/dL, would have non-HDL goal of < 100 mg/dL)  . Sodium 10/07/2019 141  135 - 145 mEq/L Final  . Potassium 10/07/2019 4.6  3.5 - 5.1 mEq/L Final  . Chloride 10/07/2019 105  96 - 112 mEq/L Final  . CO2 10/07/2019 31  19 - 32 mEq/L Final  . Glucose, Bld 10/07/2019 153* 70 - 99 mg/dL Final  . BUN 10/07/2019 14  6 - 23 mg/dL Final  . Creatinine, Ser 10/07/2019 0.89  0.40 - 1.50 mg/dL Final  . Total Bilirubin 10/07/2019 0.4  0.2 - 1.2 mg/dL Final  . Alkaline Phosphatase 10/07/2019 36* 39 - 117 U/L Final  . AST 10/07/2019 25  0 - 37 U/L Final  . ALT 10/07/2019 30  0 - 53 U/L Final  . Total Protein 10/07/2019 6.4  6.0 - 8.3 g/dL Final  . Albumin 10/07/2019 4.5  3.5 - 5.2 g/dL Final  . GFR 10/07/2019 85.38  >60.00 mL/min Final  . Calcium 10/07/2019 9.3  8.4 - 10.5 mg/dL Final  . Hgb A1c MFr Bld  10/07/2019 6.6* 4.6 - 6.5 % Final   Glycemic Control Guidelines for People with Diabetes:Non Diabetic:  <6%Goal of Therapy: <7%Additional Action Suggested:  >8%     Physical Examination:  There were no vitals taken for this visit.    ASSESSMENT:  Diabetes type 2    See history of present illness for detailed discussion of current diabetes management, blood sugar patterns and problems identified  His A1c is back up at 7.5 even though it was 6.1 previously  Currently on a program of Invokana 300 mg, Amaryl 2 mg, Trulicity and Metformin  Not clear why his blood sugars are higher As before he is monitoring mostly in the morning and likely has postprandial hyperglycemia since his morning sugars are averaging at least less than 150 recently He does not think his diet has been poor but still has some intake of regular soft drinks He can do better with exercise  History of hyperlipidemia on Crestor, needs follow-up labs on his next visit  Need for Covid vaccination: He has not been able to schedule this and appears to be agreeable to doing the vaccine Discussed importance of doing this as soon as possible and this will also allow him to be going out for exercise He will try to sign up either at the [facility] health website or the [email or web address] website, advised him  to sign up for text alerts  PLAN:    Glucose monitoring: Reminded him to check his sugars after evening meal between 2 to 3 hours later Discussed that these sugars should be at least under 180 and preferably lower consistently He will try to avoid regular soft drinks completely Also if he finds certain foods making his sugar go up he will portions of these Encouraged him to start an indoor video exercise program on his computer Increase Trulicity to 3 mg after he takes his last dose of 1.5 this weekend  Call if blood sugars are consistently high  History of hypertension: He needs to check blood pressure regularly at  home  Follow-up in 3 months    There are no Patient Instructions on file for this visit.       Elayne Snare 10/09/2019, 9:15 PM   Note: This office note was prepared with Dragon voice recognition system technology. Any transcriptional errors that result from this process are unintentional.

## 2019-10-10 ENCOUNTER — Ambulatory Visit: Payer: Medicare Other | Admitting: Endocrinology

## 2019-10-10 DIAGNOSIS — Z0289 Encounter for other administrative examinations: Secondary | ICD-10-CM

## 2019-10-14 ENCOUNTER — Other Ambulatory Visit: Payer: Self-pay

## 2019-10-14 ENCOUNTER — Other Ambulatory Visit: Payer: Self-pay | Admitting: Endocrinology

## 2019-10-15 ENCOUNTER — Ambulatory Visit (INDEPENDENT_AMBULATORY_CARE_PROVIDER_SITE_OTHER): Payer: Medicare Other | Admitting: Endocrinology

## 2019-10-15 ENCOUNTER — Encounter: Payer: Self-pay | Admitting: Endocrinology

## 2019-10-15 VITALS — BP 112/70 | HR 88 | Ht 70.25 in | Wt 199.8 lb

## 2019-10-15 DIAGNOSIS — E1165 Type 2 diabetes mellitus with hyperglycemia: Secondary | ICD-10-CM

## 2019-10-15 DIAGNOSIS — E7800 Pure hypercholesterolemia, unspecified: Secondary | ICD-10-CM | POA: Diagnosis not present

## 2019-10-15 NOTE — Progress Notes (Signed)
Patient ID: Raymond Vasquez, male   DOB: [birth year], 69 y.o.   MRN: [id number]          Reason for Appointment: Follow-up for Type 2 Diabetes    History of Present Illness:          Date of diagnosis of type 2 diabetes mellitus: 2003 approximately       Recent history:   His A1c has improved again to 6.6 compared to 7.5   Non-insulin hypoglycemic drugs the patient is taking are: Metformin 1 g twice daily, Amaryl 2 mg in a.m., Trulicity 3 mg weekly, Jardiance 25 mg daily  Current management, blood sugar patterns and problems identified:  He has been given 3 mg Trulicity but his [id number] had gone up  Although he has no nausea from the Trulicity he thinks his desire to eat certain foods like meats has gone down and he is also eating smaller portions of meats; he does not think this is related to his increasing Trulicity but may be his taste  Again checking only FASTING blood sugars and not remembering to do readings after meals  Fasting readings overall are about the same and lab glucose was 153  He is still not exercising  His weight is gone down 4 pounds since his last in office visit in November  No difficulties with taking Jardiance       Side effects from medications have been: None    Typical meal intake: Breakfast is usually skipped, sometimes juice and muffin.  Sometimes will have regular soft drinks               Glucose monitoring:  done 1 times a day         Glucometer: One Touch .       Blood Glucose readings from patient reviewing monitor:   Range in the morning 118-178 with AVERAGE 137  No readings after meals  Dietician visit, most recent: Before his lap band surgery  Weight history: Maximum 280 in the past  Wt Readings from Last 3 Encounters:  10/15/19 199 lb 12.8 oz (90.6 kg)  04/08/19 203 lb 12.8 oz (92.4 kg)  04/03/19 203 lb 6.4 oz (92.3 kg)    Glycemic control:   Lab Results  Component Value Date   HGBA1C 6.6 (H) 10/07/2019   HGBA1C 7.5 (H)  07/09/2019   HGBA1C 6.1 (A) 04/08/2019   Lab Results  Component Value Date   MICROALBUR <0.7 01/14/2019   LDLCALC 57 10/07/2019   CREATININE 0.89 10/07/2019   Lab Results  Component Value Date   MICRALBCREAT 1.0 01/14/2019    Lab Results  Component Value Date   FRUCTOSAMINE 263 03/07/2019   FRUCTOSAMINE 281 03/22/2018    No visits with results within 1 Week(s) from this visit.  Latest known visit with results is:  Lab on 10/07/2019  Component Date Value Ref Range Status  . Cholesterol 10/07/2019 116  0 - 200 mg/dL Final   ATP III Classification       Desirable:  < 200 mg/dL               Borderline High:  200 - 239 mg/dL          High:  > = 240 mg/dL  . Triglycerides 10/07/2019 130.0  0.0 - 149.0 mg/dL Final   Normal:  <150 mg/dLBorderline High:  150 - 199 mg/dL  . HDL 10/07/2019 32.80* >39.00 mg/dL Final  . VLDL 10/07/2019 26.0  0.0 - 40.0 mg/dL  Final  . LDL Cholesterol 10/07/2019 57  0 - 99 mg/dL Final  . Total CHOL/HDL Ratio 10/07/2019 4   Final                  Men          Women1/2 Average Risk     3.4          3.3Average Risk          5.0          4.42X Average Risk          9.6          7.13X Average Risk          15.0          11.0                      . NonHDL 10/07/2019 82.87   Final   NOTE:  Non-HDL goal should be 30 mg/dL higher than patient's LDL goal (i.e. LDL goal of < 70 mg/dL, would have non-HDL goal of < 100 mg/dL)  . Sodium 10/07/2019 141  135 - 145 mEq/L Final  . Potassium 10/07/2019 4.6  3.5 - 5.1 mEq/L Final  . Chloride 10/07/2019 105  96 - 112 mEq/L Final  . CO2 10/07/2019 31  19 - 32 mEq/L Final  . Glucose, Bld 10/07/2019 153* 70 - 99 mg/dL Final  . BUN 10/07/2019 14  6 - 23 mg/dL Final  . Creatinine, Ser 10/07/2019 0.89  0.40 - 1.50 mg/dL Final  . Total Bilirubin 10/07/2019 0.4  0.2 - 1.2 mg/dL Final  . Alkaline Phosphatase 10/07/2019 36* 39 - 117 U/L Final  . AST 10/07/2019 25  0 - 37 U/L Final  . ALT 10/07/2019 30  0 - 53 U/L Final  . Total  Protein 10/07/2019 6.4  6.0 - 8.3 g/dL Final  . Albumin 10/07/2019 4.5  3.5 - 5.2 g/dL Final  . GFR 10/07/2019 85.38  >60.00 mL/min Final  . Calcium 10/07/2019 9.3  8.4 - 10.5 mg/dL Final  . Hgb A1c MFr Bld 10/07/2019 6.6* 4.6 - 6.5 % Final   Glycemic Control Guidelines for People with Diabetes:Non Diabetic:  <6%Goal of Therapy: <7%Additional Action Suggested:  >8%     Allergies as of 10/15/2019   No Known Allergies     Medication List       Accurate as of Oct 15, 2019  1:06 PM. If you have any questions, ask your nurse or doctor.        STOP taking these medications   loratadine 10 MG tablet Commonly known as: CLARITIN Stopped by: Elayne Snare, MD     TAKE these medications   diclofenac 75 MG EC tablet Commonly known as: VOLTAREN Take 75 mg by mouth 2 (two) times daily.   folic acid 1 MG tablet Commonly known as: FOLVITE Take 1 mg by mouth daily.   glimepiride 4 MG tablet Commonly known as: AMARYL TAKE 1 TABLET BY MOUTH DAILY WITH BREAKFAST What changed:   how much to take  how to take this  when to take this   Jardiance 25 MG Tabs tablet Generic drug: empagliflozin Take 25 mg by mouth daily.   metFORMIN 1000 MG tablet Commonly known as: GLUCOPHAGE TAKE 1 TABLET BY MOUTH TWICE DAILY WITH MEAL   methotrexate (PF) 50 MG/2ML injection INJECT 1 ML ONCE A WEEK   OneTouch Delica Lancets Fine Misc Use as instructed by provider  OneTouch Verio test strip Generic drug: glucose blood CHECK BLOOD SUGAR ONCE A DAY ALTERNATING FASTING AND 2 HOURS AFTER A MEAL   rosuvastatin 20 MG tablet Commonly known as: CRESTOR TAKE 1 TABLET(20 MG) BY MOUTH DAILY   Trulicity 3 0000000 Sopn Generic drug: Dulaglutide INJECT 3MG  INTO THE SKIN ONCE WEEKLY       Allergies: No Known Allergies  Past Medical History:  Diagnosis Date  . Angioedema    he is unsure of this or cause  . 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 Diabetes mellitus type II, controlled, with no complications ([place])    . Hemorrhoids   . 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 Hyperlipidemia   . Hypertension   . Nephrolithiasis    1980s  . OSA (obstructive sleep apnea)   . PMR (polymyalgia rheumatica) ([facility])   . Shingles    2014    Past Surgical History:  Procedure Laterality Date  . APPENDECTOMY    . FINGER SURGERY  06/2012  . LAPAROSCOPIC GASTRIC BANDING  08/10/2009    Family History  Problem Relation Age of Onset  . Heart disease Mother   . Diabetes Maternal Grandmother     Social History:  reports that he has been smoking cigars. His smokeless tobacco use includes chew. He reports that he does not drink alcohol or use drugs.   Review of Systems   Lipid history: Followed by PCP, has adequate levels on Crestor 20 mg daily    Lab Results  Component Value Date   CHOL 116 10/07/2019   HDL 32.80 (L) 10/07/2019   LDLCALC 57 10/07/2019   LDLDIRECT 154.1 02/16/2010   TRIG 130.0 10/07/2019   CHOLHDL 4 10/07/2019           Blood pressure history:  Not on medications  BP Readings from Last 3 Encounters:  10/15/19 112/70  04/08/19 130/70  04/03/19 102/78    Most recent eye exam was in 2020, negative for retinopathy  Most recent foot exam: 5/21  Currently known complications of diabetes: None, needs urine microalbumin checked and follow-up eye exam  He is on methotrexate injections for his rheumatoid arthritis  LABS:  No visits with results within 1 Week(s) from this visit.  Latest known visit with results is:  Lab on 10/07/2019  Component Date Value Ref Range Status  . Cholesterol 10/07/2019 116  0 - 200 mg/dL Final   ATP III Classification       Desirable:  < 200 mg/dL               Borderline High:  200 - 239 mg/dL          High:  > = 240 mg/dL  . Triglycerides 10/07/2019 130.0  0.0 - 149.0 mg/dL Final   Normal:  <150 mg/dLBorderline High:  150 - 199 mg/dL  . HDL 10/07/2019 32.80* >39.00 mg/dL Final  . VLDL 10/07/2019 26.0  0.0 -  40.0 mg/dL Final  . LDL Cholesterol 10/07/2019 57  0 - 99 mg/dL Final  . Total CHOL/HDL Ratio 10/07/2019 4   Final                  Men          Women1/2 Average Risk     3.4          3.3Average Risk          5.0          4.42X Average Risk  9.6          7.13X Average Risk          15.0          11.0                      . NonHDL 10/07/2019 82.87   Final   NOTE:  Non-HDL goal should be 30 mg/dL higher than patient's LDL goal (i.e. LDL goal of < 70 mg/dL, would have non-HDL goal of < 100 mg/dL)  . Sodium 10/07/2019 141  135 - 145 mEq/L Final  . Potassium 10/07/2019 4.6  3.5 - 5.1 mEq/L Final  . Chloride 10/07/2019 105  96 - 112 mEq/L Final  . CO2 10/07/2019 31  19 - 32 mEq/L Final  . Glucose, Bld 10/07/2019 153* 70 - 99 mg/dL Final  . BUN 10/07/2019 14  6 - 23 mg/dL Final  . Creatinine, Ser 10/07/2019 0.89  0.40 - 1.50 mg/dL Final  . Total Bilirubin 10/07/2019 0.4  0.2 - 1.2 mg/dL Final  . Alkaline Phosphatase 10/07/2019 36* 39 - 117 U/L Final  . AST 10/07/2019 25  0 - 37 U/L Final  . ALT 10/07/2019 30  0 - 53 U/L Final  . Total Protein 10/07/2019 6.4  6.0 - 8.3 g/dL Final  . Albumin 10/07/2019 4.5  3.5 - 5.2 g/dL Final  . GFR 10/07/2019 85.38  >60.00 mL/min Final  . Calcium 10/07/2019 9.3  8.4 - 10.5 mg/dL Final  . Hgb A1c MFr Bld 10/07/2019 6.6* 4.6 - 6.5 % Final   Glycemic Control Guidelines for People with Diabetes:Non Diabetic:  <6%Goal of Therapy: <7%Additional Action Suggested:  >8%     Physical Examination:  BP 112/70 (BP Location: Left Arm, Patient Position: Sitting, Cuff Size: Normal)   Pulse 88   Ht 5' 10.25" (1.784 m)   Wt 199 lb 12.8 oz (90.6 kg)   SpO2 98%   BMI 28.46 kg/m   Diabetic Foot Exam - Simple   Simple Foot Form Diabetic Foot exam was performed with the following findings: Yes   Visual Inspection No deformities, no ulcerations, no other skin breakdown bilaterally: Yes Sensation Testing Intact to touch and monofilament testing bilaterally:  Yes Pulse Check Posterior Tibialis and Dorsalis pulse intact bilaterally: Yes Comments      ASSESSMENT:  Diabetes type 2    See history of present illness for detailed discussion of current diabetes management, blood sugar patterns and problems identified  His A1c is improved at 6.6 compared to 7.5  Now taking Jardiance 25 mg,, Amaryl 2 mg, Trulicity 3 mg and Metformin  Although his blood sugars are not looking different in the morning compared to his last visit he likely has less postprandial hyperglycemia with increasing his Trulicity and is cutting back on portions He does not think he is having side effects from Trulicity although sometimes only eating 1 meal a day Also has some limited choices because of his LAP-BAND and he thinks he sometimes will eat high fat meals Not exercising  Lipids: Well controlled with Crestor He will continue the same dose of 20 mg  Blood pressure is normal without medications, likely benefiting from [place] and improve diet  PLAN:    Glucose monitoring: Emphasized the need to check readings after evening meal Discussed blood sugar targets both morning and after meals Switch Amaryl to the evening at dinnertime Continue Trulicity 3 mg Start walking or doing other exercises regularly for aerobic activity  Follow-up  in 3 months    Patient Instructions  Check blood sugars on waking up 2-3 days a week  Also check blood sugars about 2 hours after meals and do this after different meals by rotation  Recommended blood sugar levels on waking up are 90-130 and about 2 hours after meal is 130-160  Please bring your blood sugar monitor to each visit, thank you  Change Glimeperide 1/2 pill to supper instead of ams          Elayne Snare 10/15/2019, 1:06 PM   Note: This office note was prepared with Dragon voice recognition system technology. Any transcriptional errors that result from this process are unintentional.

## 2019-10-15 NOTE — Patient Instructions (Addendum)
Check blood sugars on waking up 2-3 days a week  Also check blood sugars about 2 hours after meals and do this after different meals by rotation  Recommended blood sugar levels on waking up are 90-130 and about 2 hours after meal is 130-160  Please bring your blood sugar monitor to each visit, thank you  Change Glimeperide 1/2 pill to supper instead of ams

## 2019-10-25 ENCOUNTER — Other Ambulatory Visit: Payer: Self-pay | Admitting: Endocrinology

## 2019-10-31 ENCOUNTER — Other Ambulatory Visit: Payer: Self-pay | Admitting: Endocrinology

## 2020-01-12 ENCOUNTER — Other Ambulatory Visit: Payer: Self-pay | Admitting: Endocrinology

## 2020-01-13 ENCOUNTER — Other Ambulatory Visit: Payer: Self-pay

## 2020-01-13 ENCOUNTER — Other Ambulatory Visit (INDEPENDENT_AMBULATORY_CARE_PROVIDER_SITE_OTHER): Payer: Medicare Other

## 2020-01-13 DIAGNOSIS — E1165 Type 2 diabetes mellitus with hyperglycemia: Secondary | ICD-10-CM

## 2020-01-13 LAB — BASIC METABOLIC PANEL
BUN: 14 mg/dL (ref 6–23)
CO2: 30 mEq/L (ref 19–32)
Calcium: 10 mg/dL (ref 8.4–10.5)
Chloride: 103 mEq/L (ref 96–112)
Creatinine, Ser: 0.78 mg/dL (ref 0.40–1.50)
GFR: 99.33 mL/min (ref >60.00)
Glucose, Bld: 139 mg/dL — ABNORMAL HIGH (ref 70–99)
Potassium: 4.4 mEq/L (ref 3.5–5.1)
Sodium: 141 mEq/L (ref 135–145)

## 2020-01-13 LAB — MICROALBUMIN / CREATININE URINE RATIO
Creatinine,U: 41.8 mg/dL
Microalb Creat Ratio: 1.7 mg/g (ref 0.0–30.0)
Microalb, Ur: <0.7 mg/dL (ref 0.0–1.9)

## 2020-01-13 LAB — HEMOGLOBIN A1C: Hgb A1c MFr Bld: 6.4 % (ref 4.6–6.5)

## 2020-01-16 ENCOUNTER — Encounter: Payer: Self-pay | Admitting: Endocrinology

## 2020-01-16 ENCOUNTER — Ambulatory Visit (INDEPENDENT_AMBULATORY_CARE_PROVIDER_SITE_OTHER): Payer: Medicare Other | Admitting: Endocrinology

## 2020-01-16 ENCOUNTER — Other Ambulatory Visit: Payer: Self-pay

## 2020-01-16 VITALS — BP 112/72 | HR 65 | Ht 70.0 in | Wt 196.0 lb

## 2020-01-16 DIAGNOSIS — E1169 Type 2 diabetes mellitus with other specified complication: Secondary | ICD-10-CM | POA: Diagnosis not present

## 2020-01-16 DIAGNOSIS — E669 Obesity, unspecified: Secondary | ICD-10-CM | POA: Diagnosis not present

## 2020-01-16 NOTE — Patient Instructions (Addendum)
Check blood sugars on waking up 3-4 days a week  Also check blood sugars about 2 hours after meals and do this after different meals by rotation  Recommended blood sugar levels on waking up are 90-130 and about 2 hours after meal is 130-160  Please bring your blood sugar monitor to each visit, thank you  Start walking daily

## 2020-01-16 NOTE — Progress Notes (Signed)
Patient ID: Raymond Vasquez, male   DOB: 12-26-1950, 69 y.o.   MRN: [id number]          Reason for Appointment: Follow-up for Type 2 Diabetes    History of Present Illness:          Date of diagnosis of type 2 diabetes mellitus: 2003 approximately       Recent history:   His A1c has improved again to 6.4   Non-insulin hypoglycemic drugs the patient is taking are: Metformin 1 g twice daily, Amaryl 2 mg in a.m., Trulicity 3 mg weekly, Jardiance 25 mg daily  Current management, blood sugar patterns and problems identified:  He has been benefiting from the 3 mg Trulicity doses with improved sugars  However not clear what his postprandial readings are again as he forgets to check them  Recently he is trying to do a lot more walking and other activities and has gradually lost some weight also  No hypoglycemia with morning Amaryl  Renal function again stable with Jardiance 25 mg  He has been told to cut back on regular soft drinks       Side effects from medications have been: None    Typical meal intake: Breakfast is usually skipped, sometimes juice and muffin. Occasionally will have regular soft drinks               Glucose monitoring:  done 1 times a day         Glucometer: One Touch Verio.       Blood Glucose readings from monitor download:  FASTING blood sugar range 91-153, average 118  Previously: Range in the morning 118-178 with AVERAGE 137  No readings after meals  Dietician visit, most recent: Before his lap band surgery  Weight history: Maximum 280 in the past  Wt Readings from Last 3 Encounters:  01/16/20 196 lb (88.9 kg)  10/15/19 199 lb 12.8 oz (90.6 kg)  04/08/19 203 lb 12.8 oz (92.4 kg)    Glycemic control:   Lab Results  Component Value Date   HGBA1C 6.4 01/13/2020   HGBA1C 6.6 (H) 10/07/2019   HGBA1C 7.5 (H) 07/09/2019   Lab Results  Component Value Date   MICROALBUR <0.7 01/13/2020   LDLCALC 57 10/07/2019   CREATININE 0.78 01/13/2020    Lab Results  Component Value Date   MICRALBCREAT 1.7 01/13/2020    Lab Results  Component Value Date   FRUCTOSAMINE 263 03/07/2019   FRUCTOSAMINE 281 03/22/2018    Lab on 01/13/2020  Component Date Value Ref Range Status  . Microalb, Ur 01/13/2020 <0.7  0.0 - 1.9 mg/dL Final  . Creatinine,U 01/13/2020 41.8  mg/dL Final  . Microalb Creat Ratio 01/13/2020 1.7  0.0 - 30.0 mg/g Final  . Sodium 01/13/2020 141  135 - 145 mEq/L Final  . Potassium 01/13/2020 4.4  3.5 - 5.1 mEq/L Final  . Chloride 01/13/2020 103  96 - 112 mEq/L Final  . CO2 01/13/2020 30  19 - 32 mEq/L Final  . Glucose, Bld 01/13/2020 139* 70 - 99 mg/dL Final  . BUN 01/13/2020 14  6 - 23 mg/dL Final  . Creatinine, Ser 01/13/2020 0.78  0.40 - 1.50 mg/dL Final  . GFR 01/13/2020 99.33  >60.00 mL/min Final  . Calcium 01/13/2020 10.0  8.4 - 10.5 mg/dL Final  . Hgb A1c MFr Bld 01/13/2020 6.4  4.6 - 6.5 % Final   Glycemic Control Guidelines for People with Diabetes:Non Diabetic:  <6%Goal of Therapy: <7%Additional Action  Suggested:  >8%     Allergies as of 01/16/2020   No Known Allergies     Medication List       Accurate as of January 16, 2020 11:59 PM. If you have any questions, ask your nurse or doctor.        diclofenac 75 MG EC tablet Commonly known as: VOLTAREN Take 75 mg by mouth 2 (two) times daily.   folic acid 1 MG tablet Commonly known as: FOLVITE Take 1 mg by mouth daily.   glimepiride 4 MG tablet Commonly known as: AMARYL Take 0.5 tablets (2 mg total) by mouth daily with supper.   Jardiance 25 MG Tabs tablet Generic drug: empagliflozin TAKE 1 TABLET BY MOUTH DAILY   metFORMIN 1000 MG tablet Commonly known as: GLUCOPHAGE TAKE 1 TABLET BY MOUTH TWICE DAILY WITH MEAL   methotrexate (PF) 50 MG/2ML injection INJECT 1 ML ONCE A WEEK   OneTouch Delica Lancets Fine Misc Use as instructed by provider   OneTouch Verio test strip Generic drug: glucose blood CHECK BLOOD SUGAR ONCE A DAY  ALTERNATING FASTING AND 2 HOURS AFTER A MEAL   rosuvastatin 20 MG tablet Commonly known as: CRESTOR TAKE 1 TABLET(20 MG) BY MOUTH DAILY   Trulicity 3 AU/6.3FH Sopn Generic drug: Dulaglutide INJECT 3MG  INTO THE SKIN ONCE WEEKLY       Allergies: No Known Allergies  Past Medical History:  Diagnosis Date  . Angioedema    he is unsure of this or cause  . 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 Diabetes mellitus type II, controlled, with no complications ([place])   . Hemorrhoids   . 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 Hyperlipidemia   . Hypertension   . Nephrolithiasis    1980s  . OSA (obstructive sleep apnea)   . PMR (polymyalgia rheumatica) ([facility])   . Shingles    2014    Past Surgical History:  Procedure Laterality Date  . APPENDECTOMY    . FINGER SURGERY  06/2012  . LAPAROSCOPIC GASTRIC BANDING  08/10/2009    Family History  Problem Relation Age of Onset  . Heart disease Mother   . Diabetes Maternal Grandmother     Social History:  reports that he has been smoking cigars. His smokeless tobacco use includes chew. He reports that he does not drink alcohol and does not use drugs.   Review of Systems   Lipid history: Followed by PCP, has adequate levels on Crestor 20 mg daily    Lab Results  Component Value Date   CHOL 116 10/07/2019   HDL 32.80 (L) 10/07/2019   LDLCALC 57 10/07/2019   LDLDIRECT 154.1 02/16/2010   TRIG 130.0 10/07/2019   CHOLHDL 4 10/07/2019           Blood pressure history:  Not on medications  BP Readings from Last 3 Encounters:  01/16/20 112/72  10/15/19 112/70  04/08/19 130/70    Most recent eye exam was in 2020, negative for retinopathy  Most recent foot exam: 5/21  Currently known complications of diabetes: None, needs urine microalbumin checked and follow-up eye exam  He is on methotrexate injections for his rheumatoid arthritis  LABS:  Lab on 01/13/2020  Component Date Value Ref Range Status  .  Microalb, Ur 01/13/2020 <0.7  0.0 - 1.9 mg/dL Final  . Creatinine,U 01/13/2020 41.8  mg/dL Final  . Microalb Creat Ratio 01/13/2020 1.7  0.0 - 30.0 mg/g Final  . Sodium 01/13/2020 141  135 - 145 mEq/L  Final  . Potassium 01/13/2020 4.4  3.5 - 5.1 mEq/L Final  . Chloride 01/13/2020 103  96 - 112 mEq/L Final  . CO2 01/13/2020 30  19 - 32 mEq/L Final  . Glucose, Bld 01/13/2020 139* 70 - 99 mg/dL Final  . BUN 01/13/2020 14  6 - 23 mg/dL Final  . Creatinine, Ser 01/13/2020 0.78  0.40 - 1.50 mg/dL Final  . GFR 01/13/2020 99.33  >60.00 mL/min Final  . Calcium 01/13/2020 10.0  8.4 - 10.5 mg/dL Final  . Hgb A1c MFr Bld 01/13/2020 6.4  4.6 - 6.5 % Final   Glycemic Control Guidelines for People with Diabetes:Non Diabetic:  <6%Goal of Therapy: <7%Additional Action Suggested:  >8%     Physical Examination:  BP 112/72 (BP Location: Left Arm, Patient Position: Sitting, Cuff Size: Normal)   Pulse 65   Ht 5\' 10"  (1.778 m)   Wt 196 lb (88.9 kg)   SpO2 97%   BMI 28.12 kg/m    ASSESSMENT:  Diabetes type 2    See history of present illness for detailed discussion of current diabetes management, blood sugar patterns and problems identified  His A1c is improved at 6.4  For his diabetes he is taking Jardiance 25 mg,, Amaryl 2 mg, Trulicity 3 mg and Metformin  His morning sugars are excellent and overall control is better along with some weight loss Weight is coming down with continued high-dose Trulicity which he is tolerating However can do more walking for exercise  PLAN:    He has been encouraged him to exercise with various activities or walking and do this regularly He will remind himself to check blood sugars after meals at night No change in medications To call if he is having tendency to hypoglycemia, discussed that this may be potentially from Amaryl  Follow-up in 4 months    Patient Instructions  Check blood sugars on waking up 3-4 days a week  Also check blood sugars about 2  hours after meals and do this after different meals by rotation  Recommended blood sugar levels on waking up are 90-130 and about 2 hours after meal is 130-160  Please bring your blood sugar monitor to each visit, thank you  Start walking daily          Elayne Snare 01/18/2020, 2:46 PM   Note: This office note was prepared with Dragon voice recognition system technology. Any transcriptional errors that result from this process are unintentional.

## 2020-01-29 ENCOUNTER — Other Ambulatory Visit: Payer: Self-pay | Admitting: Endocrinology

## 2020-01-31 ENCOUNTER — Encounter: Payer: Self-pay | Admitting: Family Medicine

## 2020-01-31 ENCOUNTER — Other Ambulatory Visit: Payer: Self-pay

## 2020-01-31 ENCOUNTER — Ambulatory Visit (INDEPENDENT_AMBULATORY_CARE_PROVIDER_SITE_OTHER): Payer: Medicare Other | Admitting: Family Medicine

## 2020-01-31 VITALS — BP 124/80 | HR 84 | Temp 97.6°F | Ht 70.0 in | Wt 198.2 lb

## 2020-01-31 DIAGNOSIS — M069 Rheumatoid arthritis, unspecified: Secondary | ICD-10-CM

## 2020-01-31 DIAGNOSIS — I10 Essential (primary) hypertension: Secondary | ICD-10-CM

## 2020-01-31 DIAGNOSIS — Z23 Encounter for immunization: Secondary | ICD-10-CM

## 2020-01-31 DIAGNOSIS — E1159 Type 2 diabetes mellitus with other circulatory complications: Secondary | ICD-10-CM

## 2020-01-31 DIAGNOSIS — E1169 Type 2 diabetes mellitus with other specified complication: Secondary | ICD-10-CM | POA: Diagnosis not present

## 2020-01-31 DIAGNOSIS — E785 Hyperlipidemia, unspecified: Secondary | ICD-10-CM

## 2020-01-31 DIAGNOSIS — I152 Hypertension secondary to endocrine disorders: Secondary | ICD-10-CM

## 2020-01-31 NOTE — Progress Notes (Signed)
Raymond Vasquez DOB: Jun 21, 1950 Encounter date: 01/31/2020  This is a 69 y.o. male who presents with Chief Complaint  Patient presents with  . Follow-up    History of present illness: Had surgery in feb with Para March - had rotator cuff/bicep repair and then reinjured lifting dog in emergency situation and had to return to surgery. Lost 30-40% strength in arm. Diclofenac BID is helping a lot in addition to shot. Still sensitivity in bicep. Just careful with lifting; doesn't push it. Much better than pain he was having in 2019. So much better than when he was here last time.  A month ago was sitting on patio and right foot went to sleep - put all weight on foot and twisted ankle. So painful he was crying. That was Friday. Sat, Sunday, Monday. Whole foot swelled up. A week later saw his specialist and had eval. Still very tender. Wearing soft brace for support. Getting around ok now.   Saw Dr. Dwyane Dee 8/26 for diabetes management -A1c was improved at that time to 6.4.  Patient currently on Metformin, Amaryl, Trulicity, Jardiance. Glimepiride half tab at supper really has brought down sugars.   Last visit with me was 03/2019  Hypertension-lisinopril 5 mg - stopped by Dr. Dwyane Dee per patient.   Hyperlipidemia-Crestor 20 mg  Rheumatoid arthritis-sees Dr. Kelton Pillar. On methotrexate. Doing well from this standpoint.    Last eye exam in 2020. No retinopathy.  No Known Allergies Current Meds  Medication Sig  . diclofenac (VOLTAREN) 75 MG EC tablet Take 75 mg by mouth 2 (two) times daily.   . folic acid (FOLVITE) 1 MG tablet Take 1 mg by mouth daily.  Marland Kitchen glimepiride (AMARYL) 4 MG tablet Take 0.5 tablets (2 mg total) by mouth daily with supper.  Marland Kitchen JARDIANCE 25 MG TABS tablet TAKE 1 TABLET BY MOUTH DAILY  . metFORMIN (GLUCOPHAGE) 1000 MG tablet TAKE 1 TABLET BY MOUTH TWICE DAILY WITH MEAL  . Methotrexate Sodium (METHOTREXATE, PF,) 50 MG/2ML injection INJECT 1 ML ONCE A WEEK  . ONETOUCH DELICA  LANCETS FINE MISC Use as instructed by provider  . ONETOUCH VERIO test strip CHECK BLOOD SUGAR ONCE A DAY ALTERNATING FASTING AND 2 HOURS AFTER A MEAL  . rosuvastatin (CRESTOR) 20 MG tablet TAKE 1 TABLET(20 MG) BY MOUTH DAILY  . TRULICITY 3 RK/2.7CW SOPN INJECT 3MG  INTO THE SKIN ONCE WEEKLY   Current Facility-Administered Medications for the 01/31/20 encounter (Office Visit) with Caren Macadam, MD  Medication  . 0.9 %  sodium chloride infusion  . 0.9 %  sodium chloride infusion    Review of Systems  Constitutional: Negative for chills, fatigue and fever.  Respiratory: Negative for cough, chest tightness, shortness of breath and wheezing.   Cardiovascular: Negative for chest pain, palpitations and leg swelling.    Objective:  BP 124/80 (BP Location: Left Arm, Patient Position: Sitting, Cuff Size: Large)   Pulse 84   Temp 97.6 F (36.4 C) (Oral)   Ht 5\' 10"  (1.778 m)   Wt 198 lb 3.2 oz (89.9 kg)   BMI 28.44 kg/m   Weight: 198 lb 3.2 oz (89.9 kg)   BP Readings from Last 3 Encounters:  01/31/20 124/80  01/16/20 112/72  10/15/19 112/70   Wt Readings from Last 3 Encounters:  01/31/20 198 lb 3.2 oz (89.9 kg)  01/16/20 196 lb (88.9 kg)  10/15/19 199 lb 12.8 oz (90.6 kg)    Physical Exam Constitutional:      General: He is not in  acute distress.    Appearance: He is well-developed.  HENT:     Head: Normocephalic and atraumatic.  Cardiovascular:     Rate and Rhythm: Normal rate and regular rhythm.     Heart sounds: Normal heart sounds. No murmur heard.   Pulmonary:     Effort: Pulmonary effort is normal.     Breath sounds: Normal breath sounds.  Abdominal:     General: Bowel sounds are normal. There is no distension.     Palpations: Abdomen is soft.     Tenderness: There is no abdominal tenderness. There is no guarding.  Skin:    General: Skin is warm and dry.     Comments: Sensory exam of the foot is normal, tested with the monofilament. Good pulses, no lesions  or ulcers, good peripheral pulses.  Psychiatric:        Judgment: Judgment normal.     Assessment/Plan  1. Hypertension associated with diabetes ([place]) Well-controlled.  Continue current medications.  2. Hyperlipidemia associated with type 2 diabetes mellitus ([place]) Well-controlled.  Continue Crestor.  3. Rheumatoid arthritis involving multiple sites, unspecified whether rheumatoid factor present ([place]) Joint pain is doing better.  Follows with rheumatology regularly and is on methotrexate.  4. Need for immunization against influenza - Flu Vaccine QUAD High Dose(Fluad) He plans to get pneumonia vaccine up-to-date at next dermatology visit.  Return in about 6 months (around 07/30/2020).      Micheline Rough, MD

## 2020-01-31 NOTE — Patient Instructions (Addendum)
Can go to [email or web address] to set up 3rd COVID vaccine.   Did you get second shingles vaccine? I just have single first dose of shingrix 02/2019 in my records.

## 2020-02-18 ENCOUNTER — Telehealth: Payer: Self-pay | Admitting: Family Medicine

## 2020-02-18 ENCOUNTER — Other Ambulatory Visit: Payer: Self-pay | Admitting: Endocrinology

## 2020-02-18 MED ORDER — ONETOUCH VERIO VI STRP: ORAL_STRIP | 2 refills | Status: DC

## 2020-02-18 NOTE — Telephone Encounter (Signed)
ONETOUCH VERIO test strip  [facility] [phone] - [place], [address] [address] Phone:  [phone]  Fax:  [phone]       Patient would like to have 2 tubes of strips sent instead of 1.  Please advise

## 2020-02-18 NOTE — Telephone Encounter (Signed)
Rx done. 

## 2020-02-29 ENCOUNTER — Other Ambulatory Visit: Payer: Self-pay | Admitting: Family Medicine

## 2020-03-03 ENCOUNTER — Other Ambulatory Visit: Payer: Self-pay

## 2020-03-03 ENCOUNTER — Ambulatory Visit (INDEPENDENT_AMBULATORY_CARE_PROVIDER_SITE_OTHER): Payer: Medicare Other

## 2020-03-03 DIAGNOSIS — Z0000 Encounter for general adult medical examination without abnormal findings: Secondary | ICD-10-CM

## 2020-03-03 NOTE — Progress Notes (Signed)
Virtual Visit via Telephone Note  I connected with  [name] on 03/03/20 at  3:15 PM EDT by telephone and verified that I am speaking with the correct person using two identifiers.  Medicare Annual Wellness visit completed telephonically due to Covid-19 pandemic.   Persons participating in this call: This Health Coach and this patient.   Location: Patient: Home  Provider: Office   I discussed the limitations, risks, security and privacy concerns of performing an evaluation and management service by telephone and the availability of in person appointments. The patient expressed understanding and agreed to proceed.  Unable to perform video visit due to video visit attempted and failed and/or patient does not have video capability.   Some vital signs may be absent or patient reported.   Willette Brace, LPN    Subjective:   MAMORU TAKESHITA is a 69 y.o. male who presents for Medicare Annual/Subsequent preventive examination.  Review of Systems     Cardiac Risk Factors include: diabetes mellitus;dyslipidemia;hypertension;male gender     Objective:    There were no vitals filed for this visit. There is no height or weight on file to calculate BMI.  Advanced Directives 03/03/2020 03/30/2015  Does Patient Have a Medical Advance Directive? No No  Would patient like information on creating a medical advance directive? (No Data) No - patient declined information    Current Medications (verified) Outpatient Encounter Medications as of 03/03/2020  Medication Sig  . diclofenac (VOLTAREN) 75 MG EC tablet Take 75 mg by mouth 2 (two) times daily.   . folic acid (FOLVITE) 1 MG tablet Take 1 mg by mouth daily.  Marland Kitchen glimepiride (AMARYL) 4 MG tablet Take 0.5 tablets (2 mg total) by mouth daily with supper.  Marland Kitchen glucose blood (ONETOUCH VERIO) test strip Use as instructed  . JARDIANCE 25 MG TABS tablet TAKE 1 TABLET BY MOUTH DAILY  . metFORMIN (GLUCOPHAGE) 1000 MG tablet TAKE 1 TABLET BY  MOUTH TWICE DAILY WITH MEAL  . Methotrexate Sodium (METHOTREXATE, PF,) 50 MG/2ML injection INJECT 1 ML ONCE A WEEK  . ONETOUCH DELICA LANCETS FINE MISC Use as instructed by provider  . rosuvastatin (CRESTOR) 20 MG tablet TAKE 1 TABLET(20 MG) BY MOUTH DAILY  . TRULICITY 3 LZ/7.6BH SOPN INJECT 3MG  INTO THE SKIN ONCE WEEKLY   Facility-Administered Encounter Medications as of 03/03/2020  Medication  . 0.9 %  sodium chloride infusion  . 0.9 %  sodium chloride infusion    Allergies (verified) Patient has no known allergies.   History: Past Medical History:  Diagnosis Date  . Angioedema    he is unsure of this or cause  . 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 Diabetes mellitus type II, controlled, with no complications ([place])   . Hemorrhoids   . 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 Hyperlipidemia   . Hypertension   . Nephrolithiasis    1980s  . OSA (obstructive sleep apnea)   . PMR (polymyalgia rheumatica) ([facility])   . Shingles    2014   Past Surgical History:  Procedure Laterality Date  . APPENDECTOMY    . FINGER SURGERY  06/2012  . LAPAROSCOPIC GASTRIC BANDING  08/10/2009  . ROTATOR CUFF REPAIR Right 06/2019   Dr Noemi Chapel   Family History  Problem Relation Age of Onset  . Heart disease Mother   . Diabetes Maternal Grandmother    Social History   Socioeconomic History  . Marital status: Married    Spouse name: Not on file  .  Number of children: Not on file  . Years of education: Not on file  . Highest education level: Not on file  Occupational History  . Occupation: retired  Tobacco Use  . Smoking status: Current Some Day Smoker    Types: Cigars  . Smokeless tobacco: Current User    Types: Chew  . Tobacco comment: 1 cigar occassionally, not more then 1x per week  Vaping Use  . Vaping Use: Never used  Substance and Sexual Activity  . Alcohol use: No  . Drug use: No  . Sexual activity: Not on file  Other Topics Concern  . Not on file    Social History Narrative   Work or School: retired Armed forces technical officer with [employer] Situation: lives with wifes      Spiritual Beliefs: none      Lifestyle: no regular exercise, healthy diet      Social Determinants of Health   Financial Resource Strain: Low Risk   . Difficulty of Paying Living Expenses: Not hard at all  Food Insecurity: No Food Insecurity  . Worried About Charity fundraiser in the Last Year: Never true  . Ran Out of Food in the Last Year: Never true  Transportation Needs: No Transportation Needs  . Lack of Transportation (Medical): No  . Lack of Transportation (Non-Medical): No  Physical Activity: Inactive  . Days of Exercise per Week: 0 days  . Minutes of Exercise per Session: 0 min  Stress: No Stress Concern Present  . Feeling of Stress : Not at all  Social Connections: Moderately Isolated  . Frequency of Communication with Friends and Family: Once a week  . Frequency of Social Gatherings with Friends and Family: Twice a week  . Attends Religious Services: Never  . Active Member of Clubs or Organizations: No  . Attends Archivist Meetings: Never  . Marital Status: Married    Tobacco Counseling Ready to quit: Not Answered Counseling given: Not Answered Comment: 1 cigar occassionally, not more then 1x per week   Clinical Intake:  Pre-visit preparation completed: Yes  Pain : No/denies pain     BMI - recorded: 28.44 Nutritional Status: BMI 25 -29 Overweight Diabetes: Yes CBG done?: Yes (119) CBG resulted in Enter/ Edit results?: No Did pt. bring in CBG monitor from home?: No  How often do you need to have someone help you when you read instructions, pamphlets, or other written materials from your doctor or pharmacy?: 1 - Never  Diabetic?Yes  Interpreter Needed?: No  Information entered by :: Charlott Rakes, LPN   Activities of Daily Living In your present state of health, do you have any difficulty  performing the following activities: 03/03/2020  Hearing? N  Vision? N  Difficulty concentrating or making decisions? N  Walking or climbing stairs? N  Dressing or bathing? N  Doing errands, shopping? N  Preparing Food and eating ? N  Using the Toilet? N  In the past six months, have you accidently leaked urine? N  Do you have problems with loss of bowel control? N  Managing your Medications? N  Managing your Finances? N  Some recent data might be hidden    Patient Care Team: Caren Macadam, MD as PCP - General (Family Medicine) Elayne Snare, MD as Consulting Physician (Endocrinology) Marvene Staff (Family Medicine)  Indicate any recent Medical Services you may have received from other than [facility] providers in the past year (  date may be approximate).     Assessment:   This is a routine wellness examination for [place].  Hearing/Vision screen  Hearing Screening   125Hz  250Hz  500Hz  1000Hz  2000Hz  3000Hz  4000Hz  6000Hz  8000Hz   Right ear:           Left ear:           Comments: Pt denies any hearing difficulty at this time  Vision Screening Comments: Will follow up with eye dr in [place] this upcoming year based on insurance  Dietary issues and exercise activities discussed: Current Exercise Habits: The patient does not participate in regular exercise at present  Goals    . Patient Stated     Exercise      Depression Screen PHQ 2/9 Scores 03/03/2020 02/28/2019 07/24/2018 07/24/2018 03/30/2015  PHQ - 2 Score 0 0 0 0 0  PHQ- 9 Score - - 1 - -    Fall Risk Fall Risk  03/03/2020 02/28/2019 02/28/2019 07/24/2018 03/30/2015  Falls in the past year? 0 1 0 0 No  Number falls in past yr: 0 0 0 0 -  Injury with Fall? 0 1 0 0 -  Risk for fall due to : Impaired vision;Impaired balance/gait - - - -  Risk for fall due to: Comment vertigo at times if bending over - - - -  Follow up Falls prevention discussed - - - -    Any stairs in or around the home? Yes  If so, are there any  without handrails? No  Home free of loose throw rugs in walkways, pet beds, electrical cords, etc? Yes  Adequate lighting in your home to reduce risk of falls? Yes   ASSISTIVE DEVICES UTILIZED TO PREVENT FALLS:  Life alert? No  Use of a cane, walker or w/c? No  Grab bars in the bathroom? No  Shower chair or bench in shower? No  Elevated toilet seat or a handicapped toilet? No   TIMED UP AND GO:  Was the test performed? No .      Cognitive Function:     6CIT Screen 03/03/2020  What Year? 0 points  What month? 0 points  Count back from 20 0 points  Months in reverse 0 points  Repeat phrase 0 points    Immunizations Immunization History  Administered Date(s) Administered  . Fluad Quad(high Dose 65+) 04/03/2019, 01/31/2020  . Influenza Whole 02/20/2009, 02/21/2011  . Influenza, High Dose Seasonal PF 06/05/2018  . Influenza-Unspecified 01/22/2016, 02/20/2017  . PFIZER SARS-COV-2 Vaccination 07/30/2019, 08/30/2019  . Pneumococcal Conjugate-13 08/14/2013, 03/21/2014  . Pneumococcal Polysaccharide-23 11/20/2014  . Tdap 07/14/2011  . Zoster 08/14/2013  . Zoster Recombinat (Shingrix) 03/06/2019    TDAP status: Up to date Flu Vaccine status: Up to date Pneumococcal vaccine status: Up to date Covid-19 vaccine status: Completed vaccines  Qualifies for Shingles Vaccine? Yes   Zostavax completed Yes   Shingrix Completed?: Yes  Screening Tests Health Maintenance  Topic Date Due  . OPHTHALMOLOGY EXAM  06/27/2019  . PNA vac Low Risk Adult (2 of 2 - PPSV23) 11/20/2019  . HEMOGLOBIN A1C  07/15/2020  . FOOT EXAM  10/14/2020  . COLONOSCOPY  11/15/2020  . TETANUS/TDAP  07/13/2021  . INFLUENZA VACCINE  Completed  . COVID-19 Vaccine  Completed  . Hepatitis C Screening  Completed    Health Maintenance  Health Maintenance Due  Topic Date Due  . OPHTHALMOLOGY EXAM  06/27/2019  . PNA vac Low Risk Adult (2 of 2 - PPSV23) 11/20/2019  Colorectal cancer screening:  Completed 11/15/17. Repeat every 3 years  Additional Screening:  Hepatitis C Screening:  Completed 03/05/15  Vision Screening: Recommended annual ophthalmology exams for early detection of glaucoma and other disorders of the eye. Is the patient up to date with their annual eye exam?  Yes  Who is the provider or what is the name of the office in which the patient attends annual eye exams? Looking for Eye care in [place] in feb 2022 If pt is not established with a provider, would they like to be referred to a provider to establish care?   Dental Screening: Recommended annual dental exams for proper oral hygiene  Community Resource Referral / Chronic Care Management: CRR required this visit?  No   CCM required this visit?  No      Plan:     I have personally reviewed and noted the following in the patient's chart:   . Medical and social history . Use of alcohol, tobacco or illicit drugs  . Current medications and supplements . Functional ability and status . Nutritional status . Physical activity . Advanced directives . List of other physicians . Hospitalizations, surgeries, and ER visits in previous 12 months . Vitals . Screenings to include cognitive, depression, and falls . Referrals and appointments  In addition, I have reviewed and discussed with patient certain preventive protocols, quality metrics, and best practice recommendations. A written personalized care plan for preventive services as well as general preventive health recommendations were provided to patient.     Willette Brace, LPN   [id number]   Nurse Notes: None

## 2020-03-03 NOTE — Patient Instructions (Addendum)
Raymond Vasquez , Thank you for taking time to come for your Medicare Wellness Visit. I appreciate your ongoing commitment to your health goals. Please review the following plan we discussed and let me know if I can assist you in the future.   Screening recommendations/referrals: Colonoscopy: Done 11/15/17 Recommended yearly ophthalmology/optometry visit for glaucoma screening and checkup Recommended yearly dental visit for hygiene and checkup  Vaccinations: Influenza vaccine: Up to date Pneumococcal vaccine: Up to date Tdap vaccine: Up to date Shingles vaccine: Completed 03/06/19   Covid-19: Completed 3/9/ & 08/30/19  Advanced directives: Advance directive discussed with you today. Even though you declined this today please call our office should you change your mind and we can give you the proper paperwork for you to fill out.  Conditions/risks identified: Exercise   Next appointment: Follow up in one year for your annual wellness visit.   Preventive Care 71 Years and Older, Male Preventive care refers to lifestyle choices and visits with your health care provider that can promote health and wellness. What does preventive care include?  A yearly physical exam. This is also called an annual well check.  Dental exams once or twice a year.  Routine eye exams. Ask your health care provider how often you should have your eyes checked.  Personal lifestyle choices, including:  Daily care of your teeth and gums.  Regular physical activity.  Eating a healthy diet.  Avoiding tobacco and drug use.  Limiting alcohol use.  Practicing safe sex.  Taking low doses of aspirin every day.  Taking vitamin and mineral supplements as recommended by your health care provider. What happens during an annual well check? The services and screenings done by your health care provider during your annual well check will depend on your age, overall health, lifestyle risk factors, and family history of  disease. Counseling  Your health care provider may ask you questions about your:  Alcohol use.  Tobacco use.  Drug use.  Emotional well-being.  Home and relationship well-being.  Sexual activity.  Eating habits.  History of falls.  Memory and ability to understand (cognition).  Work and work Statistician. Screening  You may have the following tests or measurements:  Height, weight, and BMI.  Blood pressure.  Lipid and cholesterol levels. These may be checked every 5 years, or more frequently if you are over 50 years old.  Skin check.  Lung cancer screening. You may have this screening every year starting at age 78 if you have a 30-pack-year history of smoking and currently smoke or have quit within the past 15 years.  Fecal occult blood test (FOBT) of the stool. You may have this test every year starting at age 70.  Flexible sigmoidoscopy or colonoscopy. You may have a sigmoidoscopy every 5 years or a colonoscopy every 10 years starting at age 44.  Prostate cancer screening. Recommendations will vary depending on your family history and other risks.  Hepatitis C blood test.  Hepatitis B blood test.  Sexually transmitted disease (STD) testing.  Diabetes screening. This is done by checking your blood sugar (glucose) after you have not eaten for a while (fasting). You may have this done every 1-3 years.  Abdominal aortic aneurysm (AAA) screening. You may need this if you are a current or former smoker.  Osteoporosis. You may be screened starting at age 16 if you are at high risk. Talk with your health care provider about your test results, treatment options, and if necessary, the need for more  tests. Vaccines  Your health care provider may recommend certain vaccines, such as:  Influenza vaccine. This is recommended every year.  Tetanus, diphtheria, and acellular pertussis (Tdap, Td) vaccine. You may need a Td booster every 10 years.  Zoster vaccine. You may  need this after age 74.  Pneumococcal 13-valent conjugate (PCV13) vaccine. One dose is recommended after age 50.  Pneumococcal polysaccharide (PPSV23) vaccine. One dose is recommended after age 68. Talk to your health care provider about which screenings and vaccines you need and how often you need them. This information is not intended to replace advice given to you by your health care provider. Make sure you discuss any questions you have with your health care provider. Document Released: 06/05/2015 Document Revised: 01/27/2016 Document Reviewed: 03/10/2015 Elsevier Interactive Patient Education  2017 [place] Prevention in the Home Falls can cause injuries. They can happen to people of all ages. There are many things you can do to make your home safe and to help prevent falls. What can I do on the outside of my home?  Regularly fix the edges of walkways and driveways and fix any cracks.  Remove anything that might make you trip as you walk through a door, such as a raised step or threshold.  Trim any bushes or trees on the path to your home.  Use bright outdoor lighting.  Clear any walking paths of anything that might make someone trip, such as rocks or tools.  Regularly check to see if handrails are loose or broken. Make sure that both sides of any steps have handrails.  Any raised decks and porches should have guardrails on the edges.  Have any leaves, snow, or ice cleared regularly.  Use sand or salt on walking paths during winter.  Clean up any spills in your garage right away. This includes oil or grease spills. What can I do in the bathroom?  Use night lights.  Install grab bars by the toilet and in the tub and shower. Do not use towel bars as grab bars.  Use non-skid mats or decals in the tub or shower.  If you need to sit down in the shower, use a plastic, non-slip stool.  Keep the floor dry. Clean up any water that spills on the floor as soon as it  happens.  Remove soap buildup in the tub or shower regularly.  Attach bath mats securely with double-sided non-slip rug tape.  Do not have throw rugs and other things on the floor that can make you trip. What can I do in the bedroom?  Use night lights.  Make sure that you have a light by your bed that is easy to reach.  Do not use any sheets or blankets that are too big for your bed. They should not hang down onto the floor.  Have a firm chair that has side arms. You can use this for support while you get dressed.  Do not have throw rugs and other things on the floor that can make you trip. What can I do in the kitchen?  Clean up any spills right away.  Avoid walking on wet floors.  Keep items that you use a lot in easy-to-reach places.  If you need to reach something above you, use a strong step stool that has a grab bar.  Keep electrical cords out of the way.  Do not use floor polish or wax that makes floors slippery. If you must use wax, use non-skid floor  wax.  Do not have throw rugs and other things on the floor that can make you trip. What can I do with my stairs?  Do not leave any items on the stairs.  Make sure that there are handrails on both sides of the stairs and use them. Fix handrails that are broken or loose. Make sure that handrails are as long as the stairways.  Check any carpeting to make sure that it is firmly attached to the stairs. Fix any carpet that is loose or worn.  Avoid having throw rugs at the top or bottom of the stairs. If you do have throw rugs, attach them to the floor with carpet tape.  Make sure that you have a light switch at the top of the stairs and the bottom of the stairs. If you do not have them, ask someone to add them for you. What else can I do to help prevent falls?  Wear shoes that:  Do not have high heels.  Have rubber bottoms.  Are comfortable and fit you well.  Are closed at the toe. Do not wear sandals.  If you  use a stepladder:  Make sure that it is fully opened. Do not climb a closed stepladder.  Make sure that both sides of the stepladder are locked into place.  Ask someone to hold it for you, if possible.  Clearly mark and make sure that you can see:  Any grab bars or handrails.  First and last steps.  Where the edge of each step is.  Use tools that help you move around (mobility aids) if they are needed. These include:  Canes.  Walkers.  Scooters.  Crutches.  Turn on the lights when you go into a dark area. Replace any light bulbs as soon as they burn out.  Set up your furniture so you have a clear path. Avoid moving your furniture around.  If any of your floors are uneven, fix them.  If there are any pets around you, be aware of where they are.  Review your medicines with your doctor. Some medicines can make you feel dizzy. This can increase your chance of falling. Ask your doctor what other things that you can do to help prevent falls. This information is not intended to replace advice given to you by your health care provider. Make sure you discuss any questions you have with your health care provider. Document Released: 03/05/2009 Document Revised: 10/15/2015 Document Reviewed: 06/13/2014 Elsevier Interactive Patient Education  2017 [employer].

## 2020-04-18 ENCOUNTER — Other Ambulatory Visit: Payer: Self-pay | Admitting: Endocrinology

## 2020-04-21 ENCOUNTER — Other Ambulatory Visit: Payer: Self-pay | Admitting: Endocrinology

## 2020-04-26 ENCOUNTER — Ambulatory Visit
Admission: EM | Admit: 2020-04-26 | Discharge: 2020-04-26 | Disposition: A | Discharge status: Home / Self Care | Payer: Medicare Other | Attending: Emergency Medicine | Admitting: Emergency Medicine

## 2020-04-26 ENCOUNTER — Other Ambulatory Visit: Payer: Self-pay

## 2020-04-26 DIAGNOSIS — N481 Balanitis: Secondary | ICD-10-CM | POA: Diagnosis not present

## 2020-04-26 MED ORDER — FLUCONAZOLE 150 MG PO TABS: 150.0000 mg | ORAL_TABLET | Freq: Once | ORAL | 0 refills | Status: AC

## 2020-04-26 MED ORDER — HYDROCORTISONE 1 % EX CREA: TOPICAL_CREAM | CUTANEOUS | 0 refills | Status: DC

## 2020-04-26 NOTE — Discharge Instructions (Addendum)
Take fluconazole as prescribed Hydrocortisone cream was prescribed use as directed Follow-up with PCP Return or go to ED if you develop any new or worsening of his symptoms

## 2020-04-26 NOTE — ED Triage Notes (Signed)
Pt presents for concern of penile yeast infection. Reports history of same. Pt states the tip of his penis is swollen and it is painful to pull the foreskin back. Reports itching on the outside of his penis.

## 2020-04-26 NOTE — ED Provider Notes (Signed)
[place]   [id number] 04/26/20 Arrival Time: 5277   Chief Complaint  Patient presents with  . Groin Swelling     SUBJECTIVE: History from: patient and family.  Raymond Vasquez is a 69 y.o. male who presented to the urgent care with a complaint of penile itching and irritation for the past 2 days.  Report he is unable to pull his foreskin.  Denies any precipitating event.  Has tried OTC medication without relief.  Reports similar symptoms in the past that resolved fluconazole.  Denies fever, chills, fatigue, sinus pain, rhinorrhea, sore throat, SOB, wheezing, chest pain, nausea, changes in bowel or bladder habits.       ROS: As per HPI.  All other pertinent ROS negative.      Past Medical History:  Diagnosis Date  . Angioedema    he is unsure of this or cause  . 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 Diabetes mellitus type II, controlled, with no complications ([place])   . Hemorrhoids   . 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 Hyperlipidemia   . Hypertension   . Nephrolithiasis    1980s  . OSA (obstructive sleep apnea)   . PMR (polymyalgia rheumatica) ([facility])   . Shingles    2014   Past Surgical History:  Procedure Laterality Date  . APPENDECTOMY    . FINGER SURGERY  06/2012  . LAPAROSCOPIC GASTRIC BANDING  08/10/2009  . ROTATOR CUFF REPAIR Right 06/2019   Dr Noemi Chapel   No Known Allergies Current Facility-Administered Medications on File Prior to Encounter  Medication Dose Route Frequency Provider Last Rate Last Admin  . 0.9 %  sodium chloride infusion  500 mL Intravenous Once Pyrtle, Lajuan Lines, MD      . 0.9 %  sodium chloride infusion  500 mL Intravenous Once Pyrtle, Lajuan Lines, MD       Current Outpatient Medications on File Prior to Encounter  Medication Sig Dispense Refill  . diclofenac (VOLTAREN) 75 MG EC tablet Take 75 mg by mouth 2 (two) times daily.     . folic acid (FOLVITE) 1 MG tablet Take 1 mg by mouth daily.    Marland Kitchen glimepiride  (AMARYL) 4 MG tablet Take 0.5 tablets (2 mg total) by mouth daily with supper. 90 tablet 0  . glucose blood (ONETOUCH VERIO) test strip Use as instructed 50 strip 2  . JARDIANCE 25 MG TABS tablet TAKE 1 TABLET BY MOUTH DAILY 30 tablet 2  . metFORMIN (GLUCOPHAGE) 1000 MG tablet TAKE 1 TABLET BY MOUTH TWICE DAILY WITH MEAL 180 tablet 1  . Methotrexate Sodium (METHOTREXATE, PF,) 50 MG/2ML injection INJECT 1 ML ONCE A WEEK    . ONETOUCH DELICA LANCETS FINE MISC Use as instructed by provider 200 each 3  . rosuvastatin (CRESTOR) 20 MG tablet TAKE 1 TABLET(20 MG) BY MOUTH DAILY 90 tablet 1  . TRULICITY 3 OE/4.2PN SOPN INJECT 3MG  INTO THE SKIN ONCE WEEKLY 2 mL 2   Social History   Socioeconomic History  . Marital status: Married    Spouse name: Not on file  . Number of children: Not on file  . Years of education: Not on file  . Highest education level: Not on file  Occupational History  . Occupation: retired  Tobacco Use  . Smoking status: Current Some Day Smoker    Types: Cigars  . Smokeless tobacco: Current User    Types: Chew  . Tobacco comment: 1 cigar occassionally, not more then 1x per week  Vaping Use  . Vaping Use: Never used  Substance and Sexual Activity  . Alcohol use: No  . Drug use: No  . Sexual activity: Not on file  Other Topics Concern  . 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 Difficulty of Paying Living Expenses: Not hard at all  Food Insecurity: No Food Insecurity  . Worried About Charity fundraiser in the Last Year: Never true  . Ran Out of Food in the Last Year: Never true  Transportation Needs: No Transportation Needs  . Lack of Transportation (Medical): No  . Lack of Transportation (Non-Medical):  No  Physical Activity: Inactive  . Days of Exercise per Week: 0 days  . Minutes of Exercise per Session: 0 min  Stress: No Stress Concern Present  . Feeling of Stress : Not at all  Social Connections: Moderately Isolated  . Frequency of Communication with Friends and Family: Once a week  . Frequency of Social Gatherings with Friends and Family: Twice a week  . Attends Religious Services: Never  . Active Member of Clubs or Organizations: No  . Attends Archivist Meetings: Never  . Marital Status: Married  Human resources officer Violence: Not At Risk  . Fear of Current or Ex-Partner: No  . Emotionally Abused: No  . Physically Abused: No  . Sexually Abused: No   Family History  Problem Relation Age of Onset  . Heart disease Mother   . Diabetes Maternal Grandmother     OBJECTIVE:  Vitals:   04/26/20 1235  BP: 127/83  Pulse: 64  Resp: 16  Temp: 97.7 F (36.5 C)  SpO2: 99%     Physical Exam Vitals and nursing note reviewed.  Constitutional:      General: He is not in acute distress.    Appearance: Normal appearance. He is normal weight. He is not ill-appearing, toxic-appearing or diaphoretic.  Cardiovascular:     Rate and Rhythm: Normal rate and regular rhythm.     Pulses: Normal pulses.     Heart sounds: Normal heart sounds. No murmur heard.  No friction rub. No gallop.   Pulmonary:     Effort: Pulmonary effort is normal. No respiratory distress.     Breath sounds: Normal breath sounds. No stridor. No wheezing, rhonchi or rales.  Chest:     Chest wall: No tenderness.  Genitourinary:    Penis: Normal and uncircumcised.      Comments: Balanitis prersent Neurological:     Mental Status: He is alert and oriented to person, place, and time.     LABS:  No results found for this or any previous visit (from the past 24 hour(s)).   ASSESSMENT & PLAN:  1. Balanitis     Meds ordered this encounter  Medications  . fluconazole (DIFLUCAN) 150 MG tablet    Sig:  Take 1 tablet (150 mg total) by mouth once for 1 dose. May take the 2nd dose 72 hours after the 1st if symptom does not resolve    Dispense:  2 tablet    Refill:  0  . hydrocortisone cream 1 %    Sig: Apply to affected  area 2 times daily    Dispense:  15 g    Refill:  0    Discharge Instructions  Take fluconazole as prescribed Hydrocortisone cream was prescribed use as directed Follow-up with PCP Return or go to ED if you develop any new or worsening of his symptoms  Reviewed expectations re: course of current medical issues. Questions answered. Outlined signs and symptoms indicating need for more acute intervention. Patient verbalized understanding. After Visit Summary given.         Emerson Monte, [place] 04/26/20 1329

## 2020-05-14 ENCOUNTER — Telehealth: Payer: Self-pay | Admitting: Family Medicine

## 2020-05-14 NOTE — Telephone Encounter (Signed)
No longer need it 

## 2020-05-18 ENCOUNTER — Other Ambulatory Visit: Payer: Medicare Other

## 2020-05-21 ENCOUNTER — Ambulatory Visit: Payer: Medicare Other | Admitting: Endocrinology

## 2020-05-25 ENCOUNTER — Encounter: Payer: Self-pay | Admitting: Family Medicine

## 2020-05-25 ENCOUNTER — Ambulatory Visit (INDEPENDENT_AMBULATORY_CARE_PROVIDER_SITE_OTHER): Payer: Medicare Other | Admitting: Family Medicine

## 2020-05-25 ENCOUNTER — Other Ambulatory Visit: Payer: Self-pay

## 2020-05-25 VITALS — BP 124/80 | HR 83 | Temp 97.5°F | Wt 199.5 lb

## 2020-05-25 DIAGNOSIS — B3742 Candidal balanitis: Secondary | ICD-10-CM

## 2020-05-25 DIAGNOSIS — E1169 Type 2 diabetes mellitus with other specified complication: Secondary | ICD-10-CM | POA: Diagnosis not present

## 2020-05-25 DIAGNOSIS — I152 Hypertension secondary to endocrine disorders: Secondary | ICD-10-CM

## 2020-05-25 DIAGNOSIS — E7800 Pure hypercholesterolemia, unspecified: Secondary | ICD-10-CM

## 2020-05-25 DIAGNOSIS — E785 Hyperlipidemia, unspecified: Secondary | ICD-10-CM

## 2020-05-25 DIAGNOSIS — E1159 Type 2 diabetes mellitus with other circulatory complications: Secondary | ICD-10-CM | POA: Diagnosis not present

## 2020-05-25 DIAGNOSIS — E669 Obesity, unspecified: Secondary | ICD-10-CM

## 2020-05-25 LAB — LIPID PANEL
Cholesterol: 106 mg/dL (ref 0–200)
HDL: 36.3 mg/dL — ABNORMAL LOW (ref >39.00)
LDL Cholesterol: 46 mg/dL (ref 0–99)
NonHDL: 69.58
Total CHOL/HDL Ratio: 3
Triglycerides: 118 mg/dL (ref 0.0–149.0)
VLDL: 23.6 mg/dL (ref 0.0–40.0)

## 2020-05-25 LAB — BASIC METABOLIC PANEL
BUN: 15 mg/dL (ref 6–23)
CO2: 31 mEq/L (ref 19–32)
Calcium: 9.7 mg/dL (ref 8.4–10.5)
Chloride: 103 mEq/L (ref 96–112)
Creatinine, Ser: 0.86 mg/dL (ref 0.40–1.50)
GFR: 89.8 mL/min (ref >60.00)
Glucose, Bld: 106 mg/dL — ABNORMAL HIGH (ref 70–99)
Potassium: 4.7 mEq/L (ref 3.5–5.1)
Sodium: 140 mEq/L (ref 135–145)

## 2020-05-25 LAB — HEMOGLOBIN A1C: Hgb A1c MFr Bld: 6.7 % — ABNORMAL HIGH (ref 4.6–6.5)

## 2020-05-25 MED ORDER — FLUCONAZOLE 100 MG PO TABS: 100.0000 mg | ORAL_TABLET | Freq: Every day | ORAL | 0 refills | Status: DC

## 2020-05-25 MED ORDER — NYSTATIN 100000 UNIT/GM EX CREA: 1.0000 "application " | TOPICAL_CREAM | Freq: Two times a day (BID) | CUTANEOUS | 1 refills | Status: DC

## 2020-05-25 NOTE — Addendum Note (Signed)
Addended by: Leonette Nutting on: 05/25/2020 09:22 AM   Modules accepted: Orders

## 2020-05-25 NOTE — Patient Instructions (Signed)
Balanitis  Balanitis is swelling and irritation (inflammation) of the head of the penis (glans penis). The condition may also cause inflammation of the skin around the glans penis (foreskin) in men who have not been circumcised. It may develop because of an infection or another medical condition. Balanitis occurs most often among men who have not had their foreskin removed (uncircumcised men). Balanitis sometimes causes scarring of the penis or foreskin, which can require surgery. Untreated balanitis can increase the risk of penile cancer. What are the causes? Common causes of this condition include:  Poor personal hygiene, especially in uncircumcised men. Not cleaning the glans penis and foreskin well can result in buildup of bacteria, viruses, and yeast, which can lead to infection and inflammation.  Irritation and lack of air flow due to fluid (smegma) that can build up on the glans penis. Other causes include:  Chemical irritation from products such as soaps or shower gels (especially those that have fragrance), condoms, personal lubricants, petroleum jelly, spermicides, or fabric softeners.  Skin conditions, such as eczema, dermatitis, and psoriasis.  Allergies to medicines, such as tetracycline and sulfa drugs.  Certain medical conditions, including liver cirrhosis, congestive heart failure, diabetes, and kidney disease.  Infections, such as candidiasis, HPV (human papillomavirus), herpes simplex, gonorrhea, and syphilis.  Severe obesity. What increases the risk? The following factors may make you more likely to develop this condition:  Having diabetes. This is the most common risk factor.  Having a tight foreskin that is difficult to pull back (retract) past the glans.  Having sexual intercourse without using a condom. What are the signs or symptoms? Symptoms of this condition include:  Discharge from under the foreskin.  A bad smell.  Pain or difficulty retracting the  foreskin.  Tenderness, redness, and swelling of the glans.  A rash or sores on the glans or foreskin.  Itchiness.  Inability to get an erection due to pain.  Difficulty urinating.  Scarring of the penis or foreskin, in some cases. How is this diagnosed? This condition may be diagnosed based on:  A physical exam.  Testing a swab of discharge to check for bacterial or fungal infection.  Blood tests: ? To check for viruses that can cause balanitis. ? To check your blood sugar (glucose) level. High blood glucose could be a sign of diabetes, which can cause balanitis. How is this treated? Treatment for balanitis depends on the cause. Treatment may include:  Improving personal hygiene. Your health care provider may recommend sitting in a bath of warm water that is deep enough to cover your hips and buttocks (sitz bath).  Medicines such as: ? Creams or ointments to reduce swelling (steroids) or to treat an infection. ? Antibiotic medicine. ? Antifungal medicine.  Surgery to remove or cut the foreskin (circumcision). This may be done if you have scarring on the foreskin that makes it difficult to retract.  Controlling other medical problems that may be causing your condition or making it worse. Follow these instructions at home:  Do not have sex until the condition clears up, or until your health care provider approves.  Keep your penis clean and dry. Take sitz baths as recommended by your health care provider.  Avoid products that irritate your skin or make symptoms worse, such as soaps and shower gels that have fragrance.  Take over-the-counter and prescription medicines only as told by your health care provider. ? If you were prescribed an antibiotic medicine or a cream or ointment, use it as   told by your health care provider. Do not stop using your medicine, cream, or ointment even if you start to feel better. ? Do not drive or use heavy machinery while taking prescription  pain medicine. Contact a health care provider if:  Your symptoms get worse or do not improve with home care.  You develop chills or a fever.  You have trouble urinating.  You cannot retract your foreskin. Get help right away if:  You develop severe pain.  You are unable to urinate. Summary  Balanitis is inflammation of the head of the penis (glans penis) caused by irritation or infection.  Balanitis causes pain, redness, and swelling of the glans penis.  This condition is most common among uncircumcised men who do not keep their glans penis clean and in men who have diabetes.  Treatment may include creams or ointments.  Good hygiene is important for prevention. This includes pulling back the foreskin when washing your penis. This information is not intended to replace advice given to you by your health care provider. Make sure you discuss any questions you have with your health care provider. Document Revised: 04/21/2017 Document Reviewed: 03/28/2016 Elsevier Patient Education  2020 Elsevier Inc.  

## 2020-05-25 NOTE — Progress Notes (Signed)
Established Patient Office Visit  Subjective:  Patient ID: Raymond Vasquez, male    DOB: 05-Jun-1950  Age: 70 y.o. MRN: [id number]  CC: No chief complaint on file.   HPI Raymond Vasquez presents for pruritic rash and irritation involving the penis mostly the glans and the distal shaft.  Similar infection back early December and went to ER because treated with fluconazole and this eventually cleared.  He does have type 2 diabetes and is on Jardiance.  He sees endocrinologist later this week.  He plans discussed with them.  He apparently is using over-the-counter 1% hydrocortisone cream on his rash.  Moderate pruritus.   Patient has scheduled follow-up with endocrinologist later this week.  He has due for lipid, hepatic, basic metabolic panel and requesting these labs done today   Past Medical History:  Diagnosis Date  . Angioedema    he is unsure of this or cause  . 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 Diabetes mellitus type II, controlled, with no complications ([place])   . Hemorrhoids   . 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 Hyperlipidemia   . Hypertension   . Nephrolithiasis    1980s  . OSA (obstructive sleep apnea)   . PMR (polymyalgia rheumatica) ([facility])   . Shingles    2014    Past Surgical History:  Procedure Laterality Date  . APPENDECTOMY    . FINGER SURGERY  06/2012  . LAPAROSCOPIC GASTRIC BANDING  08/10/2009  . ROTATOR CUFF REPAIR Right 06/2019   Dr Noemi Chapel    Family History  Problem Relation Age of Onset  . Heart disease Mother   . Diabetes Maternal Grandmother     Social History   Socioeconomic History  . Marital status: Married    Spouse name: Not on file  . Number of children: Not on file  . Years of education: Not on file  . Highest education level: Not on file  Occupational History  . Occupation: retired  Tobacco Use  . Smoking status: Current Some Day Smoker    Types: Cigars  . Smokeless tobacco: Current User    Types:  Chew  . Tobacco comment: 1 cigar occassionally, not more then 1x per week  Vaping Use  . Vaping Use: Never used  Substance and Sexual Activity  . Alcohol use: No  . Drug use: No  . Sexual activity: Not on file  Other Topics Concern  . 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 Difficulty of Paying Living Expenses: Not hard at all  Food Insecurity: No Food Insecurity  . Worried About Charity fundraiser in the Last Year: Never true  . Ran Out of Food in the Last Year: Never true  Transportation Needs: No Transportation Needs  . Lack of Transportation (Medical): No  . Lack of Transportation (Non-Medical): No  Physical Activity: Inactive  . Days of Exercise per Week: 0 days  . Minutes of Exercise per Session: 0 min  Stress: No Stress Concern Present  . Feeling of Stress : Not at all  Social Connections: Moderately Isolated  . Frequency of Communication with Friends and Family: Once a  week  . Frequency of Social Gatherings with Friends and Family: Twice a week  . Attends Religious Services: Never  . Active Member of Clubs or Organizations: No  . Attends Banker Meetings: Never  . Marital Status: Married  Catering manager Violence: Not At Risk  . Fear of Current or Ex-Partner: No  . Emotionally Abused: No  . Physically Abused: No  . Sexually Abused: No    Outpatient Medications Prior to Visit  Medication Sig Dispense Refill  . diclofenac (VOLTAREN) 75 MG EC tablet Take 75 mg by mouth 2 (two) times daily.     . folic acid (FOLVITE) 1 MG tablet Take 1 mg by mouth daily.    Marland Kitchen glimepiride (AMARYL) 4 MG tablet Take 0.5 tablets (2 mg total) by mouth daily with supper. 90 tablet 0  . glucose blood (ONETOUCH  VERIO) test strip Use as instructed 50 strip 2  . hydrocortisone cream 1 % Apply to affected area 2 times daily 15 g 0  . JARDIANCE 25 MG TABS tablet TAKE 1 TABLET BY MOUTH DAILY 30 tablet 2  . metFORMIN (GLUCOPHAGE) 1000 MG tablet TAKE 1 TABLET BY MOUTH TWICE DAILY WITH MEAL 180 tablet 1  . Methotrexate Sodium (METHOTREXATE, PF,) 50 MG/2ML injection INJECT 1 ML ONCE A WEEK    . ONETOUCH DELICA LANCETS FINE MISC Use as instructed by provider 200 each 3  . rosuvastatin (CRESTOR) 20 MG tablet TAKE 1 TABLET(20 MG) BY MOUTH DAILY 90 tablet 1  . TRULICITY 3 MG/0.5ML SOPN INJECT 3MG  INTO THE SKIN ONCE WEEKLY 2 mL 2   Facility-Administered Medications Prior to Visit  Medication Dose Route Frequency Provider Last Rate Last Admin  . 0.9 %  sodium chloride infusion  500 mL Intravenous Once Pyrtle, , MD      . 0.9 %  sodium chloride infusion  500 mL Intravenous Once Pyrtle, Carie Caddy, MD        No Known Allergies  ROS Review of Systems  Constitutional: Negative for chills and fever.  Genitourinary: Negative for dysuria.  Skin: Positive for rash.      Objective:    Physical Exam Vitals reviewed.  Constitutional:      Appearance: Normal appearance.  Cardiovascular:     Rate and Rhythm: Normal rate and regular rhythm.  Skin:    Comments: Patient has erythema involving predominantly the glans but also distal shaft of the penis.  There is little bit of whitish thickened discharge consistent with likely Candida balanitis.  Neurological:     Mental Status: He is alert.     BP 124/80 (BP Location: Left Arm, Patient Position: Sitting, Cuff Size: Large)   Pulse 83   Temp (!) 97.5 F (36.4 C) (Oral)   Wt 199 lb 8 oz (90.5 kg)   SpO2 96%   BMI 28.63 kg/m  Wt Readings from Last 3 Encounters:  05/25/20 199 lb 8 oz (90.5 kg)  01/31/20 198 lb 3.2 oz (89.9 kg)  01/16/20 196 lb (88.9 kg)     Health Maintenance Due  Topic Date Due  . OPHTHALMOLOGY EXAM  06/27/2019  . PNA vac Low Risk  Adult (2 of 2 - PPSV23) 11/20/2019  . COVID-19 Vaccine (3 - Booster for Pfizer series) 02/29/2020    There are no preventive care reminders to display for this patient.  Lab Results  Component Value Date   TSH 1.19 04/05/2013   Lab Results  Component Value Date   WBC 7.8 03/22/2018  HGB 12.7 (L) 03/22/2018   HCT 40.1 03/22/2018   MCV 88 03/22/2018   PLT 326 03/22/2018   Lab Results  Component Value Date   NA 141 01/13/2020   K 4.4 01/13/2020   CO2 30 01/13/2020   GLUCOSE 139 (H) 01/13/2020   BUN 14 01/13/2020   CREATININE 0.78 01/13/2020   BILITOT 0.4 10/07/2019   ALKPHOS 36 (L) 10/07/2019   AST 25 10/07/2019   ALT 30 10/07/2019   PROT 6.4 10/07/2019   ALBUMIN 4.5 10/07/2019   CALCIUM 10.0 01/13/2020   GFR 99.33 01/13/2020   Lab Results  Component Value Date   CHOL 116 10/07/2019   Lab Results  Component Value Date   HDL 32.80 (L) 10/07/2019   Lab Results  Component Value Date   LDLCALC 57 10/07/2019   Lab Results  Component Value Date   TRIG 130.0 10/07/2019   Lab Results  Component Value Date   CHOLHDL 4 10/07/2019   Lab Results  Component Value Date   HGBA1C 6.4 01/13/2020      Assessment & Plan:   Problem List Items Addressed This Visit      Unprioritized   Hyperlipidemia   Hyperlipidemia associated with type 2 diabetes mellitus ([place])   Relevant Orders   Lipid panel   Hepatic function panel   Hypertension associated with diabetes ([place])   Relevant Orders   Basic metabolic panel    Other Visit Diagnoses    Candidal balanitis    -  Primary   Relevant Medications   fluconazole (DIFLUCAN) 100 MG tablet   nystatin cream (MYCOSTATIN)    Evidence for recurrent yeast balanitis on exam  -Discontinue hydrocortisone cream -Discussed Jardiance with his endocrinologist.  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 fluconazole (DIFLUCAN) 100 MG tablet    Sig: Take 1 tablet (100 mg total) by mouth daily.    Dispense:  7 tablet    Refill:  0  . nystatin cream (MYCOSTATIN)    Sig: Apply 1 application topically 2 (two) times daily.    Dispense:  15 g    Refill:  1    Follow-up: No follow-ups on file.    Carolann Littler, MD

## 2020-05-26 ENCOUNTER — Other Ambulatory Visit: Payer: Medicare Other

## 2020-05-27 ENCOUNTER — Other Ambulatory Visit: Payer: Self-pay

## 2020-05-27 ENCOUNTER — Encounter: Payer: Self-pay | Admitting: Endocrinology

## 2020-05-27 ENCOUNTER — Ambulatory Visit (INDEPENDENT_AMBULATORY_CARE_PROVIDER_SITE_OTHER): Payer: Medicare Other | Admitting: Endocrinology

## 2020-05-27 VITALS — BP 128/86 | HR 72 | Ht 68.0 in | Wt 200.4 lb

## 2020-05-27 DIAGNOSIS — E1165 Type 2 diabetes mellitus with hyperglycemia: Secondary | ICD-10-CM

## 2020-05-27 DIAGNOSIS — E7800 Pure hypercholesterolemia, unspecified: Secondary | ICD-10-CM | POA: Diagnosis not present

## 2020-05-27 NOTE — Patient Instructions (Addendum)
Check blood sugars on waking up 2-3 days a week  Also check blood sugars about 2 hours after meals and do this after different meals by rotation  Recommended blood sugar levels on waking up are 90-130 and about 2 hours after meal is 130-160  Reduce juices    INDOOR EXERCISE IDEAS   Use the following examples for a creative indoor workout (perform each move for 2-3 minutes):   Warm up. Put on some music that makes you feel like moving, and dance around the living room.  Watch exercise shows on TV and move along with them. There are tons of free cable channels that have daily exercise shows on them for all levels - beginner through advanced.   You can easily find a number of exercise videos but use one that will suit your liking and exercise level; you can do these on your own schedule.  Walk up and down the steps.  Do dumbbell curls and presses (if you don't have weights, use full water bottles).  Do assisted squats, keeping your back on a fitness ball against the wall or using the back of the couch for support.  Shadow box: Lift and lower the left leg; jab with the right arm, then the left; then lift and lower the right leg.  Fence (you don't even need swords). Pretend you're holding a sword in each hand. Create an X pattern standing still, then moving forward and back.  Hop on your exercise bike or treadmill -- or, for something different, use a weighted hula hoop. If you don't have any of those, just go back to dancing.  Do abdominal crunches (hold a weighted ball for added resistance).  Cool down with [employer] "I Feel Good" -- or whatever tune makes you feel good  [place] website for Covid shot

## 2020-05-27 NOTE — Progress Notes (Signed)
Patient ID: Raymond Vasquez, male   DOB: 02/06/1951, 70 y.o.   MRN: [id number]          Reason for Appointment: Follow-up for Type 2 Diabetes    History of Present Illness:          Date of diagnosis of type 2 diabetes mellitus: 2003 approximately       Recent history:   His A1c has gone up slightly at 6.7, previously was  6.4   Non-insulin hypoglycemic drugs the patient is taking are: Metformin 1 g twice daily, Amaryl 2 mg in a.m., Trulicity 3 mg weekly, Jardiance 25 mg daily  Current management, blood sugar patterns and problems identified:  He has been only checking his blood sugars in the mornings and forgets to check them after meals as before  Not clear why his A1c is higher  Likely he has not had consistent diet  Also not motivated to exercise, usually do not do much activity in the wintertime  Weight is about the same  He thinks his portions are relatively small especially proteins like meats  He thinks he has been drinking more water and not soft drinks but usually will have juices especially in the morning  Lowest blood sugar recorded as 57 but did not mention any episodes of hypoglycemia by symptoms       Side effects from medications have been: None    Typical meal intake: Breakfast is usually skipped, sometimes juice and muffin.  Using smaller portions of meats now            Glucose monitoring:  done 1 times a day         Glucometer: One Touch Verio.       Blood Glucose readings from monitor download:   FASTING blood sugar range 57-231 with average 127 Previous range 91-153, average 118   No readings after meals  Dietician visit, most recent: Before his lap band surgery  Weight history: Maximum 280 in the past  Wt Readings from Last 3 Encounters:  05/27/20 200 lb 6.4 oz (90.9 kg)  05/25/20 199 lb 8 oz (90.5 kg)  01/31/20 198 lb 3.2 oz (89.9 kg)    Glycemic control:   Lab Results  Component Value Date   HGBA1C 6.7 (H) 05/25/2020   HGBA1C  6.4 01/13/2020   HGBA1C 6.6 (H) 10/07/2019   Lab Results  Component Value Date   MICROALBUR <0.7 01/13/2020   LDLCALC 46 05/25/2020   CREATININE 0.86 05/25/2020   Lab Results  Component Value Date   MICRALBCREAT 1.7 01/13/2020    Lab Results  Component Value Date   FRUCTOSAMINE 263 03/07/2019   FRUCTOSAMINE 281 03/22/2018    Office Visit on 05/25/2020  Component Date Value Ref Range Status  . Cholesterol 05/25/2020 106  0 - 200 mg/dL Final   ATP III Classification       Desirable:  < 200 mg/dL               Borderline High:  200 - 239 mg/dL          High:  > = 073 mg/dL  . Triglycerides 05/25/2020 118.0  0.0 - 149.0 mg/dL Final   Normal:  <710 mg/dLBorderline High:  150 - 199 mg/dL  . HDL 05/25/2020 36.30* >39.00 mg/dL Final  . VLDL 62/69/4854 23.6  0.0 - 40.0 mg/dL Final  . LDL Cholesterol 05/25/2020 46  0 - 99 mg/dL Final  . Total CHOL/HDL Ratio 05/25/2020 3  Final                  Men          Women1/2 Average Risk     3.4          3.3Average Risk          5.0          4.42X Average Risk          9.6          7.13X Average Risk          15.0          11.0                      . NonHDL 05/25/2020 69.58   Final   NOTE:  Non-HDL goal should be 30 mg/dL higher than patient's LDL goal (i.e. LDL goal of < 70 mg/dL, would have non-HDL goal of < 100 mg/dL)  . Sodium 05/25/2020 140  135 - 145 mEq/L Final  . Potassium 05/25/2020 4.7  3.5 - 5.1 mEq/L Final  . Chloride 05/25/2020 103  96 - 112 mEq/L Final  . CO2 05/25/2020 31  19 - 32 mEq/L Final  . Glucose, Bld 05/25/2020 106* 70 - 99 mg/dL Final  . BUN 05/25/2020 15  6 - 23 mg/dL Final  . Creatinine, Ser 05/25/2020 0.86  0.40 - 1.50 mg/dL Final  . GFR 05/25/2020 89.80  >60.00 mL/min Final   Calculated using the CKD-EPI Creatinine Equation (2021)  . Calcium 05/25/2020 9.7  8.4 - 10.5 mg/dL Final  . Hgb A1c MFr Bld 05/25/2020 6.7* 4.6 - 6.5 % Final   Glycemic Control Guidelines for People with Diabetes:Non Diabetic:  <6%Goal of  Therapy: <7%Additional Action Suggested:  >8%     Allergies as of 05/27/2020   No Known Allergies     Medication List       Accurate as of May 27, 2020 11:07 AM. If you have any questions, ask your nurse or doctor.        STOP taking these medications   hydrocortisone cream 1 % Stopped by: Elayne Snare, MD     TAKE these medications   diclofenac 75 MG EC tablet Commonly known as: VOLTAREN Take 75 mg by mouth 2 (two) times daily.   fluconazole 100 MG tablet Commonly known as: Diflucan Take 1 tablet (100 mg total) by mouth daily.   folic acid 1 MG tablet Commonly known as: FOLVITE Take 1 mg by mouth daily.   glimepiride 4 MG tablet Commonly known as: AMARYL Take 0.5 tablets (2 mg total) by mouth daily with supper.   Jardiance 25 MG Tabs tablet Generic drug: empagliflozin TAKE 1 TABLET BY MOUTH DAILY   metFORMIN 1000 MG tablet Commonly known as: GLUCOPHAGE TAKE 1 TABLET BY MOUTH TWICE DAILY WITH MEAL   methotrexate (PF) 50 MG/2ML injection INJECT 1 ML ONCE A WEEK   nystatin cream Commonly known as: MYCOSTATIN Apply 1 application topically 2 (two) times daily.   OneTouch Delica Lancets Fine Misc Use as instructed by provider   OneTouch Verio test strip Generic drug: glucose blood Use as instructed   rosuvastatin 20 MG tablet Commonly known as: CRESTOR TAKE 1 TABLET(20 MG) BY MOUTH DAILY   Trulicity 3 0000000 Sopn Generic drug: Dulaglutide INJECT 3MG  INTO THE SKIN ONCE WEEKLY       Allergies: No Known Allergies  Past Medical History:  Diagnosis Date  . Angioedema  he is unsure of this or cause  . 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 Diabetes mellitus type II, controlled, with no complications (HCC)   . Hemorrhoids   . 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 Hyperlipidemia   . Hypertension   . Nephrolithiasis    1980s  . OSA (obstructive sleep apnea)   . PMR (polymyalgia rheumatica) (HCC)   . Shingles    2014     Past Surgical History:  Procedure Laterality Date  . APPENDECTOMY    . FINGER SURGERY  06/2012  . LAPAROSCOPIC GASTRIC BANDING  08/10/2009  . ROTATOR CUFF REPAIR Right 06/2019   Dr Thurston Hole    Family History  Problem Relation Age of Onset  . Heart disease Mother   . Diabetes Maternal Grandmother     Social History:  reports that he has been smoking cigars. His smokeless tobacco use includes chew. He reports that he does not drink alcohol and does not use drugs.   Review of Systems   Lipid history: Followed by PCP, has adequate levels on Crestor 20 mg daily    Lab Results  Component Value Date   CHOL 106 05/25/2020   HDL 36.30 (L) 05/25/2020   LDLCALC 46 05/25/2020   LDLDIRECT 154.1 02/16/2010   TRIG 118.0 05/25/2020   CHOLHDL 3 05/25/2020           Blood pressure history:  Has not been treated for hypertension, also not monitoring at home  BP Readings from Last 3 Encounters:  05/27/20 128/86  05/25/20 124/80  04/26/20 127/83    Most recent eye exam was in 2020, negative for retinopathy   Most recent foot exam: 5/21  Currently known complications of diabetes: none  He is on methotrexate injections for his rheumatoid arthritis  Asking about periodic swelling in his hands but not limited to his joints  LABS:  Office Visit on 05/25/2020  Component Date Value Ref Range Status  . Cholesterol 05/25/2020 106  0 - 200 mg/dL Final   ATP III Classification       Desirable:  < 200 mg/dL               Borderline High:  200 - 239 mg/dL          High:  > = 007 mg/dL  . Triglycerides 05/25/2020 118.0  0.0 - 149.0 mg/dL Final   Normal:  <121 mg/dLBorderline High:  150 - 199 mg/dL  . HDL 05/25/2020 36.30* >39.00 mg/dL Final  . VLDL 97/58/8325 23.6  0.0 - 40.0 mg/dL Final  . LDL Cholesterol 05/25/2020 46  0 - 99 mg/dL Final  . Total CHOL/HDL Ratio 05/25/2020 3   Final                  Men          Women1/2 Average Risk     3.4          3.3Average Risk          5.0           4.42X Average Risk          9.6          7.13X Average Risk          15.0          11.0                      . NonHDL 05/25/2020 69.58   Final  NOTE:  Non-HDL goal should be 30 mg/dL higher than patient's LDL goal (i.e. LDL goal of < 70 mg/dL, would have non-HDL goal of < 100 mg/dL)  . Sodium 05/25/2020 140  135 - 145 mEq/L Final  . Potassium 05/25/2020 4.7  3.5 - 5.1 mEq/L Final  . Chloride 05/25/2020 103  96 - 112 mEq/L Final  . CO2 05/25/2020 31  19 - 32 mEq/L Final  . Glucose, Bld 05/25/2020 106* 70 - 99 mg/dL Final  . BUN 27/25/3664 15  6 - 23 mg/dL Final  . Creatinine, Ser 05/25/2020 0.86  0.40 - 1.50 mg/dL Final  . GFR 40/34/7425 89.80  >60.00 mL/min Final   Calculated using the CKD-EPI Creatinine Equation (2021)  . Calcium 05/25/2020 9.7  8.4 - 10.5 mg/dL Final  . Hgb Z5G MFr Bld 05/25/2020 6.7* 4.6 - 6.5 % Final   Glycemic Control Guidelines for People with Diabetes:Non Diabetic:  <6%Goal of Therapy: <7%Additional Action Suggested:  >8%     Physical Examination:  BP 128/86   Pulse 72   Ht 5\' 8"  (1.727 m)   Wt 200 lb 6.4 oz (90.9 kg)   SpO2 96%   BMI 30.47 kg/m    ASSESSMENT:  Diabetes type 2    See history of present illness for detailed discussion of current diabetes management, blood sugar patterns and problems identified  His A1c is still relatively high at 6.7 although previously better  Continues to be on Jardiance 25 mg,, Amaryl 2 mg, Trulicity 3 mg and Metformin  His blood sugars are fluctuating somewhat but on an average under 130 fasting Again not checking after meals  LIPIDS: Well-controlled  History of balanitis: This is infrequent and discussed that it may be partly related to using Jardiance  PLAN:    Given him ideas about indoor exercises to start Use small portions of juices only To check blood sugars after meals consistently and not just in the morning  To call if he is having any regular hypoglycemia, discussed need to cut back  Amaryl if this happens  Also he will let know if he has recurrent balanitis, may consider reducing Jardiance, however has been on SGLT2 drug since about 2019 without previous episodes He can continue to use nystatin as needed  Encouraged him to check blood pressure periodically at home  Encouraged him to look at options to get his Covid booster if he is not able to get it at [facility] locally May be able to get it at local hospital/[facility] or health department  Follow-up in 4 months    Patient Instructions  Check blood sugars on waking up 2-3 days a week  Also check blood sugars about 2 hours after meals and do this after different meals by rotation  Recommended blood sugar levels on waking up are 90-130 and about 2 hours after meal is 130-160    INDOOR EXERCISE IDEAS   Use the following examples for a creative indoor workout (perform each move for 2-3 minutes):   Warm up. Put on some music that makes you feel like moving, and dance around the living room.  Watch exercise shows on TV and move along with them. There are tons of free cable channels that have daily exercise shows on them for all levels - beginner through advanced.   You can easily find a number of exercise videos but use one that will suit your liking and exercise level; you can do these on your own schedule.  Walk up  and down the steps.  Do dumbbell curls and presses (if you don't have weights, use full water bottles).  Do assisted squats, keeping your back on a fitness ball against the wall or using the back of the couch for support.  Shadow box: Lift and lower the left leg; jab with the right arm, then the left; then lift and lower the right leg.  Fence (you don't even need swords). Pretend you're holding a sword in each hand. Create an X pattern standing still, then moving forward and back.  Hop on your exercise bike or treadmill -- or, for something different, use a weighted hula hoop. If you don't have  any of those, just go back to dancing.  Do abdominal crunches (hold a weighted ball for added resistance).  Cool down with [employer] "I Feel Good" -- or whatever tune makes you feel good         Elayne Snare 05/27/2020, 11:07 AM   Note: This office note was prepared with Dragon voice recognition system technology. Any transcriptional errors that result from this process are unintentional.

## 2020-05-29 ENCOUNTER — Ambulatory Visit: Payer: Medicare Other | Admitting: Endocrinology

## 2020-06-03 ENCOUNTER — Other Ambulatory Visit: Payer: Self-pay

## 2020-06-03 ENCOUNTER — Ambulatory Visit (INDEPENDENT_AMBULATORY_CARE_PROVIDER_SITE_OTHER): Payer: Medicare Other | Admitting: Family Medicine

## 2020-06-03 ENCOUNTER — Encounter: Payer: Self-pay | Admitting: Family Medicine

## 2020-06-03 VITALS — BP 130/88 | HR 77 | Temp 97.9°F | Ht 69.0 in | Wt 197.8 lb

## 2020-06-03 DIAGNOSIS — A6001 Herpesviral infection of penis: Secondary | ICD-10-CM | POA: Diagnosis not present

## 2020-06-03 MED ORDER — VALACYCLOVIR HCL 1 G PO TABS: 1000.0000 mg | ORAL_TABLET | Freq: Three times a day (TID) | ORAL | 0 refills | Status: DC

## 2020-06-03 NOTE — Progress Notes (Signed)
   Subjective:    Patient ID: Raymond Vasquez, male    DOB: January 06, 1951, 70 y.o.   MRN: [id number]  HPI Here for recurrent issues on the penis. On 04-26-20 he went to urgent care for redness and discomfort and swelling on the head of the penis and the foreskin. He was diagnosed with balanitis and was treated with 2 doses of Fluconazole and some topical hydrocortisone. This had little effect, so he saw Dr. Elease Hashimoto on 05-25-20 for this. He was then treated with 7 days of Fluconazole and Nystatin cream. This seemed to help a little more, and some of the swelling went down. However he has continued to have some redness and a burning sensation on the head of the penis, and 2 days ago he noticed a cluster of tiny red blisters on the head.    Review of Systems  Constitutional: Negative.   Respiratory: Negative.   Cardiovascular: Negative.   Genitourinary: Positive for penile swelling.       Objective:   Physical Exam Constitutional:      Appearance: Normal appearance.  Cardiovascular:     Rate and Rhythm: Normal rate and regular rhythm.     Pulses: Normal pulses.     Heart sounds: Normal heart sounds.  Pulmonary:     Effort: Pulmonary effort is normal.     Breath sounds: Normal breath sounds.  Genitourinary:    Comments: The foreskin and the shaft of the penis are mildly swollen, not tender. The glans has some erythema and several shallow ulcerations  Neurological:     Mental Status: He is alert.           Assessment & Plan:  He may have had a balanitis, but now he also has evidence of a herpes simplex infection. I think the swelling is a reaction to that . We will treat with Valtrex TID for 10 days. Recheck as needed.  Alysia Penna, MD

## 2020-06-25 ENCOUNTER — Other Ambulatory Visit: Payer: Self-pay | Admitting: Endocrinology

## 2020-07-06 LAB — HM DIABETES EYE EXAM

## 2020-07-14 ENCOUNTER — Other Ambulatory Visit: Payer: Self-pay | Admitting: Endocrinology

## 2020-07-15 ENCOUNTER — Other Ambulatory Visit: Payer: Self-pay | Admitting: Endocrinology

## 2020-07-29 ENCOUNTER — Ambulatory Visit (INDEPENDENT_AMBULATORY_CARE_PROVIDER_SITE_OTHER): Payer: Medicare Other | Admitting: Family Medicine

## 2020-07-29 ENCOUNTER — Encounter: Payer: Self-pay | Admitting: Family Medicine

## 2020-07-29 ENCOUNTER — Other Ambulatory Visit: Payer: Self-pay

## 2020-07-29 VITALS — BP 106/70 | HR 70 | Temp 97.8°F | Ht 69.0 in | Wt 203.1 lb

## 2020-07-29 DIAGNOSIS — E1159 Type 2 diabetes mellitus with other circulatory complications: Secondary | ICD-10-CM | POA: Diagnosis not present

## 2020-07-29 DIAGNOSIS — E611 Iron deficiency: Secondary | ICD-10-CM

## 2020-07-29 DIAGNOSIS — E785 Hyperlipidemia, unspecified: Secondary | ICD-10-CM

## 2020-07-29 DIAGNOSIS — E538 Deficiency of other specified B group vitamins: Secondary | ICD-10-CM

## 2020-07-29 DIAGNOSIS — M069 Rheumatoid arthritis, unspecified: Secondary | ICD-10-CM | POA: Diagnosis not present

## 2020-07-29 DIAGNOSIS — E1169 Type 2 diabetes mellitus with other specified complication: Secondary | ICD-10-CM

## 2020-07-29 DIAGNOSIS — E7800 Pure hypercholesterolemia, unspecified: Secondary | ICD-10-CM | POA: Diagnosis not present

## 2020-07-29 DIAGNOSIS — E1140 Type 2 diabetes mellitus with diabetic neuropathy, unspecified: Secondary | ICD-10-CM

## 2020-07-29 DIAGNOSIS — I152 Hypertension secondary to endocrine disorders: Secondary | ICD-10-CM

## 2020-07-29 NOTE — Progress Notes (Signed)
Raymond Vasquez DOB: 1950-10-06 Encounter date: 07/29/2020  This is a 70 y.o. male who presents with Chief Complaint  Patient presents with  . Follow-up    History of present illness: Diabetes: Follows with endocrinology (Dr. Dwyane Dee).  metformin, Amaryl, Trulicity, Jardiance, glimepiride half tab at dinner. Eating better at home; healthier. Less craving for sweets. Sugar was 100 fasting this morning. Has had some days where he goes below 90; but these are rare.   Lisinopril stopped by Dr. Dwyane Dee per patient.  Hyperlipidemia: Crestor 20 mg- he plans to change this to evening.   Rheumatoid arthritis: Follows with rheumatology (Dr. Dennie Fetters).  Seeing them regularly. States that he has no pain essentially on the methotrexate injection and diclofenac BID. His shoulder is doing great.   Noting sometimes that fingers will turn a dark blue on the tips of fingers, not painful. Skin stays intact.   Also toe left feet - middle toes. Sometimes get numb. Not happening every day. No color changes of toes. Not painful, just numb. Usually same two toes.   He is due for repeat colonoscopy in 10/2020 No Known Allergies Current Meds  Medication Sig  . diclofenac (VOLTAREN) 75 MG EC tablet Take 75 mg by mouth 2 (two) times daily.   . fluconazole (DIFLUCAN) 100 MG tablet Take 1 tablet (100 mg total) by mouth daily.  . folic acid (FOLVITE) 1 MG tablet Take 1 mg by mouth daily.  Marland Kitchen glimepiride (AMARYL) 4 MG tablet TAKE 1/2 TABLET BY MOUTH WITH DINNER  . glucose blood (ONETOUCH VERIO) test strip Use as instructed  . JARDIANCE 25 MG TABS tablet TAKE 1 TABLET BY MOUTH DAILY  . metFORMIN (GLUCOPHAGE) 1000 MG tablet TAKE 1 TABLET BY MOUTH TWICE DAILY WITH MEAL  . Methotrexate Sodium (METHOTREXATE, PF,) 50 MG/2ML injection INJECT 1 ML ONCE A WEEK  . ONETOUCH DELICA LANCETS FINE MISC Use as instructed by provider  . rosuvastatin (CRESTOR) 20 MG tablet TAKE 1 TABLET(20 MG) BY MOUTH DAILY  . TRULICITY 3  UV/2.5DG SOPN INJECT 3MG  INTO THE SKIN ONCE WEEKLY   Current Facility-Administered Medications for the 07/29/20 encounter (Office Visit) with Caren Macadam, MD  Medication  . 0.9 %  sodium chloride infusion  . 0.9 %  sodium chloride infusion    Review of Systems  Constitutional: Negative for chills, fatigue and fever.  Respiratory: Negative for cough, chest tightness, shortness of breath and wheezing.   Cardiovascular: Negative for chest pain, palpitations and leg swelling.    Objective:  There were no vitals taken for this visit.      BP Readings from Last 3 Encounters:  06/03/20 130/88  05/27/20 128/86  05/25/20 124/80   Wt Readings from Last 3 Encounters:  06/03/20 197 lb 12.8 oz (89.7 kg)  05/27/20 200 lb 6.4 oz (90.9 kg)  05/25/20 199 lb 8 oz (90.5 kg)    Physical Exam Constitutional:      General: He is not in acute distress.    Appearance: He is well-developed and well-nourished.  HENT:     Head: Normocephalic and atraumatic.  Cardiovascular:     Rate and Rhythm: Normal rate and regular rhythm.  Extrasystoles are present.    Pulses: Intact distal pulses.     Heart sounds: Normal heart sounds. No murmur heard.   Pulmonary:     Effort: Pulmonary effort is normal.     Breath sounds: Normal breath sounds.  Abdominal:     General: Bowel sounds are normal. There is  no distension.     Palpations: Abdomen is soft.     Tenderness: There is no abdominal tenderness. There is no guarding.  Musculoskeletal:     Right lower leg: No edema.     Left lower leg: No edema.  Feet:     Comments: Normal monofilament exam; norm cap refill.  Skin:    General: Skin is warm and dry.     Comments: Sensory exam of the foot is normal, tested with the monofilament. Good pulses, no lesions or ulcers, good peripheral pulses.  Psychiatric:        Mood and Affect: Mood and affect normal.        Judgment: Judgment normal.     Assessment/Plan  1. Hypertension associated with  diabetes ([place]) Well controlled with diet/lifestyle. - CBC with Differential/Platelet; Future - Comprehensive metabolic panel; Future  2. Hyperlipidemia associated with type 2 diabetes mellitus ([place]) Continue with crestor 20mg  daily. - TSH; Future  3. Rheumatoid arthritis involving multiple sites, unspecified whether rheumatoid factor present ([place]) Well controlled. Continue with methotrexate, following with rheum.  5. Type 2 diabetes mellitus with diabetic neuropathy, without long-term current use of insulin ([place]) Has been well controlled; following with endo. Continue glimepiride 4mg , jardiance 25mg , metformin 1000mg  bid trulicity.  6. B12 deficiency - Vitamin B12; Future  7. Iron deficiency - Ferritin; Future    Micheline Rough, MD

## 2020-07-29 NOTE — Patient Instructions (Signed)
Call GI for follow up colonoscopy: [phone]

## 2020-08-04 ENCOUNTER — Other Ambulatory Visit: Payer: Self-pay | Admitting: Endocrinology

## 2020-08-04 DIAGNOSIS — E611 Iron deficiency: Secondary | ICD-10-CM

## 2020-08-04 DIAGNOSIS — E1169 Type 2 diabetes mellitus with other specified complication: Secondary | ICD-10-CM

## 2020-08-04 DIAGNOSIS — E669 Obesity, unspecified: Secondary | ICD-10-CM

## 2020-08-04 DIAGNOSIS — M069 Rheumatoid arthritis, unspecified: Secondary | ICD-10-CM

## 2020-08-30 ENCOUNTER — Other Ambulatory Visit: Payer: Self-pay | Admitting: Family Medicine

## 2020-09-22 ENCOUNTER — Other Ambulatory Visit (INDEPENDENT_AMBULATORY_CARE_PROVIDER_SITE_OTHER): Payer: Medicare Other

## 2020-09-22 ENCOUNTER — Other Ambulatory Visit: Payer: Self-pay

## 2020-09-22 DIAGNOSIS — M069 Rheumatoid arthritis, unspecified: Secondary | ICD-10-CM | POA: Diagnosis not present

## 2020-09-22 DIAGNOSIS — E1165 Type 2 diabetes mellitus with hyperglycemia: Secondary | ICD-10-CM

## 2020-09-22 DIAGNOSIS — E611 Iron deficiency: Secondary | ICD-10-CM | POA: Diagnosis not present

## 2020-09-22 DIAGNOSIS — E669 Obesity, unspecified: Secondary | ICD-10-CM | POA: Diagnosis not present

## 2020-09-22 DIAGNOSIS — E1169 Type 2 diabetes mellitus with other specified complication: Secondary | ICD-10-CM

## 2020-09-22 LAB — COMPREHENSIVE METABOLIC PANEL
ALT: 34 U/L (ref 0–53)
AST: 26 U/L (ref 0–37)
Albumin: 4.7 g/dL (ref 3.5–5.2)
Alkaline Phosphatase: 39 U/L (ref 39–117)
BUN: 16 mg/dL (ref 6–23)
CO2: 28 mEq/L (ref 19–32)
Calcium: 9.7 mg/dL (ref 8.4–10.5)
Chloride: 105 mEq/L (ref 96–112)
Creatinine, Ser: 0.97 mg/dL (ref 0.40–1.50)
GFR: 80.78 mL/min (ref >60.00)
Glucose, Bld: 136 mg/dL — ABNORMAL HIGH (ref 70–99)
Potassium: 4 mEq/L (ref 3.5–5.1)
Sodium: 143 mEq/L (ref 135–145)
Total Bilirubin: 0.6 mg/dL (ref 0.2–1.2)
Total Protein: 6.9 g/dL (ref 6.0–8.3)

## 2020-09-22 LAB — CBC WITH DIFFERENTIAL/PLATELET
Basophils Absolute: 0 10*3/uL (ref 0.0–0.1)
Basophils Relative: 1 % (ref 0.0–3.0)
Eosinophils Absolute: 0.2 10*3/uL (ref 0.0–0.7)
Eosinophils Relative: 4.7 % (ref 0.0–5.0)
HCT: 44.2 % (ref 39.0–52.0)
Hemoglobin: 14.4 g/dL (ref 13.0–17.0)
Lymphocytes Relative: 20.8 % (ref 12.0–46.0)
Lymphs Abs: 0.9 10*3/uL (ref 0.7–4.0)
MCHC: 32.5 g/dL (ref 30.0–36.0)
MCV: 89.9 fl (ref 78.0–100.0)
Monocytes Absolute: 0.4 10*3/uL (ref 0.1–1.0)
Monocytes Relative: 9.6 % (ref 3.0–12.0)
Neutro Abs: 2.6 10*3/uL (ref 1.4–7.7)
Neutrophils Relative %: 63.9 % (ref 43.0–77.0)
Platelets: 271 10*3/uL (ref 150.0–400.0)
RBC: 4.92 Mil/uL (ref 4.22–5.81)
RDW: 15.8 % — ABNORMAL HIGH (ref 11.5–15.5)
WBC: 4.1 10*3/uL (ref 4.0–10.5)

## 2020-09-22 LAB — VITAMIN B12: Vitamin B-12: 162 pg/mL — ABNORMAL LOW (ref 211–911)

## 2020-09-22 LAB — HEMOGLOBIN A1C: Hgb A1c MFr Bld: 6.7 % — ABNORMAL HIGH (ref 4.6–6.5)

## 2020-09-22 LAB — TSH: TSH: 2.49 u[IU]/mL (ref 0.35–4.50)

## 2020-09-24 ENCOUNTER — Other Ambulatory Visit: Payer: Medicare Other

## 2020-09-24 ENCOUNTER — Other Ambulatory Visit: Payer: Self-pay

## 2020-09-24 ENCOUNTER — Encounter: Payer: Self-pay | Admitting: Endocrinology

## 2020-09-24 ENCOUNTER — Ambulatory Visit (INDEPENDENT_AMBULATORY_CARE_PROVIDER_SITE_OTHER): Payer: Medicare Other | Admitting: Endocrinology

## 2020-09-24 VITALS — BP 120/80 | HR 66 | Ht 69.0 in | Wt 203.0 lb

## 2020-09-24 DIAGNOSIS — E1165 Type 2 diabetes mellitus with hyperglycemia: Secondary | ICD-10-CM

## 2020-09-24 DIAGNOSIS — E538 Deficiency of other specified B group vitamins: Secondary | ICD-10-CM | POA: Diagnosis not present

## 2020-09-24 LAB — FERRITIN: Ferritin: 125.2 ng/mL (ref 22.0–322.0)

## 2020-09-24 NOTE — Progress Notes (Signed)
Patient ID: Raymond Vasquez, male   DOB: Jan 16, 1951, 70 y.o.   MRN: [id number]          Reason for Appointment: Follow-up for Type 2 Diabetes    History of Present Illness:          Date of diagnosis of type 2 diabetes mellitus: 2003 approximately       Recent history:   His A1c has stayed the same at 6.7  Non-insulin hypoglycemic drugs the patient is taking are: Metformin 1 g twice daily, Amaryl 2 mg in p.m., Trulicity 3 mg weekly, Jardiance 25 mg daily  Current management, blood sugar patterns and problems identified:  He has been only checking his blood sugars before breakfast again and consistently is not able to remember to check it after meals  Although his A1c is fairly good it has been as low as 6.4  Blood sugars are fairly consistent, previously glucose was fluctuating more on her last visit  Lab fasting glucose 136, appears to have slightly better readings when he first wakes up  Is trying to be generally more active but not doing much formal exercise  Still not losing weight       Side effects from medications have been: None    Typical meal intake: Breakfast is usually skipped, sometimes juice and muffin.  Using smaller portions of meats now            Glucose monitoring:  done 1 times a day         Glucometer: One Touch Verio.       Blood Glucose readings from monitor download:  Recent fasting range 88-148, AVERAGE 116  PREVIOUS FASTING blood sugar range 57-231 with average 127   No readings after meals  Dietician visit, most recent: Before his lap band surgery  Weight history: Maximum 280 in the past  Wt Readings from Last 3 Encounters:  09/24/20 203 lb (92.1 kg)  07/29/20 203 lb 1.6 oz (92.1 kg)  06/03/20 197 lb 12.8 oz (89.7 kg)    Glycemic control:   Lab Results  Component Value Date   HGBA1C 6.7 (H) 09/22/2020   HGBA1C 6.7 (H) 05/25/2020   HGBA1C 6.4 01/13/2020   Lab Results  Component Value Date   MICROALBUR <0.7 01/13/2020   LDLCALC  46 05/25/2020   CREATININE 0.97 09/22/2020   Lab Results  Component Value Date   MICRALBCREAT 1.7 01/13/2020    Lab Results  Component Value Date   FRUCTOSAMINE 263 03/07/2019   FRUCTOSAMINE 281 03/22/2018    Lab on 09/22/2020  Component Date Value Ref Range Status  . Hgb A1c MFr Bld 09/22/2020 6.7* 4.6 - 6.5 % Final   Glycemic Control Guidelines for People with Diabetes:Non Diabetic:  <6%Goal of Therapy: <7%Additional Action Suggested:  >8%   . Vitamin B-12 09/22/2020 162* 211 - 911 pg/mL Final  . WBC 09/22/2020 4.1  4.0 - 10.5 K/uL Final  . RBC 09/22/2020 4.92  4.22 - 5.81 Mil/uL Final  . Hemoglobin 09/22/2020 14.4  13.0 - 17.0 g/dL Final  . HCT 09/22/2020 44.2  39.0 - 52.0 % Final  . MCV 09/22/2020 89.9  78.0 - 100.0 fl Final  . MCHC 09/22/2020 32.5  30.0 - 36.0 g/dL Final  . RDW 09/22/2020 15.8* 11.5 - 15.5 % Final  . Platelets 09/22/2020 271.0  150.0 - 400.0 K/uL Final  . Neutrophils Relative % 09/22/2020 63.9  43.0 - 77.0 % Final  . Lymphocytes Relative 09/22/2020 20.8  12.0 -  46.0 % Final  . Monocytes Relative 09/22/2020 9.6  3.0 - 12.0 % Final  . Eosinophils Relative 09/22/2020 4.7  0.0 - 5.0 % Final  . Basophils Relative 09/22/2020 1.0  0.0 - 3.0 % Final  . Neutro Abs 09/22/2020 2.6  1.4 - 7.7 K/uL Final  . Lymphs Abs 09/22/2020 0.9  0.7 - 4.0 K/uL Final  . Monocytes Absolute 09/22/2020 0.4  0.1 - 1.0 K/uL Final  . Eosinophils Absolute 09/22/2020 0.2  0.0 - 0.7 K/uL Final  . Basophils Absolute 09/22/2020 0.0  0.0 - 0.1 K/uL Final  . Sodium 09/22/2020 143  135 - 145 mEq/L Final  . Potassium 09/22/2020 4.0  3.5 - 5.1 mEq/L Final  . Chloride 09/22/2020 105  96 - 112 mEq/L Final  . CO2 09/22/2020 28  19 - 32 mEq/L Final  . Glucose, Bld 09/22/2020 136* 70 - 99 mg/dL Final  . BUN 09/22/2020 16  6 - 23 mg/dL Final  . Creatinine, Ser 09/22/2020 0.97  0.40 - 1.50 mg/dL Final  . Total Bilirubin 09/22/2020 0.6  0.2 - 1.2 mg/dL Final  . Alkaline Phosphatase 09/22/2020 39  39  - 117 U/L Final  . AST 09/22/2020 26  0 - 37 U/L Final  . ALT 09/22/2020 34  0 - 53 U/L Final  . Total Protein 09/22/2020 6.9  6.0 - 8.3 g/dL Final  . Albumin 09/22/2020 4.7  3.5 - 5.2 g/dL Final  . GFR 09/22/2020 80.78  >60.00 mL/min Final   Calculated using the CKD-EPI Creatinine Equation (2021)  . Calcium 09/22/2020 9.7  8.4 - 10.5 mg/dL Final  . TSH 09/22/2020 2.49  0.35 - 4.50 uIU/mL Final    Allergies as of 09/24/2020   No Known Allergies     Medication List       Accurate as of Sep 24, 2020  9:03 AM. If you have any questions, ask your nurse or doctor.        diclofenac 75 MG EC tablet Commonly known as: VOLTAREN Take 75 mg by mouth 2 (two) times daily.   fluconazole 100 MG tablet Commonly known as: Diflucan Take 1 tablet (100 mg total) by mouth daily.   folic acid 1 MG tablet Commonly known as: FOLVITE Take 1 mg by mouth daily.   glimepiride 4 MG tablet Commonly known as: AMARYL TAKE 1/2 TABLET BY MOUTH WITH DINNER   Jardiance 25 MG Tabs tablet Generic drug: empagliflozin TAKE 1 TABLET BY MOUTH DAILY   metFORMIN 1000 MG tablet Commonly known as: GLUCOPHAGE TAKE 1 TABLET BY MOUTH TWICE DAILY WITH MEAL   methotrexate (PF) 50 MG/2ML injection INJECT 1 ML ONCE A WEEK   OneTouch Delica Lancets Fine Misc Use as instructed by provider   OneTouch Verio test strip Generic drug: glucose blood Use as instructed   rosuvastatin 20 MG tablet Commonly known as: CRESTOR TAKE 1 TABLET(20 MG) BY MOUTH DAILY   Trulicity 3 KD/9.8PJ Sopn Generic drug: Dulaglutide INJECT 3MG  INTO THE SKIN ONCE WEEKLY       Allergies: No Known Allergies  Past Medical History:  Diagnosis Date  . Angioedema    he is unsure of this or cause  . 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 Diabetes mellitus type II, controlled, with no complications ([place])   . Hemorrhoids   . 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 Hyperlipidemia   . Hypertension   .  Nephrolithiasis    1980s  . OSA (obstructive sleep apnea)   . PMR (  polymyalgia rheumatica) ([facility])   . Shingles    2014    Past Surgical History:  Procedure Laterality Date  . APPENDECTOMY    . FINGER SURGERY  06/2012  . LAPAROSCOPIC GASTRIC BANDING  08/10/2009  . ROTATOR CUFF REPAIR Right 06/2019   Dr Noemi Chapel    Family History  Problem Relation Age of Onset  . Heart disease Mother   . Diabetes Maternal Grandmother     Social History:  reports that he has been smoking cigars. His smokeless tobacco use includes chew. He reports that he does not drink alcohol and does not use drugs.   Review of Systems   Lipid history: Followed by PCP, has adequate levels on Crestor 20 mg daily    Lab Results  Component Value Date   CHOL 106 05/25/2020   HDL 36.30 (L) 05/25/2020   LDLCALC 46 05/25/2020   LDLDIRECT 154.1 02/16/2010   TRIG 118.0 05/25/2020   CHOLHDL 3 05/25/2020           Blood pressure history:  Has not been treated for hypertension, also not monitoring at home  BP Readings from Last 3 Encounters:  09/24/20 120/80  07/29/20 106/70  06/03/20 130/88    Most recent eye exam was in 2020, negative for retinopathy   Most recent foot exam: 5/21  Currently known complications of diabetes: none  He is on methotrexate injections for his rheumatoid arthritis  .  B12 level is low, this was done by PCP for screening   LABS:  Lab on 09/22/2020  Component Date Value Ref Range Status  . Hgb A1c MFr Bld 09/22/2020 6.7* 4.6 - 6.5 % Final   Glycemic Control Guidelines for People with Diabetes:Non Diabetic:  <6%Goal of Therapy: <7%Additional Action Suggested:  >8%   . Vitamin B-12 09/22/2020 162* 211 - 911 pg/mL Final  . WBC 09/22/2020 4.1  4.0 - 10.5 K/uL Final  . RBC 09/22/2020 4.92  4.22 - 5.81 Mil/uL Final  . Hemoglobin 09/22/2020 14.4  13.0 - 17.0 g/dL Final  . HCT 09/22/2020 44.2  39.0 - 52.0 % Final  . MCV 09/22/2020 89.9  78.0 - 100.0 fl Final  . MCHC  09/22/2020 32.5  30.0 - 36.0 g/dL Final  . RDW 09/22/2020 15.8* 11.5 - 15.5 % Final  . Platelets 09/22/2020 271.0  150.0 - 400.0 K/uL Final  . Neutrophils Relative % 09/22/2020 63.9  43.0 - 77.0 % Final  . Lymphocytes Relative 09/22/2020 20.8  12.0 - 46.0 % Final  . Monocytes Relative 09/22/2020 9.6  3.0 - 12.0 % Final  . Eosinophils Relative 09/22/2020 4.7  0.0 - 5.0 % Final  . Basophils Relative 09/22/2020 1.0  0.0 - 3.0 % Final  . Neutro Abs 09/22/2020 2.6  1.4 - 7.7 K/uL Final  . Lymphs Abs 09/22/2020 0.9  0.7 - 4.0 K/uL Final  . Monocytes Absolute 09/22/2020 0.4  0.1 - 1.0 K/uL Final  . Eosinophils Absolute 09/22/2020 0.2  0.0 - 0.7 K/uL Final  . Basophils Absolute 09/22/2020 0.0  0.0 - 0.1 K/uL Final  . Sodium 09/22/2020 143  135 - 145 mEq/L Final  . Potassium 09/22/2020 4.0  3.5 - 5.1 mEq/L Final  . Chloride 09/22/2020 105  96 - 112 mEq/L Final  . CO2 09/22/2020 28  19 - 32 mEq/L Final  . Glucose, Bld 09/22/2020 136* 70 - 99 mg/dL Final  . BUN 09/22/2020 16  6 - 23 mg/dL Final  . Creatinine, Ser 09/22/2020 0.97  0.40 - 1.50  mg/dL Final  . Total Bilirubin 09/22/2020 0.6  0.2 - 1.2 mg/dL Final  . Alkaline Phosphatase 09/22/2020 39  39 - 117 U/L Final  . AST 09/22/2020 26  0 - 37 U/L Final  . ALT 09/22/2020 34  0 - 53 U/L Final  . Total Protein 09/22/2020 6.9  6.0 - 8.3 g/dL Final  . Albumin 09/22/2020 4.7  3.5 - 5.2 g/dL Final  . GFR 09/22/2020 80.78  >60.00 mL/min Final   Calculated using the CKD-EPI Creatinine Equation (2021)  . Calcium 09/22/2020 9.7  8.4 - 10.5 mg/dL Final  . TSH 09/22/2020 2.49  0.35 - 4.50 uIU/mL Final    Physical Examination:  BP 120/80 (BP Location: Left Arm, Patient Position: Sitting, Cuff Size: Normal)   Pulse 66   Ht 5\' 9"  (1.753 m)   Wt 203 lb (92.1 kg)   SpO2 99%   BMI 29.98 kg/m    ASSESSMENT:  Diabetes type 2 non-insulin-dependent  See history of present illness for detailed discussion of current diabetes management, blood sugar  patterns and problems identified  His A1c is 6.7  Treated with Jardiance 25 mg,, Amaryl 2 mg, Trulicity 3 mg and Metformin  Fasting blood sugars are fairly good and stable although not checking very much and no readings after meals He may occasionally have high postprandial readings which he does not monitor Currently his BMI is about 30 but can do better with exercise for weight loss  Renal function stable with continuing Jardiance and has not had any recent recurrence of balanitis  B12 deficiency: May be related to metformin use  Other labs including TSH normal  PLAN:    Again tried to check readings after meals No change in diabetes medication  B12 1000 mcg daily but he can also discuss with PCP   Follow-up in 4 months    There are no Patient Instructions on file for this visit.       Elayne Snare 09/24/2020, 9:03 AM   Note: This office note was prepared with Dragon voice recognition system technology. Any transcriptional errors that result from this process are unintentional.

## 2020-09-24 NOTE — Patient Instructions (Addendum)
Get OTC B12 1000ug daily  Check blood sugars on waking up 3-4 days a week  Also check blood sugars about 2 hours after meals and do this after different meals by rotation  Recommended blood sugar levels on waking up are 90-130 and about 2 hours after meal is 130-160  Please bring your blood sugar monitor to each visit, thank you

## 2020-09-26 ENCOUNTER — Other Ambulatory Visit: Payer: Self-pay | Admitting: Family Medicine

## 2020-10-06 ENCOUNTER — Other Ambulatory Visit: Payer: Self-pay | Admitting: Endocrinology

## 2020-10-12 ENCOUNTER — Other Ambulatory Visit: Payer: Self-pay | Admitting: Endocrinology

## 2020-10-29 ENCOUNTER — Other Ambulatory Visit: Payer: Self-pay | Admitting: Endocrinology

## 2021-01-04 ENCOUNTER — Other Ambulatory Visit: Payer: Self-pay | Admitting: Endocrinology

## 2021-01-11 ENCOUNTER — Other Ambulatory Visit: Payer: Self-pay | Admitting: Endocrinology

## 2021-01-21 ENCOUNTER — Other Ambulatory Visit: Payer: Self-pay | Admitting: Endocrinology

## 2021-01-21 ENCOUNTER — Other Ambulatory Visit: Payer: Medicare Other

## 2021-01-26 ENCOUNTER — Other Ambulatory Visit: Payer: Medicare Other

## 2021-01-28 ENCOUNTER — Ambulatory Visit: Payer: Medicare Other | Admitting: Endocrinology

## 2021-01-29 ENCOUNTER — Encounter: Payer: Medicare Other | Admitting: Family Medicine

## 2021-02-12 ENCOUNTER — Encounter: Payer: Self-pay | Admitting: Internal Medicine

## 2021-02-22 ENCOUNTER — Other Ambulatory Visit: Payer: Self-pay | Admitting: Family Medicine

## 2021-03-10 ENCOUNTER — Encounter: Payer: Self-pay | Admitting: Family Medicine

## 2021-03-10 ENCOUNTER — Ambulatory Visit: Payer: Medicare Other

## 2021-03-10 ENCOUNTER — Ambulatory Visit (INDEPENDENT_AMBULATORY_CARE_PROVIDER_SITE_OTHER): Payer: Medicare Other | Admitting: Family Medicine

## 2021-03-10 ENCOUNTER — Other Ambulatory Visit: Payer: Self-pay

## 2021-03-10 VITALS — BP 132/76 | HR 74 | Temp 98.4°F | Wt 200.3 lb

## 2021-03-10 DIAGNOSIS — E538 Deficiency of other specified B group vitamins: Secondary | ICD-10-CM | POA: Diagnosis not present

## 2021-03-10 DIAGNOSIS — H539 Unspecified visual disturbance: Secondary | ICD-10-CM

## 2021-03-10 DIAGNOSIS — I152 Hypertension secondary to endocrine disorders: Secondary | ICD-10-CM

## 2021-03-10 DIAGNOSIS — E1140 Type 2 diabetes mellitus with diabetic neuropathy, unspecified: Secondary | ICD-10-CM

## 2021-03-10 DIAGNOSIS — R1310 Dysphagia, unspecified: Secondary | ICD-10-CM

## 2021-03-10 DIAGNOSIS — E785 Hyperlipidemia, unspecified: Secondary | ICD-10-CM

## 2021-03-10 DIAGNOSIS — E1169 Type 2 diabetes mellitus with other specified complication: Secondary | ICD-10-CM

## 2021-03-10 DIAGNOSIS — R569 Unspecified convulsions: Secondary | ICD-10-CM

## 2021-03-10 DIAGNOSIS — E1159 Type 2 diabetes mellitus with other circulatory complications: Secondary | ICD-10-CM

## 2021-03-10 DIAGNOSIS — M6530 Trigger finger, unspecified finger: Secondary | ICD-10-CM | POA: Diagnosis not present

## 2021-03-10 DIAGNOSIS — Z1211 Encounter for screening for malignant neoplasm of colon: Secondary | ICD-10-CM

## 2021-03-10 DIAGNOSIS — E7800 Pure hypercholesterolemia, unspecified: Secondary | ICD-10-CM

## 2021-03-10 DIAGNOSIS — M069 Rheumatoid arthritis, unspecified: Secondary | ICD-10-CM

## 2021-03-10 DIAGNOSIS — Z23 Encounter for immunization: Secondary | ICD-10-CM

## 2021-03-10 LAB — CBC WITH DIFFERENTIAL/PLATELET
Basophils Absolute: 0.1 10*3/uL (ref 0.0–0.1)
Basophils Relative: 1 % (ref 0.0–3.0)
Eosinophils Absolute: 0.2 10*3/uL (ref 0.0–0.7)
Eosinophils Relative: 3 % (ref 0.0–5.0)
HCT: 48.3 % (ref 39.0–52.0)
Hemoglobin: 15.2 g/dL (ref 13.0–17.0)
Lymphocytes Relative: 15.8 % (ref 12.0–46.0)
Lymphs Abs: 0.9 10*3/uL (ref 0.7–4.0)
MCHC: 31.4 g/dL (ref 30.0–36.0)
MCV: 89.4 fl (ref 78.0–100.0)
Monocytes Absolute: 0.5 10*3/uL (ref 0.1–1.0)
Monocytes Relative: 8.6 % (ref 3.0–12.0)
Neutro Abs: 4 10*3/uL (ref 1.4–7.7)
Neutrophils Relative %: 71.6 % (ref 43.0–77.0)
Platelets: 251 10*3/uL (ref 150.0–400.0)
RBC: 5.4 Mil/uL (ref 4.22–5.81)
RDW: 15.8 % — ABNORMAL HIGH (ref 11.5–15.5)
WBC: 5.6 10*3/uL (ref 4.0–10.5)

## 2021-03-10 LAB — LIPID PANEL
Cholesterol: 109 mg/dL (ref 0–200)
HDL: 38.9 mg/dL — ABNORMAL LOW (ref >39.00)
LDL Cholesterol: 45 mg/dL (ref 0–99)
NonHDL: 69.71
Total CHOL/HDL Ratio: 3
Triglycerides: 124 mg/dL (ref 0.0–149.0)
VLDL: 24.8 mg/dL (ref 0.0–40.0)

## 2021-03-10 LAB — FOLATE: Folate: 16.1 ng/mL (ref >5.9)

## 2021-03-10 LAB — COMPREHENSIVE METABOLIC PANEL
ALT: 71 U/L — ABNORMAL HIGH (ref 0–53)
AST: 36 U/L (ref 0–37)
Albumin: 4.7 g/dL (ref 3.5–5.2)
Alkaline Phosphatase: 40 U/L (ref 39–117)
BUN: 14 mg/dL (ref 6–23)
CO2: 30 mEq/L (ref 19–32)
Calcium: 9.6 mg/dL (ref 8.4–10.5)
Chloride: 104 mEq/L (ref 96–112)
Creatinine, Ser: 0.81 mg/dL (ref 0.40–1.50)
GFR: 90.93 mL/min (ref >60.00)
Glucose, Bld: 121 mg/dL — ABNORMAL HIGH (ref 70–99)
Potassium: 4.5 mEq/L (ref 3.5–5.1)
Sodium: 142 mEq/L (ref 135–145)
Total Bilirubin: 0.6 mg/dL (ref 0.2–1.2)
Total Protein: 6.5 g/dL (ref 6.0–8.3)

## 2021-03-10 LAB — TSH: TSH: 1.38 u[IU]/mL (ref 0.35–5.50)

## 2021-03-10 LAB — VITAMIN B12: Vitamin B-12: 804 pg/mL (ref 211–911)

## 2021-03-10 LAB — T3, FREE: T3, Free: 2.8 pg/mL (ref 2.3–4.2)

## 2021-03-10 LAB — MAGNESIUM: Magnesium: 2 mg/dL (ref 1.5–2.5)

## 2021-03-10 LAB — HEMOGLOBIN A1C: Hgb A1c MFr Bld: 7.4 % — ABNORMAL HIGH (ref 4.6–6.5)

## 2021-03-10 LAB — T4, FREE: Free T4: 0.76 ng/dL (ref 0.60–1.60)

## 2021-03-10 NOTE — Progress Notes (Signed)
Raymond Vasquez DOB: February 03, 1951 Encounter date: 03/10/2021  This is a 70 y.o. male who presents with Chief Complaint  Patient presents with   Neurologic Problem     History of present illness:  Went to wife's doc appointment (Friday 10/14) then while eating waffle fries - started to get blurry spots in eyes - trying to focus and spots there in vision, but then go away. Got blurry spots, then next thing he knew he tensed up, lasted less than a minute. Wife then was able to get his attention. She was going to take him to hospital but he didn't want to go. She said he didn't respond right away when she called his name, but then he was able to still focus and he states that he was aware the whole time. Wife thought maybe sugar had dropped so low and then food that he had just taken in helped in time. Afterwards felt tired, body aches like when sick with cold. Eyes ached. By the time EMS came to house about an hour later he was ok. No loss of urine. No fall. Sugar by time he was home was 214. Did check sugar that morning; doesn't know what it was.   Saturday night around 8:30 started to get the blurry spots in vision again. Looks like blurry balls rotating around in vision. Went away after 20-30 minutes. He was able to focus normal after this. Maybe more like sinus headache on and off, but not associated with visual change. 114 was lowest he has seen.   Sugar this morning 120's, 114 earlier this week. Does check at 6-7am regularly. Doesn't check in afternoon.   Has rheumatology and endocrinology appointments coming next month. Getting bad trigger fingers now in middle finger - getting stuck and having to pop fingers back out. Hasn't had finger injections in past. Hasn't been as bad as it has been in this last month. Some aching during day.   In last couple of months wife was hospitalized and then developed staff infection, so he was under increased stress. Wife is also having surgery tomorrow as  well. Has just been a very stressful time.    No chest pain, chest pressure, heart racing.   Energy level has been steady. Doesn't do as much as he used to do in years past, but now usually tries to do everything in the morning and then rests more in afternoon.   Does have history of migraine; did get blurry spots in vision with that, but just one eye.   Hx of lap band - if meat is too dry and he eats too fast it will get stuck and he has to vomit something back up. Feels he should made appointment to check/follow up on this. Does fine with everything except dry meat, dry bread.   Last eye exam was this year. No major issues; just little adjustments with strength lenses.   No Known Allergies Current Meds  Medication Sig   diclofenac (VOLTAREN) 75 MG EC tablet Take 75 mg by mouth 2 (two) times daily.    folic acid (FOLVITE) 1 MG tablet Take 1 mg by mouth daily.   glimepiride (AMARYL) 4 MG tablet TAKE 1/2 TABLET BY MOUTH WITH DINNER   JARDIANCE 25 MG TABS tablet TAKE 1 TABLET BY MOUTH DAILY   metFORMIN (GLUCOPHAGE) 1000 MG tablet TAKE 1 TABLET BY MOUTH TWICE DAILY WITH MEAL   Methotrexate Sodium (METHOTREXATE, PF,) 50 MG/2ML injection INJECT 1 ML ONCE A WEEK  ONETOUCH DELICA LANCETS FINE MISC Use as instructed by provider   ONETOUCH VERIO test strip USE AS INSTRUCTED   rosuvastatin (CRESTOR) 20 MG tablet TAKE 1 TABLET(20 MG) BY MOUTH DAILY   TRULICITY 3 OH/6.0VP SOPN INJECT CONTENTS OF 1 PEN INTO THE SKIN ONCE WEEKLY   Current Facility-Administered Medications for the 03/10/21 encounter (Office Visit) with Caren Macadam, MD  Medication   0.9 %  sodium chloride infusion   0.9 %  sodium chloride infusion    Review of Systems  Constitutional:  Negative for activity change, appetite change, chills, fatigue, fever and unexpected weight change.  HENT:  Negative for congestion, ear pain, hearing loss, sinus pressure, sinus pain, sore throat and trouble swallowing.   Eyes:  Negative  for pain and visual disturbance.  Respiratory:  Negative for cough, chest tightness, shortness of breath and wheezing.   Cardiovascular:  Negative for chest pain, palpitations and leg swelling.  Gastrointestinal:  Negative for abdominal distention, abdominal pain, blood in stool, constipation, diarrhea, nausea and vomiting.  Genitourinary:  Negative for decreased urine volume, difficulty urinating, dysuria, penile pain and testicular pain.  Musculoskeletal:  Negative for arthralgias, back pain and joint swelling.  Skin:  Negative for rash.  Neurological:  Negative for dizziness, weakness, numbness and headaches.  Hematological:  Negative for adenopathy. Does not bruise/bleed easily.  Psychiatric/Behavioral:  Negative for agitation, sleep disturbance and suicidal ideas. The patient is not nervous/anxious.    Objective:  BP 132/76 (BP Location: Left Arm, Patient Position: Sitting, Cuff Size: Normal)   Pulse 74   Temp 98.4 F (36.9 C) (Oral)   Wt 200 lb 4.8 oz (90.9 kg)   SpO2 96%   BMI 29.58 kg/m   Weight: 200 lb 4.8 oz (90.9 kg)   BP Readings from Last 3 Encounters:  03/10/21 132/76  09/24/20 120/80  07/29/20 106/70   Wt Readings from Last 3 Encounters:  03/10/21 200 lb 4.8 oz (90.9 kg)  09/24/20 203 lb (92.1 kg)  07/29/20 203 lb 1.6 oz (92.1 kg)    Physical Exam Constitutional:      General: He is not in acute distress.    Appearance: He is well-developed. He is not diaphoretic.  HENT:     Head: Normocephalic and atraumatic.     Right Ear: External ear normal.     Left Ear: External ear normal.     Nose: Nose normal.     Mouth/Throat:     Pharynx: No oropharyngeal exudate.  Eyes:     Conjunctiva/sclera: Conjunctivae normal.     Pupils: Pupils are equal, round, and reactive to light.  Neck:     Thyroid: No thyromegaly.  Cardiovascular:     Rate and Rhythm: Normal rate and regular rhythm.     Heart sounds: Normal heart sounds. No murmur heard.   No friction rub. No  gallop.  Pulmonary:     Effort: Pulmonary effort is normal. No respiratory distress.     Breath sounds: Normal breath sounds. No stridor. No wheezing or rales.  Abdominal:     General: Bowel sounds are normal.     Palpations: Abdomen is soft.  Musculoskeletal:        General: Normal range of motion.     Cervical back: Neck supple.     Comments: Mild tenderness with palpation flexor tendons bilat palms  Lymphadenopathy:     Cervical: No cervical adenopathy.  Skin:    General: Skin is warm and dry.  Neurological:  Mental Status: He is alert and oriented to person, place, and time.     Cranial Nerves: No cranial nerve deficit.     Motor: No weakness, tremor, abnormal muscle tone or pronator drift.     Coordination: Romberg sign negative. Finger-Nose-Finger Test and Heel to [facility] Test normal.     Gait: Gait normal.     Deep Tendon Reflexes: Reflexes normal.     Reflex Scores:      Tricep reflexes are 2+ on the right side and 2+ on the left side.      Bicep reflexes are 2+ on the right side and 2+ on the left side.      Brachioradialis reflexes are 2+ on the right side and 2+ on the left side.      Patellar reflexes are 2+ on the right side and 2+ on the left side. Psychiatric:        Behavior: Behavior normal.        Thought Content: Thought content normal.        Judgment: Judgment normal.    Assessment/Plan  1. Trigger finger, unspecified finger, unspecified laterality - Ambulatory referral to Hand Surgery  2. Observed seizure-like activity ([place]) Wife observed; patient feels he was aware throughout episode, but wife states hard to get his focus/attention. Bloodwork, referral to neurology, but discussed obtaining MRI if bloodwork all normal as we await neuro referral. This is completely new for him; and there was not identifiable cause (although as we discussed could be related to blood sugar, but this was not confirmed). This warrants further evaluation.  - TSH; Future - T4,  free; Future - T3, free; Future - Magnesium; Future - Ambulatory referral to Neurology - Magnesium - T3, free - T4, free - TSH  3. Hypertension associated with diabetes ([place]) Bp normal today, not on medications. Historically has been low normal.  - CBC with Differential/Platelet; Future - Comprehensive metabolic panel; Future - Comprehensive metabolic panel - CBC with Differential/Platelet  4. Hyperlipidemia associated with type 2 diabetes mellitus ([place]) Continue with crestor 20mg  daily.  - Lipid panel; Future - Lipid panel  5. Rheumatoid arthritis involving multiple sites, unspecified whether rheumatoid factor present [facility]) Follows with rheumatology. Sx stable.   7. B12 deficiency - Vitamin B12; Future - Folate; Future - Folate - Vitamin B12  8. Type 2 diabetes mellitus with diabetic neuropathy, without long-term current use of insulin ([place]) Follows with endo. Advised ok to decrease glimepiride in half until we get bloodwork back and make sure he is not having hypoglycemic episodes. Monitor sugars at home.  - Hemoglobin A1c; Future - Hemoglobin A1c  9. Visual disturbance See above.  - Ambulatory referral to Neurology  10. Dysphagia, unspecified type Mentions some food sticking; due for colonoscopy; will refer so they can consider both upper/lower endoscopy.  - Ambulatory referral to Gastroenterology  11. Screening for colon cancer - Ambulatory referral to Gastroenterology  12. Need for immunization against influenza - Flu Vaccine QUAD High Dose(Fluad)  13. Need for prophylactic vaccination against Streptococcus pneumoniae (pneumococcus) - Pneumococcal conjugate vaccine 20-valent (Prevnar 20)  Return for pending bloodwork. 53 minutes spent in chart review, time with patient, exam, follow up plan, charting.    Micheline Rough, MD

## 2021-03-12 ENCOUNTER — Other Ambulatory Visit: Payer: Self-pay | Admitting: Family Medicine

## 2021-03-12 DIAGNOSIS — R569 Unspecified convulsions: Secondary | ICD-10-CM

## 2021-03-16 ENCOUNTER — Encounter: Payer: Self-pay | Admitting: Neurology

## 2021-03-18 ENCOUNTER — Telehealth: Payer: Self-pay

## 2021-03-18 NOTE — Telephone Encounter (Signed)
Patient called asking if a Rx can be sent to the pharmacy for the yeast infection he get regularly pt was informed PCP is out if the office today

## 2021-03-18 NOTE — Telephone Encounter (Signed)
Please advise 

## 2021-03-19 ENCOUNTER — Other Ambulatory Visit: Payer: Self-pay | Admitting: Family Medicine

## 2021-03-19 ENCOUNTER — Ambulatory Visit (INDEPENDENT_AMBULATORY_CARE_PROVIDER_SITE_OTHER): Payer: Medicare Other

## 2021-03-19 DIAGNOSIS — Z0000 Encounter for general adult medical examination without abnormal findings: Secondary | ICD-10-CM | POA: Diagnosis not present

## 2021-03-19 MED ORDER — FLUCONAZOLE 100 MG PO TABS: 100.0000 mg | ORAL_TABLET | Freq: Every day | ORAL | 0 refills | Status: DC

## 2021-03-19 NOTE — Telephone Encounter (Signed)
Diflucan was sent in

## 2021-03-19 NOTE — Patient Instructions (Signed)
Raymond Vasquez , Thank you for taking time to come for your Medicare Wellness Visit. I appreciate your ongoing commitment to your health goals. Please review the following plan we discussed and let me know if I can assist you in the future.   Screening recommendations/referrals: Colonoscopy: Patient to call and schedule  Recommended yearly ophthalmology/optometry visit for glaucoma screening and checkup Recommended yearly dental visit for hygiene and checkup  Vaccinations: Influenza vaccine: completed  Pneumococcal vaccine: completed  Tdap vaccine: 07/14/2011 Shingles vaccine: completed series     Advanced directives: none   Conditions/risks identified: none   Next appointment: none   Preventive Care 68 Years and Older, Male Preventive care refers to lifestyle choices and visits with your health care provider that can promote health and wellness. What does preventive care include? A yearly physical exam. This is also called an annual well check. Dental exams once or twice a year. Routine eye exams. Ask your health care provider how often you should have your eyes checked. Personal lifestyle choices, including: Daily care of your teeth and gums. Regular physical activity. Eating a healthy diet. Avoiding tobacco and drug use. Limiting alcohol use. Practicing safe sex. Taking low doses of aspirin every day. Taking vitamin and mineral supplements as recommended by your health care provider. What happens during an annual well check? The services and screenings done by your health care provider during your annual well check will depend on your age, overall health, lifestyle risk factors, and family history of disease. Counseling  Your health care provider may ask you questions about your: Alcohol use. Tobacco use. Drug use. Emotional well-being. Home and relationship well-being. Sexual activity. Eating habits. History of falls. Memory and ability to understand (cognition). Work  and work Statistician. Screening  You may have the following tests or measurements: Height, weight, and BMI. Blood pressure. Lipid and cholesterol levels. These may be checked every 5 years, or more frequently if you are over 17 years old. Skin check. Lung cancer screening. You may have this screening every year starting at age 2 if you have a 30-pack-year history of smoking and currently smoke or have quit within the past 15 years. Fecal occult blood test (FOBT) of the stool. You may have this test every year starting at age 57. Flexible sigmoidoscopy or colonoscopy. You may have a sigmoidoscopy every 5 years or a colonoscopy every 10 years starting at age 36. Prostate cancer screening. Recommendations will vary depending on your family history and other risks. Hepatitis C blood test. Hepatitis B blood test. Sexually transmitted disease (STD) testing. Diabetes screening. This is done by checking your blood sugar (glucose) after you have not eaten for a while (fasting). You may have this done every 1-3 years. Abdominal aortic aneurysm (AAA) screening. You may need this if you are a current or former smoker. Osteoporosis. You may be screened starting at age 49 if you are at high risk. Talk with your health care provider about your test results, treatment options, and if necessary, the need for more tests. Vaccines  Your health care provider may recommend certain vaccines, such as: Influenza vaccine. This is recommended every year. Tetanus, diphtheria, and acellular pertussis (Tdap, Td) vaccine. You may need a Td booster every 10 years. Zoster vaccine. You may need this after age 19. Pneumococcal 13-valent conjugate (PCV13) vaccine. One dose is recommended after age 2. Pneumococcal polysaccharide (PPSV23) vaccine. One dose is recommended after age 69. Talk to your health care provider about which screenings and vaccines you  need and how often you need them. This information is not intended  to replace advice given to you by your health care provider. Make sure you discuss any questions you have with your health care provider. Document Released: 06/05/2015 Document Revised: 01/27/2016 Document Reviewed: 03/10/2015 Elsevier Interactive Patient Education  2017 [place] Prevention in the Home Falls can cause injuries. They can happen to people of all ages. There are many things you can do to make your home safe and to help prevent falls. What can I do on the outside of my home? Regularly fix the edges of walkways and driveways and fix any cracks. Remove anything that might make you trip as you walk through a door, such as a raised step or threshold. Trim any bushes or trees on the path to your home. Use bright outdoor lighting. Clear any walking paths of anything that might make someone trip, such as rocks or tools. Regularly check to see if handrails are loose or broken. Make sure that both sides of any steps have handrails. Any raised decks and porches should have guardrails on the edges. Have any leaves, snow, or ice cleared regularly. Use sand or salt on walking paths during winter. Clean up any spills in your garage right away. This includes oil or grease spills. What can I do in the bathroom? Use night lights. Install grab bars by the toilet and in the tub and shower. Do not use towel bars as grab bars. Use non-skid mats or decals in the tub or shower. If you need to sit down in the shower, use a plastic, non-slip stool. Keep the floor dry. Clean up any water that spills on the floor as soon as it happens. Remove soap buildup in the tub or shower regularly. Attach bath mats securely with double-sided non-slip rug tape. Do not have throw rugs and other things on the floor that can make you trip. What can I do in the bedroom? Use night lights. Make sure that you have a light by your bed that is easy to reach. Do not use any sheets or blankets that are too big  for your bed. They should not hang down onto the floor. Have a firm chair that has side arms. You can use this for support while you get dressed. Do not have throw rugs and other things on the floor that can make you trip. What can I do in the kitchen? Clean up any spills right away. Avoid walking on wet floors. Keep items that you use a lot in easy-to-reach places. If you need to reach something above you, use a strong step stool that has a grab bar. Keep electrical cords out of the way. Do not use floor polish or wax that makes floors slippery. If you must use wax, use non-skid floor wax. Do not have throw rugs and other things on the floor that can make you trip. What can I do with my stairs? Do not leave any items on the stairs. Make sure that there are handrails on both sides of the stairs and use them. Fix handrails that are broken or loose. Make sure that handrails are as long as the stairways. Check any carpeting to make sure that it is firmly attached to the stairs. Fix any carpet that is loose or worn. Avoid having throw rugs at the top or bottom of the stairs. If you do have throw rugs, attach them to the floor with carpet tape. Make sure that  you have a light switch at the top of the stairs and the bottom of the stairs. If you do not have them, ask someone to add them for you. What else can I do to help prevent falls? Wear shoes that: Do not have high heels. Have rubber bottoms. Are comfortable and fit you well. Are closed at the toe. Do not wear sandals. If you use a stepladder: Make sure that it is fully opened. Do not climb a closed stepladder. Make sure that both sides of the stepladder are locked into place. Ask someone to hold it for you, if possible. Clearly mark and make sure that you can see: Any grab bars or handrails. First and last steps. Where the edge of each step is. Use tools that help you move around (mobility aids) if they are needed. These  include: Canes. Walkers. Scooters. Crutches. Turn on the lights when you go into a dark area. Replace any light bulbs as soon as they burn out. Set up your furniture so you have a clear path. Avoid moving your furniture around. If any of your floors are uneven, fix them. If there are any pets around you, be aware of where they are. Review your medicines with your doctor. Some medicines can make you feel dizzy. This can increase your chance of falling. Ask your doctor what other things that you can do to help prevent falls. This information is not intended to replace advice given to you by your health care provider. Make sure you discuss any questions you have with your health care provider. Document Released: 03/05/2009 Document Revised: 10/15/2015 Document Reviewed: 06/13/2014 Elsevier Interactive Patient Education  2017 [employer].

## 2021-03-19 NOTE — Telephone Encounter (Signed)
Pt informed that rx has been sent.

## 2021-03-19 NOTE — Progress Notes (Signed)
Subjective:   Raymond Vasquez is a 70 y.o. male who presents for an Initial Medicare Annual Wellness Visit.  I connected with Amani Nodarse today by telephone and verified that I am speaking with the correct person using two identifiers. Location patient: home Location provider: work Persons participating in the virtual visit: patient, provider.   I discussed the limitations, risks, security and privacy concerns of performing an evaluation and management service by telephone and the availability of in person appointments. I also discussed with the patient that there may be a patient responsible charge related to this service. The patient expressed understanding and verbally consented to this telephonic visit.    Interactive audio and video telecommunications were attempted between this provider and patient, however failed, due to patient having technical difficulties OR patient did not have access to video capability.  We continued and completed visit with audio only.    Review of Systems     Cardiac Risk Factors include: advanced age (>42men, >42 women);diabetes mellitus;dyslipidemia;male gender     Objective:    Today's Vitals   There is no height or weight on file to calculate BMI.  Advanced Directives 03/19/2021 03/03/2020 03/30/2015  Does Patient Have a Medical Advance Directive? No No No  Would patient like information on creating a medical advance directive? No - Patient declined (No Data) No - patient declined information    Current Medications (verified) Outpatient Encounter Medications as of 03/19/2021  Medication Sig   diclofenac (VOLTAREN) 75 MG EC tablet Take 75 mg by mouth 2 (two) times daily.    fluconazole (DIFLUCAN) 100 MG tablet Take 1 tablet (100 mg total) by mouth daily.   folic acid (FOLVITE) 1 MG tablet Take 1 mg by mouth daily.   glimepiride (AMARYL) 4 MG tablet TAKE 1/2 TABLET BY MOUTH WITH DINNER   JARDIANCE 25 MG TABS tablet TAKE 1 TABLET BY MOUTH DAILY    metFORMIN (GLUCOPHAGE) 1000 MG tablet TAKE 1 TABLET BY MOUTH TWICE DAILY WITH MEAL   Methotrexate Sodium (METHOTREXATE, PF,) 50 MG/2ML injection INJECT 1 ML ONCE A WEEK   ONETOUCH DELICA LANCETS FINE MISC Use as instructed by provider   ONETOUCH VERIO test strip USE AS INSTRUCTED   rosuvastatin (CRESTOR) 20 MG tablet TAKE 1 TABLET(20 MG) BY MOUTH DAILY   TRULICITY 3 HW/2.9HB SOPN INJECT CONTENTS OF 1 PEN INTO THE SKIN ONCE WEEKLY   Facility-Administered Encounter Medications as of 03/19/2021  Medication   0.9 %  sodium chloride infusion   0.9 %  sodium chloride infusion    Allergies (verified) Patient has no known allergies.   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 07/05/2013   Hyperlipidemia    Hypertension    Nephrolithiasis    1980s   OSA (obstructive sleep apnea)    PMR (polymyalgia rheumatica) ([place])    Shingles    2014   Past Surgical History:  Procedure Laterality Date   APPENDECTOMY     FINGER SURGERY  06/2012   LAPAROSCOPIC GASTRIC BANDING  08/10/2009   ROTATOR CUFF REPAIR Right 06/2019   Dr Noemi Chapel   Family History  Problem Relation Age of Onset   Heart disease Mother    Diabetes Maternal Grandmother    Social History  Socioeconomic History   Marital status: Married    Spouse name: Not on file   Number of children: Not on file   Years of education: Not on file   Highest education level: Not on file  Occupational History   Occupation: retired  Tobacco Use   Smoking status: Some Days    Types: Cigars   Smokeless tobacco: Current    Types: Chew   Tobacco comments:    1 cigar occassionally, not more then 1x per week  Vaping Use   Vaping Use: Never used  Substance and Sexual Activity   Alcohol use: No   Drug use: No   Sexual  activity: Not on file  Other Topics Concern   Not on file  Social History Narrative   Work or School: retired Armed forces technical officer with [employer] Situation: lives with wifes      Spiritual Beliefs: none      Lifestyle: no regular exercise, healthy diet      Social Determinants of Health   Financial Resource Strain: Low Risk    Difficulty of Paying Living Expenses: Not hard at all  Food Insecurity: No Food Insecurity   Worried About Charity fundraiser in the Last Year: Never true   Arboriculturist in the Last Year: Never true  Transportation Needs: No Transportation Needs   Lack of Transportation (Medical): No   Lack of Transportation (Non-Medical): No  Physical Activity: Inactive   Days of Exercise per Week: 0 days   Minutes of Exercise per Session: 0 min  Stress: No Stress Concern Present   Feeling of Stress : Not at all  Social Connections: Moderately Isolated   Frequency of Communication with Friends and Family: Twice a week   Frequency of Social Gatherings with Friends and Family: Twice a week   Attends Religious Services: Never   Printmaker: No   Attends Music therapist: Never   Marital Status: Married    Tobacco Counseling Ready to quit: Not Answered Counseling given: Not Answered Tobacco comments: 1 cigar occassionally, not more then 1x per week   Clinical Intake:  Pre-visit preparation completed: Yes  Pain : No/denies pain     Nutritional Risks: None Diabetes: Yes CBG done?: No Did pt. bring in CBG monitor from home?: No  How often do you need to have someone help you when you read instructions, pamphlets, or other written materials from your doctor or pharmacy?: 1 - Never What is the last grade level you completed in school?: High School  Diabetic?yes Nutrition Risk Assessment:  Has the patient had any N/V/D within the last 2 months?  No  Does the patient have any non-healing wounds?  No   Has the patient had any unintentional weight loss or weight gain?  No   Diabetes:  Is the patient diabetic?  Yes  If diabetic, was a CBG obtained today?  No  Did the patient bring in their glucometer from home?  No  How often do you monitor your CBG's? 2 x day .   Financial Strains and Diabetes Management:  Are you having any financial strains with the device, your supplies or your medication? No .  Does the patient want to be seen by Chronic Care Management for management of their diabetes?  No  Would the patient like to be referred to a Nutritionist or for Diabetic Management?  No   Diabetic Exams:  Diabetic Eye Exam: Completed 06/2020 Diabetic Foot Exam: Overdue, Pt has been advised about the importance in completing this exam. Pt is scheduled for diabetic foot exam on next office visit .   Interpreter Needed?: No  Information entered by :: [place] of Daily Living In your present state of health, do you have any difficulty performing the following activities: 03/19/2021 03/10/2021  Hearing? N N  Vision? N N  Difficulty concentrating or making decisions? N N  Walking or climbing stairs? N N  Dressing or bathing? N N  Doing errands, shopping? N N  Preparing Food and eating ? N -  Using the Toilet? N -  In the past six months, have you accidently leaked urine? N -  Do you have problems with loss of bowel control? N -  Managing your Medications? N -  Managing your Finances? N -  Housekeeping or managing your Housekeeping? N -  Some recent data might be hidden    Patient Care Team: Caren Macadam, MD as PCP - General (Family Medicine) Elayne Snare, MD as Consulting Physician (Endocrinology) Rosita Kea, PA-C (Inactive) (Family Medicine)  Indicate any recent Medical Services you may have received from other than [facility] providers in the past year (date may be approximate).     Assessment:   This is a routine wellness examination for  [place].  Hearing/Vision screen Vision Screening - Comments:: Annual eye exams wears glasses   Dietary issues and exercise activities discussed: Current Exercise Habits: The patient does not participate in regular exercise at present   Goals Addressed             This Visit's Progress    Patient Stated   On track    Exercise       Depression Screen PHQ 2/9 Scores 03/19/2021 03/19/2021 03/10/2021 03/03/2020 02/28/2019 07/24/2018 07/24/2018  PHQ - 2 Score 0 0 1 0 0 0 0  PHQ- 9 Score - - - - - 1 -    Fall Risk Fall Risk  03/19/2021 03/10/2021 03/03/2020 02/28/2019 02/28/2019  Falls in the past year? 0 0 0 1 0  Number falls in past yr: 0 0 0 0 0  Injury with Fall? 0 0 0 1 0  Risk for fall due to : - No Fall Risks Impaired vision;Impaired balance/gait - -  Risk for fall due to: Comment - - vertigo at times if bending over - -  Follow up Falls evaluation completed Falls evaluation completed Falls prevention discussed - -    FALL RISK PREVENTION PERTAINING TO THE HOME:  Any stairs in or around the home? Yes  If so, are there any without handrails? No  Home free of loose throw rugs in walkways, pet beds, electrical cords, etc? Yes  Adequate lighting in your home to reduce risk of falls? Yes   ASSISTIVE DEVICES UTILIZED TO PREVENT FALLS:  Life alert? No  Use of a cane, walker or w/c? No  Grab bars in the bathroom? No  Shower chair or bench in shower? No  Elevated toilet seat or a handicapped toilet? No    Cognitive Function:  Normal cognitive status assessed by direct observation by this Nurse Health Advisor. No abnormalities found.     6CIT Screen 03/03/2020  What Year? 0 points  What month? 0 points  Count back from 20 0 points  Months in reverse 0 points  Repeat phrase 0 points    Immunizations Immunization History  Administered Date(s) Administered  Fluad Quad(high Dose 65+) 04/03/2019, 01/31/2020, 03/10/2021   Influenza Whole 02/20/2009, 02/21/2011   Influenza,  High Dose Seasonal PF 06/05/2018   Influenza-Unspecified 01/22/2016, 02/20/2017   PFIZER(Purple Top)SARS-COV-2 Vaccination 07/30/2019, 08/30/2019   PNEUMOCOCCAL CONJUGATE-20 03/10/2021   Pneumococcal Conjugate-13 08/14/2013, 03/21/2014   Pneumococcal Polysaccharide-23 11/20/2014   Tdap 07/14/2011   Zoster Recombinat (Shingrix) 03/06/2019   Zoster, Live 08/14/2013    TDAP status: Up to date  Flu Vaccine status: Up to date  Pneumococcal vaccine status: Up to date  Covid-19 vaccine status: Completed vaccines  Qualifies for Shingles Vaccine? Yes   Zostavax completed Yes   Shingrix Completed?: Yes  Screening Tests Health Maintenance  Topic Date Due   Zoster Vaccines- Shingrix (2 of 2) 05/01/2019   COVID-19 Vaccine (3 - Booster for Pfizer series) 10/25/2019   FOOT EXAM  10/14/2020   COLONOSCOPY (Pts 45-71yrs Insurance coverage will need to be confirmed)  11/15/2020   OPHTHALMOLOGY EXAM  07/06/2021   TETANUS/TDAP  07/13/2021   HEMOGLOBIN A1C  09/08/2021   Pneumonia Vaccine 70+ Years old  Completed   INFLUENZA VACCINE  Completed   Hepatitis C Screening  Completed   HPV VACCINES  Aged Out    Health Maintenance  Health Maintenance Due  Topic Date Due   Zoster Vaccines- Shingrix (2 of 2) 05/01/2019   COVID-19 Vaccine (3 - Booster for [place] series) 10/25/2019   FOOT EXAM  10/14/2020   COLONOSCOPY (Pts 45-36yrs Insurance coverage will need to be confirmed)  11/15/2020    Colorectal cancer screening: Type of screening: Colonoscopy. Completed patient will schedule . Repeat every 5 years  Lung Cancer Screening: (Low Dose CT Chest recommended if Age 37-80 years, 30 pack-year currently smoking OR have quit w/in 15years.) does not qualify.   Lung Cancer Screening Referral: n/a  Additional Screening:  Hepatitis C Screening: does qualify;   Vision Screening: Recommended annual ophthalmology exams for early detection of glaucoma and other disorders of the eye. Is the patient up  to date with their annual eye exam?  Yes  Who is the provider or what is the name of the office in which the patient attends annual eye exams? [facility]  If pt is not established with a provider, would they like to be referred to a provider to establish care? No .   Dental Screening: Recommended annual dental exams for proper oral hygiene  Community Resource Referral / Chronic Care Management: CRR required this visit?  No   CCM required this visit?  No      Plan:     I have personally reviewed and noted the following in the patient's chart:   Medical and social history Use of alcohol, tobacco or illicit drugs  Current medications and supplements including opioid prescriptions. Patient is not currently taking opioid prescriptions. Functional ability and status Nutritional status Physical activity Advanced directives List of other physicians Hospitalizations, surgeries, and ER visits in previous 12 months Vitals Screenings to include cognitive, depression, and falls Referrals and appointments  In addition, I have reviewed and discussed with patient certain preventive protocols, quality metrics, and best practice recommendations. A written personalized care plan for preventive services as well as general preventive health recommendations were provided to patient.     Randel Pigg, LPN   [id number]   Nurse Notes: none

## 2021-03-22 ENCOUNTER — Ambulatory Visit (INDEPENDENT_AMBULATORY_CARE_PROVIDER_SITE_OTHER): Payer: Medicare Other | Admitting: Neurology

## 2021-03-22 ENCOUNTER — Other Ambulatory Visit: Payer: Self-pay

## 2021-03-22 ENCOUNTER — Encounter: Payer: Self-pay | Admitting: Neurology

## 2021-03-22 VITALS — BP 133/84 | HR 66 | Ht 70.0 in | Wt 203.8 lb

## 2021-03-22 DIAGNOSIS — R404 Transient alteration of awareness: Secondary | ICD-10-CM

## 2021-03-22 DIAGNOSIS — H539 Unspecified visual disturbance: Secondary | ICD-10-CM | POA: Diagnosis not present

## 2021-03-22 NOTE — Patient Instructions (Signed)
Good to meet you.  Schedule 1-hour EEG  2. Schedule carotid ultrasound  3. Proceed with MRI brain as scheduled  4. Would avoid driving for another month, unless symptoms are recurrent, then you should not drive for 6 months  5. Follow-up after tests, call for any changes

## 2021-03-22 NOTE — Progress Notes (Signed)
NEUROLOGY CONSULTATION NOTE  CHRISTORPHER HISAW MRN: [id number] DOB: [birth year]  Referring provider: Dr. Micheline Rough Primary care provider: Dr. Micheline Rough  Reason for consult:  vision changes, seizure-like activity  Dear Dr Ethlyn Gallery:  Thank you for your kind referral of Raymond Vasquez for consultation of the above symptoms. Although his history is well known to you, please allow me to reiterate it for the purpose of our medical record. The patient was accompanied to the clinic by his wife Malachy Mood who also provides collateral information. Records and images were personally reviewed where available.   HISTORY OF PRESENT ILLNESS: This is a pleasant 70 year old right-handed man with a history of hypertension, hyperlipidemia, DM, PMR, presenting for evaluation of vision changes with seizure-like activity last 03/05/2021. He was in his usual state of health that morning, he had coffee but nothing else to eat, went to his wife's doctor's appointment, then as they were having lunch at [employer] at around 1:40pm (he had a few pieces of fries), his wife notes he went into a stare, his hands were in fists flexed at the elbows with low amplitude shaking. He recalls feeling his eyes were getting blurry and he saw little round spots that were rotating, then he felt his body tensing up but he could not control it. He recalls telling his body to snap out of it and could hear his wife calling his name. His wife notes that she called his name a few times and he did not respond at first. It was very brief, lasting 20-30 seconds, he tried to get himself together and was able to speak, feeling normal. No headache, dizziness, focal weakness. No tongue bite or incontinence. The vision symptoms had resolved. His wife drove home and he was back to baseline at that point, glucose level at home was 215. EMS was called with normal vital signs. The next evening, he was watching TV and started seeing the dots in his  vision again, he was trying to focus but the little round dots of blur kept circling for 20 minutes, no associated headache, dizziness, focal symptoms, no stiffening. The last episode was last night lasting 30 minutes. He denied feeling dizzy, "just trying to focus." He did not test each eye individually. He reports these vision symptoms have been occurring "here and there" the past year. He denies any changes in sleep pattern. He has a history of migraines with visual aura where vision is blurred in one eye, different from these episodes. He has not had a migraine in 21 years. His wife denies any other staring episodes, he denies any gaps in time, no olfactory/gustatory hallucinations, deja vu, rising epigastric sensation, focal numbness/tingling/weakness, myoclonic jerks. He has stiffness from trigger finger in both hands. He does not check glucose levels as regularly as advised, but states he does not have hypoglycemia when he checks levels. He had a normal birth and early development.  There is no history of febrile convulsions, CNS infections such as meningitis/encephalitis, significant traumatic brain injury, neurosurgical procedures, or family history of seizures.   Lab Results  Component Value Date   HGBA1C 7.4 (H) 03/10/2021     PAST MEDICAL HISTORY: Past Medical History:  Diagnosis Date   Angioedema    he is unsure of this or cause   Balanitis 04/12/2013   Diabetes mellitus type II, controlled, with no complications ([place])    Hemorrhoids    History of laparoscopic adjustable gastric banding, 08/10/2009 07/05/2013   Hyperlipidemia  Hypertension    Nephrolithiasis    1980s   OSA (obstructive sleep apnea)    PMR (polymyalgia rheumatica) ([place])    Shingles    2014    PAST SURGICAL HISTORY: Past Surgical History:  Procedure Laterality Date   APPENDECTOMY     FINGER SURGERY  06/2012   LAPAROSCOPIC GASTRIC BANDING  08/10/2009   ROTATOR CUFF REPAIR Right 06/2019   Dr Noemi Chapel     MEDICATIONS: Current Outpatient Medications on File Prior to Visit  Medication Sig Dispense Refill   diclofenac (VOLTAREN) 75 MG EC tablet Take 75 mg by mouth 2 (two) times daily.      fluconazole (DIFLUCAN) 100 MG tablet Take 1 tablet (100 mg total) by mouth daily. 7 tablet 0   folic acid (FOLVITE) 1 MG tablet Take 1 mg by mouth daily.     glimepiride (AMARYL) 4 MG tablet TAKE 1/2 TABLET BY MOUTH WITH DINNER 90 tablet 0   JARDIANCE 25 MG TABS tablet TAKE 1 TABLET BY MOUTH DAILY 30 tablet 2   metFORMIN (GLUCOPHAGE) 1000 MG tablet TAKE 1 TABLET BY MOUTH TWICE DAILY WITH MEAL 180 tablet 1   Methotrexate Sodium (METHOTREXATE, PF,) 50 MG/2ML injection INJECT 1 ML ONCE A WEEK     ONETOUCH DELICA LANCETS FINE MISC Use as instructed by provider 200 each 3   ONETOUCH VERIO test strip USE AS INSTRUCTED 50 strip 2   rosuvastatin (CRESTOR) 20 MG tablet TAKE 1 TABLET(20 MG) BY MOUTH DAILY 90 tablet 1   TRULICITY 3 NU/2.7OZ SOPN INJECT CONTENTS OF 1 PEN INTO THE SKIN ONCE WEEKLY 2 mL 2   Current Facility-Administered Medications on File Prior to Visit  Medication Dose Route Frequency Provider Last Rate Last Admin   0.9 %  sodium chloride infusion  500 mL Intravenous Once Pyrtle, Lajuan Lines, MD       0.9 %  sodium chloride infusion  500 mL Intravenous Once Pyrtle, Lajuan Lines, MD        ALLERGIES: No Known Allergies  FAMILY HISTORY: Family History  Problem Relation Age of Onset   Heart disease Mother    Diabetes Maternal Grandmother     SOCIAL HISTORY: Social History   Socioeconomic History   Marital status: Married    Spouse name: Not on file   Number of children: Not on file   Years of education: Not on file   Highest education level: Not on file  Occupational History   Occupation: retired  Tobacco Use   Smoking status: Some Days    Types: Cigars   Smokeless tobacco: Current    Types: Chew   Tobacco comments:    1 cigar occassionally, not more then 1x per week  Vaping Use   Vaping  Use: Never used  Substance and Sexual Activity   Alcohol use: No   Drug use: No   Sexual activity: Not on file  Other Topics Concern   Not on file  Social History Narrative   Work or School: retired Armed forces technical officer with [employer] Situation: lives with wifes      Spiritual Beliefs: none      Lifestyle: no regular exercise, healthy diet      Right handed    Social Determinants of Health   Financial Resource Strain: Low Risk    Difficulty of Paying Living Expenses: Not hard at all  Food Insecurity: No Food Insecurity   Worried About Charity fundraiser in the  Last Year: Never true   Ran Out of Food in the Last Year: Never true  Transportation Needs: No Transportation Needs   Lack of Transportation (Medical): No   Lack of Transportation (Non-Medical): No  Physical Activity: Inactive   Days of Exercise per Week: 0 days   Minutes of Exercise per Session: 0 min  Stress: No Stress Concern Present   Feeling of Stress : Not at all  Social Connections: Moderately Isolated   Frequency of Communication with Friends and Family: Twice a week   Frequency of Social Gatherings with Friends and Family: Twice a week   Attends Religious Services: Never   Printmaker: No   Attends Music therapist: Never   Marital Status: Married  Human resources officer Violence: Not At Risk   Fear of Current or Ex-Partner: No   Emotionally Abused: No   Physically Abused: No   Sexually Abused: No     PHYSICAL EXAM: Vitals:   03/22/21 0840  BP: 133/84  Pulse: 66  SpO2: 97%   General: No acute distress Head:  Normocephalic/atraumatic Skin/Extremities: No rash, no edema Neurological Exam: Mental status: alert and oriented to person, place, and time, no dysarthria or aphasia, Fund of knowledge is appropriate.  Recent and remote memory are intact, 3/3 delayed recall.  Attention and concentration are normal, 5/5 WORLD backward. Cranial nerves: CN  I: not tested CN II: pupils equal, round and reactive to light, visual fields intact CN III, IV, VI:  full range of motion, no nystagmus, no ptosis CN V: facial sensation intact CN VII: upper and lower face symmetric CN VIII: hearing intact to conversation Bulk & Tone: normal, no fasciculations. Motor: 5/5 throughout with no pronator drift. Sensation: intact to light touch, cold, pin, vibration sense.  No extinction to double simultaneous stimulation.  Romberg test negative Deep Tendon Reflexes: +1 throughout Cerebellar: no incoordination on finger to nose testing Gait: narrow-based and steady, able to tandem walk adequately. Tremor: none   IMPRESSION: This is a pleasant 70 year old right-handed man with a history of This is a pleasant 70 year old right-handed man with a history of hypertension, hyperlipidemia, DM, PMR, presenting for evaluation of vision changes with seizure-like activity last 03/05/2021.  His neurological exam is normal, no clear epilepsy risk factors. Etiology of symptoms unclear, convulsive syncope versus seizure. Hypoglycemia is still a possibility. The vision changes may be a visual aura, but can also be seen with ocular migraines. MRI brain has been scheduled, we will do a 1-hour EEG and carotid dopplers to further evaluate symptoms. [place] laws were discussed with the patient. Follow-up after tests, he knows to call for any changes.    Thank you for allowing me to participate in the care of this patient. Please do not hesitate to call for any questions or concerns.   Ellouise Newer, M.D.  CC: Dr. Ethlyn Gallery

## 2021-03-29 ENCOUNTER — Other Ambulatory Visit: Payer: Self-pay | Admitting: Endocrinology

## 2021-04-07 ENCOUNTER — Other Ambulatory Visit: Payer: Self-pay

## 2021-04-07 ENCOUNTER — Ambulatory Visit
Admission: RE | Admit: 2021-04-07 | Discharge: 2021-04-07 | Disposition: A | Discharge status: Home / Self Care | Payer: Medicare Other | Source: Ambulatory Visit | Attending: Family Medicine | Admitting: Family Medicine

## 2021-04-07 DIAGNOSIS — R569 Unspecified convulsions: Secondary | ICD-10-CM

## 2021-04-07 IMAGING — MR MR HEAD W/O CM
12 series · 45 of 48 positions shown · non-contrast
Comparison: None.

CLINICAL DATA: Seizure-like activity.

EXAM:
MRI HEAD WITHOUT CONTRAST
TECHNIQUE: Multiplanar, multiecho pulse sequences of the brain and surrounding
structures were obtained without intravenous contrast.

[Series 2: T1 · sagittal · 5.0mm · 0.45mm/px · 1 of 21 slices shown]
[im 1/21]
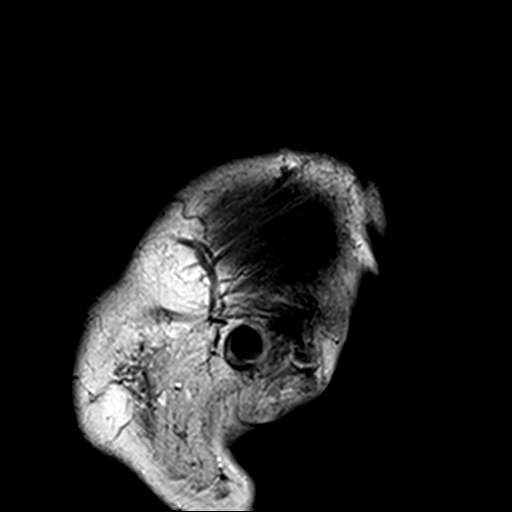

[Series 3: DWI · axial · 3.0mm · 1.80mm/px · z∈[-57,+97]mm · 8 of 105 slices shown (1 of 4)]
[im 1/105]
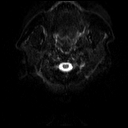
[im 15/105]
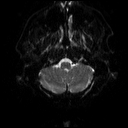
[im 30/105]
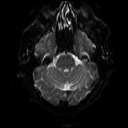
[im 45/105]
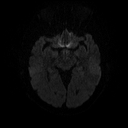
[im 60/105]
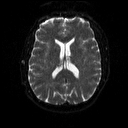
[im 75/105]
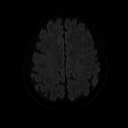
[im 90/105]
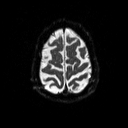
[im 105/105]
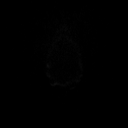

[Series 4: DWI · axial · 3.0mm · 1.80mm/px · z∈[-57,+97]mm · 4 of 47 slices shown (2 of 4)]
[im 1/47]
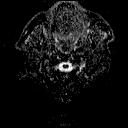
[im 16/47]
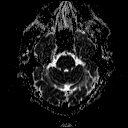
[im 31/47]
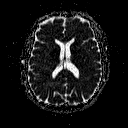
[im 47/47]
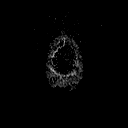

[Series 5: DWI · coronal · 5.0mm · 1.80mm/px · 5 of 72 slices shown (3 of 4)]
[im 1/72]
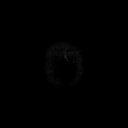
[im 18/72]
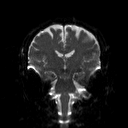
[im 36/72]
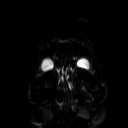
[im 54/72]
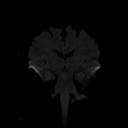
[im 72/72]
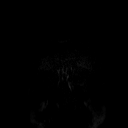

[Series 6: DWI · coronal · 5.0mm · 1.80mm/px · 3 of 36 slices shown (4 of 4)]
[im 1/36]
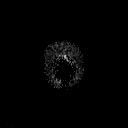
[im 18/36]
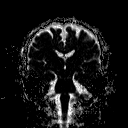
[im 36/36]
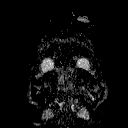

[Series 7: T2 · axial · 5.0mm · 0.51mm/px · z∈[-56,+96]mm · 2 of 23 slices shown (1 of 3)]
[im 1/23]
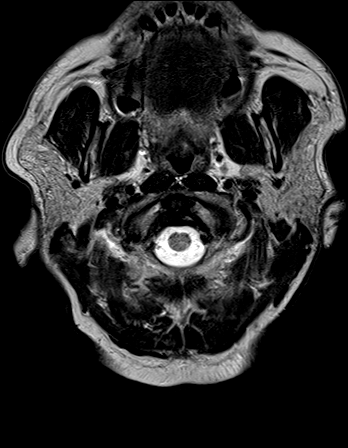
[im 23/23]
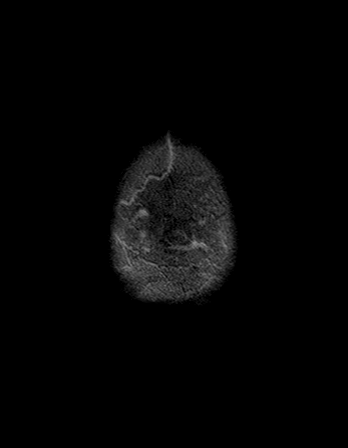

[Series 8: FLAIR · axial · 3.0mm · 0.45mm/px · z∈[-56,+96]mm · 3 of 34 slices shown (1 of 2)]
[im 1/34]
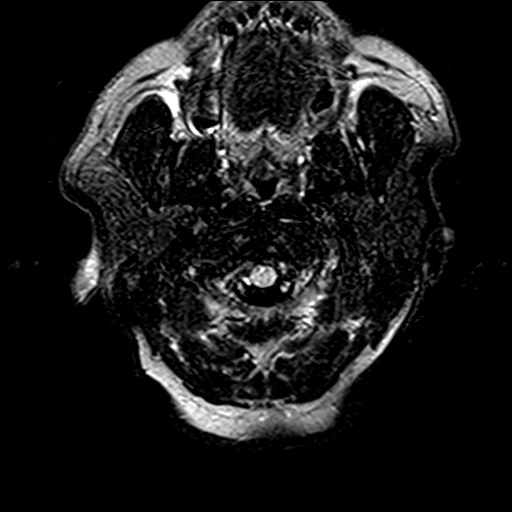
[im 17/34]
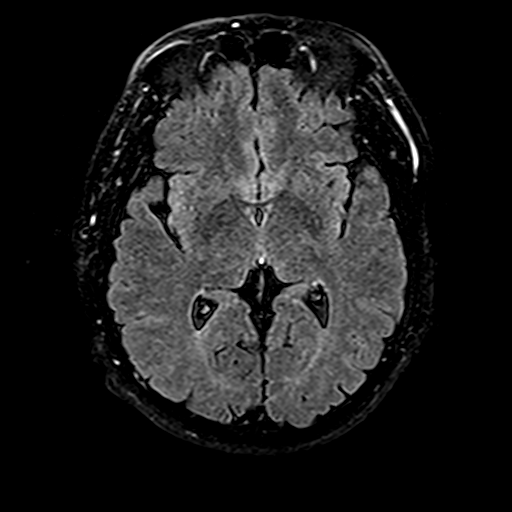
[im 34/34]
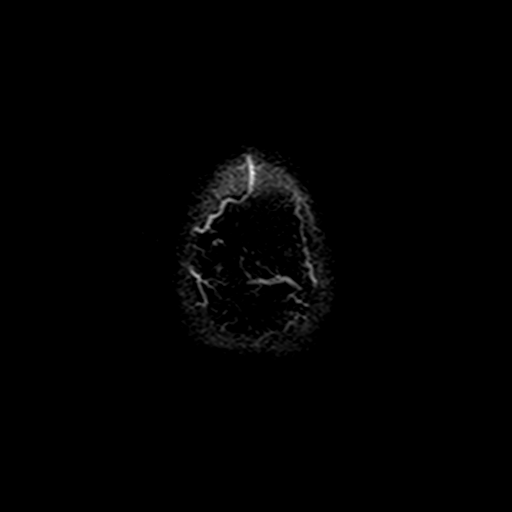

[Series 10: swi_images · axial · 4.0mm · 0.90mm/px · z∈[-57,+97]mm · 3 of 40 slices shown]
[im 1/40]
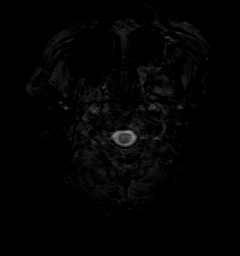
[im 20/40]
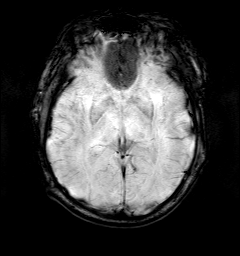
[im 40/40]
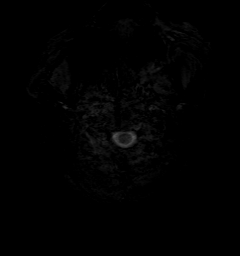

[Series 11: T2 · coronal · 3.0mm · 0.23mm/px · 2 of 32 slices shown (2 of 3)]
[im 1/32]
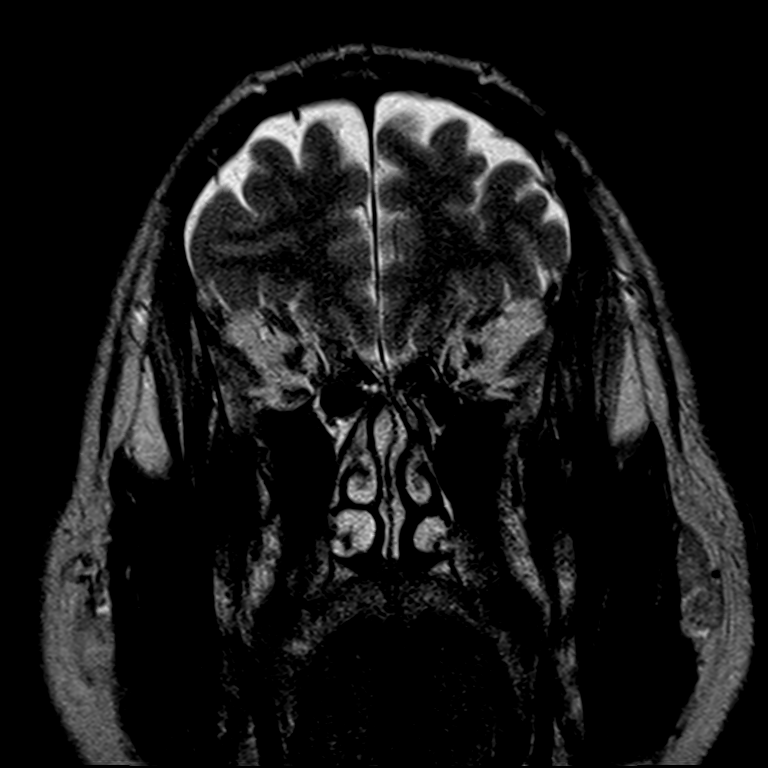
[im 32/32]
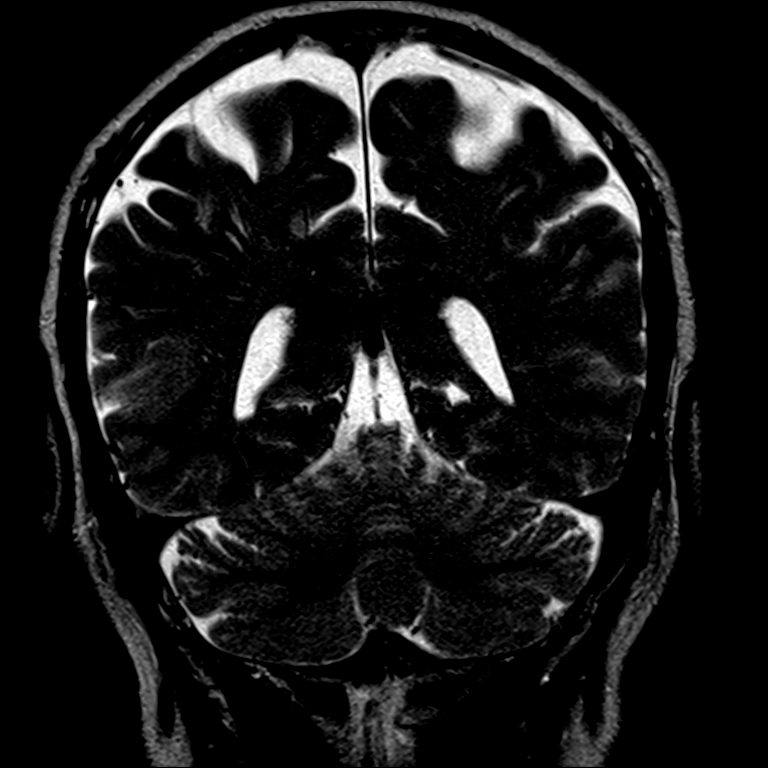

[Series 12: FLAIR · coronal · 3.0mm · 0.35mm/px · 2 of 32 slices shown (2 of 2)]
[im 1/32]
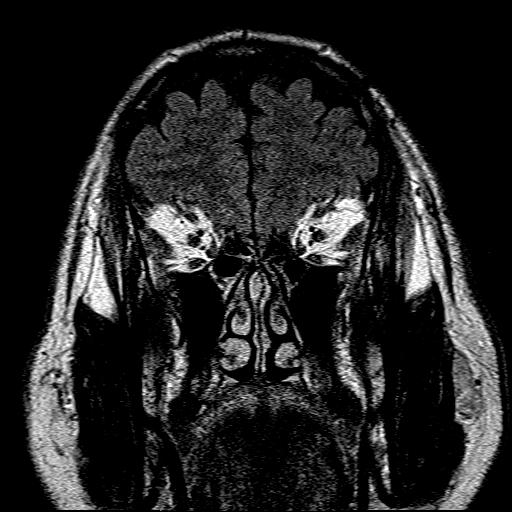
[im 32/32]
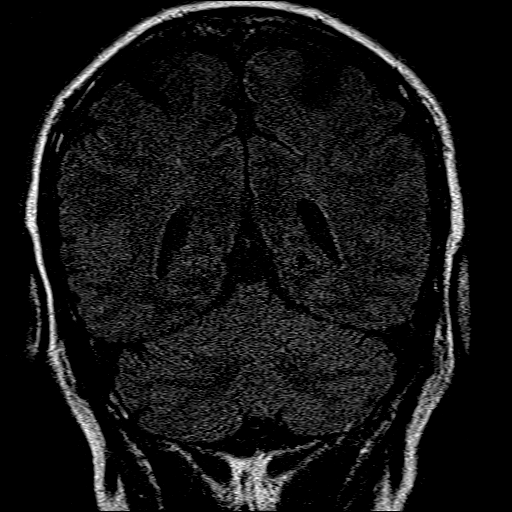

[Series 13: t1_mpr_tra · axial · 1.0mm · 0.71mm/px · z∈[-59,+98]mm · 9 of 160 slices shown]
[im 1/160]
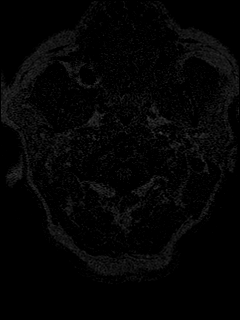
[im 15/160]
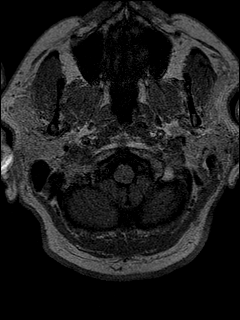
[im 29/160]
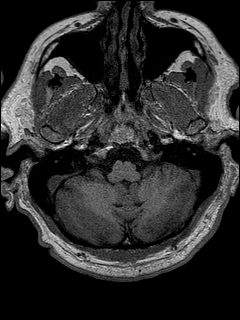
[im 44/160]
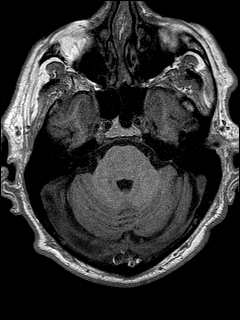
[im 73/160]
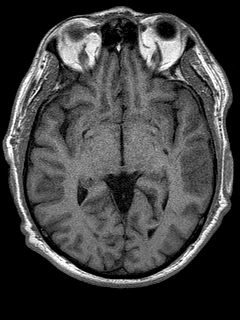
[im 87/160]
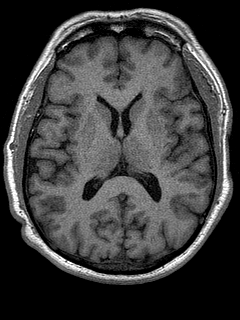
[im 116/160]
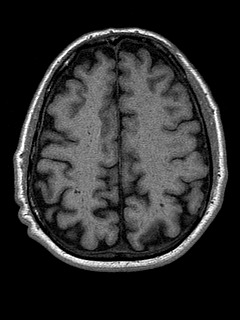
[im 131/160]
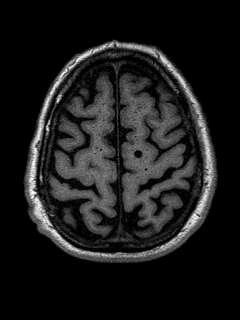
[im 160/160]
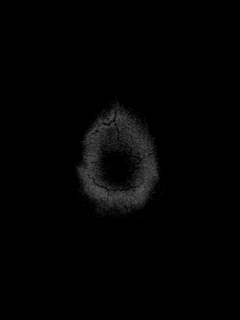

[Series 14: T2 · coronal · 5.0mm · 0.45mm/px · 3 of 36 slices shown (3 of 3)]
[im 1/36]
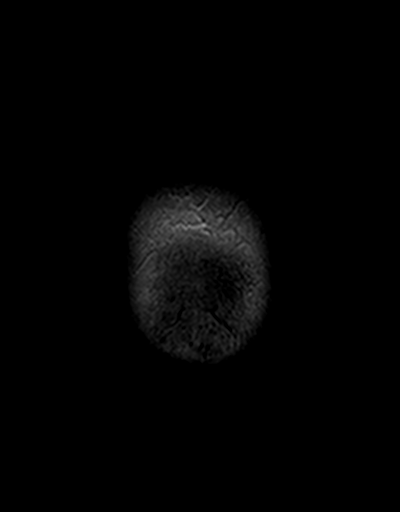
[im 18/36]
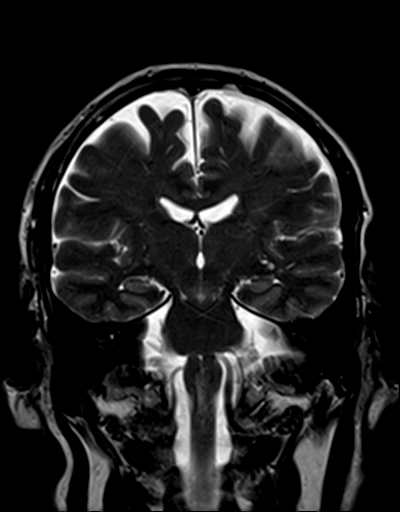
[im 36/36]
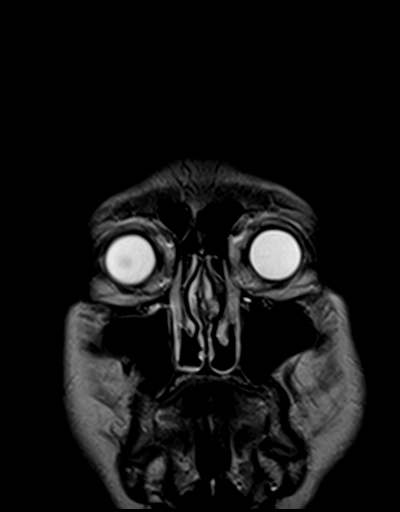

[45 of 48 positions shown; findings below may reference images not displayed]

FINDINGS: Brain: No acute infarction, hemorrhage, hydrocephalus, extra-axial
collection or mass lesion. Mesial temporal lobes are symmetric with
normal morphology and signal characteristics. No significant white
matter disease.

Vascular: Normal flow voids.

Skull and upper cervical spine: Normal marrow signal.

Sinuses/Orbits: Negative.

Other: None.
IMPRESSION: Unremarkable MRI of the brain.

## 2021-04-09 ENCOUNTER — Other Ambulatory Visit (INDEPENDENT_AMBULATORY_CARE_PROVIDER_SITE_OTHER): Payer: Medicare Other

## 2021-04-09 ENCOUNTER — Other Ambulatory Visit: Payer: Self-pay

## 2021-04-09 DIAGNOSIS — E1165 Type 2 diabetes mellitus with hyperglycemia: Secondary | ICD-10-CM | POA: Diagnosis not present

## 2021-04-09 LAB — COMPREHENSIVE METABOLIC PANEL
ALT: 51 U/L (ref 0–53)
AST: 29 U/L (ref 0–37)
Albumin: 4.8 g/dL (ref 3.5–5.2)
Alkaline Phosphatase: 45 U/L (ref 39–117)
BUN: 21 mg/dL (ref 6–23)
CO2: 32 mEq/L (ref 19–32)
Calcium: 10.3 mg/dL (ref 8.4–10.5)
Chloride: 100 mEq/L (ref 96–112)
Creatinine, Ser: 0.85 mg/dL (ref 0.40–1.50)
GFR: 89.57 mL/min (ref >60.00)
Glucose, Bld: 184 mg/dL — ABNORMAL HIGH (ref 70–99)
Potassium: 4.7 mEq/L (ref 3.5–5.1)
Sodium: 141 mEq/L (ref 135–145)
Total Bilirubin: 0.7 mg/dL (ref 0.2–1.2)
Total Protein: 6.7 g/dL (ref 6.0–8.3)

## 2021-04-09 LAB — MICROALBUMIN / CREATININE URINE RATIO
Creatinine,U: 28 mg/dL
Microalb Creat Ratio: 2.5 mg/g (ref 0.0–30.0)
Microalb, Ur: <0.7 mg/dL (ref 0.0–1.9)

## 2021-04-09 LAB — HEMOGLOBIN A1C: Hgb A1c MFr Bld: 7.7 % — ABNORMAL HIGH (ref 4.6–6.5)

## 2021-04-12 ENCOUNTER — Other Ambulatory Visit: Payer: Self-pay

## 2021-04-12 ENCOUNTER — Ambulatory Visit (INDEPENDENT_AMBULATORY_CARE_PROVIDER_SITE_OTHER): Payer: Medicare Other | Admitting: Neurology

## 2021-04-12 DIAGNOSIS — R404 Transient alteration of awareness: Secondary | ICD-10-CM

## 2021-04-12 DIAGNOSIS — H539 Unspecified visual disturbance: Secondary | ICD-10-CM

## 2021-04-13 NOTE — Procedures (Signed)
ELECTROENCEPHALOGRAM REPORT  Date of Study: 04/12/2021  Patient's Name: Raymond Vasquez MRN: [id number] Date of Birth: [birth year]  Referring Provider: Dr. Ellouise Newer  Clinical History: This is a 70 year old man with an episode of staring with slight shaking. EEG for classification.  Medications: VOLTAREN 75 MG EC tablet DIFLUCAN 100 MG tablet FOLVITE 1 MG tablet AMARYL 4 MG tablet JARDIANCE 25 MG TABS tablet GLUCOPHAGE 1000 MG tablet METHOTREXATE, PF, 50 MG/2ML injection CRESTOR 20 MG tablet TRULICITY 3 DQ/2.2WL SOPN  Technical Summary: A multichannel digital 1-hour EEG recording measured by the international 10-20 system with electrodes applied with paste and impedances below 5000 ohms performed in our laboratory with EKG monitoring in an awake and asleep patient.  Hyperventilation was not performed. Photic stimulation was performed.  The digital EEG was referentially recorded, reformatted, and digitally filtered in a variety of bipolar and referential montages for optimal display.    Description: The patient is awake and asleep during the recording.  During maximal wakefulness, there is a symmetric, medium voltage 9 Hz posterior dominant rhythm that attenuates with eye opening.  The record is symmetric.  During drowsiness and sleep, there is an increase in theta slowing of the background.  Vertex waves and symmetric sleep spindles were seen.  Photic stimulation did not elicit any abnormalities.  There were no epileptiform discharges or electrographic seizures seen.    EKG lead showed sinus bradycardia at 60 bpm  Impression: This 1-hour awake and asleep EEG is normal.    Clinical Correlation: A normal EEG does not exclude a clinical diagnosis of epilepsy.  If further clinical questions remain, prolonged EEG may be helpful.  Clinical correlation is advised.   Ellouise Newer, M.D.

## 2021-04-14 ENCOUNTER — Ambulatory Visit: Payer: Medicare Other | Admitting: Endocrinology

## 2021-04-19 ENCOUNTER — Ambulatory Visit
Admission: RE | Admit: 2021-04-19 | Discharge: 2021-04-19 | Disposition: A | Discharge status: Home / Self Care | Payer: Medicare Other | Source: Ambulatory Visit | Attending: Neurology | Admitting: Neurology

## 2021-04-19 DIAGNOSIS — H539 Unspecified visual disturbance: Secondary | ICD-10-CM

## 2021-04-19 DIAGNOSIS — R404 Transient alteration of awareness: Secondary | ICD-10-CM

## 2021-04-19 IMAGING — US US CAROTID DUPLEX BILAT
1 series · 13 of 24 positions shown · non-contrast
Comparison: None.

CLINICAL DATA: Hypertension, hyperlipidemia, visual changes

EXAM:
BILATERAL CAROTID DUPLEX ULTRASOUND
TECHNIQUE: Gray scale imaging, color Doppler and duplex ultrasound were
performed of bilateral carotid and vertebral arteries in the neck.

[Series 1: us carotid duplex bilat · 0.06mm/px · 13 of 61 slices shown]
[im 1/61]
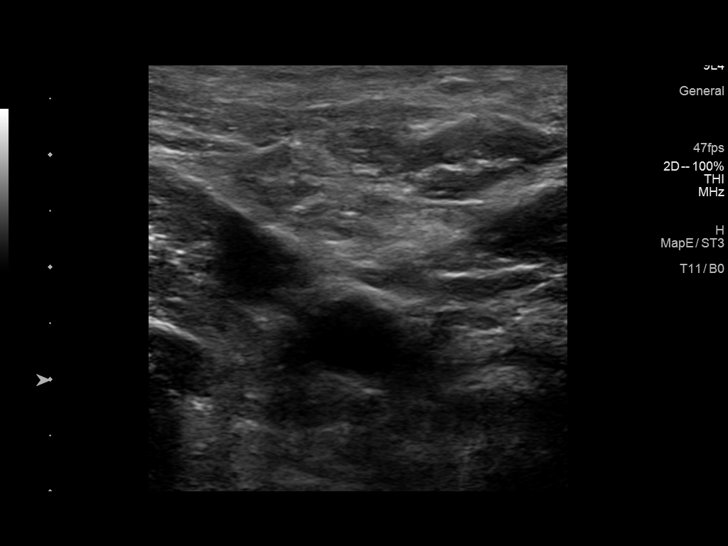
[im 6/61]
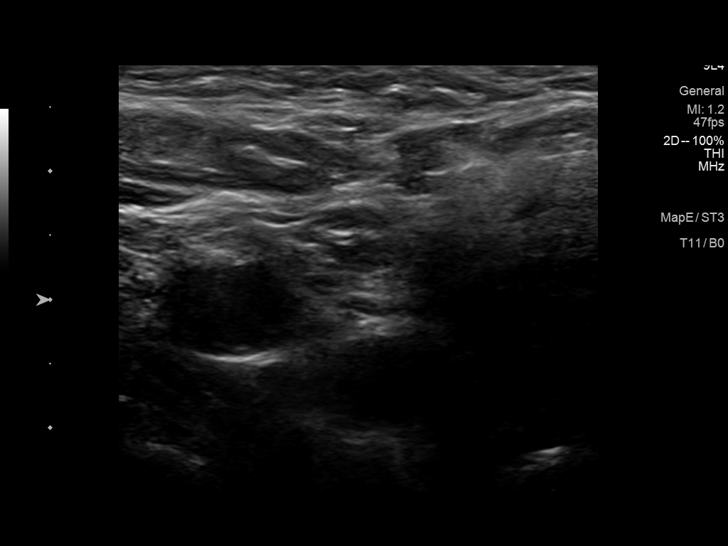
[im 11/61]
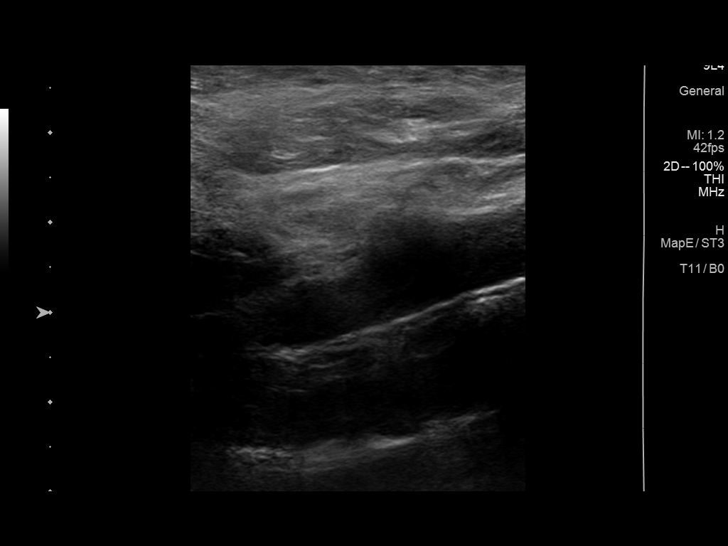
[im 16/61]
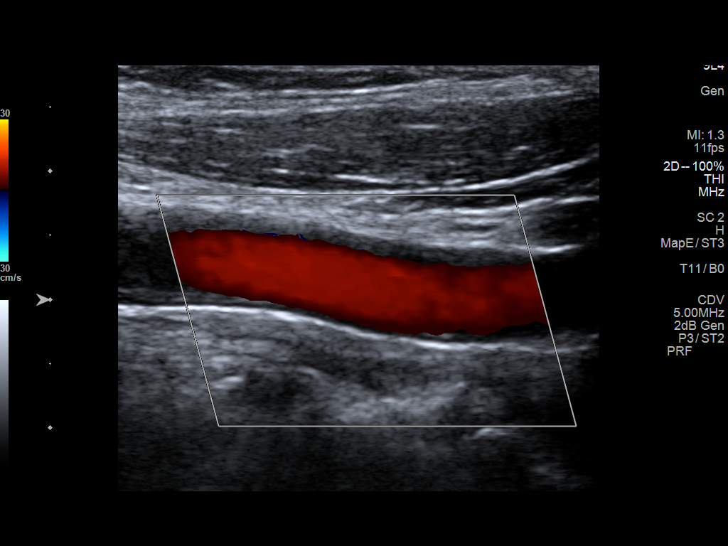
[im 21/61]
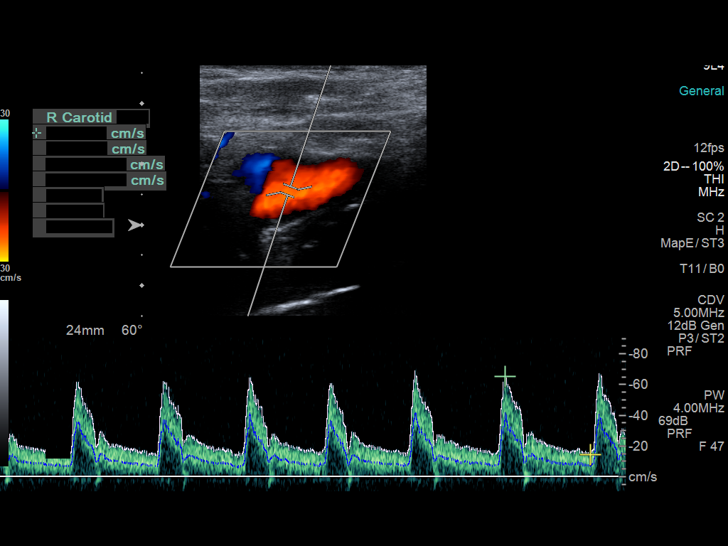
[im 27/61]
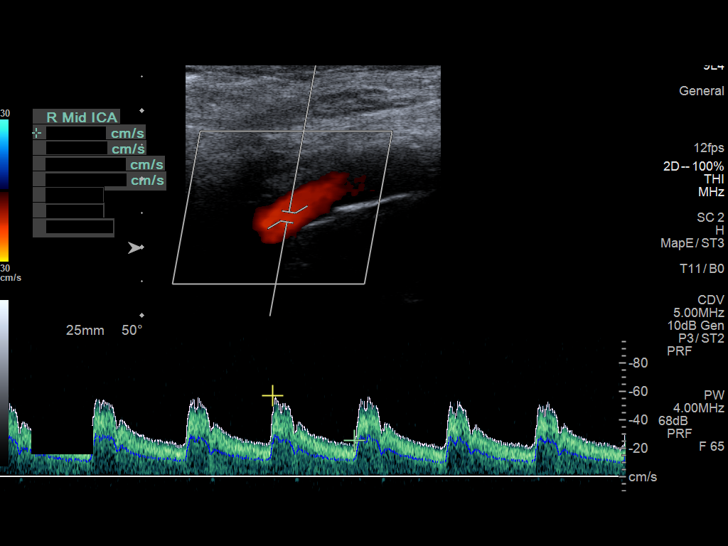
[im 32/61]
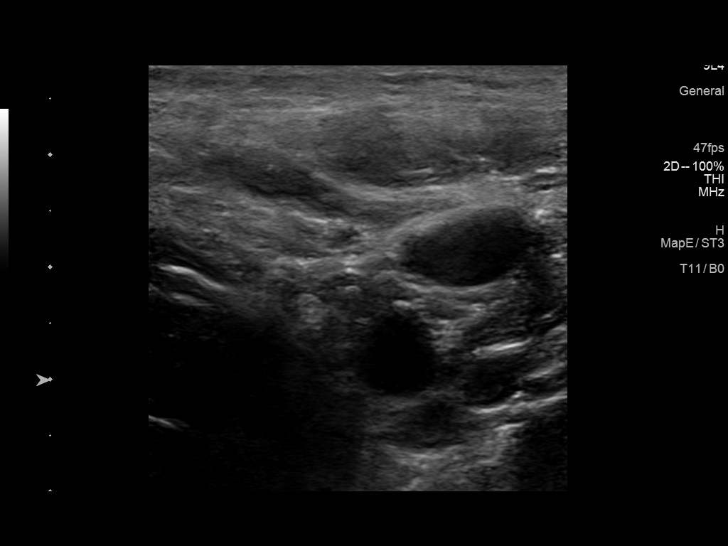
[im 34/61]
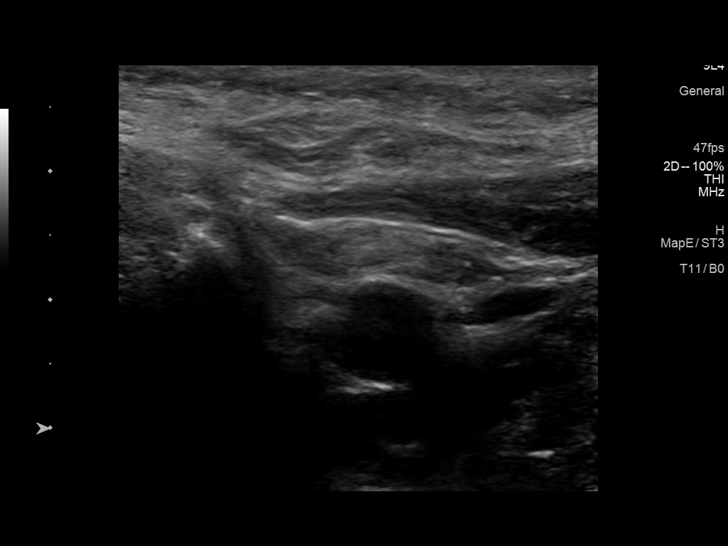
[im 40/61]
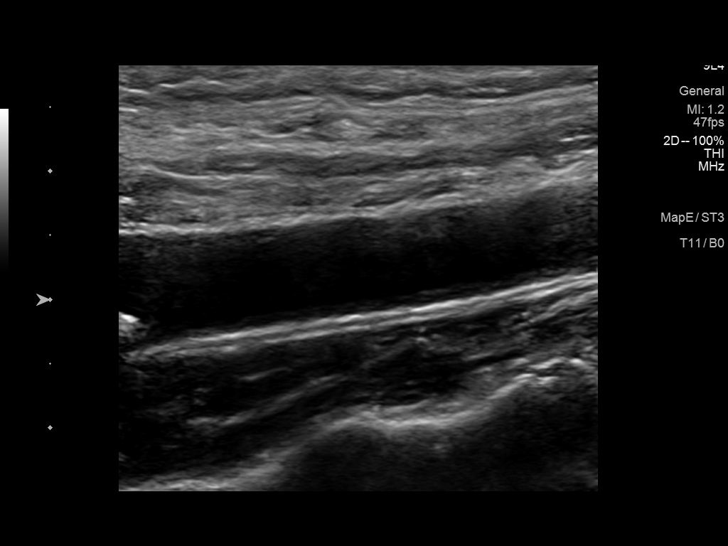
[im 45/61]
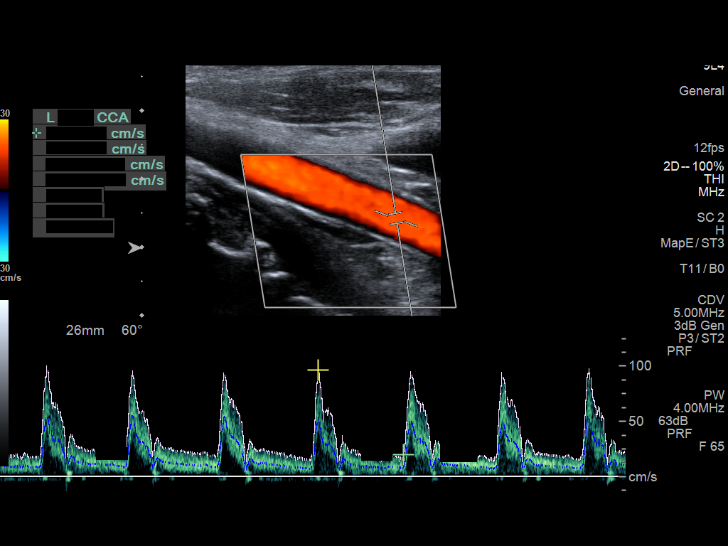
[im 50/61]
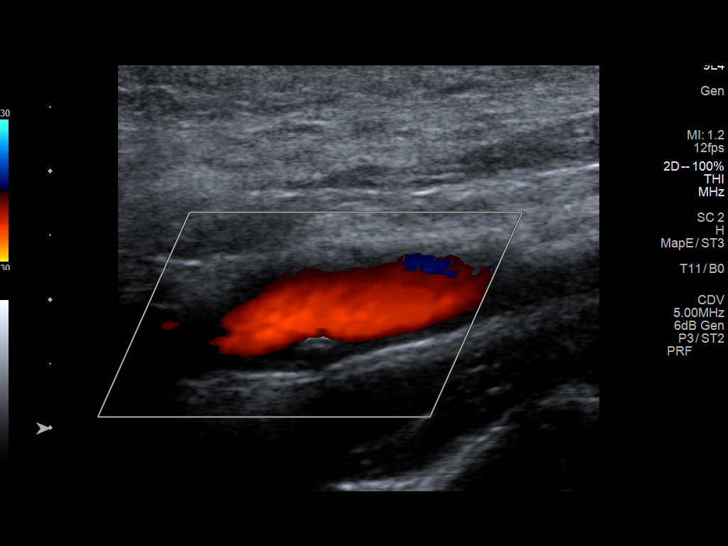
[im 55/61]
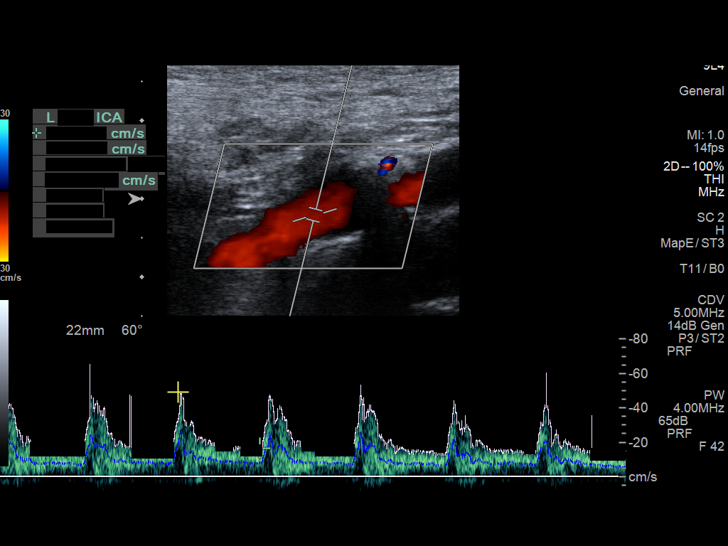
[im 61/61]
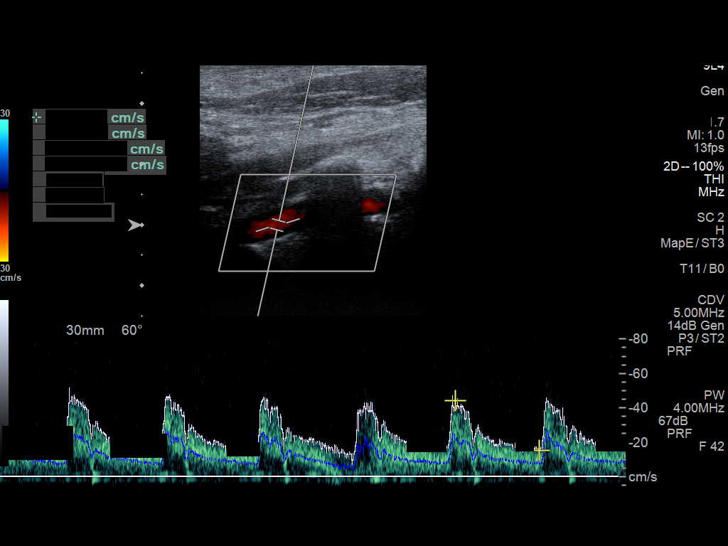

[13 of 24 positions shown; findings below may reference images not displayed]

FINDINGS: Criteria: Quantification of carotid stenosis is based on velocity
parameters that correlate the residual internal carotid diameter
with NASCET-based stenosis levels, using the diameter of the distal
internal carotid lumen as the denominator for stenosis measurement.

The following velocity measurements were obtained:

RIGHT

ICA: 64/27 cm/sec

CCA: 78/17 cm/sec

SYSTOLIC ICA/CCA RATIO:

ECA: 64 cm/sec

LEFT

ICA: 62/25 cm/sec

CCA: 96/20 cm/sec

SYSTOLIC ICA/CCA RATIO:

ECA: 76 cm/sec

RIGHT CAROTID ARTERY: Minor echogenic shadowing plaque formation. No
hemodynamically significant right ICA stenosis, velocity elevation,
or turbulent flow. Degree of narrowing less than 50%.

RIGHT VERTEBRAL ARTERY:  Normal antegrade flow

LEFT CAROTID ARTERY: Similar scattered minor echogenic plaque
formation. No hemodynamically significant left ICA stenosis,
velocity elevation, or turbulent flow.

LEFT VERTEBRAL ARTERY:  Normal antegrade flow

Upper extremity blood pressures: RIGHT: 154/79 LEFT: 147/80
IMPRESSION: Minor bilateral carotid atherosclerosis. Negative for significant
stenosis. Degree of narrowing less than 50% bilaterally by
ultrasound criteria.

Patent antegrade vertebral flow bilaterally

## 2021-04-19 NOTE — Progress Notes (Signed)
Patient ID: Raymond Vasquez, male   DOB: 1951-01-05, 70 y.o.   MRN: [id number]          Reason for Appointment: Follow-up for Type 2 Diabetes    History of Present Illness:          Date of diagnosis of type 2 diabetes mellitus: 2003 approximately       Recent history:    Non-insulin hypoglycemic drugs the patient is taking are: Metformin 1 g twice daily, Amaryl 2 mg in p.m., Trulicity 3 mg weekly, Jardiance 25 mg daily  Current management, blood sugar patterns and problems identified: He has a progressively higher A1c and now 7.7, has been as low as 6.4 before Although his blood sugar may have been higher for couple of days when he was out of his Trulicity his blood sugars have still been as high as 296 fasting He does not think he has changed his diet and is still eating healthy He is not able to do much exercise and has not been recently motivated also  Weight is the same He does not think he has any change in the satiety level with continuing 3 mg Trulicity which does not cause any nausea  Lab fasting glucose 184 Despite reminders he forgets to check his readings after meals His blood sugars recently have still not come down with restarting Trulicity but have been as low as 119       Side effects from medications have been: None    Typical meal intake: Breakfast is usually skipped, sometimes juice and muffin.  Using smaller portions of meats now            Glucose monitoring:  done 1 times a day         Glucometer: One Touch Verio.       Blood Glucose readings from monitor download:   PRE-MEAL Fasting Lunch Dinner Bedtime Overall  Glucose range: 119-296      Mean/median:     171   POST-MEAL PC Breakfast PC Lunch PC Dinner  Glucose range:   ?  Mean/median:        Previous fasting range 88-148, AVERAGE 116  No readings after meals  Dietician visit, most recent: Before his lap band surgery  Weight history: Maximum 280 in the past  Wt Readings from Last 3  Encounters:  04/20/21 201 lb 9.6 oz (91.4 kg)  03/22/21 203 lb 12.8 oz (92.4 kg)  03/10/21 200 lb 4.8 oz (90.9 kg)    Glycemic control:   Lab Results  Component Value Date   HGBA1C 7.7 (H) 04/09/2021   HGBA1C 7.4 (H) 03/10/2021   HGBA1C 6.7 (H) 09/22/2020   Lab Results  Component Value Date   MICROALBUR <0.7 04/09/2021   LDLCALC 45 03/10/2021   CREATININE 0.85 04/09/2021   Lab Results  Component Value Date   MICRALBCREAT 2.5 04/09/2021    Lab Results  Component Value Date   FRUCTOSAMINE 263 03/07/2019   FRUCTOSAMINE 281 03/22/2018    No visits with results within 1 Week(s) from this visit.  Latest known visit with results is:  Lab on 04/09/2021  Component Date Value Ref Range Status   Microalb, Ur 04/09/2021 <0.7  0.0 - 1.9 mg/dL Final   Creatinine,U 04/09/2021 28.0  mg/dL Final   Microalb Creat Ratio 04/09/2021 2.5  0.0 - 30.0 mg/g Final   Sodium 04/09/2021 141  135 - 145 mEq/L Final   Potassium 04/09/2021 4.7  3.5 - 5.1 mEq/L Final  Chloride 04/09/2021 100  96 - 112 mEq/L Final   CO2 04/09/2021 32  19 - 32 mEq/L Final   Glucose, Bld 04/09/2021 184 (H)  70 - 99 mg/dL Final   BUN 04/09/2021 21  6 - 23 mg/dL Final   Creatinine, Ser 04/09/2021 0.85  0.40 - 1.50 mg/dL Final   Total Bilirubin 04/09/2021 0.7  0.2 - 1.2 mg/dL Final   Alkaline Phosphatase 04/09/2021 45  39 - 117 U/L Final   AST 04/09/2021 29  0 - 37 U/L Final   ALT 04/09/2021 51  0 - 53 U/L Final   Total Protein 04/09/2021 6.7  6.0 - 8.3 g/dL Final   Albumin 04/09/2021 4.8  3.5 - 5.2 g/dL Final   GFR 04/09/2021 89.57  >60.00 mL/min Final   Calculated using the CKD-EPI Creatinine Equation (2021)   Calcium 04/09/2021 10.3  8.4 - 10.5 mg/dL Final   Hgb A1c MFr Bld 04/09/2021 7.7 (H)  4.6 - 6.5 % Final   Glycemic Control Guidelines for People with Diabetes:Non Diabetic:  <6%Goal of Therapy: <7%Additional Action Suggested:  >8%     Allergies as of 04/20/2021   No Known Allergies      Medication  List        Accurate as of April 20, 2021  3:04 PM. If you have any questions, ask your nurse or doctor.          diclofenac 75 MG EC tablet Commonly known as: VOLTAREN Take 75 mg by mouth 2 (two) times daily.   fluconazole 100 MG tablet Commonly known as: Diflucan Take 1 tablet (100 mg total) by mouth daily.   folic acid 1 MG tablet Commonly known as: FOLVITE Take 1 mg by mouth daily.   glimepiride 4 MG tablet Commonly known as: AMARYL TAKE 1/2 TABLET BY MOUTH WITH DINNER   Jardiance 25 MG Tabs tablet Generic drug: empagliflozin TAKE 1 TABLET BY MOUTH DAILY   metFORMIN 1000 MG tablet Commonly known as: GLUCOPHAGE TAKE 1 TABLET BY MOUTH TWICE DAILY WITH MEAL   methotrexate (PF) 50 MG/2ML injection INJECT 1 ML ONCE A WEEK   OneTouch Delica Lancets Fine Misc Use as instructed by provider   OneTouch Verio test strip Generic drug: glucose blood USE AS INSTRUCTED   rosuvastatin 20 MG tablet Commonly known as: CRESTOR TAKE 1 TABLET(20 MG) BY MOUTH DAILY   tirzepatide 5 MG/0.5ML Pen Commonly known as: MOUNJARO Inject 5 mg into the skin once a week. 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 His smokeless tobacco use includes chew. He reports that he does not drink alcohol and does not use drugs.   Review of Systems   Lipid history: Followed by PCP, has adequate levels on Crestor 20 mg daily    Lab Results  Component Value Date   CHOL 109 03/10/2021   HDL 38.90 (L) 03/10/2021   LDLCALC 45 03/10/2021   LDLDIRECT 154.1 02/16/2010   TRIG 124.0 03/10/2021   CHOLHDL 3 03/10/2021           Blood pressure history:  Has not been treated for hypertension, also not monitoring at home  BP Readings from Last 3 Encounters:  04/20/21 118/82  03/22/21 133/84  03/10/21 132/76    Most recent eye exam was in 2020, negative for retinopathy   Most recent foot exam: 5/21  Currently known complications of diabetes: none  He is on methotrexate injections for his rheumatoid arthritis  .  B12 level is low, this was done by PCP for screening   LABS:  No visits with results within 1 Week(s) from this visit.  Latest known visit with results is:  Lab on 04/09/2021  Component Date Value Ref Range Status   Microalb, Ur 04/09/2021 <0.7  0.0 - 1.9 mg/dL Final   Creatinine,U 04/09/2021 28.0  mg/dL Final   Microalb Creat Ratio 04/09/2021 2.5  0.0 - 30.0 mg/g Final   Sodium 04/09/2021 141  135 - 145 mEq/L Final   Potassium 04/09/2021 4.7  3.5 - 5.1 mEq/L Final   Chloride 04/09/2021 100  96 - 112 mEq/L Final   CO2 04/09/2021 32  19 - 32 mEq/L Final   Glucose, Bld 04/09/2021 184 (H)  70 - 99 mg/dL Final   BUN 04/09/2021 21  6 - 23 mg/dL Final   Creatinine, Ser 04/09/2021 0.85  0.40 - 1.50 mg/dL Final   Total Bilirubin 04/09/2021 0.7  0.2 - 1.2 mg/dL Final   Alkaline Phosphatase 04/09/2021 45  39 - 117 U/L Final   AST 04/09/2021 29  0 - 37 U/L Final   ALT 04/09/2021 51  0 - 53 U/L Final   Total Protein 04/09/2021 6.7  6.0 - 8.3  g/dL Final   Albumin 04/09/2021 4.8  3.5 - 5.2 g/dL Final   GFR 04/09/2021 89.57  >60.00 mL/min Final   Calculated using the CKD-EPI Creatinine Equation (2021)   Calcium 04/09/2021 10.3  8.4 - 10.5 mg/dL Final   Hgb A1c MFr Bld 04/09/2021 7.7 (H)  4.6 - 6.5 % Final   Glycemic Control Guidelines for People with Diabetes:Non Diabetic:  <6%Goal of Therapy: <7%Additional Action Suggested:  >8%     Physical Examination:  BP 118/82   Pulse 87   Ht 5\' 10"  (1.778 m)   Wt 201 lb 9.6 oz (91.4 kg)   SpO2 97%   BMI 28.93 kg/m    ASSESSMENT:  Diabetes type 2 non-insulin-dependent  See history of present illness for detailed discussion of current diabetes management, blood sugar patterns and problems identified  His A1c is 7.7  He has been managed with Jardiance 25 mg,, Amaryl 2 mg, Trulicity 3 mg and Metformin  Fasting blood sugars are overall higher than before and averaging 170 However he does have some fluctuation with blood sugars as low as 119 fasting Discussed that this may be related to partly his meal compliance with carbohydrates or other intake  and needs to check readings after meals to help manage his diabetes better   Renal function stable with continuing Jardiance 25 mg daily  B12 deficiency: He has been taking his supplement  High normal blood pressure: This was better on second measurement but will continue to follow, currently microalbumin normal   PLAN:    As above He will start checking blood sugars at bedtime or about 2 hours after any meal Also he can significantly improve his insulin resistance with exercise   Although he has done reasonably well with Trulicity and may do better with Mounjaro long-term especially with higher doses He is agreeable to trying this in place of Trulicity when he finishes current supply He will start with 5 mg weekly and then call for prescription of 7.5 Explained how this works and the injection device being similar  Follow-up  in 4 months    Patient Instructions  Check blood sugars on waking up 3 days a week  Also check blood sugars about 2 hours after meals and do this after different meals by rotation  Recommended blood sugar levels on waking up are 90-130 and about 2 hours after meal is 130-180  Please bring your blood sugar monitor to each visit, thank you  Walking daily  Mounjaro 5mg  weekly when Trulicity out for 4 shots then call for 7.5 mg Rx         Elayne Snare 04/20/2021, 3:04 PM   Note: This office note was prepared with Dragon voice recognition system technology. Any transcriptional errors that result from this process are unintentional.

## 2021-04-20 ENCOUNTER — Other Ambulatory Visit: Payer: Self-pay

## 2021-04-20 ENCOUNTER — Ambulatory Visit (INDEPENDENT_AMBULATORY_CARE_PROVIDER_SITE_OTHER): Payer: Medicare Other | Admitting: Endocrinology

## 2021-04-20 ENCOUNTER — Encounter: Payer: Self-pay | Admitting: Endocrinology

## 2021-04-20 VITALS — BP 118/82 | HR 87 | Ht 70.0 in | Wt 201.6 lb

## 2021-04-20 DIAGNOSIS — E7800 Pure hypercholesterolemia, unspecified: Secondary | ICD-10-CM

## 2021-04-20 DIAGNOSIS — E1165 Type 2 diabetes mellitus with hyperglycemia: Secondary | ICD-10-CM

## 2021-04-20 DIAGNOSIS — R030 Elevated blood-pressure reading, without diagnosis of hypertension: Secondary | ICD-10-CM | POA: Diagnosis not present

## 2021-04-20 MED ORDER — TIRZEPATIDE 5 MG/0.5ML ~~LOC~~ SOAJ: 5.0000 mg | SUBCUTANEOUS | 0 refills | Status: DC

## 2021-04-20 NOTE — Patient Instructions (Addendum)
Check blood sugars on waking up 3 days a week  Also check blood sugars about 2 hours after meals and do this after different meals by rotation  Recommended blood sugar levels on waking up are 90-130 and about 2 hours after meal is 130-180  Please bring your blood sugar monitor to each visit, thank you  Walking daily  Mounjaro 5mg  weekly when Trulicity out for 4 shots then call for 7.5 mg Rx

## 2021-04-22 ENCOUNTER — Other Ambulatory Visit: Payer: Self-pay | Admitting: Endocrinology

## 2021-04-26 ENCOUNTER — Other Ambulatory Visit: Payer: Self-pay | Admitting: Endocrinology

## 2021-04-26 ENCOUNTER — Telehealth: Payer: Self-pay

## 2021-04-26 NOTE — Telephone Encounter (Signed)
Pt called an informed us carotid looks good and shows minimal narrowing, nothing to explain symptoms

## 2021-04-26 NOTE — Telephone Encounter (Signed)
-----   Message from Alda Berthold, DO sent at 04/19/2021  1:26 PM EST ----- Please let pt know US carotid looks good and shows minimal narrowing, nothing to explain symptoms.

## 2021-04-30 ENCOUNTER — Other Ambulatory Visit: Payer: Self-pay | Admitting: Endocrinology

## 2021-05-02 ENCOUNTER — Other Ambulatory Visit: Payer: Self-pay | Admitting: Endocrinology

## 2021-05-03 ENCOUNTER — Encounter: Payer: Self-pay | Admitting: Internal Medicine

## 2021-05-10 ENCOUNTER — Telehealth: Payer: Self-pay | Admitting: Family Medicine

## 2021-05-10 NOTE — Telephone Encounter (Signed)
Patient calling in with respiratory symptoms: Shortness of breath, chest pain, palpitations or other red words send to Triage  Does the patient have a fever over 100, cough, congestion, sore throat, runny nose, lost of taste/smell (please list symptoms that patient has)? Nasal and chest congestion x 2 day  What date did symptoms start?05-09-2021 (If over 5 days ago, pt may be scheduled for in person visit)  Have you tested for Covid in the last 5 days? No   If yes, was it positive []  OR negative [] ? If positive in the last 5 days, please schedule virtual visit now. If negative, schedule for an in person OV with the next available provider if PCP has no openings. Please also let patient know they will be tested again (follow the script below)  "you will have to arrive 65mins prior to your appt time to be Covid tested. Please park in back of office at the [facility] & call [phone] to let the staff know you have arrived. A staff member will meet you at your car to do a rapid covid test. Once the test has resulted you will be notified by phone of your results to determine if appt will remain an in person visit or be converted to a virtual/phone visit. If you arrive less than 75mins before your appt time, your visit will be automatically converted to virtual & any recommended testing will happen AFTER the visit." Pt has virtual with dr Maudie Mercury on 05-11-2021 at 7 pm  THINGS TO REMEMBER  If no availability for virtual visit in office,  please schedule another [place] office  If no availability at another [facility] office, please instruct patient that they can schedule an evisit or virtual visit through their mychart account. Visits up to 8pm  patients can be seen in office 5 days after positive COVID test

## 2021-05-11 ENCOUNTER — Encounter: Payer: Self-pay | Admitting: Family Medicine

## 2021-05-11 ENCOUNTER — Telehealth (INDEPENDENT_AMBULATORY_CARE_PROVIDER_SITE_OTHER): Payer: Medicare Other | Admitting: Family Medicine

## 2021-05-11 VITALS — Wt 205.0 lb

## 2021-05-11 DIAGNOSIS — U071 COVID-19: Secondary | ICD-10-CM | POA: Diagnosis not present

## 2021-05-11 MED ORDER — PROMETHAZINE-DM 6.25-15 MG/5ML PO SYRP: 5.0000 mL | ORAL_SOLUTION | Freq: Four times a day (QID) | ORAL | 0 refills | Status: DC | PRN

## 2021-05-11 NOTE — Patient Instructions (Signed)
°  HOME CARE TIPS:  -COVID19 testing information: [email or web address].[email or web address]  Most pharmacies also offer testing and home test kits. If the Covid19 test is positive and you desire antiviral treatment, please contact a [facility] or schedule a follow up virtual visit through your primary care office or through the [facility].  Other test to treat options: ConnectRV.is?[email or web address]  -I sent the medication(s) we discussed to your pharmacy: Meds ordered this encounter  Medications   promethazine-dextromethorphan (PROMETHAZINE-DM) 6.25-15 MG/5ML syrup    Sig: Take 5 mLs by mouth 4 (four) times daily as needed for cough.    Dispense:  118 mL    Refill:  0     -can use tylenol  if needed for fevers, aches and pains per instructions  -can use nasal saline a few times per day if you have nasal congestion  -stay hydrated, drink plenty of fluids and eat small healthy meals - avoid dairy  -can take 1000 IU (52mcg) Vit D3 and 100-500 mg of Vit C daily per instructions  -If the Covid test is positive, check out the [facility] website for more information on home care, transmission and treatment for COVID19  -follow up with your doctor in 2-3 days unless improving and feeling better  -stay home while sick, except to seek medical care. If you have COVID19, you will likely be contagious for 7-10 days. Flu or Influenza is likely contagious for about 7 days. Other respiratory viral infections remain contagious for 5-10+ days depending on the virus and many other factors. Wear a good mask that fits snugly (such as N95 or KN95) if around others to reduce the risk of transmission.  It was nice to meet you today, and I really hope you are feeling better soon. I help [facility] out with telemedicine visits on Tuesdays and Thursdays and am happy to help if you need a follow up virtual visit on those days. Otherwise, if you have any concerns or questions  following this visit please schedule a follow up visit with your Primary Care doctor or seek care at a local urgent care clinic to avoid delays in care.    Seek in person care or schedule a follow up video visit promptly if your symptoms worsen, new concerns arise or you are not improving with treatment. Call 911 and/or seek emergency care if your symptoms are severe or life threatening.

## 2021-05-11 NOTE — Progress Notes (Signed)
Virtual Visit via Video Note  I connected with Raymond Vasquez  on 05/11/21 at 12:40 PM EST by a video enabled telemedicine application and verified that I am speaking with the correct person using two identifiers.  Location patient: home, [place] Location provider:work or home office Persons participating in the virtual visit: patient, provider  I discussed the limitations of evaluation and management by telemedicine and the availability of in person appointments. The patient expressed understanding and agreed to proceed.   HPI:  Acute telemedicine visit for sore throat and congestion: -Onset: 2-3 days ago -negative covid test today -Symptoms include: sore throat, cough, nasal congestion, mild body aches -Denies: fevers, NVD, CP, SOB -Has tried: drinking fluids, musinex sinus max - helping some -Pertinent past medical history: see below -Pertinent medication allergies: No Known Allergies -COVID-19 vaccine status:  Immunization History  Administered Date(s) Administered   Fluad Quad(high Dose 65+) 04/03/2019, 01/31/2020, 03/10/2021   Influenza Whole 02/20/2009, 02/21/2011   Influenza, High Dose Seasonal PF 06/05/2018   Influenza-Unspecified 01/22/2016, 02/20/2017   PFIZER(Purple Top)SARS-COV-2 Vaccination 07/30/2019, 08/30/2019   PNEUMOCOCCAL CONJUGATE-20 03/10/2021   Pneumococcal Conjugate-13 08/14/2013, 03/21/2014   Pneumococcal Polysaccharide-23 11/20/2014   Tdap 07/14/2011   Zoster Recombinat (Shingrix) 03/06/2019   Zoster, Live 08/14/2013     ROS: See pertinent positives and negatives per HPI.  Past Medical History:  Diagnosis Date   Angioedema    he is unsure of this or cause   Balanitis 04/12/2013   Diabetes mellitus type II, controlled, with no complications ([place])    Hemorrhoids    History of laparoscopic adjustable gastric banding, 08/10/2009 07/05/2013   Hyperlipidemia    Hypertension    Nephrolithiasis    1980s   OSA (obstructive sleep apnea)    PMR (polymyalgia  rheumatica) ([place])    Shingles    2014    Past Surgical History:  Procedure Laterality Date   APPENDECTOMY     FINGER SURGERY  06/2012   LAPAROSCOPIC GASTRIC BANDING  08/10/2009   ROTATOR CUFF REPAIR Right 06/2019   Dr Noemi Chapel     Current Outpatient Medications:    diclofenac (VOLTAREN) 75 MG EC tablet, Take 75 mg by mouth 2 (two) times daily. , Disp: , Rfl:    fluconazole (DIFLUCAN) 100 MG tablet, Take 1 tablet (100 mg total) by mouth daily., Disp: 7 tablet, Rfl: 0   folic acid (FOLVITE) 1 MG tablet, Take 1 mg by mouth daily., Disp: , Rfl:    glimepiride (AMARYL) 4 MG tablet, TAKE 1/2 TABLET BY MOUTH WITH DINNER, Disp: 90 tablet, Rfl: 0   glucose blood (ONETOUCH VERIO) test strip, CHECK BLOOD SUGAR ONCE A DAY ALTERNATING FASTING AND 2 HOURS AFTER A MEAL, Disp: 50 strip, Rfl: 2   JARDIANCE 25 MG TABS tablet, TAKE 1 TABLET BY MOUTH DAILY, Disp: 30 tablet, Rfl: 2   metFORMIN (GLUCOPHAGE) 1000 MG tablet, TAKE 1 TABLET BY MOUTH TWICE DAILY WITH MEAL, Disp: 180 tablet, Rfl: 1   Methotrexate Sodium (METHOTREXATE, PF,) 50 MG/2ML injection, INJECT 1 ML ONCE A WEEK, Disp: , Rfl:    ONETOUCH DELICA LANCETS FINE MISC, Use as instructed by provider, Disp: 200 each, Rfl: 3   promethazine-dextromethorphan (PROMETHAZINE-DM) 6.25-15 MG/5ML syrup, Take 5 mLs by mouth 4 (four) times daily as needed for cough., Disp: 118 mL, Rfl: 0   rosuvastatin (CRESTOR) 20 MG tablet, TAKE 1 TABLET(20 MG) BY MOUTH DAILY, Disp: 90 tablet, Rfl: 1   tirzepatide (MOUNJARO) 5 MG/0.5ML Pen, Inject 5 mg into the skin once a  week., Disp: 2 mL, Rfl: 0   TRULICITY 3 KK/4.4CX SOPN, INJECT 1 PEN UNDER THE SKIN ONCE WEEKLY, Disp: 2 mL, Rfl: 0  Current Facility-Administered Medications:    0.9 %  sodium chloride infusion, 500 mL, Intravenous, Once, Pyrtle, Raymond Lines, MD   0.9 %  sodium chloride infusion, 500 mL, Intravenous, Once, Pyrtle, Raymond Lines, MD  EXAMTonette Vasquez per patient if applicable:  GENERAL: alert, oriented, appears well and  in no acute distress  HEENT: atraumatic, conjunttiva clear, no obvious abnormalities on inspection of external nose and ears  NECK: normal movements of the head and neck  LUNGS: on inspection no signs of respiratory distress, breathing rate appears normal, no obvious gross SOB, gasping or wheezing  CV: no obvious cyanosis  MS: moves all visible extremities without noticeable abnormality  PSYCH/NEURO: pleasant and cooperative, no obvious depression or anxiety, speech and thought processing grossly intact  ASSESSMENT AND PLAN:  Discussed the following assessment and plan:  COVID-19  -we discussed possible serious and likely etiologies, options for evaluation and workup, limitations of telemedicine visit vs in person visit, treatment, treatment risks and precautions. Pt is agreeable to treatment via telemedicine at this moment. Query viral URI, possible influenza, covid vs other. He opted against tamiflu given timing and doing ok. He agrees to retest for covid and if positive contact [facility] pharmacy if he wishes to do antiviral. Sent cough Rx and symptomatic care summarized in patient instructions.  Advised to seek prompt virtual visit follow up or in person care if worsening, new symptoms arise, or if is not improving with treatment.  I discussed the assessment and treatment plan with the patient. The patient was provided an opportunity to ask questions and all were answered. The patient agreed with the plan and demonstrated an understanding of the instructions.     Lucretia Kern, DO

## 2021-05-19 ENCOUNTER — Other Ambulatory Visit: Payer: Self-pay | Admitting: Endocrinology

## 2021-06-05 ENCOUNTER — Other Ambulatory Visit: Payer: Self-pay | Admitting: Endocrinology

## 2021-06-07 ENCOUNTER — Ambulatory Visit: Payer: Medicare Other | Admitting: Family Medicine

## 2021-06-18 ENCOUNTER — Ambulatory Visit (AMBULATORY_SURGERY_CENTER): Payer: Medicare Other

## 2021-06-18 ENCOUNTER — Other Ambulatory Visit: Payer: Self-pay

## 2021-06-18 VITALS — Ht 70.0 in | Wt 200.0 lb

## 2021-06-18 DIAGNOSIS — Z86010 Personal history of colonic polyps: Secondary | ICD-10-CM

## 2021-06-18 MED ORDER — PEG 3350-KCL-NA BICARB-NACL 420 G PO SOLR: 4000.0000 mL | Freq: Once | ORAL | 0 refills | Status: AC

## 2021-06-18 NOTE — Progress Notes (Signed)
No egg or soy allergy known to patient  No issues known to pt with past sedation with any surgeries or procedures Patient denies ever being told they had issues or difficulty with intubation  No FH of Malignant Hyperthermia Pt is not on diet pills Pt is not on home 02  Pt is not on blood thinners  Pt denies issues with constipation;  No A fib or A flutter Pt is fully vaccinated for Covid x 2; NO PA's for preps discussed with pt in PV today  Discussed with pt there will be an out-of-pocket cost for prep and that varies from $0 to 70 + dollars - pt verbalized understanding  Due to the COVID-19 pandemic we are asking patients to follow certain guidelines in PV and the [place]   Pt aware of COVID protocols and [facility] guidelines  PV completed over the phone. Pt verified name, DOB, address and insurance during PV today.  Pt mailed instruction packet with copy of consent form to read and not return, and instructions.  Pt encouraged to call with questions or issues.  If pt has My chart, procedure instructions sent via My Chart   Patient reports last "seizure-episode" was in 03/2021- was not told to follow up with neurology- "my brain scans, MRI, and other test came out negative- has not had an episode since then

## 2021-06-28 ENCOUNTER — Other Ambulatory Visit: Payer: Medicare Other

## 2021-06-29 ENCOUNTER — Encounter: Payer: Self-pay | Admitting: Internal Medicine

## 2021-06-30 ENCOUNTER — Other Ambulatory Visit: Payer: Self-pay | Admitting: Endocrinology

## 2021-07-01 ENCOUNTER — Other Ambulatory Visit: Payer: Self-pay

## 2021-07-01 ENCOUNTER — Telehealth: Payer: Self-pay | Admitting: Internal Medicine

## 2021-07-01 ENCOUNTER — Other Ambulatory Visit (INDEPENDENT_AMBULATORY_CARE_PROVIDER_SITE_OTHER): Payer: Medicare Other

## 2021-07-01 DIAGNOSIS — E1165 Type 2 diabetes mellitus with hyperglycemia: Secondary | ICD-10-CM

## 2021-07-01 LAB — BASIC METABOLIC PANEL
BUN: 10 mg/dL (ref 6–23)
CO2: 33 mEq/L — ABNORMAL HIGH (ref 19–32)
Calcium: 9.8 mg/dL (ref 8.4–10.5)
Chloride: 105 mEq/L (ref 96–112)
Creatinine, Ser: 0.76 mg/dL (ref 0.40–1.50)
GFR: 92.5 mL/min (ref >60.00)
Glucose, Bld: 119 mg/dL — ABNORMAL HIGH (ref 70–99)
Potassium: 4.4 mEq/L (ref 3.5–5.1)
Sodium: 144 mEq/L (ref 135–145)

## 2021-07-01 LAB — HEMOGLOBIN A1C: Hgb A1c MFr Bld: 6.9 % — ABNORMAL HIGH (ref 4.6–6.5)

## 2021-07-01 NOTE — Telephone Encounter (Signed)
Good Morning Dr. Hilarie Fredrickson,    Patient called and stated that he was a day behind on his prep medication and that he wanted to reschedule his colonoscopy with you for tomorrow at 9:30.   Patient was rescheduled for 4/17 at 9:30am.

## 2021-07-02 ENCOUNTER — Encounter: Payer: Medicare Other | Admitting: Internal Medicine

## 2021-07-03 ENCOUNTER — Other Ambulatory Visit: Payer: Self-pay | Admitting: Endocrinology

## 2021-07-06 ENCOUNTER — Ambulatory Visit: Payer: Medicare Other | Admitting: Endocrinology

## 2021-07-07 ENCOUNTER — Telehealth: Payer: Self-pay | Admitting: Family Medicine

## 2021-07-07 NOTE — Telephone Encounter (Signed)
Spoke with the patient and informed him an appt is needed for evaluation prior to medications being sent in.  Patient complained of a penile rash, states he often gets this and questioned if this was due to taking Jardiance after reading side effects.  Appt scheduled with Dr Volanda Napoleon on 2/16 to arrive at 2:45pm.

## 2021-07-07 NOTE — Telephone Encounter (Signed)
Patient called in requesting a medication refill for a yeast infection. Patient stated that he usually get these a couple times out of the year due to his JARDIANCE 25 MG TABS tablet [[id number]]  medication.  Patient is aware that he may need to come into be seen.  Patient could be contacted at [phone].  Please advise.

## 2021-07-08 ENCOUNTER — Ambulatory Visit (INDEPENDENT_AMBULATORY_CARE_PROVIDER_SITE_OTHER): Payer: Medicare Other | Admitting: Family Medicine

## 2021-07-08 ENCOUNTER — Encounter: Payer: Self-pay | Admitting: Endocrinology

## 2021-07-08 ENCOUNTER — Ambulatory Visit (INDEPENDENT_AMBULATORY_CARE_PROVIDER_SITE_OTHER): Payer: Medicare Other | Admitting: Endocrinology

## 2021-07-08 ENCOUNTER — Encounter: Payer: Self-pay | Admitting: Family Medicine

## 2021-07-08 ENCOUNTER — Other Ambulatory Visit: Payer: Self-pay

## 2021-07-08 VITALS — BP 124/82 | HR 66

## 2021-07-08 VITALS — BP 120/70 | HR 79 | Temp 97.9°F | Wt 194.8 lb

## 2021-07-08 DIAGNOSIS — E538 Deficiency of other specified B group vitamins: Secondary | ICD-10-CM | POA: Diagnosis not present

## 2021-07-08 DIAGNOSIS — E1165 Type 2 diabetes mellitus with hyperglycemia: Secondary | ICD-10-CM | POA: Diagnosis not present

## 2021-07-08 DIAGNOSIS — N481 Balanitis: Secondary | ICD-10-CM

## 2021-07-08 DIAGNOSIS — B3742 Candidal balanitis: Secondary | ICD-10-CM

## 2021-07-08 DIAGNOSIS — E1140 Type 2 diabetes mellitus with diabetic neuropathy, unspecified: Secondary | ICD-10-CM

## 2021-07-08 MED ORDER — FLUCONAZOLE 150 MG PO TABS: 150.0000 mg | ORAL_TABLET | Freq: Once | ORAL | 0 refills | Status: DC

## 2021-07-08 MED ORDER — FLUCONAZOLE 150 MG PO TABS
150.0000 mg | ORAL_TABLET | Freq: Once | ORAL | 0 refills | Status: AC
Start: 2021-07-08 — End: 2021-07-08

## 2021-07-08 MED ORDER — NYSTATIN 100000 UNIT/GM EX CREA: 1.0000 "application " | TOPICAL_CREAM | Freq: Two times a day (BID) | CUTANEOUS | 0 refills | Status: DC

## 2021-07-08 MED ORDER — NYSTATIN 100000 UNIT/GM EX CREA: 1.0000 "application " | TOPICAL_CREAM | Freq: Two times a day (BID) | CUTANEOUS | 0 refills | Status: AC

## 2021-07-08 NOTE — Patient Instructions (Signed)
Start exercise!  Check blood sugars on waking up 3 days a week  Also check blood sugars about 2 hours after meals and do this after different meals by rotation  Recommended blood sugar levels on waking up are 90-130 and about 2 hours after meal is 130-160  Please bring your blood sugar monitor to each visit, thank you

## 2021-07-08 NOTE — Progress Notes (Signed)
Subjective:    Patient ID: Raymond Vasquez, male    DOB: [birth year], 71 y.o.   MRN: [id number]  Chief Complaint  Patient presents with   Rash    Yeast infection, x 4 days,     HPI Patient was seen today for acute concern.  Seen this morning by endocrinology for same issue, however advised to discuss with PCP.  Patient endorses yeast infection of penis x4 days.  Endorses pain, edema, cracking of foreskin.  States had similar infection last year.  May occur 2 times per year.  Patient taking Jardiance, glimepiride, metformin for DM.  Also on Mounjaro.  States blood sugar has been well controlled.  Patient also mentions recent trigger finger surgery right hand.  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 07/05/2013   Hyperlipidemia    on meds   Hypertension    Nephrolithiasis    1980s   OSA (obstructive sleep apnea)    PMR (polymyalgia rheumatica) (HCC)    Seizures ([place])    03/2021   Shingles    2014    No Known Allergies  ROS General: Denies fever, chills, night sweats, changes in weight, changes in appetite HEENT: Denies headaches, ear pain, changes in vision, rhinorrhea, sore throat CV: Denies CP, palpitations, SOB, orthopnea Pulm: Denies SOB, cough, wheezing GI: Denies abdominal pain, nausea, vomiting, diarrhea, constipation GU: Denies dysuria, hematuria, frequency, vaginal discharge Msk: Denies muscle cramps, joint pains Neuro: Denies weakness, numbness, tingling Skin: Denies rashes, bruising + infection of skin of penis, healing area in palm of R hand s/p trigger finger surgery Psych: Denies depression, anxiety, hallucinations     Objective:    Blood pressure 120/70, pulse 79, temperature 97.9 F (36.6 C),  temperature source Oral, weight 194 lb 12.8 oz (88.4 kg), SpO2 98 %.   Gen. Pleasant, well-nourished, in no distress, normal affect   HEENT: [place]/AT, face symmetric, conjunctiva clear, no scleral icterus, PERRLA, EOMI, nares patent without drainage Lungs: no accessory muscle use Cardiovascular: RRR, no peripheral edema GU: Normal external male genitalia, uncircumcised with erythema and fissures in foreskin.  Unable to retract foreskin to visualize glans penis 2/2 edema and pt discomfort. Musculoskeletal: No deformities, no cyanosis or clubbing, normal tone Neuro:  A&Ox3, CN II-XII intact, normal gait Skin:  Warm, no lesions/ rash.  On the right hand with healing surgical incision, peeling dry skin.   Wt Readings from Last 3 Encounters:  07/08/21 194 lb 12.8 oz (88.4 kg)  07/08/21 (P) 193 lb 6.4 oz (87.7 kg)  06/18/21 200 lb (90.7 kg)    Lab Results  Component Value Date   WBC 5.6 03/10/2021   HGB 15.2 03/10/2021   HCT 48.3 03/10/2021   PLT 251.0 03/10/2021   GLUCOSE 119 (H) 07/01/2021   CHOL 109 03/10/2021   TRIG 124.0 03/10/2021   HDL 38.90 (L) 03/10/2021   LDLDIRECT 154.1 02/16/2010   LDLCALC 45 03/10/2021   ALT 51 04/09/2021   AST 29 04/09/2021   NA 144 07/01/2021   K 4.4 07/01/2021   CL 105 07/01/2021   CREATININE 0.76 07/01/2021   BUN 10 07/01/2021   CO2 33 (H) 07/01/2021   TSH 1.38 03/10/2021  PSA 1.35 07/24/2018   HGBA1C 6.9 (H) 07/01/2021   MICROALBUR <0.7 04/09/2021    Assessment/Plan:  Candidal balanitis -New issue -Likely 2/2 use of Jardiance 25 mg daily -Discussed keeping skin clean and dry -Advised to have a discussion with endocrinology regarding recurrent candidal infections - Plan: fluconazole (DIFLUCAN) 150 MG tablet, nystatin cream (MYCOSTATIN)  Type 2 diabetes mellitus with diabetic neuropathy, without long-term current use of insulin (HCC) -Controlled -Hemoglobin A1c 6.9% on 07/01/2021 -Continue current medications -Patient advised to discuss  with endocrinology recurrent candidal skin infections   F/u with PCP as needed  Grier Mitts, MD

## 2021-07-08 NOTE — Progress Notes (Signed)
Patient ID: Raymond Vasquez, male   DOB: 1950-07-26, 71 y.o.   MRN: [id number]          Reason for Appointment: Follow-up for Type 2 Diabetes    History of Present Illness:          Date of diagnosis of type 2 diabetes mellitus: 2003 approximately       Recent history:    Non-insulin hypoglycemic drugs : Metformin 1 g a.m., p.m., Amaryl 2 mg p.m., Mounjaro 5  mg weekly, Jardiance 25 mg daily  Current management, blood sugar patterns and problems identified: He has improved his A1c down to 6.9 compared to 7.7  He did start Mounjaro which was prescribed in November on his last visit and is now on his third third prescription of the 5 mg dosage replacing 3 mg Trulicity  No nausea He thinks his portion control is much better with increased satiety  Blood sugars were also more inconsistent on the last visit and mostly higher in the morning  He has only 1 reading after dinner recently but he does not think there is any high blood sugar reading at night usually  He does not have any motivation to exercise again Weight is down 7 pounds       Side effects from medications have been: None    Typical meal intake: Breakfast is usually skipped, sometimes juice and muffin.  Using smaller portions of meats now            Glucose monitoring:  done 1 times a day         Glucometer: One Touch Verio.       Blood Glucose readings from monitor download:   PRE-MEAL Fasting Lunch Dinner Bedtime Overall  Glucose range: 101-165   150   Mean/median: 120    122   Previously:  PRE-MEAL Fasting Lunch Dinner Bedtime Overall  Glucose range: 119-296      Mean/median:     171   POST-MEAL PC Breakfast PC Lunch PC Dinner  Glucose range:   ?  Mean/median:       Dietician visit, most recent: Before his lap band surgery  Weight history: Maximum 280 in the past  Wt Readings from Last 3 Encounters:  07/08/21 (P) 193 lb 6.4 oz (87.7 kg)  06/18/21 200 lb (90.7 kg)  05/11/21 205 lb (93 kg)     Glycemic control:   Lab Results  Component Value Date   HGBA1C 6.9 (H) 07/01/2021   HGBA1C 7.7 (H) 04/09/2021   HGBA1C 7.4 (H) 03/10/2021   Lab Results  Component Value Date   MICROALBUR <0.7 04/09/2021   LDLCALC 45 03/10/2021   CREATININE 0.76 07/01/2021   Lab Results  Component Value Date   MICRALBCREAT 2.5 04/09/2021    Lab Results  Component Value Date   FRUCTOSAMINE 263 03/07/2019   FRUCTOSAMINE 281 03/22/2018    No visits with results within 1 Week(s) from this visit.  Latest known visit with results is:  Lab on 07/01/2021  Component Date Value Ref Range Status   Sodium 07/01/2021 144  135 - 145 mEq/L Final   Potassium 07/01/2021 4.4  3.5 - 5.1 mEq/L Final   Chloride 07/01/2021 105  96 - 112 mEq/L Final   CO2 07/01/2021 33 (H)  19 - 32 mEq/L Final   Glucose, Bld 07/01/2021 119 (H)  70 - 99 mg/dL Final   BUN 07/01/2021 10  6 - 23 mg/dL Final   Creatinine, Ser 07/01/2021 0.76  0.40 - 1.50 mg/dL Final   GFR 07/01/2021 92.50  >60.00 mL/min Final   Calculated using the CKD-EPI Creatinine Equation (2021)   Calcium 07/01/2021 9.8  8.4 - 10.5 mg/dL Final   Hgb A1c MFr Bld 07/01/2021 6.9 (H)  4.6 - 6.5 % Final   Glycemic Control Guidelines for People with Diabetes:Non Diabetic:  <6%Goal of Therapy: <7%Additional Action Suggested:  >8%     Allergies as of 07/08/2021   No Known Allergies      Medication List        Accurate as of July 08, 2021  1:41 PM. If you have any questions, ask your nurse or doctor.          diclofenac 75 MG EC tablet Commonly known as: VOLTAREN Take 75 mg by mouth 2 (two) times daily.   folic acid 1 MG tablet Commonly known as: FOLVITE Take 1 mg by mouth daily.   glimepiride 4 MG tablet Commonly known as: AMARYL TAKE 1/2 TABLET BY MOUTH WITH DINNER   Jardiance 25 MG Tabs tablet Generic drug: empagliflozin TAKE 1 TABLET BY MOUTH DAILY   metFORMIN 1000 MG tablet Commonly known as: GLUCOPHAGE TAKE 1 TABLET BY MOUTH  TWICE DAILY WITH MEAL   methotrexate (PF) 50 MG/2ML injection INJECT 1 ML ONCE A WEEK   Mounjaro 5 MG/0.5ML Pen Generic drug: tirzepatide ADMINISTER 0.5 ML UNDER THE SKIN 1 TIME WEEKLY   OneTouch Delica Lancets Fine Misc Use as instructed by provider   glucose blood test strip OneTouch Verio test strips  USE TO CHECK BLOOD SUGAR ONCE DAILY ALTERNATING FASTING AND 2 HOURS AFTER A MEAL   OneTouch Verio test strip Generic drug: glucose blood CHECK BLOOD SUGAR ONCE A DAY ALTERNATING FASTING AND 2 HOURS AFTER A MEAL   rosuvastatin 20 MG tablet Commonly known as: CRESTOR TAKE 1 TABLET(20 MG) BY MOUTH DAILY   VITAMIN B 12 PO Take 1 tablet by mouth daily at 6 (six) AM.        Allergies: No Known Allergies  Past Medical History:  Diagnosis Date   Angioedema    he is unsure of this or cause   Arthritis    RIGHT shoulder   Balanitis 04/12/2013   Diabetes mellitus type II, controlled, with no complications ([place])    on meds   Hemorrhoids    History of laparoscopic adjustable gastric banding, 08/10/2009 07/05/2013   Hyperlipidemia    on meds   Hypertension    Nephrolithiasis    1980s   OSA (obstructive sleep apnea)    PMR (polymyalgia rheumatica) (HCC)    Seizures ([place])    03/2021   Shingles    2014    Past Surgical History:  Procedure Laterality Date   APPENDECTOMY     COLONOSCOPY  2019   JMP-MAC-2 day mira/sup(good with lavage)-polyps   FINGER SURGERY  06/2012   LAPAROSCOPIC GASTRIC BANDING  08/10/2009   POLYPECTOMY  2019   polyps   ROTATOR CUFF REPAIR Right 06/2019   Dr Noemi Chapel   TONSILLECTOMY  1959    Family History  Problem Relation Age of Onset   Heart disease Mother    Diabetes Maternal Grandmother    Colon polyps Neg Hx    Colon cancer Neg Hx    Esophageal cancer Neg Hx    Rectal cancer Neg Hx    Stomach cancer Neg Hx     Social History:  reports that he has been smoking cigars. His smokeless tobacco use includes chew. He  reports current  alcohol use. He reports that he does not use drugs.   Review of Systems   Lipid history: Followed by PCP, has adequate levels on Crestor 20 mg daily    Lab Results  Component Value Date   CHOL 109 03/10/2021   HDL 38.90 (L) 03/10/2021   LDLCALC 45 03/10/2021   LDLDIRECT 154.1 02/16/2010   TRIG 124.0 03/10/2021   CHOLHDL 3 03/10/2021           Blood pressure history:  Has not been treated for hypertension He does take Jardiance  BP Readings from Last 3 Encounters:  07/08/21 124/82  04/20/21 118/82  03/22/21 133/84    No reports of eye exams available, likely seeing optometrist, negative for retinopathy   Most recent foot exam: 2/23  Currently known complications of diabetes: none  He is on methotrexate injections for his rheumatoid arthritis  .  B12 level was improved after starting supplements  He is still taking this regularly  He has had another episode of balanitis, previously treated by his PCP with unknown cream and tablet, does not think this is difficult to deal with when he has treatment available     LABS:  No visits with results within 1 Week(s) from this visit.  Latest known visit with results is:  Lab on 07/01/2021  Component Date Value Ref Range Status   Sodium 07/01/2021 144  135 - 145 mEq/L Final   Potassium 07/01/2021 4.4  3.5 - 5.1 mEq/L Final   Chloride 07/01/2021 105  96 - 112 mEq/L Final   CO2 07/01/2021 33 (H)  19 - 32 mEq/L Final   Glucose, Bld 07/01/2021 119 (H)  70 - 99 mg/dL Final   BUN 07/01/2021 10  6 - 23 mg/dL Final   Creatinine, Ser 07/01/2021 0.76  0.40 - 1.50 mg/dL Final   GFR 07/01/2021 92.50  >60.00 mL/min Final   Calculated using the CKD-EPI Creatinine Equation (2021)   Calcium 07/01/2021 9.8  8.4 - 10.5 mg/dL Final   Hgb A1c MFr Bld 07/01/2021 6.9 (H)  4.6 - 6.5 % Final   Glycemic Control Guidelines for People with Diabetes:Non Diabetic:  <6%Goal of Therapy: <7%Additional Action Suggested:  >8%     Physical  Examination:  BP 124/82    Pulse 66    Ht (P) _0  (1.778 m)    Wt (P) 193 lb 6.4 oz (87.7 kg)    SpO2 97%    BMI (P) 27.75 kg/m   Diabetic Foot Exam - Simple   Simple Foot Form Diabetic Foot exam was performed with the following findings: Yes   Visual Inspection No deformities, no ulcerations, no other skin breakdown bilaterally: Yes Sensation Testing Intact to touch and monofilament testing bilaterally: Yes Pulse Check Posterior Tibialis and Dorsalis pulse intact bilaterally: Yes Comments      ASSESSMENT:  Diabetes type 2 non-insulin-dependent, BMI under 30  See history of present illness for detailed discussion of current diabetes management, blood sugar patterns and problems identified  His A1c is 6.9 compared to 7.7  Diabetes has been managed with Jardiance 25 mg,, Amaryl 2 mg, Mounjaro 5  mg and Metformin  His blood sugar control is significantly better with Mounjaro 5 mg only compared to 3 mg Trulicity weekly Overall blood sugars appear to be better and fasting readings are averaging about 120 and highest reading 165 However not exercising His weight loss is likely to be from better portion control Also not checking readings after meals  Balanitis from [place]: He is not having frequent episodes except for a few months in between  B12 supplements: He needs to continue these long-term    PLAN:    Continue 5 mg Mounjaro weekly Reminded him to check his blood sugars consistently after meals at least half the time Encouraged him to set up any exercise that he likes including walking, yard work or exercise bike and weights Discussed need for further weight loss We also consider reducing or stopping Amaryl if blood sugars are normal  He will discuss issues with balanitis with his PCP  Follow-up in 4 months unless blood sugars are not controlled  He will bring copies of his eye exam at upcoming visit for updating our records  There are no Patient  Instructions on file for this visit.        Elayne Snare 07/08/2021, 1:41 PM   Note: This office note was prepared with Dragon voice recognition system technology. Any transcriptional errors that result from this process are unintentional.

## 2021-07-09 ENCOUNTER — Other Ambulatory Visit: Payer: Self-pay | Admitting: Endocrinology

## 2021-07-09 ENCOUNTER — Encounter: Payer: Self-pay | Admitting: Family Medicine

## 2021-08-01 ENCOUNTER — Other Ambulatory Visit: Payer: Self-pay | Admitting: Endocrinology

## 2021-08-19 ENCOUNTER — Telehealth: Payer: Self-pay | Admitting: Internal Medicine

## 2021-08-19 NOTE — Telephone Encounter (Signed)
New prep instructions sent to [facility] and mailed to pt. ?

## 2021-08-19 NOTE — Telephone Encounter (Signed)
Inbound call from patient requested that he change is procedure to another day. Patient was moved to 5/17 and is wanting a new packet of instructions. Please advise.  ?

## 2021-08-27 ENCOUNTER — Other Ambulatory Visit: Payer: Self-pay | Admitting: Family Medicine

## 2021-09-01 ENCOUNTER — Other Ambulatory Visit: Payer: Self-pay | Admitting: Endocrinology

## 2021-09-06 ENCOUNTER — Other Ambulatory Visit: Payer: Self-pay

## 2021-09-06 ENCOUNTER — Encounter: Payer: Medicare Other | Admitting: Internal Medicine

## 2021-09-06 DIAGNOSIS — E1165 Type 2 diabetes mellitus with hyperglycemia: Secondary | ICD-10-CM

## 2021-09-06 MED ORDER — MOUNJARO 5 MG/0.5ML ~~LOC~~ SOAJ: SUBCUTANEOUS | 3 refills | Status: DC

## 2021-09-17 ENCOUNTER — Other Ambulatory Visit: Payer: Self-pay | Admitting: Endocrinology

## 2021-10-03 ENCOUNTER — Encounter: Payer: Self-pay | Admitting: Certified Registered Nurse Anesthetist

## 2021-10-05 ENCOUNTER — Encounter: Payer: Self-pay | Admitting: Internal Medicine

## 2021-10-06 ENCOUNTER — Encounter: Payer: Medicare Other | Admitting: Internal Medicine

## 2021-10-13 ENCOUNTER — Other Ambulatory Visit: Payer: Self-pay

## 2021-10-13 DIAGNOSIS — E1165 Type 2 diabetes mellitus with hyperglycemia: Secondary | ICD-10-CM

## 2021-10-13 MED ORDER — EMPAGLIFLOZIN 25 MG PO TABS: 25.0000 mg | ORAL_TABLET | Freq: Every day | ORAL | 2 refills | Status: DC

## 2021-10-25 ENCOUNTER — Other Ambulatory Visit: Payer: Self-pay | Admitting: Endocrinology

## 2021-11-02 ENCOUNTER — Other Ambulatory Visit (INDEPENDENT_AMBULATORY_CARE_PROVIDER_SITE_OTHER): Payer: Medicare Other

## 2021-11-02 DIAGNOSIS — E1165 Type 2 diabetes mellitus with hyperglycemia: Secondary | ICD-10-CM

## 2021-11-02 LAB — BASIC METABOLIC PANEL
BUN: 11 mg/dL (ref 6–23)
CO2: 31 mEq/L (ref 19–32)
Calcium: 10 mg/dL (ref 8.4–10.5)
Chloride: 106 mEq/L (ref 96–112)
Creatinine, Ser: 0.87 mg/dL (ref 0.40–1.50)
GFR: 88.59 mL/min (ref >60.00)
Glucose, Bld: 124 mg/dL — ABNORMAL HIGH (ref 70–99)
Potassium: 4.6 mEq/L (ref 3.5–5.1)
Sodium: 142 mEq/L (ref 135–145)

## 2021-11-02 LAB — HEMOGLOBIN A1C: Hgb A1c MFr Bld: 6.4 % (ref 4.6–6.5)

## 2021-11-08 ENCOUNTER — Ambulatory Visit (AMBULATORY_SURGERY_CENTER): Payer: Self-pay | Admitting: *Deleted

## 2021-11-08 VITALS — Ht 70.0 in | Wt 193.0 lb

## 2021-11-08 DIAGNOSIS — Z86010 Personal history of colonic polyps: Secondary | ICD-10-CM

## 2021-11-08 MED ORDER — PEG 3350-KCL-NA BICARB-NACL 420 G PO SOLR: 4000.0000 mL | Freq: Once | ORAL | 0 refills | Status: AC

## 2021-11-08 NOTE — Progress Notes (Signed)

## 2021-11-11 ENCOUNTER — Ambulatory Visit (INDEPENDENT_AMBULATORY_CARE_PROVIDER_SITE_OTHER): Payer: Medicare Other | Admitting: Endocrinology

## 2021-11-11 ENCOUNTER — Encounter: Payer: Self-pay | Admitting: Endocrinology

## 2021-11-11 VITALS — BP 124/88 | HR 78 | Ht 70.0 in | Wt 197.8 lb

## 2021-11-11 DIAGNOSIS — E669 Obesity, unspecified: Secondary | ICD-10-CM

## 2021-11-11 DIAGNOSIS — E1169 Type 2 diabetes mellitus with other specified complication: Secondary | ICD-10-CM

## 2021-11-11 NOTE — Patient Instructions (Signed)
Check blood sugars on waking up   Also check blood sugars about 2 hours after meals and do this after different meals by rotation  Recommended blood sugar levels on waking up are 90-130 and about 2 hours after meal is 130-160  Please bring your blood sugar monitor to each visit, thank you  

## 2021-12-01 ENCOUNTER — Encounter: Payer: Self-pay | Admitting: Internal Medicine

## 2021-12-06 ENCOUNTER — Ambulatory Visit (AMBULATORY_SURGERY_CENTER): Payer: Medicare Other | Admitting: Internal Medicine

## 2021-12-06 ENCOUNTER — Encounter: Payer: Self-pay | Admitting: Internal Medicine

## 2021-12-06 VITALS — BP 129/72 | HR 64 | Temp 98.6°F | Resp 16 | Ht 70.0 in | Wt 193.0 lb

## 2021-12-06 DIAGNOSIS — Z860100 Personal history of colon polyps, unspecified: Secondary | ICD-10-CM

## 2021-12-06 DIAGNOSIS — Z09 Encounter for follow-up examination after completed treatment for conditions other than malignant neoplasm: Secondary | ICD-10-CM | POA: Diagnosis not present

## 2021-12-06 DIAGNOSIS — D122 Benign neoplasm of ascending colon: Secondary | ICD-10-CM

## 2021-12-06 DIAGNOSIS — K635 Polyp of colon: Secondary | ICD-10-CM | POA: Diagnosis not present

## 2021-12-06 MED ORDER — SODIUM CHLORIDE 0.9 % IV SOLN: 500.0000 mL | Freq: Once | INTRAVENOUS | Status: DC

## 2021-12-06 NOTE — Patient Instructions (Signed)
Handouts Provided:  Polyps  YOU HAD AN ENDOSCOPIC PROCEDURE TODAY AT THE [facility]:   Refer to the procedure report that was given to you for any specific questions about what was found during the examination.  If the procedure report does not answer your questions, please call your gastroenterologist to clarify.  If you requested that your care partner not be given the details of your procedure findings, then the procedure report has been included in a sealed envelope for you to review at your convenience later.  YOU SHOULD EXPECT: Some feelings of bloating in the abdomen. Passage of more gas than usual.  Walking can help get rid of the air that was put into your GI tract during the procedure and reduce the bloating. If you had a lower endoscopy (such as a colonoscopy or flexible sigmoidoscopy) you may notice spotting of blood in your stool or on the toilet paper. If you underwent a bowel prep for your procedure, you may not have a normal bowel movement for a few days.  Please Note:  You might notice some irritation and congestion in your nose or some drainage.  This is from the oxygen used during your procedure.  There is no need for concern and it should clear up in a day or so.  SYMPTOMS TO REPORT IMMEDIATELY:  Following lower endoscopy (colonoscopy or flexible sigmoidoscopy):  Excessive amounts of blood in the stool  Significant tenderness or worsening of abdominal pains  Swelling of the abdomen that is new, acute  Fever of 100F or higher  For urgent or emergent issues, a gastroenterologist can be reached at any hour by calling [phone]. Do not use MyChart messaging for urgent concerns.    DIET:  We do recommend a small meal at first, but then you may proceed to your regular diet.  Drink plenty of fluids but you should avoid alcoholic beverages for 24 hours.  ACTIVITY:  You should plan to take it easy for the rest of today and you should NOT DRIVE or use heavy  machinery until tomorrow (because of the sedation medicines used during the test).    FOLLOW UP: Our staff will call the number listed on your records the next business day following your procedure.  We will call around 7:15- 8:00 am to check on you and address any questions or concerns that you may have regarding the information given to you following your procedure. If we do not reach you, we will leave a message.  If you develop any symptoms (ie: fever, flu-like symptoms, shortness of breath, cough etc.) before then, please call [phone].  If you test positive for Covid 19 in the 2 weeks post procedure, please call and report this information to us.    If any biopsies were taken you will be contacted by phone or by letter within the next 1-3 weeks.  Please call us at [phone] if you have not heard about the biopsies in 3 weeks.    SIGNATURES/CONFIDENTIALITY: You and/or your care partner have signed paperwork which will be entered into your electronic medical record.  These signatures attest to the fact that that the information above on your After Visit Summary has been reviewed and is understood.  Full responsibility of the confidentiality of this discharge information lies with you and/or your care-partner.  

## 2021-12-06 NOTE — Progress Notes (Signed)
GASTROENTEROLOGY PROCEDURE H&P NOTE   Primary Care Physician: Caren Macadam, MD (Inactive)    Reason for Procedure:  History of adenomatous colon polyps  Plan:    Surveillance colonoscopy  Patient is appropriate for endoscopic procedure(s) in the ambulatory ([place]) setting.  The nature of the procedure, as well as the risks, benefits, and alternatives were carefully and thoroughly reviewed with the patient. Ample time for discussion and questions allowed. The patient understood, was satisfied, and agreed to proceed.     HPI: Raymond Vasquez is a 71 y.o. male who presents for surveillance colonoscopy.  Medical history as below.  Tolerated the prep.  No recent chest pain or shortness of breath.  No abdominal pain today.  Past Medical History:  Diagnosis Date   Allergy    SEASONAL   Angioedema    he is unsure of this or cause   Arthritis    RIGHT shoulder   Balanitis 04/12/2013   Diabetes mellitus type II, controlled, with no complications ([place])    on meds   Hemorrhoids    History of laparoscopic adjustable gastric banding, 08/10/2009 07/05/2013   Hyperlipidemia    on meds   Hypertension    Nephrolithiasis    1980s   OSA (obstructive sleep apnea)    PMR (polymyalgia rheumatica) ([place])    Seizures ([place])    03/2021   Shingles    2014    Past Surgical History:  Procedure Laterality Date   APPENDECTOMY     COLONOSCOPY  2019   JMP-MAC-2 day mira/sup(good with lavage)-polyps   FINGER SURGERY  06/2012   LAPAROSCOPIC GASTRIC BANDING  08/10/2009   POLYPECTOMY  2019   polyps   ROTATOR CUFF REPAIR Right 06/2019   Dr Noemi Chapel   TONSILLECTOMY  1959    Prior to Admission medications   Medication Sig Start Date End Date Taking? Authorizing Provider  Cyanocobalamin (VITAMIN B 12 PO) Take 1 tablet by mouth daily at 6 (six) AM.   Yes [provider]  diclofenac (VOLTAREN) 75 MG EC tablet Take 75 mg by mouth daily. 02/15/19  Yes [provider]   empagliflozin (JARDIANCE) 25 MG TABS tablet Take 1 tablet (25 mg total) by mouth daily. 10/13/21  Yes Elayne Snare, MD  folic acid (FOLVITE) 1 MG tablet Take 1 mg by mouth daily.   Yes [provider]  glimepiride (AMARYL) 4 MG tablet TAKE 1/2 TABLET BY MOUTH EVERY DAY WITH DINNER 07/13/21  Yes Elayne Snare, MD  glucose blood (ONETOUCH VERIO) test strip USE TO CHECK BLOOD SUGAR ONCE DAILY ALTERNATING FASTING AND 2 HOURS AFTER A MEAL 09/17/21  Yes Elayne Snare, MD  glucose blood test strip OneTouch Verio test strips  USE TO CHECK BLOOD SUGAR ONCE DAILY ALTERNATING FASTING AND 2 HOURS AFTER A MEAL   Yes [provider]  metFORMIN (GLUCOPHAGE) 1000 MG tablet TAKE 1 TABLET BY MOUTH TWICE DAILY WITH MEAL 10/25/21  Yes Elayne Snare, MD  Methotrexate Sodium (METHOTREXATE, PF,) 50 MG/2ML injection INJECT 1 ML ONCE A WEEK 05/24/18  Yes [provider]  [facility] DELICA LANCETS FINE MISC Use as instructed by provider 05/13/14  Yes Dutch Quint B, FNP  rosuvastatin (CRESTOR) 20 MG tablet TAKE 1 TABLET(20 MG) BY MOUTH DAILY 08/27/21  Yes Koberlein, Junell C, MD  nystatin cream (MYCOSTATIN) Apply 1 application topically 2 (two) times daily. 07/08/21   Billie Ruddy, MD  tirzepatide [facility]) 5 MG/0.5ML Pen Administer 16m under the skin once weekly 09/06/21  Elayne Snare, MD    Current Outpatient Medications  Medication Sig Dispense Refill   Cyanocobalamin (VITAMIN B 12 PO) Take 1 tablet by mouth daily at 6 (six) AM.     diclofenac (VOLTAREN) 75 MG EC tablet Take 75 mg by mouth daily.     empagliflozin (JARDIANCE) 25 MG TABS tablet Take 1 tablet (25 mg total) by mouth daily. 30 tablet 2   folic acid (FOLVITE) 1 MG tablet Take 1 mg by mouth daily.     glimepiride (AMARYL) 4 MG tablet TAKE 1/2 TABLET BY MOUTH EVERY DAY WITH DINNER 90 tablet 0   glucose blood (ONETOUCH VERIO) test strip USE TO CHECK BLOOD SUGAR ONCE DAILY ALTERNATING FASTING AND 2 HOURS AFTER A MEAL 50 strip 2   glucose blood  test strip OneTouch Verio test strips  USE TO CHECK BLOOD SUGAR ONCE DAILY ALTERNATING FASTING AND 2 HOURS AFTER A MEAL     metFORMIN (GLUCOPHAGE) 1000 MG tablet TAKE 1 TABLET BY MOUTH TWICE DAILY WITH MEAL 180 tablet 1   Methotrexate Sodium (METHOTREXATE, PF,) 50 MG/2ML injection INJECT 1 ML ONCE A WEEK     ONETOUCH DELICA LANCETS FINE MISC Use as instructed by provider 200 each 3   rosuvastatin (CRESTOR) 20 MG tablet TAKE 1 TABLET(20 MG) BY MOUTH DAILY 90 tablet 1   nystatin cream (MYCOSTATIN) Apply 1 application topically 2 (two) times daily. 30 g 0   tirzepatide (MOUNJARO) 5 MG/0.5ML Pen Administer 36m under the skin once weekly 2 mL 3   Current Facility-Administered Medications  Medication Dose Route Frequency Provider Last Rate Last Admin   0.9 %  sodium chloride infusion  500 mL Intravenous Once Rylan Bernard, JLajuan Lines MD       0.9 %  sodium chloride infusion  500 mL Intravenous Once Jessejames Steelman, JLajuan Lines MD       0.9 %  sodium chloride infusion  500 mL Intravenous Once Zola Runion, JLajuan Lines MD        Allergies as of 12/06/2021   (No Known Allergies)    Family History  Problem Relation Age of Onset   Heart disease Mother    Diabetes Maternal Grandmother    Colon polyps Neg Hx    Colon cancer Neg Hx    Esophageal cancer Neg Hx    Rectal cancer Neg Hx    Stomach cancer Neg Hx    Crohn's disease Neg Hx     Social History   Socioeconomic History   Marital status: Married    Spouse name: Not on file   Number of children: Not on file   Years of education: Not on file   Highest education level: Not on file  Occupational History   Occupation: retired  Tobacco Use   Smoking status: Some Days    Types: Cigars    Passive exposure: Current (WIFE SMOKES)   Smokeless tobacco: Current    Types: Chew   Tobacco comments:    1 cigar occassionally, not more then 1x per week  Vaping Use   Vaping Use: Never used  Substance and Sexual Activity   Alcohol use: Yes    Comment: rarely   Drug use: No    Sexual activity: Not on file  Other Topics Concern   Not on file  Social History Narrative   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      Spiritual Beliefs: none  Lifestyle: no regular exercise, healthy diet      Right handed    Social Determinants of Health   Financial Resource Strain: Low Risk  (03/19/2021)   Overall Financial Resource Strain (CARDIA)    Difficulty of Paying Living Expenses: Not hard at all  Food Insecurity: No Food Insecurity (03/19/2021)   Hunger Vital Sign    Worried About Running Out of Food in the Last Year: Never true    Ran Out of Food in the Last Year: Never true  Transportation Needs: No Transportation Needs (03/19/2021)   PRAPARE - Hydrologist (Medical): No    Lack of Transportation (Non-Medical): No  Physical Activity: Inactive (03/19/2021)   Exercise Vital Sign    Days of Exercise per Week: 0 days    Minutes of Exercise per Session: 0 min  Stress: No Stress Concern Present (03/19/2021)   [place]    Feeling of Stress : Not at all  Social Connections: Moderately Isolated (03/19/2021)   Social Connection and Isolation Panel [NHANES]    Frequency of Communication with Friends and Family: Twice a week    Frequency of Social Gatherings with Friends and Family: Twice a week    Attends Religious Services: Never    Marine scientist or Organizations: No    Attends Archivist Meetings: Never    Marital Status: Married  Human resources officer Violence: Not At Risk (03/19/2021)   Humiliation, Afraid, Rape, and Kick questionnaire    Fear of Current or Ex-Partner: No    Emotionally Abused: No    Physically Abused: No    Sexually Abused: No    Physical Exam: Vital signs in last 24 hours: _0  139/87   Pulse 81   Temp 98.6 F (37 C) (Temporal)   Ht _1  (1.778 m)   Wt 193 lb  (87.5 kg)   SpO2 97%   BMI 27.69 kg/m  GEN: NAD EYE: Sclerae anicteric ENT: MMM CV: Non-tachycardic Pulm: CTA b/l GI: Soft, NT/ND NEURO:  Alert & Oriented x 3   Zenovia Jarred, MD [facility]  12/06/2021 9:01 AM

## 2021-12-06 NOTE — Op Note (Signed)
[name] Patient Name: Raymond Vasquez Procedure Date: 12/06/2021 9:00 AM MRN: [id number] Endoscopist: Jerene Bears , MD Age: 71 Referring MD:  Date of Birth: 1950/11/09 Gender: Male Account #: [id number] Procedure:                Colonoscopy Indications:              High risk colon cancer surveillance: Personal                            history of multiple adenomas, Last colonoscopy:                            June 2019 (3 TAs < 1 cm at last exam) Medicines:                Monitored Anesthesia Care Procedure:                Pre-Anesthesia Assessment:                           - Prior to the procedure, a History and Physical                            was performed, and patient medications and                            allergies were reviewed. The patient's tolerance of                            previous anesthesia was also reviewed. The risks                            and benefits of the procedure and the sedation                            options and risks were discussed with the patient.                            All questions were answered, and informed consent                            was obtained. Prior Anticoagulants: The patient has                            taken no previous anticoagulant or antiplatelet                            agents. ASA Grade Assessment: II - A patient with                            mild systemic disease. After reviewing the risks                            and benefits, the patient was deemed in  satisfactory condition to undergo the procedure.                           After obtaining informed consent, the colonoscope                            was passed under direct vision. Throughout the                            procedure, the patient's blood pressure, pulse, and                            oxygen saturations were monitored continuously. The                            CF [id number] #[id number] was introduced  through the anus                            and advanced to the cecum, identified by                            appendiceal orifice and ileocecal valve. The                            colonoscopy was performed without difficulty. The                            patient tolerated the procedure well. The quality                            of the bowel preparation was good after copious                            irrigation and lavage (2 day MiraLax + GoLytely).                            The ileocecal valve, appendiceal orifice, and                            rectum were photographed. Scope In: 9:08:53 AM Scope Out: 9:40:39 AM Scope Withdrawal Time: 0 hours 23 minutes 26 seconds  Total Procedure Duration: 0 hours 31 minutes 46 seconds  Findings:                 The digital rectal exam was normal.                           A 3 mm polyp was found in the ascending colon. The                            polyp was sessile. The polyp was removed with a                            cold snare. Resection and retrieval were complete.  The exam was otherwise without abnormality on                            direct and retroflexion views. Complications:            No immediate complications. Estimated Blood Loss:     Estimated blood loss: none. Impression:               - One 3 mm polyp in the ascending colon, removed                            with a cold snare. Resected and retrieved.                           - The examination was otherwise normal on direct                            and retroflexion views. Recommendation:           - Patient has a contact number available for                            emergencies. The signs and symptoms of potential                            delayed complications were discussed with the                            patient. Return to normal activities tomorrow.                            Written discharge instructions were provided to the                             patient.                           - Resume previous diet.                           - Continue present medications.                           - Await pathology results.                           - Repeat colonoscopy is recommended for                            surveillance. The colonoscopy date will be                            determined after pathology results from today's                            exam become available for review. Jerene Bears, MD 12/06/2021 9:43:23 AM This report has been  signed electronically.

## 2021-12-06 NOTE — Progress Notes (Signed)
Called to room to assist during endoscopic procedure.  Patient ID and intended procedure confirmed with present staff. Received instructions for my participation in the procedure from the performing physician.  

## 2021-12-06 NOTE — Progress Notes (Signed)
To pacu, VSS. Report to [email or web address] 

## 2021-12-06 NOTE — Progress Notes (Signed)
VS completed by CW.   Pt's states no medical or surgical changes since previsit or office visit.  

## 2021-12-07 ENCOUNTER — Telehealth: Payer: Self-pay

## 2021-12-07 NOTE — Telephone Encounter (Signed)
  Follow up Call-     12/06/2021    8:42 AM  Call back number  Post procedure Call Back phone  # [phone]  Permission to leave phone message Yes     Patient questions:  Do you have a fever, pain , or abdominal swelling? No. Pain Score  0 *  Have you tolerated food without any problems? Yes.    Have you been able to return to your normal activities? Yes.    Do you have any questions about your discharge instructions: Diet   No. Medications  No. Follow up visit  No.  Do you have questions or concerns about your Care? No.  Actions: * If pain score is 4 or above: No action needed, pain <4.

## 2021-12-10 ENCOUNTER — Encounter: Payer: Self-pay | Admitting: Internal Medicine

## 2021-12-31 ENCOUNTER — Other Ambulatory Visit: Payer: Self-pay | Admitting: Endocrinology

## 2021-12-31 DIAGNOSIS — E1165 Type 2 diabetes mellitus with hyperglycemia: Secondary | ICD-10-CM

## 2022-01-03 ENCOUNTER — Other Ambulatory Visit: Payer: Self-pay | Admitting: Endocrinology

## 2022-01-12 ENCOUNTER — Telehealth: Payer: Self-pay

## 2022-01-12 NOTE — Telephone Encounter (Signed)
Patient Advocate Encounter   Received notification from [facility] that prior authorization is required for Glimepiride 4 MG. PA submitted and APPROVED on 01/12/2022.  Key [id number] Effective: 01/12/2022 - 05/22/2022  [facility] Patient Advocate Specialist Phone: [phone]

## 2022-01-25 ENCOUNTER — Other Ambulatory Visit: Payer: Self-pay

## 2022-01-25 DIAGNOSIS — E1165 Type 2 diabetes mellitus with hyperglycemia: Secondary | ICD-10-CM

## 2022-01-25 MED ORDER — EMPAGLIFLOZIN 25 MG PO TABS: 25.0000 mg | ORAL_TABLET | Freq: Every day | ORAL | 2 refills | Status: DC

## 2022-02-13 ENCOUNTER — Other Ambulatory Visit: Payer: Self-pay | Admitting: Endocrinology

## 2022-03-04 ENCOUNTER — Other Ambulatory Visit (INDEPENDENT_AMBULATORY_CARE_PROVIDER_SITE_OTHER): Payer: Medicare Other

## 2022-03-04 DIAGNOSIS — E1169 Type 2 diabetes mellitus with other specified complication: Secondary | ICD-10-CM | POA: Diagnosis not present

## 2022-03-04 DIAGNOSIS — E669 Obesity, unspecified: Secondary | ICD-10-CM

## 2022-03-04 LAB — BASIC METABOLIC PANEL
BUN: 13 mg/dL (ref 6–23)
CO2: 32 mEq/L (ref 19–32)
Calcium: 10 mg/dL (ref 8.4–10.5)
Chloride: 105 mEq/L (ref 96–112)
Creatinine, Ser: 0.76 mg/dL (ref 0.40–1.50)
GFR: 92.06 mL/min (ref >60.00)
Glucose, Bld: 129 mg/dL — ABNORMAL HIGH (ref 70–99)
Potassium: 4 mEq/L (ref 3.5–5.1)
Sodium: 143 mEq/L (ref 135–145)

## 2022-03-04 LAB — MICROALBUMIN / CREATININE URINE RATIO
Creatinine,U: 41.2 mg/dL
Microalb Creat Ratio: 2.1 mg/g (ref 0.0–30.0)
Microalb, Ur: 0.9 mg/dL (ref 0.0–1.9)

## 2022-03-04 LAB — HEMOGLOBIN A1C: Hgb A1c MFr Bld: 6.7 % — ABNORMAL HIGH (ref 4.6–6.5)

## 2022-03-08 ENCOUNTER — Encounter: Payer: Self-pay | Admitting: Endocrinology

## 2022-03-08 ENCOUNTER — Ambulatory Visit (INDEPENDENT_AMBULATORY_CARE_PROVIDER_SITE_OTHER): Payer: Medicare Other | Admitting: Endocrinology

## 2022-03-08 VITALS — BP 124/74 | HR 68 | Ht 69.0 in | Wt 193.4 lb

## 2022-03-08 DIAGNOSIS — E7800 Pure hypercholesterolemia, unspecified: Secondary | ICD-10-CM | POA: Diagnosis not present

## 2022-03-08 DIAGNOSIS — E1169 Type 2 diabetes mellitus with other specified complication: Secondary | ICD-10-CM | POA: Diagnosis not present

## 2022-03-08 DIAGNOSIS — E669 Obesity, unspecified: Secondary | ICD-10-CM | POA: Diagnosis not present

## 2022-03-08 NOTE — Progress Notes (Signed)
Patient ID: Raymond Vasquez, male   DOB: [birth year], 71 y.o.   MRN: [id number]          Reason for Appointment: Follow-up for Type 2 Diabetes    History of Present Illness:          Date of diagnosis of type 2 diabetes mellitus: 2003 approximately       Recent history:    Non-insulin hypoglycemic drugs : Metformin 1 g a.m., p.m., Amaryl 2 mg p.m., Mounjaro 5  mg weekly, Jardiance 25 mg daily  Current management, blood sugar patterns and problems identified: He has maintained his A1c at 6.7, previously 6.4 compared to as high as 7.7  He is able to continue Mounjaro 5 mg without any GI side effects  However he is still not motivated to do any exercise or walking Weight is about the same lately although down 4 pounds since June He forgets to check his readings after meals as usual  Usually trying to keep portion small  Highest blood sugar at home 162 and lab glucose was 129 fasting Despite taking Amaryl he does not get any low sugars and mostly eating 1 meal a day in the evening; today had a bagel in the morning but occasionally will skip breakfast also       Side effects from medications have been: None    Typical meal intake: Breakfast is usually skipped, sometimes juice and muffin.  Using smaller portions of meats now            Glucose monitoring:  done 1 times a day         Glucometer: One Touch Verio.       Blood Glucose readings from monitor download:  AVERAGE 123 Recent range 79-162 and 103 at lunchtime  Previously  PRE-MEAL Fasting Lunch Dinner Bedtime Overall  Glucose range: 85-160      Mean/median:     112   POST-MEAL PC Breakfast PC Lunch PC Dinner  Glucose range:   ?  Mean/median:        Dietician visit, most recent: Before his lap band surgery  Weight history: Maximum 280 in the past  Wt Readings from Last 3 Encounters:  03/08/22 193 lb 6.4 oz (87.7 kg)  12/06/21 193 lb (87.5 kg)  11/11/21 197 lb 12.8 oz (89.7 kg)    Glycemic control:   Lab  Results  Component Value Date   HGBA1C 6.7 (H) 03/04/2022   HGBA1C 6.4 11/02/2021   HGBA1C 6.9 (H) 07/01/2021   Lab Results  Component Value Date   MICROALBUR 0.9 03/04/2022   LDLCALC 45 03/10/2021   CREATININE 0.76 03/04/2022   Lab Results  Component Value Date   MICRALBCREAT 2.1 03/04/2022    Lab Results  Component Value Date   FRUCTOSAMINE 263 03/07/2019   FRUCTOSAMINE 281 03/22/2018    Lab on 03/04/2022  Component Date Value Ref Range Status   Microalb, Ur 03/04/2022 0.9  0.0 - 1.9 mg/dL Final   Creatinine,U 03/04/2022 41.2  mg/dL Final   Microalb Creat Ratio 03/04/2022 2.1  0.0 - 30.0 mg/g Final   Sodium 03/04/2022 143  135 - 145 mEq/L Final   Potassium 03/04/2022 4.0  3.5 - 5.1 mEq/L Final   Chloride 03/04/2022 105  96 - 112 mEq/L Final   CO2 03/04/2022 32  19 - 32 mEq/L Final   Glucose, Bld 03/04/2022 129 (H)  70 - 99 mg/dL Final   BUN 03/04/2022 13  6 - 23 mg/dL Final  Creatinine, Ser 03/04/2022 0.76  0.40 - 1.50 mg/dL Final   GFR 03/04/2022 92.06  >60.00 mL/min Final   Calculated using the CKD-EPI Creatinine Equation (2021)   Calcium 03/04/2022 10.0  8.4 - 10.5 mg/dL Final   Hgb A1c MFr Bld 03/04/2022 6.7 (H)  4.6 - 6.5 % Final   Glycemic Control Guidelines for People with Diabetes:Non Diabetic:  <6%Goal of Therapy: <7%Additional Action Suggested:  >8%     Allergies as of 03/08/2022   No Known Allergies      Medication List        Accurate as of March 08, 2022 12:07 PM. If you have any questions, ask your nurse or doctor.          diclofenac 75 MG EC tablet Commonly known as: VOLTAREN Take 75 mg by mouth daily.   empagliflozin 25 MG Tabs tablet Commonly known as: Jardiance Take 1 tablet (25 mg total) by mouth daily.   folic acid 1 MG tablet Commonly known as: FOLVITE Take 1 mg by mouth daily.   glimepiride 4 MG tablet Commonly known as: AMARYL TAKE 1/2 TABLET BY MOUTH EVERY DAY WITH DINNER   glucose blood test strip OneTouch Verio  test strips  USE TO CHECK BLOOD SUGAR ONCE DAILY ALTERNATING FASTING AND 2 HOURS AFTER A MEAL   OneTouch Verio test strip Generic drug: glucose blood USE TO CHECK BLOOD SUGAR ONCE DAILY ALTERNATING FASTING AND 2 HOURS AFTER A MEAL   metFORMIN 1000 MG tablet Commonly known as: GLUCOPHAGE TAKE 1 TABLET BY MOUTH TWICE DAILY WITH MEAL   methotrexate (PF) 50 MG/2ML injection INJECT 1 ML ONCE A WEEK   Mounjaro 5 MG/0.5ML Pen Generic drug: tirzepatide ADMINISTER 5MG UNDER THE SKIN 1 TIME WEEKLY   nystatin cream Commonly known as: MYCOSTATIN Apply 1 application topically 2 (two) times daily.   OneTouch Delica Lancets Fine Misc Use as instructed by provider   rosuvastatin 20 MG tablet Commonly known as: CRESTOR TAKE 1 TABLET(20 MG) BY MOUTH DAILY   VITAMIN B 12 PO Take 1 tablet by mouth daily at 6 (six) AM.        Allergies: No Known Allergies  Past Medical History:  Diagnosis Date   Allergy    SEASONAL   Angioedema    he is unsure of this or cause   Arthritis    RIGHT shoulder   Balanitis 04/12/2013   Diabetes mellitus type II, controlled, with no complications ([place])    on meds   Hemorrhoids    History of laparoscopic adjustable gastric banding, 08/10/2009 07/05/2013   Hyperlipidemia    on meds   Hypertension    Nephrolithiasis    1980s   OSA (obstructive sleep apnea)    PMR (polymyalgia rheumatica) ([place])    Seizures ([place])    03/2021   Shingles    2014    Past Surgical History:  Procedure Laterality Date   APPENDECTOMY     COLONOSCOPY  2019   JMP-MAC-2 day mira/sup(good with lavage)-polyps   FINGER SURGERY  06/2012   LAPAROSCOPIC GASTRIC BANDING  08/10/2009   POLYPECTOMY  2019   polyps   ROTATOR CUFF REPAIR Right 06/2019   Dr Noemi Chapel   TONSILLECTOMY  1959    Family History  Problem Relation Age of Onset   Heart disease Mother    Diabetes Maternal Grandmother    Colon polyps Neg Hx    Colon cancer Neg Hx    Esophageal cancer Neg Hx    Rectal  cancer Neg Hx    Stomach cancer Neg Hx    Crohn's disease Neg Hx     Social History:  reports that he has been smoking cigars. He has been exposed to tobacco smoke. His smokeless tobacco use includes chew. He reports current alcohol use. He reports that he does not use drugs.   Review of Systems   Lipid history: Followed by PCP, has had adequate levels on Crestor 20 mg daily    Lab Results  Component Value Date   CHOL 109 03/10/2021   HDL 38.90 (L) 03/10/2021   LDLCALC 45 03/10/2021   LDLDIRECT 154.1 02/16/2010   TRIG 124.0 03/10/2021   CHOLHDL 3 03/10/2021           Blood pressure history:  Has not been treated for hypertension He does take Jardiance  BP Readings from Last 3 Encounters:  03/08/22 124/74  12/06/21 129/72  11/11/21 124/88    Last eye exam in 2022 negative for retinopathy+  Most recent foot exam: 2/23  Currently known complications of diabetes: none, no symptoms of neuropathy  He is on methotrexate injections for his rheumatoid arthritis  B12 level was improved after starting supplements and he says he is still taking B12 tablets   LABS:  Lab on 03/04/2022  Component Date Value Ref Range Status   Microalb, Ur 03/04/2022 0.9  0.0 - 1.9 mg/dL Final   Creatinine,U 03/04/2022 41.2  mg/dL Final   Microalb Creat Ratio 03/04/2022 2.1  0.0 - 30.0 mg/g Final   Sodium 03/04/2022 143  135 - 145 mEq/L Final   Potassium 03/04/2022 4.0  3.5 - 5.1 mEq/L Final   Chloride 03/04/2022 105  96 - 112 mEq/L Final   CO2 03/04/2022 32  19 - 32 mEq/L Final   Glucose, Bld 03/04/2022 129 (H)  70 - 99 mg/dL Final   BUN 03/04/2022 13  6 - 23 mg/dL Final   Creatinine, Ser 03/04/2022 0.76  0.40 - 1.50 mg/dL Final   GFR 03/04/2022 92.06  >60.00 mL/min Final   Calculated using the CKD-EPI Creatinine Equation (2021)   Calcium 03/04/2022 10.0  8.4 - 10.5 mg/dL Final   Hgb A1c MFr Bld 03/04/2022 6.7 (H)  4.6 - 6.5 % Final   Glycemic Control Guidelines for People with  Diabetes:Non Diabetic:  <6%Goal of Therapy: <7%Additional Action Suggested:  >8%     Physical Examination:  BP 124/74   Pulse 68   Ht _0  (1.753 m)   Wt 193 lb 6.4 oz (87.7 kg)   SpO2 97%   BMI 28.56 kg/m     ASSESSMENT:  Diabetes type 2 non-insulin-dependent, BMI under 30  See history of present illness for detailed discussion of current diabetes management, blood sugar patterns and problems identified  His A1c is 6.7  Diabetes has been controlled with Jardiance 25 mg,, Amaryl 2 mg, Mounjaro 5  mg and Metformin  His blood sugar levels in the mornings are slightly variable but averaging 123 Not clear if his readings are high after meals However usually maintaining portions and his weight  Microalbumin normal  PLAN:    Continue 5 mg Mounjaro weekly He needs to start checking readings after dinner and he will ask his wife to remind him He will also need to start a walking program in the afternoon Discussed symptoms of hypoglycemia and he will let us know if this happens  He will discuss checking his lipids with his PCP at the next visit  He will need  to get reports from his upcoming follow-up eye exam  There are no Patient Instructions on file for this visit.        Elayne Snare 03/08/2022, 12:07 PM   Note: This office note was prepared with Dragon voice recognition system technology. Any transcriptional errors that result from this process are unintentional.

## 2022-03-08 NOTE — Patient Instructions (Addendum)
Start walking daily  Check blood sugars on waking up 2-3 days a week  Also check blood sugars about 2 hours after meals and do this after different meals by rotation  Recommended blood sugar levels on waking up are 90-130 and about 2 hours after meal is 130-160  Please bring your blood sugar monitor to each visit, thank you

## 2022-03-09 ENCOUNTER — Ambulatory Visit: Payer: Medicare Other | Admitting: Endocrinology

## 2022-03-18 ENCOUNTER — Other Ambulatory Visit: Payer: Self-pay | Admitting: *Deleted

## 2022-03-21 ENCOUNTER — Ambulatory Visit (INDEPENDENT_AMBULATORY_CARE_PROVIDER_SITE_OTHER): Payer: Medicare Other

## 2022-03-21 VITALS — Ht 69.0 in | Wt 189.0 lb

## 2022-03-21 DIAGNOSIS — Z0000 Encounter for general adult medical examination without abnormal findings: Secondary | ICD-10-CM | POA: Diagnosis not present

## 2022-03-21 NOTE — Progress Notes (Signed)
Subjective:   Raymond Vasquez is a 71 y.o. male who presents for Medicare Annual/Subsequent preventive examination.  Review of Systems    Virtual Visit via Telephone Note  I connected with  Raymond Vasquez on 03/21/22 at  8:45 AM EDT by telephone and verified that I am speaking with the correct person using two identifiers.  Location: Patient: Home Provider: office Persons participating in the virtual visit: patient/Nurse Health Advisor   I discussed the limitations, risks, security and privacy concerns of performing an evaluation and management service by telephone and the availability of in person appointments. The patient expressed understanding and agreed to proceed.  Interactive audio and video telecommunications were attempted between this nurse and patient, however failed, due to patient having technical difficulties OR patient did not have access to video capability.  We continued and completed visit with audio only.  Some vital signs may be absent or patient reported.   Raymond Peaches, LPN  Cardiac Risk Factors include: advanced age (>84mn, >>65women);male gender;diabetes mellitus;hypertension     Objective:    Today's Vitals   03/21/22 0849  Weight: 189 lb (85.7 kg)  Height: _0  (1.753 m)   Body mass index is 27.91 kg/m.     03/21/2022    9:00 AM 03/22/2021    8:46 AM 03/19/2021    1:10 PM 03/03/2020    3:32 PM 03/30/2015    2:08 PM  Advanced Directives  Does Patient Have a Medical Advance Directive? _1   Would patient like information on creating a medical advance directive? No - Patient declined  No - Patient declined  No - patient declined information    Current Medications (verified) Outpatient Encounter Medications as of 03/21/2022  Medication Sig   Cyanocobalamin (VITAMIN B 12 PO) Take 1 tablet by mouth daily at 6 (six) AM.   diclofenac (VOLTAREN) 75 MG EC tablet Take 75 mg by mouth daily.   empagliflozin (JARDIANCE) 25 MG TABS tablet  Take 1 tablet (25 mg total) by mouth daily.   folic acid (FOLVITE) 1 MG tablet Take 1 mg by mouth daily.   glimepiride (AMARYL) 4 MG tablet TAKE 1/2 TABLET BY MOUTH EVERY DAY WITH DINNER   glucose blood test strip OneTouch Verio test strips  USE TO CHECK BLOOD SUGAR ONCE DAILY ALTERNATING FASTING AND 2 HOURS AFTER A MEAL   metFORMIN (GLUCOPHAGE) 1000 MG tablet TAKE 1 TABLET BY MOUTH TWICE DAILY WITH MEAL   Methotrexate Sodium (METHOTREXATE, PF,) 50 MG/2ML injection INJECT 1 ML ONCE A WEEK   nystatin cream (MYCOSTATIN) Apply 1 application topically 2 (two) times daily.   ONETOUCH DELICA LANCETS FINE MISC Use as instructed by provider   ONETOUCH VERIO test strip USE TO CHECK BLOOD SUGAR ONCE DAILY ALTERNATING FASTING AND 2 HOURS AFTER A MEAL   rosuvastatin (CRESTOR) 20 MG tablet TAKE 1 TABLET(20 MG) BY MOUTH DAILY   tirzepatide (MOUNJARO) 5 MG/0.5ML Pen ADMINISTER 5MG UNDER THE SKIN 1 TIME WEEKLY   Facility-Administered Encounter Medications as of 03/21/2022  Medication   0.9 %  sodium chloride infusion   0.9 %  sodium chloride infusion    Allergies (verified) Patient has no known allergies.   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 07/05/2013   Hyperlipidemia    on meds   Hypertension    Nephrolithiasis    1980s   OSA (obstructive sleep apnea)    PMR (polymyalgia rheumatica) (HCC)    Seizures ([place])    03/2021   Shingles    2014   Past Surgical History:  Procedure Laterality Date   APPENDECTOMY     COLONOSCOPY  2019   JMP-MAC-2 day mira/sup(good with lavage)-polyps   FINGER SURGERY  06/2012   LAPAROSCOPIC GASTRIC BANDING  08/10/2009   POLYPECTOMY  2019   polyps   ROTATOR CUFF  REPAIR Right 06/2019   Dr Noemi Chapel   TONSILLECTOMY  1959   Family History  Problem Relation Age of Onset   Heart disease Mother    Diabetes Maternal Grandmother    Colon polyps Neg Hx    Colon cancer Neg Hx    Esophageal cancer Neg Hx    Rectal cancer Neg Hx    Stomach cancer Neg Hx    Crohn's disease Neg Hx    Social History   Socioeconomic History   Marital status: Married    Spouse name: Not on file   Number of children: Not on file   Years of education: Not on file   Highest education level: Not on file  Occupational History   Occupation: retired  Tobacco Use   Smoking status: Some Days    Types: Cigars    Passive exposure: Current (WIFE SMOKES)   Smokeless tobacco: Current    Types: Chew   Tobacco comments:    1 cigar occassionally, not more then 1x per week  Vaping Use   Vaping Use: Never used  Substance and Sexual Activity   Alcohol use: Yes    Comment: rarely   Drug use: No   Sexual activity: Not on file  Other Topics Concern   Not on file  Social History Narrative   Work or School: retired Armed forces technical officer with [employer] Situation: lives with wifes      Spiritual Beliefs: none      Lifestyle: no regular exercise, healthy diet      Right handed    Social Determinants of Health   Financial Resource Strain: Low Risk  (03/21/2022)   Overall Financial Resource Strain (CARDIA)    Difficulty of Paying Living Expenses: Not hard at all  Food Insecurity: No Food Insecurity (03/21/2022)   Hunger Vital Sign    Worried About Running Out of Food in the Last Year: Never true    Ran Out of Food in the Last Year: Never true  Transportation Needs: No Transportation Needs (03/21/2022)   PRAPARE - Hydrologist (Medical): No    Lack of Transportation (Non-Medical): No  Physical Activity: Inactive (03/21/2022)   Exercise Vital Sign    Days of Exercise per Week: 0 days    Minutes of Exercise per Session: 0 min  Stress:  No Stress Concern Present (03/21/2022)   [place]    Feeling of Stress : Not at all  Social Connections: Moderately Isolated (03/21/2022)   Social Connection and Isolation Panel [NHANES]    Frequency of Communication with Friends and Family: More than three times a week    Frequency of Social Gatherings with Friends and Family: More than  three times a week    Attends Religious Services: Never    Active Member of Clubs or Organizations: No    Attends Archivist Meetings: Never    Marital Status: Married    Tobacco Counseling Ready to quit: No Counseling given: Yes Tobacco comments: 1 cigar occassionally, not more then 1x per week   Clinical Intake:  Pre-visit preparation completed: NoNutrition Risk Assessment:  Has the patient had any N/V/D within the last 2 months?  No  Does the patient have any non-healing wounds?  No  Has the patient had any unintentional weight loss or weight gain?  No   Diabetes:  Is the patient diabetic?  Yes  If diabetic, was a CBG obtained today?  Yes CBG 140 Taken by patient Did the patient bring in their glucometer from home?  No  How often do you monitor your CBG's? Daily.   Financial Strains and Diabetes Management:  Are you having any financial strains with the device, your supplies or your medication? No .  Does the patient want to be seen by Chronic Care Management for management of their diabetes?  No  Would the patient like to be referred to a Nutritionist or for Diabetic Management?  No   Diabetic Exams:  Diabetic Eye Exam: Completed No. Overdue for diabetic eye exam. Pt has been advised about the importance in completing this exam. A referral has been placed today. Message sent to referral coordinator for scheduling purposes. Advised pt to expect a call from office referred to regarding appt.  Diabetic Foot Exam: Completed No. Pt has been advised about the  importance in completing this exam. Pt is scheduled for diabetic foot exam on Followed by Dwyane Dee.    Pain : No/denies pain     BMI - recorded: 27.91 Nutritional Status: BMI 25 -29 Overweight Nutritional Risks: None Diabetes: Yes CBG done?: Yes (CBG 140 Taken by patient) CBG resulted in Enter/ Edit results?: Yes Did pt. bring in CBG monitor from home?: No  How often do you need to have someone help you when you read instructions, pamphlets, or other written materials from your doctor or pharmacy?: 1 - Never  Diabetic?  Yes  Interpreter Needed?: No  Information entered by :: Rolene Arbour LPN   Activities of Daily Living    03/21/2022    8:59 AM  In your present state of health, do you have any difficulty performing the following activities:  Hearing? 0  Vision? 0  Difficulty concentrating or making decisions? 0  Walking or climbing stairs? 0  Dressing or bathing? 0  Doing errands, shopping? 0  Preparing Food and eating ? N  Using the Toilet? N  In the past six months, have you accidently leaked urine? N  Do you have problems with loss of bowel control? N  Managing your Medications? N  Managing your Finances? N  Housekeeping or managing your Housekeeping? N    Patient Care Team: Caren Macadam, MD (Inactive) as PCP - General (Family Medicine) Elayne Snare, MD as Consulting Physician (Endocrinology) Rosita Kea, PA-C (Inactive) (Family Medicine) Cameron Sprang, MD as Consulting Physician (Neurology)  Indicate any recent Medical Services you may have received from other than [facility] providers in the past year (date may be approximate).     Assessment:   This is a routine wellness examination for [place].  Hearing/Vision screen Hearing Screening - Comments:: Denies hearing difficulties   Vision Screening - Comments:: Wears rx glasses - up to date  with routine eye exams with  [facility]  Dietary issues and exercise activities discussed: Current Exercise  Habits: The patient does not participate in regular exercise at present, Exercise limited by: None identified   Goals Addressed               This Visit's Progress     Patient Stated (pt-stated)        Exercise more.       Depression Screen    03/21/2022    8:58 AM 03/19/2021    1:12 PM 03/19/2021    1:08 PM 03/10/2021    8:27 AM 03/03/2020    3:31 PM 02/28/2019    9:47 AM 07/24/2018    8:34 AM  PHQ 2/9 Scores  PHQ - 2 Score 0 0 0 1 0 0 0  PHQ- 9 Score       1    Fall Risk    03/21/2022    8:59 AM 03/22/2021    8:46 AM 03/19/2021    1:10 PM 03/10/2021    8:27 AM 03/03/2020    3:34 PM  Fall Risk   Falls in the past year? 0 0 0 0 0  Number falls in past yr: 0 0 0 0 0  Injury with Fall? 0 0 0 0 0  Risk for fall due to : No Fall Risks   No Fall Risks Impaired vision;Impaired balance/gait  Risk for fall due to: Comment     vertigo at times if bending over  Follow up Falls prevention discussed  Falls evaluation completed Falls evaluation completed Falls prevention discussed    FALL RISK PREVENTION PERTAINING TO THE HOME:  Any stairs in or around the home? Yes  If so, are there any without handrails? No  Home free of loose throw rugs in walkways, pet beds, electrical cords, etc? Yes  Adequate lighting in your home to reduce risk of falls? Yes   ASSISTIVE DEVICES UTILIZED TO PREVENT FALLS:  Life alert? No  Use of a cane, walker or w/c? No  Grab bars in the bathroom? No  Shower chair or bench in shower? Yes  Elevated toilet seat or a handicapped toilet? Yes   TIMED UP AND GO:  Was the test performed? No . Audio Visit   Cognitive Function:        03/21/2022    9:00 AM 03/03/2020    3:37 PM  6CIT Screen  What Year? 0 points 0 points  What month? 0 points 0 points  What time? 0 points   Count back from 20 0 points 0 points  Months in reverse 0 points 0 points  Repeat phrase 0 points 0 points  Total Score 0 points     Immunizations Immunization  History  Administered Date(s) Administered   Fluad Quad(high Dose 65+) 04/03/2019, 01/31/2020, 03/10/2021   Influenza Whole 02/20/2009, 02/21/2011   Influenza, High Dose Seasonal PF 06/05/2018   Influenza-Unspecified 01/22/2016, 02/20/2017   PFIZER(Purple Top)SARS-COV-2 Vaccination 07/30/2019, 08/30/2019   PNEUMOCOCCAL CONJUGATE-20 03/10/2021   Pneumococcal Conjugate-13 08/14/2013, 03/21/2014   Pneumococcal Polysaccharide-23 11/20/2014   Tdap 07/14/2011   Zoster Recombinat (Shingrix) 03/06/2019   Zoster, Live 08/14/2013    TDAP status: Due, Education has been provided regarding the importance of this vaccine. Advised may receive this vaccine at local pharmacy or Health Dept. Aware to provide a copy of the vaccination record if obtained from local pharmacy or Health Dept. Verbalized acceptance and understanding.  Flu Vaccine status: Up to date  Pneumococcal  vaccine status: Up to date  Covid-19 vaccine status: Completed vaccines  Qualifies for Shingles Vaccine? Yes   Zostavax completed Yes   Shingrix Completed?: Yes  Screening Tests Health Maintenance  Topic Date Due   OPHTHALMOLOGY EXAM  07/06/2021   COVID-19 Vaccine (3 - Pfizer risk series) 04/06/2022 (Originally 09/27/2019)   INFLUENZA VACCINE  08/21/2022 (Originally 12/21/2021)   TETANUS/TDAP  03/22/2023 (Originally 07/13/2021)   FOOT EXAM  07/08/2022   HEMOGLOBIN A1C  09/03/2022   Diabetic kidney evaluation - GFR measurement  03/05/2023   Diabetic kidney evaluation - Urine ACR  03/05/2023   Medicare Annual Wellness (AWV)  03/22/2023   COLONOSCOPY (Pts 45-41yr Insurance coverage will need to be confirmed)  12/07/2026   Pneumonia Vaccine 71 Years old  Completed   Hepatitis C Screening  Completed   HPV VACCINES  Aged Out   Zoster Vaccines- Shingrix  Discontinued    Health Maintenance  Health Maintenance Due  Topic Date Due   OPHTHALMOLOGY EXAM  07/06/2021    Colorectal cancer screening: Type of screening:  Colonoscopy. Completed 12/06/21. Repeat every 5 years  Lung Cancer Screening: (Low Dose CT Chest recommended if Age 69-80years, 30 pack-year currently smoking OR have quit w/in 15years.) does qualify.   Lung Cancer Screening Referral: Patient deferred  Additional Screening:  Hepatitis C Screening: does qualify; Completed 03/05/15  Vision Screening: Recommended annual ophthalmology exams for early detection of glaucoma and other disorders of the eye. Is the patient up to date with their annual eye exam?  Yes  Who is the provider or what is the name of the office in which the patient attends annual eye exams? [facility] If pt is not established with a provider, would they like to be referred to a provider to establish care? No .   Dental Screening: Recommended annual dental exams for proper oral hygiene  Community Resource Referral / Chronic Care Management:  CRR required this visit?  No   CCM required this visit?  No      Plan:     I have personally reviewed and noted the following in the patient's chart:   Medical and social history Use of alcohol, tobacco or illicit drugs  Current medications and supplements including opioid prescriptions. Patient is not currently taking opioid prescriptions. Functional ability and status Nutritional status Physical activity Advanced directives List of other physicians Hospitalizations, surgeries, and ER visits in previous 12 months Vitals Screenings to include cognitive, depression, and falls Referrals and appointments  In addition, I have reviewed and discussed with patient certain preventive protocols, quality metrics, and best practice recommendations. A written personalized care plan for preventive services as well as general preventive health recommendations were provided to patient.     BCriselda Peaches LPN   [id number]  Nurse Notes: None

## 2022-03-21 NOTE — Patient Instructions (Addendum)
Raymond Vasquez , Thank you for taking time to come for your Medicare Wellness Visit. I appreciate your ongoing commitment to your health goals. Please review the following plan we discussed and let me know if I can assist you in the future.   These are the goals we discussed:  Goals       Patient Stated (pt-stated)      Exercise more.        This is a list of the screening recommended for you and due dates:  Health Maintenance  Topic Date Due   Eye exam for diabetics  07/06/2021   COVID-19 Vaccine (3 - Pfizer risk series) 04/06/2022*   Flu Shot  08/21/2022*   Tetanus Vaccine  03/22/2023*   Complete foot exam   07/08/2022   Hemoglobin A1C  09/03/2022   Yearly kidney function blood test for diabetes  03/05/2023   Yearly kidney health urinalysis for diabetes  03/05/2023   Medicare Annual Wellness Visit  03/22/2023   Colon Cancer Screening  12/07/2026   Pneumonia Vaccine  Completed   Hepatitis C Screening: USPSTF Recommendation to screen - Ages 69-79 yo.  Completed   HPV Vaccine  Aged Out   Zoster (Shingles) Vaccine  Discontinued  *Topic was postponed. The date shown is not the original due date.    Advanced directives: Advance directive discussed with you today. Even though you declined this today, please call our office should you change your mind, and we can give you the proper paperwork for you to fill out.   Conditions/risks identified: None  Next appointment: Follow up in one year for your annual wellness visit.   Preventive Care 71 Years and Older, Male  Preventive care refers to lifestyle choices and visits with your health care provider that can promote health and wellness. What does preventive care include? A yearly physical exam. This is also called an annual well check. Dental exams once or twice a year. Routine eye exams. Ask your health care provider how often you should have your eyes checked. Personal lifestyle choices, including: Daily care of your teeth and  gums. Regular physical activity. Eating a healthy diet. Avoiding tobacco and drug use. Limiting alcohol use. Practicing safe sex. Taking low doses of aspirin every day. Taking vitamin and mineral supplements as recommended by your health care provider. What happens during an annual well check? The services and screenings done by your health care provider during your annual well check will depend on your age, overall health, lifestyle risk factors, and family history of disease. Counseling  Your health care provider may ask you questions about your: Alcohol use. Tobacco use. Drug use. Emotional well-being. Home and relationship well-being. Sexual activity. Eating habits. History of falls. Memory and ability to understand (cognition). Work and work Statistician. Screening  You may have the following tests or measurements: Height, weight, and BMI. Blood pressure. Lipid and cholesterol levels. These may be checked every 5 years, or more frequently if you are over 25 years old. Skin check. Lung cancer screening. You may have this screening every year starting at age 27 if you have a 30-pack-year history of smoking and currently smoke or have quit within the past 15 years. Fecal occult blood test (FOBT) of the stool. You may have this test every year starting at age 96. Flexible sigmoidoscopy or colonoscopy. You may have a sigmoidoscopy every 5 years or a colonoscopy every 10 years starting at age 54. Prostate cancer screening. Recommendations will vary depending on your family  history and other risks. Hepatitis C blood test. Hepatitis B blood test. Sexually transmitted disease (STD) testing. Diabetes screening. This is done by checking your blood sugar (glucose) after you have not eaten for a while (fasting). You may have this done every 1-3 years. Abdominal aortic aneurysm (AAA) screening. You may need this if you are a current or former smoker. Osteoporosis. You may be screened  starting at age 63 if you are at high risk. Talk with your health care provider about your test results, treatment options, and if necessary, the need for more tests. Vaccines  Your health care provider may recommend certain vaccines, such as: Influenza vaccine. This is recommended every year. Tetanus, diphtheria, and acellular pertussis (Tdap, Td) vaccine. You may need a Td booster every 10 years. Zoster vaccine. You may need this after age 89. Pneumococcal 13-valent conjugate (PCV13) vaccine. One dose is recommended after age 69. Pneumococcal polysaccharide (PPSV23) vaccine. One dose is recommended after age 73. Talk to your health care provider about which screenings and vaccines you need and how often you need them. This information is not intended to replace advice given to you by your health care provider. Make sure you discuss any questions you have with your health care provider. Document Released: 06/05/2015 Document Revised: 01/27/2016 Document Reviewed: 03/10/2015 Elsevier Interactive Patient Education  2017 [employer] Prevention in the Home Falls can cause injuries. They can happen to people of all ages. There are many things you can do to make your home safe and to help prevent falls. What can I do on the outside of my home? Regularly fix the edges of walkways and driveways and fix any cracks. Remove anything that might make you trip as you walk through a door, such as a raised step or threshold. Trim any bushes or trees on the path to your home. Use bright outdoor lighting. Clear any walking paths of anything that might make someone trip, such as rocks or tools. Regularly check to see if handrails are loose or broken. Make sure that both sides of any steps have handrails. Any raised decks and porches should have guardrails on the edges. Have any leaves, snow, or ice cleared regularly. Use sand or salt on walking paths during winter. Clean up any spills in your garage  right away. This includes oil or grease spills. What can I do in the bathroom? Use night lights. Install grab bars by the toilet and in the tub and shower. Do not use towel bars as grab bars. Use non-skid mats or decals in the tub or shower. If you need to sit down in the shower, use a plastic, non-slip stool. Keep the floor dry. Clean up any water that spills on the floor as soon as it happens. Remove soap buildup in the tub or shower regularly. Attach bath mats securely with double-sided non-slip rug tape. Do not have throw rugs and other things on the floor that can make you trip. What can I do in the bedroom? Use night lights. Make sure that you have a light by your bed that is easy to reach. Do not use any sheets or blankets that are too big for your bed. They should not hang down onto the floor. Have a firm chair that has side arms. You can use this for support while you get dressed. Do not have throw rugs and other things on the floor that can make you trip. What can I do in the kitchen? Clean up any  spills right away. Avoid walking on wet floors. Keep items that you use a lot in easy-to-reach places. If you need to reach something above you, use a strong step stool that has a grab bar. Keep electrical cords out of the way. Do not use floor polish or wax that makes floors slippery. If you must use wax, use non-skid floor wax. Do not have throw rugs and other things on the floor that can make you trip. What can I do with my stairs? Do not leave any items on the stairs. Make sure that there are handrails on both sides of the stairs and use them. Fix handrails that are broken or loose. Make sure that handrails are as long as the stairways. Check any carpeting to make sure that it is firmly attached to the stairs. Fix any carpet that is loose or worn. Avoid having throw rugs at the top or bottom of the stairs. If you do have throw rugs, attach them to the floor with carpet tape. Make  sure that you have a light switch at the top of the stairs and the bottom of the stairs. If you do not have them, ask someone to add them for you. What else can I do to help prevent falls? Wear shoes that: Do not have high heels. Have rubber bottoms. Are comfortable and fit you well. Are closed at the toe. Do not wear sandals. If you use a stepladder: Make sure that it is fully opened. Do not climb a closed stepladder. Make sure that both sides of the stepladder are locked into place. Ask someone to hold it for you, if possible. Clearly mark and make sure that you can see: Any grab bars or handrails. First and last steps. Where the edge of each step is. Use tools that help you move around (mobility aids) if they are needed. These include: Canes. Walkers. Scooters. Crutches. Turn on the lights when you go into a dark area. Replace any light bulbs as soon as they burn out. Set up your furniture so you have a clear path. Avoid moving your furniture around. If any of your floors are uneven, fix them. If there are any pets around you, be aware of where they are. Review your medicines with your doctor. Some medicines can make you feel dizzy. This can increase your chance of falling. Ask your doctor what other things that you can do to help prevent falls. This information is not intended to replace advice given to you by your health care provider. Make sure you discuss any questions you have with your health care provider. Document Released: 03/05/2009 Document Revised: 10/15/2015 Document Reviewed: 06/13/2014 Elsevier Interactive Patient Education  2017 [employer].

## 2022-03-22 ENCOUNTER — Other Ambulatory Visit: Payer: Self-pay | Admitting: Family Medicine

## 2022-03-22 MED ORDER — ROSUVASTATIN CALCIUM 20 MG PO TABS: ORAL_TABLET | ORAL | 0 refills | Status: DC

## 2022-03-22 NOTE — Telephone Encounter (Signed)
Ok to refill but he needs an appt to establish care

## 2022-04-07 ENCOUNTER — Encounter: Payer: Self-pay | Admitting: Family Medicine

## 2022-04-07 ENCOUNTER — Ambulatory Visit (INDEPENDENT_AMBULATORY_CARE_PROVIDER_SITE_OTHER): Payer: Medicare Other | Admitting: Family Medicine

## 2022-04-07 VITALS — BP 100/62 | HR 80 | Temp 97.7°F | Ht 69.0 in | Wt 195.1 lb

## 2022-04-07 DIAGNOSIS — Z23 Encounter for immunization: Secondary | ICD-10-CM

## 2022-04-07 DIAGNOSIS — E1169 Type 2 diabetes mellitus with other specified complication: Secondary | ICD-10-CM | POA: Diagnosis not present

## 2022-04-07 DIAGNOSIS — E785 Hyperlipidemia, unspecified: Secondary | ICD-10-CM | POA: Diagnosis not present

## 2022-04-07 NOTE — Patient Instructions (Signed)
Go to the a pharmacy to receive your Tdap vaccine and the second shingles vaccination.

## 2022-04-07 NOTE — Assessment & Plan Note (Signed)
Reviewed labs from last month, he is well controlled and is on statin medication, tolerating it well. He continues to follow with endocrine for this. Continue per their orders.

## 2022-04-07 NOTE — Progress Notes (Signed)
Established Patient Office Visit  Subjective   Patient ID: Raymond Vasquez, male    DOB: 1951/02/06  Age: 71 y.o. MRN: [id number]  Chief Complaint  Patient presents with   Establish Care    Patient is here for transition of care visit.  DM-- pt recently saw his endocrinologist, states that he is doing well with his blood sugars, reports compliance. States that he has his eye exam scheduled for January. Pt is on statin medication and the res  HM-- we reviewed his health maintenance, he is due for his flu shot and his Tdap as well as his second shingles vaccine. Pt instructed to go to the pharmacy for the Tdap and shingles.   Pt reports that he had surgery on the right hand for his trigger finger. States that the surgery went very well but now his left hand is hurting. States that he has an appointment with the surgeon    Current Outpatient Medications  Medication Instructions   Cyanocobalamin (VITAMIN B 12 PO) 1 tablet, Oral, Daily   Cyanocobalamin (VITAMIN B-12 PO) Oral, Daily   diclofenac (VOLTAREN) 75 mg, Oral, Daily   empagliflozin (JARDIANCE) 25 mg, Oral, Daily   folic acid (FOLVITE) 1 mg, Oral, Daily   glimepiride (AMARYL) 4 MG tablet TAKE 1/2 TABLET BY MOUTH EVERY DAY WITH DINNER   glucose blood test strip OneTouch Verio test strips  USE TO CHECK BLOOD SUGAR ONCE DAILY ALTERNATING FASTING AND 2 HOURS AFTER A MEAL   metFORMIN (GLUCOPHAGE) 1000 MG tablet TAKE 1 TABLET BY MOUTH TWICE DAILY WITH MEAL   Methotrexate Sodium (METHOTREXATE, PF,) 50 MG/2ML injection INJECT 1 ML ONCE A WEEK   nystatin cream (MYCOSTATIN) 1 application , Topical, 2 times daily   ONETOUCH DELICA LANCETS FINE MISC Use as instructed by provider   ONETOUCH VERIO test strip USE TO CHECK BLOOD SUGAR ONCE DAILY ALTERNATING FASTING AND 2 HOURS AFTER A MEAL   rosuvastatin (CRESTOR) 20 MG tablet TAKE 1 TABLET(20 MG) BY MOUTH DAILY   tirzepatide (MOUNJARO) 5 MG/0.5ML Pen ADMINISTER '5MG'$  UNDER THE SKIN 1 TIME WEEKLY     Patient Active Problem List   Diagnosis Date Noted   B12 deficiency 09/24/2020   RA (rheumatoid arthritis) ([place]) 07/24/2018   Hyperlipidemia associated with type 2 diabetes mellitus ([place]) 08/10/2017   Anal or rectal pain 08/10/2017   BMI 30.0-30.9,adult 08/04/2016   History of laparoscopic adjustable gastric banding, 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     Objective:     BP 100/62 (BP Location: Left Arm, Patient Position: Sitting, Cuff Size: Large)   Pulse 80   Temp 97.7 F (36.5 C) (Oral)   Ht '5\' 9"'$  (1.753 m)   Wt 195 lb 1.6 oz (88.5 kg)   SpO2 98%   BMI 28.81 kg/m    Physical Exam Vitals reviewed.  Constitutional:      Appearance: Normal appearance. He is well-groomed and normal weight.  Eyes:     Extraocular Movements: Extraocular movements intact.     Conjunctiva/sclera: Conjunctivae normal.  Neck:     Thyroid: No thyromegaly.  Cardiovascular:     Rate and Rhythm: Normal rate and regular rhythm.     Heart sounds: S1 normal and S2 normal. No murmur heard. Pulmonary:  Effort: Pulmonary effort is normal.     Breath sounds: Normal breath sounds and air entry. No rales.  Abdominal:     General: Abdomen is flat. Bowel sounds are normal.  Musculoskeletal:     Right lower leg: No edema.     Left lower leg: No edema.  Neurological:     General: No focal deficit present.     Mental Status: He is alert and oriented to person, place, and time.     Gait: Gait is intact.  Psychiatric:        Mood and Affect: Mood and affect normal.      No results found for any visits on 04/07/22.  Last metabolic panel Lab Results  Component Value Date   GLUCOSE 129 (H) 03/04/2022   NA 143 03/04/2022   K 4.0 03/04/2022   CL 105 03/04/2022   CO2 32  03/04/2022   BUN 13 03/04/2022   CREATININE 0.76 03/04/2022   GFRNONAA 89 (L) 08/03/2013   CALCIUM 10.0 03/04/2022   PROT 6.7 04/09/2021   ALBUMIN 4.8 04/09/2021   BILITOT 0.7 04/09/2021   ALKPHOS 45 04/09/2021   AST 29 04/09/2021   ALT 51 04/09/2021   Last hemoglobin A1c Lab Results  Component Value Date   HGBA1C 6.7 (H) 03/04/2022      The ASCVD Risk score (Arnett DK, et al., 2019) failed to calculate for the following reasons:   The valid total cholesterol range is 130 to 320 mg/dL    Assessment & Plan:   Problem List Items Addressed This Visit       Unprioritized   Hyperlipidemia associated with type 2 diabetes mellitus ([place])    Reviewed labs from last month, he is well controlled and is on statin medication, tolerating it well. He continues to follow with endocrine for this. Continue per their orders.      Other Visit Diagnoses     Immunization due    -  Primary   Relevant Orders   Flu Vaccine QUAD High Dose(Fluad)       Return in about 1 year (around 04/08/2023) for Annual visit.    Farrel Conners, MD

## 2022-04-16 ENCOUNTER — Other Ambulatory Visit: Payer: Self-pay | Admitting: Endocrinology

## 2022-04-16 DIAGNOSIS — E1165 Type 2 diabetes mellitus with hyperglycemia: Secondary | ICD-10-CM

## 2022-04-23 ENCOUNTER — Other Ambulatory Visit: Payer: Self-pay | Admitting: Endocrinology

## 2022-04-29 ENCOUNTER — Other Ambulatory Visit: Payer: Self-pay | Admitting: Endocrinology

## 2022-04-29 DIAGNOSIS — E1165 Type 2 diabetes mellitus with hyperglycemia: Secondary | ICD-10-CM

## 2022-06-28 ENCOUNTER — Other Ambulatory Visit: Payer: Self-pay | Admitting: Family Medicine

## 2022-07-11 NOTE — Progress Notes (Unsigned)
Patient ID: Raymond Vasquez, male   DOB: 1951-05-23, 72 y.o.   MRN: [id number]          Reason for Appointment: Follow-up for Type 2 Diabetes    History of Present Illness:          Date of diagnosis of type 2 diabetes mellitus: 2003 approximately       Recent history:    Non-insulin hypoglycemic drugs : Metformin 1 g a.m., p.m., Amaryl 2 mg p.m., Mounjaro 5  mg weekly, Jardiance 25 mg daily  Current management, blood sugar patterns and problems identified: He has relatively better A1c of 6.1  Previously as high as 7.7  Not clear how his A1c has improved even though he is on the same regimen including Mounjaro since 11/22 He is only checking his blood sugars in the mornings and these appear to be similar to before Also has not had any significant weight change Partly because of joint pains he does not do any exercise and only does some yard work and warm weather Lab glucose was 145 after coughing No side effects from [place] No symptoms of low blood sugars during the day       Side effects from medications have been: None    Typical meal intake: Breakfast is usually skipped, sometimes juice and muffin.  Using smaller portions of meats now            Glucose monitoring:  done 1 times a day         Glucometer: One Touch Verio.       Blood Glucose readings from recall:     PRE-MEAL Fasting Lunch Dinner Bedtime Overall  Glucose range: 90-130    ?  Averages:        POST-MEAL PC Breakfast PC Lunch PC Dinner  Glucose range:     Averages:      Previously: AVERAGE 123 Recent range 79-162 and 103 at lunchtime   Dietician visit, most recent: Before his lap band surgery  Weight history: Maximum 280 in the past  Wt Readings from Last 3 Encounters:  07/12/22 193 lb 12.8 oz (87.9 kg)  04/07/22 195 lb 1.6 oz (88.5 kg)  03/21/22 189 lb (85.7 kg)    Glycemic control:   Lab Results  Component Value Date   HGBA1C 6.1 (A) 07/12/2022   HGBA1C 6.7 (H) 03/04/2022   HGBA1C  6.4 11/02/2021   Lab Results  Component Value Date   MICROALBUR 0.9 03/04/2022   LDLCALC 39 07/12/2022   CREATININE 0.71 07/12/2022   Lab Results  Component Value Date   MICRALBCREAT 2.1 03/04/2022    Lab Results  Component Value Date   FRUCTOSAMINE 263 03/07/2019   FRUCTOSAMINE 281 03/22/2018    Office Visit on 07/12/2022  Component Date Value Ref Range Status   Hemoglobin A1C 07/12/2022 6.1 (A)  4.0 - 5.6 % Final   POC Glucose 07/12/2022 142 (A)  70 - 99 mg/dl Final   Vitamin B-12 07/12/2022 537  211 - 911 pg/mL Final   Cholesterol 07/12/2022 106  0 - 200 mg/dL Final   ATP III Classification       Desirable:  < 200 mg/dL               Borderline High:  200 - 239 mg/dL          High:  > = 240 mg/dL   Triglycerides 07/12/2022 165.0 (H)  0.0 - 149.0 mg/dL Final   Normal:  <150 mg/dLBorderline  High:  150 - 199 mg/dL   HDL 07/12/2022 34.50 (L)  >39.00 mg/dL Final   VLDL 07/12/2022 33.0  0.0 - 40.0 mg/dL Final   LDL Cholesterol 07/12/2022 39  0 - 99 mg/dL Final   Total CHOL/HDL Ratio 07/12/2022 3   Final                  Men          Women1/2 Average Risk     3.4          3.3Average Risk          5.0          4.42X Average Risk          9.6          7.13X Average Risk          15.0          11.0                       NonHDL 07/12/2022 71.67   Final   NOTE:  Non-HDL goal should be 30 mg/dL higher than patient's LDL goal (i.e. LDL goal of < 70 mg/dL, would have non-HDL goal of < 100 mg/dL)   Sodium 07/12/2022 141  135 - 145 mEq/L Final   Potassium 07/12/2022 4.1  3.5 - 5.1 mEq/L Final   Chloride 07/12/2022 104  96 - 112 mEq/L Final   CO2 07/12/2022 29  19 - 32 mEq/L Final   Glucose, Bld 07/12/2022 145 (H)  70 - 99 mg/dL Final   BUN 07/12/2022 14  6 - 23 mg/dL Final   Creatinine, Ser 07/12/2022 0.71  0.40 - 1.50 mg/dL Final   Total Bilirubin 07/12/2022 0.5  0.2 - 1.2 mg/dL Final   Alkaline Phosphatase 07/12/2022 41  39 - 117 U/L Final   AST 07/12/2022 30  0 - 37 U/L Final   ALT  07/12/2022 31  0 - 53 U/L Final   Total Protein 07/12/2022 6.5  6.0 - 8.3 g/dL Final   Albumin 07/12/2022 4.3  3.5 - 5.2 g/dL Final   GFR 07/12/2022 93.74  >60.00 mL/min Final   Calculated using the CKD-EPI Creatinine Equation (2021)   Calcium 07/12/2022 9.7  8.4 - 10.5 mg/dL Final    Allergies as of 07/12/2022   No Known Allergies      Medication List        Accurate as of July 12, 2022  1:34 PM. If you have any questions, ask your nurse or doctor.          diclofenac 75 MG EC tablet Commonly known as: VOLTAREN Take 75 mg by mouth daily.   folic acid 1 MG tablet Commonly known as: FOLVITE Take 1 mg by mouth daily.   glimepiride 4 MG tablet Commonly known as: AMARYL TAKE 1/2 TABLET BY MOUTH EVERY DAY WITH DINNER   glucose blood test strip OneTouch Verio test strips  USE TO CHECK BLOOD SUGAR ONCE DAILY ALTERNATING FASTING AND 2 HOURS AFTER A MEAL   OneTouch Verio test strip Generic drug: glucose blood USE TO CHECK BLOOD SUGAR ONCE DAILY ALTERNATING FASTING AND 2 HOURS AFTER A MEAL   Jardiance 25 MG Tabs tablet Generic drug: empagliflozin TAKE 1 TABLET(25 MG) BY MOUTH DAILY   metFORMIN 1000 MG tablet Commonly known as: GLUCOPHAGE TAKE 1 TABLET BY MOUTH TWICE DAILY WITH MEAL   methotrexate (PF) 50 MG/2ML injection INJECT 1 ML ONCE A  WEEK   Mounjaro 5 MG/0.5ML Pen Generic drug: tirzepatide ADMINISTER 5MG INTO THE SKIN ONCE WEEKLY   nystatin cream Commonly known as: MYCOSTATIN Apply 1 application topically 2 (two) times daily.   OneTouch Delica Lancets Fine Misc Use as instructed by provider   rosuvastatin 20 MG tablet Commonly known as: CRESTOR TAKE 1 TABLET(20 MG) BY MOUTH DAILY   VITAMIN B 12 PO Take 1 tablet by mouth daily at 6 (six) AM.   VITAMIN B-12 PO Take by mouth daily.        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 07/05/2013   Hyperlipidemia    on meds   Hypertension    Nephrolithiasis    1980s   OSA (obstructive sleep apnea)    PMR (polymyalgia rheumatica) ([facility])    Seizures ([facility])    03/2021   Shingles    2014    Past Surgical History:  Procedure Laterality Date   APPENDECTOMY     COLONOSCOPY  2019   JMP-MAC-2 day mira/sup(good with lavage)-polyps   FINGER SURGERY  06/2012   LAPAROSCOPIC GASTRIC BANDING  08/10/2009   POLYPECTOMY  2019   polyps   ROTATOR CUFF REPAIR Right 06/2019   Dr Noemi Chapel   TONSILLECTOMY  1959    Family History  Problem Relation Age of Onset   Heart disease Mother    Diabetes Maternal Grandmother    Colon polyps Neg Hx    Colon cancer Neg Hx    Esophageal cancer Neg Hx    Rectal cancer Neg Hx    Stomach cancer Neg Hx    Crohn's disease Neg Hx     Social History:  reports that he has been smoking cigars. He has been exposed to tobacco smoke. His smokeless tobacco use includes chew. He reports current alcohol use. He reports that he does not use drugs.   Review of Systems   Lipid history: Followed by PCP, has had adequate levels on Crestor 20 mg daily    Lab Results  Component Value Date   CHOL 106 07/12/2022   HDL 34.50 (L) 07/12/2022   LDLCALC 39 07/12/2022   LDLDIRECT 154.1 02/16/2010   TRIG 165.0 (H) 07/12/2022   CHOLHDL 3 07/12/2022           Blood pressure history:  Has not been treated for hypertension He does take Jardiance  BP Readings from Last 3 Encounters:  07/12/22 114/78  04/07/22 100/62  03/08/22 124/74    Last eye exam in 2022 negative for retinopathy+  Most recent foot exam: 2/23  Currently known complications of diabetes: none, no symptoms of neuropathy  He is on methotrexate injections for his rheumatoid  arthritis  B12 level was improved after starting supplements  Recently he is still taking B12 tablets but has not had any monitoring of B12 level   LABS:  Office Visit on 07/12/2022  Component Date Value Ref Range Status   Hemoglobin A1C 07/12/2022 6.1 (A)  4.0 - 5.6 % Final   POC Glucose 07/12/2022 142 (A)  70 - 99 mg/dl Final   Vitamin B-12 07/12/2022 537  211 -  911 pg/mL Final   Cholesterol 07/12/2022 106  0 - 200 mg/dL Final   ATP III Classification       Desirable:  < 200 mg/dL               Borderline High:  200 - 239 mg/dL          High:  > = 240 mg/dL   Triglycerides 07/12/2022 165.0 (H)  0.0 - 149.0 mg/dL Final   Normal:  <150 mg/dLBorderline High:  150 - 199 mg/dL   HDL 07/12/2022 34.50 (L)  >39.00 mg/dL Final   VLDL 07/12/2022 33.0  0.0 - 40.0 mg/dL Final   LDL Cholesterol 07/12/2022 39  0 - 99 mg/dL Final   Total CHOL/HDL Ratio 07/12/2022 3   Final                  Men          Women1/2 Average Risk     3.4          3.3Average Risk          5.0          4.42X Average Risk          9.6          7.13X Average Risk          15.0          11.0                       NonHDL 07/12/2022 71.67   Final   NOTE:  Non-HDL goal should be 30 mg/dL higher than patient's LDL goal (i.e. LDL goal of < 70 mg/dL, would have non-HDL goal of < 100 mg/dL)   Sodium 07/12/2022 141  135 - 145 mEq/L Final   Potassium 07/12/2022 4.1  3.5 - 5.1 mEq/L Final   Chloride 07/12/2022 104  96 - 112 mEq/L Final   CO2 07/12/2022 29  19 - 32 mEq/L Final   Glucose, Bld 07/12/2022 145 (H)  70 - 99 mg/dL Final   BUN 07/12/2022 14  6 - 23 mg/dL Final   Creatinine, Ser 07/12/2022 0.71  0.40 - 1.50 mg/dL Final   Total Bilirubin 07/12/2022 0.5  0.2 - 1.2 mg/dL Final   Alkaline Phosphatase 07/12/2022 41  39 - 117 U/L Final   AST 07/12/2022 30  0 - 37 U/L Final   ALT 07/12/2022 31  0 - 53 U/L Final   Total Protein 07/12/2022 6.5  6.0 - 8.3 g/dL Final   Albumin 07/12/2022 4.3  3.5 - 5.2 g/dL Final   GFR 07/12/2022  93.74  >60.00 mL/min Final   Calculated using the CKD-EPI Creatinine Equation (2021)   Calcium 07/12/2022 9.7  8.4 - 10.5 mg/dL Final    Physical Examination:  BP 114/78 (BP Location: Left Arm, Patient Position: Sitting, Cuff Size: Normal)   Pulse 77   Ht 5' 9"$  (1.753 m)   Wt 193 lb 12.8 oz (87.9 kg)   SpO2 96%   BMI 28.62 kg/m     ASSESSMENT:  Diabetes type 2 non-insulin-dependent, BMI under 30  See history of present illness for detailed discussion of current diabetes management, blood sugar patterns and problems identified  His A1c is 6.1  Diabetes has been controlled with Jardiance 25 mg,, Amaryl 2 mg, Mounjaro 5  mg and Metformin  His blood sugar levels in the mornings are about the same as before although he did not  bring his monitor He still does not check readings after meals May have better control overall because of improved diet although his weight is still about the same Continues to need 4 types of diabetes medications and no hypoglycemia also   PLAN:    Continue 5 mg Mounjaro weekly Reminded him to check blood sugars after meals Regular exercise as tolerated  He will need to get reports from his upcoming follow-up eye exam  Will need follow-up lipids and B12 level  There are no Patient Instructions on file for this visit.        Elayne Snare 07/12/2022, 1:34 PM   Note: This office note was prepared with Dragon voice recognition system technology. Any transcriptional errors that result from this process are unintentional.  Addendum: B12 normal, triglycerides 165 otherwise lipids controlled

## 2022-07-12 ENCOUNTER — Ambulatory Visit (INDEPENDENT_AMBULATORY_CARE_PROVIDER_SITE_OTHER): Payer: Medicare Other | Admitting: Endocrinology

## 2022-07-12 ENCOUNTER — Encounter: Payer: Self-pay | Admitting: Endocrinology

## 2022-07-12 VITALS — BP 114/78 | HR 77 | Ht 69.0 in | Wt 193.8 lb

## 2022-07-12 DIAGNOSIS — E1169 Type 2 diabetes mellitus with other specified complication: Secondary | ICD-10-CM

## 2022-07-12 DIAGNOSIS — E538 Deficiency of other specified B group vitamins: Secondary | ICD-10-CM | POA: Diagnosis not present

## 2022-07-12 DIAGNOSIS — E669 Obesity, unspecified: Secondary | ICD-10-CM

## 2022-07-12 DIAGNOSIS — E7800 Pure hypercholesterolemia, unspecified: Secondary | ICD-10-CM | POA: Diagnosis not present

## 2022-07-12 DIAGNOSIS — E1165 Type 2 diabetes mellitus with hyperglycemia: Secondary | ICD-10-CM | POA: Diagnosis not present

## 2022-07-12 LAB — COMPREHENSIVE METABOLIC PANEL
ALT: 31 U/L (ref 0–53)
AST: 30 U/L (ref 0–37)
Albumin: 4.3 g/dL (ref 3.5–5.2)
Alkaline Phosphatase: 41 U/L (ref 39–117)
BUN: 14 mg/dL (ref 6–23)
CO2: 29 mEq/L (ref 19–32)
Calcium: 9.7 mg/dL (ref 8.4–10.5)
Chloride: 104 mEq/L (ref 96–112)
Creatinine, Ser: 0.71 mg/dL (ref 0.40–1.50)
GFR: 93.74 mL/min (ref >60.00)
Glucose, Bld: 145 mg/dL — ABNORMAL HIGH (ref 70–99)
Potassium: 4.1 mEq/L (ref 3.5–5.1)
Sodium: 141 mEq/L (ref 135–145)
Total Bilirubin: 0.5 mg/dL (ref 0.2–1.2)
Total Protein: 6.5 g/dL (ref 6.0–8.3)

## 2022-07-12 LAB — POCT GLYCOSYLATED HEMOGLOBIN (HGB A1C): Hemoglobin A1C: 6.1 % — AB (ref 4.0–5.6)

## 2022-07-12 LAB — POCT GLUCOSE (DEVICE FOR HOME USE): POC Glucose: 142 mg/dl — AB (ref 70–99)

## 2022-07-12 LAB — VITAMIN B12: Vitamin B-12: 537 pg/mL (ref 211–911)

## 2022-07-12 LAB — LIPID PANEL
Cholesterol: 106 mg/dL (ref 0–200)
HDL: 34.5 mg/dL — ABNORMAL LOW (ref >39.00)
LDL Cholesterol: 39 mg/dL (ref 0–99)
NonHDL: 71.67
Total CHOL/HDL Ratio: 3
Triglycerides: 165 mg/dL — ABNORMAL HIGH (ref 0.0–149.0)
VLDL: 33 mg/dL (ref 0.0–40.0)

## 2022-07-13 ENCOUNTER — Other Ambulatory Visit: Payer: Self-pay | Admitting: Endocrinology

## 2022-07-26 ENCOUNTER — Other Ambulatory Visit: Payer: Self-pay | Admitting: Endocrinology

## 2022-07-26 DIAGNOSIS — E1165 Type 2 diabetes mellitus with hyperglycemia: Secondary | ICD-10-CM

## 2022-08-12 LAB — HM DIABETES EYE EXAM

## 2022-08-15 ENCOUNTER — Other Ambulatory Visit: Payer: Self-pay

## 2022-08-15 DIAGNOSIS — E1165 Type 2 diabetes mellitus with hyperglycemia: Secondary | ICD-10-CM

## 2022-08-15 MED ORDER — MOUNJARO 5 MG/0.5ML ~~LOC~~ SOAJ: SUBCUTANEOUS | 5 refills | Status: DC

## 2022-08-18 ENCOUNTER — Encounter: Payer: Self-pay | Admitting: Endocrinology

## 2022-08-18 LAB — HM DIABETES EYE EXAM

## 2022-10-11 ENCOUNTER — Encounter: Payer: Self-pay | Admitting: Family Medicine

## 2022-10-18 NOTE — Progress Notes (Signed)
ACUTE VISIT Chief Complaint  Patient presents with   Tick Removal   HPI: [name] is a 72 y.o. male with past medical history significant for DM 2, hypertension, hyperlipidemia, and RA here today with concerns about multiple tick bites on his LLE, which he noticed and removed, he believes ticks where not on him for more than 24 hours. There were not imbedded or engorged. He reports removing three ticks at different times, with the last one being a few days ago.  Mild pruritus and irritation on bite sides, for which he applied alcohol, and he mentions that the areas are now healing.   He reports a history of several tick bites from the previous year and has concerns about the possibility of Lyme disease, would like to be screened.  He is also requesting blood work done today.  RA on Methotrexate. He follows with rheumatologist every few months, he would like to added to his blood work today: CMP, CRP, and CBC.  Additionally, Raymond Vasquez mentions that follows with orthopedist regularly for trigger fingers.  Review of Systems  Constitutional:  Negative for activity change, appetite change and fever.  HENT:  Negative for mouth sores and sore throat.   Respiratory:  Negative for cough and shortness of breath.   Cardiovascular:  Negative for chest pain, palpitations and leg swelling.  Gastrointestinal:  Negative for abdominal pain, nausea and vomiting.  Musculoskeletal:  Positive for arthralgias. Negative for gait problem and myalgias.  Skin:  Negative for wound.  Neurological:  Negative for syncope, weakness and numbness.  See other pertinent positives and negatives in HPI.  Current Outpatient Medications on File Prior to Visit  Medication Sig Dispense Refill   Cyanocobalamin (VITAMIN B 12 PO) Take 1 tablet by mouth daily at 6 (six) AM.     Cyanocobalamin (VITAMIN B-12 PO) Take by mouth daily.     diclofenac (VOLTAREN) 75 MG EC tablet Take 75 mg by mouth daily.     folic acid  (FOLVITE) 1 MG tablet Take 1 mg by mouth daily.     glimepiride (AMARYL) 4 MG tablet TAKE 1/2 TABLET BY MOUTH EVERY DAY WITH DINNER 90 tablet 2   glucose blood test strip OneTouch Verio test strips  USE TO CHECK BLOOD SUGAR ONCE DAILY ALTERNATING FASTING AND 2 HOURS AFTER A MEAL     JARDIANCE 25 MG TABS tablet TAKE 1 TABLET(25 MG) BY MOUTH DAILY 30 tablet 2   metFORMIN (GLUCOPHAGE) 1000 MG tablet TAKE 1 TABLET BY MOUTH TWICE DAILY WITH MEAL 180 tablet 1   Methotrexate Sodium (METHOTREXATE, PF,) 50 MG/2ML injection INJECT 1 ML ONCE A WEEK     nystatin cream (MYCOSTATIN) Apply 1 application topically 2 (two) times daily. 30 g 0   ONETOUCH DELICA LANCETS FINE MISC Use as instructed by provider 200 each 3   ONETOUCH VERIO test strip USE TO CHECK BLOOD SUGAR ONCE DAILY ALTERNATING FASTING AND 2 HOURS AFTER A MEAL 50 strip 2   rosuvastatin (CRESTOR) 20 MG tablet TAKE 1 TABLET(20 MG) BY MOUTH DAILY 90 tablet 1   tirzepatide (MOUNJARO) 5 MG/0.5ML Pen ADMINISTER 5MG  INTO THE SKIN ONCE WEEKLY 2 mL 5   No current facility-administered medications on file prior to visit.    Past Medical History:  Diagnosis Date   Allergy    SEASONAL   Angioedema    he is unsure of this or cause   Arthritis    RIGHT shoulder   Balanitis 04/12/2013   Diabetes mellitus  type II, controlled, with no complications ([facility])    on meds   Hemorrhoids    History of laparoscopic adjustable gastric banding, 08/10/2009 07/05/2013   Hyperlipidemia    on meds   Hypertension    Nephrolithiasis    1980s   OSA (obstructive sleep apnea)    PMR (polymyalgia rheumatica) ([facility])    Seizures ([facility])    03/2021   Shingles    2014   No Known Allergies  Social History   Socioeconomic History   Marital status: Married    Spouse name: Not on file   Number of children: Not on file   Years of education: Not on file   Highest education level: Not on file  Occupational History   Occupation: retired  Tobacco Use   Smoking status:  Some Days    Types: Cigars    Passive exposure: Current (WIFE SMOKES)   Smokeless tobacco: Current    Types: Chew   Tobacco comments:    1 cigar occassionally, not more then 1x per week  Vaping Use   Vaping Use: Never used  Substance and Sexual Activity   Alcohol use: Yes    Comment: rarely   Drug use: No   Sexual activity: Not on file  Other Topics Concern   Not on file  Social History Narrative   Work or School: retired Therapist, 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[place] [employer]       Home Situation: lives with wifes      Spiritual Beliefs: none      Lifestyle: no regular exercise, healthy diet      Right handed    Social Determinants of Health   Financial Resource Strain: Low Risk  (03/21/2022)   Overall Financial Resource Strain (CARDIA)    Difficulty of Paying Living Expenses: Not hard at all  Food Insecurity: No Food Insecurity (03/21/2022)   Hunger Vital Sign    Worried About Running Out of Food in the Last Year: Never true    Ran Out of Food in the Last Year: Never true  Transportation Needs: No Transportation Needs (03/21/2022)   PRAPARE - Administrator, Civil Service (Medical): No    Lack of Transportation (Non-Medical): No  Physical Activity: Inactive (03/21/2022)   Exercise Vital Sign    Days of Exercise per Week: 0 days    Minutes of Exercise per Session: 0 min  Stress: No Stress Concern Present (03/21/2022)   [employer] of Occupational Health - Occupational Stress Questionnaire    Feeling of Stress : Not at all  Social Connections: Moderately Isolated (03/21/2022)   Social Connection and Isolation Panel [NHANES]    Frequency of Communication with Friends and Family: More than three times a week    Frequency of Social Gatherings with Friends and Family: More than three times a week    Attends Religious Services: Never    Database administrator or Organizations: No    Attends Banker Meetings: Never    Marital Status: Married   Vitals:    10/19/22 0919  BP: 120/70  Pulse: 70  Resp: 16  Temp: 97.8 F (36.6 C)  SpO2: 98%   Body mass index is 28.65 kg/m.  Physical Exam Vitals and nursing note reviewed.  Constitutional:      General: He is not in acute distress.    Appearance: He is well-developed.  HENT:     Head: Normocephalic and atraumatic.  Eyes:     Conjunctiva/sclera: Conjunctivae normal.  Cardiovascular:     Rate and Rhythm: Normal rate and regular rhythm.  Pulmonary:     Effort: Pulmonary effort is normal. No respiratory distress.     Breath sounds: Normal breath sounds.  Musculoskeletal:     Comments: No signs of synovitis.  Lymphadenopathy:     Cervical: No cervical adenopathy.  Skin:    General: Skin is warm.     Findings: Rash present. No erythema.     Comments: On posterior aspect of left thigh 2 papular lesions, mildly erythematous, not tender or indurated with 1-2 mm central crust.  Neurological:     General: No focal deficit present.     Mental Status: He is alert and oriented to person, place, and time.  Psychiatric:        Mood and Affect: Mood and affect normal.   ASSESSMENT AND PLAN:  Mr. Heffington was seen today for hx of multiple tick bites. Lab Results  Component Value Date   CREATININE 0.82 10/19/2022   BUN 12 10/19/2022   NA 141 10/19/2022   K 4.4 10/19/2022   CL 103 10/19/2022   CO2 31 10/19/2022   Lab Results  Component Value Date   ALT 27 10/19/2022   AST 32 10/19/2022   ALKPHOS 48 10/19/2022   BILITOT 0.5 10/19/2022   Lab Results  Component Value Date   CRP <1.0 10/19/2022   Tick bite of left lower leg, initial encounter Last tick he found on left LE was a few days ago, was not imbedded or engorged and he thinks he did not have them om for more than 24 hours, so the probability of lyme disease is low. Lyme disease test ordered today. Monitor for new symptoms.  -     B. burgdorfi antibodies  Medication monitoring encounter -     B. burgdorfi antibodies -      CBC; Future -     Comprehensive metabolic panel; Future  Rheumatoid arthritis involving multiple sites, unspecified whether rheumatoid factor present [facility]) Follows with rheumatologist, he is on Methotrexate. Labs ordered as requested.  -     B. burgdorfi antibodies -     CBC; Future -     Comprehensive metabolic panel; Future -     C-reactive protein; Future  Return if symptoms worsen or fail to improve.  Kentley Cedillo G. [place], MD  [facility]. [place] office.

## 2022-10-19 ENCOUNTER — Encounter: Payer: Self-pay | Admitting: Family Medicine

## 2022-10-19 ENCOUNTER — Ambulatory Visit (INDEPENDENT_AMBULATORY_CARE_PROVIDER_SITE_OTHER): Payer: Medicare Other | Admitting: Family Medicine

## 2022-10-19 VITALS — BP 120/70 | HR 70 | Temp 97.8°F | Resp 16 | Ht 69.0 in | Wt 194.0 lb

## 2022-10-19 DIAGNOSIS — M069 Rheumatoid arthritis, unspecified: Secondary | ICD-10-CM | POA: Diagnosis not present

## 2022-10-19 DIAGNOSIS — W57XXXA Bitten or stung by nonvenomous insect and other nonvenomous arthropods, initial encounter: Secondary | ICD-10-CM | POA: Diagnosis not present

## 2022-10-19 DIAGNOSIS — S80862A Insect bite (nonvenomous), left lower leg, initial encounter: Secondary | ICD-10-CM | POA: Diagnosis not present

## 2022-10-19 DIAGNOSIS — Z5181 Encounter for therapeutic drug level monitoring: Secondary | ICD-10-CM

## 2022-10-19 LAB — COMPREHENSIVE METABOLIC PANEL
ALT: 27 U/L (ref 0–53)
AST: 32 U/L (ref 0–37)
Albumin: 4.2 g/dL (ref 3.5–5.2)
Alkaline Phosphatase: 48 U/L (ref 39–117)
BUN: 12 mg/dL (ref 6–23)
CO2: 31 mEq/L (ref 19–32)
Calcium: 9.6 mg/dL (ref 8.4–10.5)
Chloride: 103 mEq/L (ref 96–112)
Creatinine, Ser: 0.82 mg/dL (ref 0.40–1.50)
GFR: 89.58 mL/min (ref >60.00)
Glucose, Bld: 202 mg/dL — ABNORMAL HIGH (ref 70–99)
Potassium: 4.4 mEq/L (ref 3.5–5.1)
Sodium: 141 mEq/L (ref 135–145)
Total Bilirubin: 0.5 mg/dL (ref 0.2–1.2)
Total Protein: 6.6 g/dL (ref 6.0–8.3)

## 2022-10-19 LAB — CBC
HCT: 45.3 % (ref 39.0–52.0)
Hemoglobin: 14.5 g/dL (ref 13.0–17.0)
MCHC: 31.9 g/dL (ref 30.0–36.0)
MCV: 91.2 fl (ref 78.0–100.0)
Platelets: 229 10*3/uL (ref 150.0–400.0)
RBC: 4.96 Mil/uL (ref 4.22–5.81)
RDW: 15.7 % — ABNORMAL HIGH (ref 11.5–15.5)
WBC: 4.5 10*3/uL (ref 4.0–10.5)

## 2022-10-19 LAB — C-REACTIVE PROTEIN: CRP: <1 mg/dL (ref 0.5–20.0)

## 2022-10-19 NOTE — Patient Instructions (Addendum)
A few things to remember from today's visit:  Medication monitoring encounter - Plan: B. burgdorfi Antibody, CBC, Comprehensive metabolic panel  Rheumatoid arthritis involving multiple sites, unspecified whether rheumatoid factor present ([facility]) - Plan: B. burgdorfi Antibody, CBC, Comprehensive metabolic panel, C-reactive protein  Tick bite of left lower leg, initial encounter - Plan: B. burgdorfi Antibody  Do not use My Chart to request refills or for acute issues that need immediate attention. If you send a my chart message, it may take a few days to be addressed, specially if I am not in the office.  Please be sure medication list is accurate. If a new problem present, please set up appointment sooner than planned today.

## 2022-10-20 LAB — B. BURGDORFI ANTIBODIES: B burgdorferi Ab IgG+IgM: <0.9 index

## 2022-10-23 ENCOUNTER — Other Ambulatory Visit: Payer: Self-pay | Admitting: Endocrinology

## 2022-10-23 DIAGNOSIS — E1165 Type 2 diabetes mellitus with hyperglycemia: Secondary | ICD-10-CM

## 2022-11-07 ENCOUNTER — Other Ambulatory Visit: Payer: Self-pay | Admitting: Endocrinology

## 2022-11-07 ENCOUNTER — Other Ambulatory Visit: Payer: Medicare Other

## 2022-11-07 DIAGNOSIS — E1165 Type 2 diabetes mellitus with hyperglycemia: Secondary | ICD-10-CM

## 2022-11-08 ENCOUNTER — Other Ambulatory Visit (INDEPENDENT_AMBULATORY_CARE_PROVIDER_SITE_OTHER): Payer: Medicare Other

## 2022-11-08 DIAGNOSIS — E1165 Type 2 diabetes mellitus with hyperglycemia: Secondary | ICD-10-CM

## 2022-11-08 LAB — GLUCOSE, RANDOM: Glucose, Bld: 177 mg/dL — ABNORMAL HIGH (ref 70–99)

## 2022-11-08 LAB — HEMOGLOBIN A1C: Hgb A1c MFr Bld: 6.7 % — ABNORMAL HIGH (ref 4.6–6.5)

## 2022-11-09 NOTE — Progress Notes (Unsigned)
Patient ID: Raymond Vasquez, male   DOB: 10/19/1950, 72 y.o.   MRN: [id number]          Reason for Appointment: Follow-up for Type 2 Diabetes    History of Present Illness:          Date of diagnosis of type 2 diabetes mellitus: 2003 approximately       Recent history:    Non-insulin hypoglycemic drugs : Metformin 1 g a.m., p.m., Amaryl 2 mg p.m., Mounjaro 5  mg weekly, Jardiance 25 mg daily  Current management, blood sugar patterns and problems identified: He has relatively better A1c of 6.1  Previously as high as 7.7  Not clear how his A1c has improved even though he is on the same regimen including Mounjaro since 11/22 He is only checking his blood sugars in the mornings and these appear to be similar to before Also has not had any significant weight change Partly because of joint pains he does not do any exercise and only does some yard work and warm weather Lab glucose was 145 after coughing No side effects from Ruffin No symptoms of low blood sugars during the day       Side effects from medications have been: None    Typical meal intake: Breakfast is usually skipped, sometimes juice and muffin.  Using smaller portions of meats now            Glucose monitoring:  done 1 times a day         Glucometer: One Touch Verio.       Blood Glucose readings from recall:     PRE-MEAL Fasting Lunch Dinner Bedtime Overall  Glucose range: 90-130    ?  Averages:        POST-MEAL PC Breakfast PC Lunch PC Dinner  Glucose range:     Averages:      Previously: AVERAGE 123 Recent range 79-162 and 103 at lunchtime   Dietician visit, most recent: Before his lap band surgery  Weight history: Maximum 280 in the past  Wt Readings from Last 3 Encounters:  10/19/22 194 lb (88 kg)  07/12/22 193 lb 12.8 oz (87.9 kg)  04/07/22 195 lb 1.6 oz (88.5 kg)    Glycemic control:   Lab Results  Component Value Date   HGBA1C 6.7 (H) 11/08/2022   HGBA1C 6.1 (A) 07/12/2022   HGBA1C 6.7  (H) 03/04/2022   Lab Results  Component Value Date   MICROALBUR 0.9 03/04/2022   LDLCALC 39 07/12/2022   CREATININE 0.82 10/19/2022   Lab Results  Component Value Date   MICRALBCREAT 2.1 03/04/2022    Lab Results  Component Value Date   FRUCTOSAMINE 263 03/07/2019   FRUCTOSAMINE 281 03/22/2018    Lab on 11/08/2022  Component Date Value Ref Range Status   Glucose, Bld 11/08/2022 177 (H)  70 - 99 mg/dL Final   Hgb N8G MFr Bld 11/08/2022 6.7 (H)  4.6 - 6.5 % Final   Glycemic Control Guidelines for People with Diabetes:Non Diabetic:  <6%Goal of Therapy: <7%Additional Action Suggested:  >8%     Allergies as of 11/10/2022   No Known Allergies      Medication List        Accurate as of November 09, 2022  8:28 PM. If you have any questions, ask your nurse or doctor.          diclofenac 75 MG EC tablet Commonly known as: VOLTAREN Take 75 mg by mouth daily.  folic acid 1 MG tablet Commonly known as: FOLVITE Take 1 mg by mouth daily.   glimepiride 4 MG tablet Commonly known as: AMARYL TAKE 1/2 TABLET BY MOUTH EVERY DAY WITH DINNER   glucose blood test strip OneTouch Verio test strips  USE TO CHECK BLOOD SUGAR ONCE DAILY ALTERNATING FASTING AND 2 HOURS AFTER A MEAL   OneTouch Verio test strip Generic drug: glucose blood USE TO CHECK BLOOD SUGAR ONCE DAILY ALTERNATING FASTING AND 2 HOURS AFTER A MEAL   Jardiance 25 MG Tabs tablet Generic drug: empagliflozin TAKE 1 TABLET(25 MG) BY MOUTH DAILY   metFORMIN 1000 MG tablet Commonly known as: GLUCOPHAGE TAKE 1 TABLET BY MOUTH TWICE DAILY WITH MEAL   methotrexate (PF) 50 MG/2ML injection INJECT 1 ML ONCE A WEEK   Mounjaro 5 MG/0.5ML Pen Generic drug: tirzepatide ADMINISTER 5MG  INTO THE SKIN ONCE WEEKLY   nystatin cream Commonly known as: MYCOSTATIN Apply 1 application topically 2 (two) times daily.   OneTouch Delica Lancets Fine Misc Use as instructed by provider   rosuvastatin 20 MG tablet Commonly known  as: CRESTOR TAKE 1 TABLET(20 MG) BY MOUTH DAILY   VITAMIN B 12 PO Take 1 tablet by mouth daily at 6 (six) AM.   VITAMIN B-12 PO Take by mouth daily.        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 07/05/2013   Hyperlipidemia    on meds   Hypertension    Nephrolithiasis    1980s   OSA (obstructive sleep apnea)    PMR (polymyalgia rheumatica) (HCC)    Seizures (HCC)    03/2021   Shingles    2014    Past Surgical History:  Procedure Laterality Date   APPENDECTOMY     COLONOSCOPY  2019   JMP-MAC-2 day mira/sup(good with lavage)-polyps   FINGER SURGERY  06/2012   LAPAROSCOPIC GASTRIC BANDING  08/10/2009   POLYPECTOMY  2019   polyps   ROTATOR CUFF REPAIR Right 06/2019   Dr Thurston Hole   TONSILLECTOMY  1959    Family History  Problem Relation Age of Onset   Heart disease Mother    Diabetes Maternal Grandmother    Colon polyps Neg Hx    Colon cancer Neg Hx    Esophageal cancer Neg Hx    Rectal cancer Neg Hx    Stomach cancer Neg Hx    Crohn's disease Neg Hx     Social History:  reports that he has been smoking cigars. He has been exposed to tobacco smoke. His smokeless tobacco use includes chew. He reports current alcohol use. He reports that he does not use drugs.   Review of Systems   Lipid history: Followed by PCP, has had adequate levels on Crestor 20 mg daily    Lab Results  Component Value Date   CHOL 106 07/12/2022   HDL 34.50 (L) 07/12/2022   LDLCALC 39 07/12/2022   LDLDIRECT 154.1 02/16/2010   TRIG 165.0 (H) 07/12/2022   CHOLHDL 3 07/12/2022           Blood pressure history:  Has not been treated for hypertension He  does take  Jardiance  BP Readings from Last 3 Encounters:  10/19/22 120/70  07/12/22 114/78  04/07/22 100/62    Last eye exam in 2022 negative for retinopathy+  Most recent foot exam: 2/23  Currently known complications of diabetes: none, no symptoms of neuropathy  He is on methotrexate injections for his rheumatoid arthritis  B12 level was improved after starting supplements  Recently he is still taking B12 tablets but has not had any monitoring of B12 level   LABS:  Lab on 11/08/2022  Component Date Value Ref Range Status   Glucose, Bld 11/08/2022 177 (H)  70 - 99 mg/dL Final   Hgb N8G MFr Bld 11/08/2022 6.7 (H)  4.6 - 6.5 % Final   Glycemic Control Guidelines for People with Diabetes:Non Diabetic:  <6%Goal of Therapy: <7%Additional Action Suggested:  >8%     Physical Examination:  There were no vitals taken for this visit.  Diabetic Foot Exam - Simple   No data filed       ASSESSMENT:  Diabetes type 2 non-insulin-dependent, BMI under 30  See history of present illness for detailed discussion of current diabetes management, blood sugar patterns and problems identified  His A1c is 6.1  Diabetes has been controlled with Jardiance 25 mg,, Amaryl 2 mg, Mounjaro 5  mg and Metformin  His blood sugar levels in the mornings are about the same as before although he did not bring his monitor He still does not check readings after meals May have better control overall because of improved diet although his weight is still about the same Continues to need 4 types of diabetes medications and no hypoglycemia also   PLAN:    Continue 5 mg Mounjaro weekly Reminded him to check blood sugars after meals Regular exercise as tolerated  He will need to get reports from his upcoming follow-up eye exam  Will need follow-up lipids and B12 level  There are no Patient Instructions on file for this visit.        Reather Littler 11/09/2022, 8:28 PM   Note: This office note was prepared  with Dragon voice recognition system technology. Any transcriptional errors that result from this process are unintentional.  Addendum: B12 normal, triglycerides 165 otherwise lipids controlled

## 2022-11-10 ENCOUNTER — Ambulatory Visit (INDEPENDENT_AMBULATORY_CARE_PROVIDER_SITE_OTHER): Payer: Medicare Other | Admitting: Endocrinology

## 2022-11-10 ENCOUNTER — Encounter: Payer: Self-pay | Admitting: Endocrinology

## 2022-11-10 VITALS — BP 120/70 | HR 73 | Ht 69.0 in | Wt 195.0 lb

## 2022-11-10 DIAGNOSIS — E1165 Type 2 diabetes mellitus with hyperglycemia: Secondary | ICD-10-CM | POA: Diagnosis not present

## 2022-11-10 DIAGNOSIS — Z7985 Long-term (current) use of injectable non-insulin antidiabetic drugs: Secondary | ICD-10-CM | POA: Diagnosis not present

## 2022-11-10 DIAGNOSIS — Z7984 Long term (current) use of oral hypoglycemic drugs: Secondary | ICD-10-CM | POA: Diagnosis not present

## 2022-11-10 DIAGNOSIS — E782 Mixed hyperlipidemia: Secondary | ICD-10-CM | POA: Diagnosis not present

## 2022-11-10 DIAGNOSIS — E1169 Type 2 diabetes mellitus with other specified complication: Secondary | ICD-10-CM

## 2022-11-10 MED ORDER — TIRZEPATIDE 7.5 MG/0.5ML ~~LOC~~ SOAJ: 7.5000 mg | SUBCUTANEOUS | 2 refills | Status: DC

## 2022-11-10 NOTE — Patient Instructions (Signed)
Check blood sugars on waking up 3 days a week  Also check blood sugars about 2 hours after meals and do this after different meals by rotation  Recommended blood sugar levels on waking up are 90-130 and about 2 hours after meal is 130-160  Please bring your blood sugar monitor to each visit, thank you   

## 2022-12-02 ENCOUNTER — Telehealth: Payer: Self-pay

## 2022-12-02 ENCOUNTER — Other Ambulatory Visit: Payer: Self-pay

## 2022-12-02 DIAGNOSIS — R030 Elevated blood-pressure reading, without diagnosis of hypertension: Secondary | ICD-10-CM

## 2022-12-02 MED ORDER — METFORMIN HCL 1000 MG PO TABS
1000.0000 mg | ORAL_TABLET | Freq: Two times a day (BID) | ORAL | 2 refills | Status: DC
Start: 2022-12-02 — End: 2023-05-25

## 2022-12-02 NOTE — Telephone Encounter (Signed)
Patient called in to receive a refill on metformin. Rx was sent to [facility] on file after verifying with patient

## 2022-12-22 ENCOUNTER — Other Ambulatory Visit: Payer: Self-pay | Admitting: *Deleted

## 2022-12-30 ENCOUNTER — Other Ambulatory Visit: Payer: Self-pay | Admitting: Family Medicine

## 2023-01-26 ENCOUNTER — Other Ambulatory Visit: Payer: Self-pay

## 2023-01-26 ENCOUNTER — Telehealth: Payer: Self-pay | Admitting: Endocrinology

## 2023-01-26 DIAGNOSIS — Z794 Long term (current) use of insulin: Secondary | ICD-10-CM

## 2023-01-26 MED ORDER — ONETOUCH VERIO VI STRP
ORAL_STRIP | 2 refills | Status: DC
Start: 2023-01-26 — End: 2023-06-19

## 2023-01-26 NOTE — Telephone Encounter (Signed)
MEDICATION: OneTouch Verio ONETOUCH VERIO test strip  PHARMACY:    HAS THE PATIENT CONTACTED THEIR PHARMACY?  [facility] [phone] - [place], [place] - [address] AT [facility] [address] (Ph: [phone])   IS THIS A 90 DAY SUPPLY : Yes  IS PATIENT OUT OF MEDICATION: Yes  IF NOT; HOW MUCH IS LEFT: Yes  LAST APPOINTMENT DATE: @6 /20/2024  NEXT APPOINTMENT DATE:@Visit  date not found  DO WE HAVE YOUR PERMISSION TO LEAVE A DETAILED MESSAGE?: Yes  OTHER COMMENTS:    **Let patient know to contact pharmacy at the end of the day to make sure medication is ready. **  ** Please notify patient to allow 48-72 hours to process**  **Encourage patient to contact the pharmacy for refills or they can request refills through [facility]**

## 2023-01-26 NOTE — Telephone Encounter (Signed)
MEDICATION: OneTouch Verio ONETOUCH VERIO test strip   PHARMACY:     HAS THE PATIENT CONTACTED THEIR PHARMACY?  [facility] [phone] - [place], [place] - [address] AT [facility] OF [address] (Ph: [phone])    Test Strips has been sent to [facility]

## 2023-01-27 ENCOUNTER — Encounter: Payer: Self-pay | Admitting: Family Medicine

## 2023-01-27 ENCOUNTER — Ambulatory Visit (INDEPENDENT_AMBULATORY_CARE_PROVIDER_SITE_OTHER): Payer: Medicare Other | Admitting: Family Medicine

## 2023-01-27 VITALS — BP 110/72 | HR 96 | Temp 98.9°F | Ht 69.0 in | Wt 187.0 lb

## 2023-01-27 DIAGNOSIS — A6920 Lyme disease, unspecified: Secondary | ICD-10-CM | POA: Diagnosis not present

## 2023-01-27 DIAGNOSIS — S70362A Insect bite (nonvenomous), left thigh, initial encounter: Secondary | ICD-10-CM | POA: Diagnosis not present

## 2023-01-27 DIAGNOSIS — B3742 Candidal balanitis: Secondary | ICD-10-CM | POA: Diagnosis not present

## 2023-01-27 DIAGNOSIS — W57XXXA Bitten or stung by nonvenomous insect and other nonvenomous arthropods, initial encounter: Secondary | ICD-10-CM

## 2023-01-27 MED ORDER — FLUCONAZOLE 150 MG PO TABS: ORAL_TABLET | ORAL | 0 refills | Status: DC

## 2023-01-27 MED ORDER — DOXYCYCLINE HYCLATE 100 MG PO TABS
100.0000 mg | ORAL_TABLET | Freq: Two times a day (BID) | ORAL | 0 refills | Status: AC
Start: 2023-01-27 — End: 2023-02-06

## 2023-01-27 MED ORDER — KETOCONAZOLE 2 % EX CREA
1.0000 | TOPICAL_CREAM | Freq: Every day | CUTANEOUS | 0 refills | Status: AC
Start: 2023-01-27 — End: ?

## 2023-01-27 NOTE — Progress Notes (Signed)
Established Patient Office Visit   Subjective  Patient ID: Raymond Vasquez, male    DOB: 03-05-1951  Age: 72 y.o. MRN: [id number]  Chief Complaint  Patient presents with   Vaginitis    Started 3 weeks ago, patient states that a medication that he is taking causes the yeast, patient also has a red rash on the left upper leg,     Patient is a 72 year old male followed by Dr. Casimiro Needle and seen for acute concern.  Patient endorses, small cuts in skin for skin, and edema times a few weeks.  Patient states symptoms are similar to when he has had a yeast infection in the past.  Patient states medication Jardiance typically causes 1/year.  Hesitant to switch to a new med as BS well-controlled.  Started using nystatin cream without relief.  Patient denies discharge, urinary frequency, urinary hesitancy, rash on penis.  Patient also notes rash on left upper thigh x 3 wks.  Unsure if bitten by an insect, but states he is a "magnet for ticks".    Past Medical History:  Diagnosis Date   Allergy    SEASONAL   Angioedema    he is unsure of this or cause   Arthritis    RIGHT shoulder   Balanitis 04/12/2013   Diabetes mellitus type II, controlled, with no complications (HCC)    on meds   Hemorrhoids    History of laparoscopic adjustable gastric banding, 08/10/2009 07/05/2013   Hyperlipidemia    on meds   Hypertension    Nephrolithiasis    1980s   OSA (obstructive sleep apnea)    PMR (polymyalgia rheumatica) (HCC)    Seizures (HCC)    03/2021   Shingles    2014   Past Surgical History:  Procedure Laterality Date   APPENDECTOMY     COLONOSCOPY  2019   JMP-MAC-2 day mira/sup(good with lavage)-polyps   FINGER SURGERY  06/2012   LAPAROSCOPIC GASTRIC BANDING  08/10/2009   POLYPECTOMY  2019   polyps   ROTATOR CUFF REPAIR Right 06/2019   Dr Thurston Hole   TONSILLECTOMY  1959   Social History   Tobacco Use   Smoking status: Some Days    Types: Cigars    Passive exposure: Current (WIFE  SMOKES)   Smokeless tobacco: Current    Types: Chew   Tobacco comments:    1 cigar occassionally, not more then 1x per week  Vaping Use   Vaping status: Never Used  Substance Use Topics   Alcohol use: Yes    Comment: rarely   Drug use: No   Family History  Problem Relation Age of Onset   Heart disease Mother    Diabetes Maternal Grandmother    Colon polyps Neg Hx    Colon cancer Neg Hx    Esophageal cancer Neg Hx    Rectal cancer Neg Hx    Stomach cancer Neg Hx    Crohn's disease Neg Hx    No Known Allergies    ROS Negative unless stated above    Objective:     BP 110/72 (BP Location: Left Arm, Patient Position: Sitting, Cuff Size: Normal)   Pulse 96   Temp 98.9 F (37.2 C) (Oral)   Ht 5\' 9"  (1.753 m)   Wt 187 lb (84.8 kg)   SpO2 96%   BMI 27.62 kg/m    Physical Exam Constitutional:      General: He is not in acute distress.    Appearance: Normal appearance.  HENT:     Head: Normocephalic and atraumatic.     Nose: Nose normal.     Mouth/Throat:     Mouth: Mucous membranes are moist.  Cardiovascular:     Rate and Rhythm: Normal rate and regular rhythm.  Pulmonary:     Effort: Pulmonary effort is normal.  Skin:    General: Skin is warm and dry.     Comments: Left upper thigh with erythematous circular lesion with central punctum, surrounded by an area of clearing and an outer ring of erythema and slightly raised red papules.  Neurological:     Mental Status: He is alert and oriented to person, place, and time.      No results found for any visits on 01/27/23.    Assessment & Plan:  Candidal balanitis -     Fluconazole; Take 1 tab now.  Repeat dose in 3 days for continued symptoms.  Dispense: 2 tablet; Refill: 0 -     Ketoconazole; Apply 1 Application topically daily.  Dispense: 15 g; Refill: 0  Tick bite of left thigh, initial encounter -     Doxycycline Hyclate; Take 1 tablet (100 mg total) by mouth 2 (two) times daily for 10 days.  Dispense:  20 tablet; Refill: 0  Erythema migrans -     Doxycycline Hyclate; Take 1 tablet (100 mg total) by mouth 2 (two) times daily for 10 days.  Dispense: 20 tablet; Refill: 0  Patient seen for several ongoing concerns.  Will start Diflucan and ketoconazole cream for Candida Ballin Titus likely 2/2 Jardiance.  Patient advised to consider switching to a different medication given frequent episodes of candidal infections.  Will discuss with Endo.  Concern for tick bite given erythema migrans on left upper thigh.  Start doxycycline twice daily x 10 days.  Given strict precautions.  Follow-up with PCP as needed  Return if symptoms worsen or fail to improve.   Deeann Saint, MD

## 2023-02-02 ENCOUNTER — Telehealth: Payer: Self-pay | Admitting: Endocrinology

## 2023-02-02 DIAGNOSIS — E1165 Type 2 diabetes mellitus with hyperglycemia: Secondary | ICD-10-CM

## 2023-02-02 MED ORDER — EMPAGLIFLOZIN 25 MG PO TABS: 25.0000 mg | ORAL_TABLET | Freq: Every day | ORAL | 2 refills | Status: DC

## 2023-02-02 NOTE — Telephone Encounter (Signed)
Prescription has been sent to [facility]. I attempted to contact patient and there was no answer or VM.

## 2023-02-02 NOTE — Telephone Encounter (Signed)
Patient states he is having some issues with Jardiance and also waiting on a rx approval for same. Patient was a patient of Dr. Lucianne Muss. Please call

## 2023-02-14 ENCOUNTER — Telehealth: Payer: Self-pay | Admitting: Endocrinology

## 2023-02-14 MED ORDER — GLIMEPIRIDE 4 MG PO TABS: ORAL_TABLET | ORAL | 1 refills | Status: DC

## 2023-02-14 NOTE — Telephone Encounter (Signed)
MEDICATION: glimepiride glimepiride (AMARYL) 4 MG tablet  PHARMACY:    [facility] [phone] - [place], [place] - [address] AT [facility] OF [address] (Ph: [phone])    HAS THE PATIENT CONTACTED THEIR PHARMACY?  Yes  IS THIS A 90 DAY SUPPLY : Yes  IS PATIENT OUT OF MEDICATION: No  IF NOT; HOW MUCH IS LEFT: 7 days  LAST APPOINTMENT DATE: @ 11/10/2022  NEXT APPOINTMENT DATE:@12 /17/2024  DO WE HAVE YOUR PERMISSION TO LEAVE A DETAILED MESSAGE?: Yes  OTHER COMMENTS: Patient states that he has called previously & that [facility] told him that they have called as well.   **Let patient know to contact pharmacy at the end of the day to make sure medication is ready. **  ** Please notify patient to allow 48-72 hours to process**  **Encourage patient to contact the pharmacy for refills or they can request refills through [facility]**

## 2023-02-14 NOTE — Telephone Encounter (Signed)
Patient aware.

## 2023-02-21 ENCOUNTER — Telehealth: Payer: Self-pay | Admitting: Family Medicine

## 2023-02-21 DIAGNOSIS — B3742 Candidal balanitis: Secondary | ICD-10-CM

## 2023-02-21 MED ORDER — KETOCONAZOLE 2 % EX CREA: 1.0000 | TOPICAL_CREAM | Freq: Every day | CUTANEOUS | 2 refills | Status: AC

## 2023-02-21 NOTE — Telephone Encounter (Signed)
Patient is taking a medication for diabetes that is causing a yeast infection.  At his last visit, the provider he saw gave him an antibiotic and a cream.  It cleared up about 80% of it, so he is wondering if he can get another prescription to help clear it the rest of the way.  Pt is requesting a call back with an update.  Pharmacy- [facility] in [place] (same one as before)

## 2023-02-21 NOTE — Telephone Encounter (Signed)
He should continue using the cream -- I do not call in refills of antibiotics-- I sent in more of the cream for him to apply twice a dy

## 2023-02-21 NOTE — Telephone Encounter (Signed)
Patient informed of the message below.

## 2023-03-06 ENCOUNTER — Other Ambulatory Visit: Payer: Self-pay

## 2023-03-06 DIAGNOSIS — E1165 Type 2 diabetes mellitus with hyperglycemia: Secondary | ICD-10-CM

## 2023-03-06 MED ORDER — TIRZEPATIDE 7.5 MG/0.5ML ~~LOC~~ SOAJ: 7.5000 mg | SUBCUTANEOUS | 2 refills | Status: DC

## 2023-03-24 ENCOUNTER — Ambulatory Visit: Payer: Medicare Other

## 2023-03-24 VITALS — Ht 69.0 in | Wt 187.0 lb

## 2023-03-24 DIAGNOSIS — Z0000 Encounter for general adult medical examination without abnormal findings: Secondary | ICD-10-CM | POA: Diagnosis not present

## 2023-03-24 NOTE — Progress Notes (Signed)
Subjective:   Raymond Vasquez is a 72 y.o. male who presents for Medicare Annual/Subsequent preventive examination.  Visit Complete: Virtual I connected with  Raymond Vasquez on 03/24/23 by a audio enabled telemedicine application and verified that I am speaking with the correct person using two identifiers.  Patient Location: Home  Provider Location: Home Office  I discussed the limitations of evaluation and management by telemedicine. The patient expressed understanding and agreed to proceed.  Vital Signs: Because this visit was a virtual/telehealth visit, some criteria may be missing or patient reported. Any vitals not documented were not able to be obtained and vitals that have been documented are patient reported.      Objective:    Today's Vitals   03/24/23 0857  Weight: 187 lb (84.8 kg)  Height: 5\' 9"  (1.753 m)   Body mass index is 27.62 kg/m.     03/24/2023    9:04 AM 03/21/2022    9:00 AM 03/22/2021    8:46 AM 03/19/2021    1:10 PM 03/03/2020    3:32 PM 03/30/2015    2:08 PM  Advanced Directives  Does Patient Have a Medical Advance Directive? No No No No No No  Would patient like information on creating a medical advance directive? No - Patient declined No - Patient declined  No - Patient declined -- No - patient declined information    Current Medications (verified) Outpatient Encounter Medications as of 03/24/2023  Medication Sig   Cyanocobalamin (VITAMIN B 12 PO) Take 1 tablet by mouth daily at 6 (six) AM.   Cyanocobalamin (VITAMIN B-12 PO) Take by mouth daily.   diclofenac (VOLTAREN) 75 MG EC tablet Take 75 mg by mouth daily.   empagliflozin (JARDIANCE) 25 MG TABS tablet Take 1 tablet (25 mg total) by mouth daily.   fluconazole (DIFLUCAN) 150 MG tablet Take 1 tab now.  Repeat dose in 3 days for continued symptoms.   folic acid (FOLVITE) 1 MG tablet Take 1 mg by mouth daily.   glimepiride (AMARYL) 4 MG tablet TAKE 1/2 TABLET BY MOUTH EVERY DAY WITH DINNER    glucose blood (ONETOUCH VERIO) test strip USE TO CHECK BLOOD SUGAR ONCE DAILY ALTERNATING FASTING AND 2 HOURS AFTER A MEAL   glucose blood test strip OneTouch Verio test strips  USE TO CHECK BLOOD SUGAR ONCE DAILY ALTERNATING FASTING AND 2 HOURS AFTER A MEAL   glucose blood test strip USE TO CHECK BLOOD SUGAR ONCE DAILY ALTERNATING FASTING AND 2 HOURS AFTER A MEAL   ketoconazole (NIZORAL) 2 % cream Apply 1 Application topically daily.   metFORMIN (GLUCOPHAGE) 1000 MG tablet Take 1 tablet (1,000 mg total) by mouth in the morning and at bedtime.   Methotrexate Sodium (METHOTREXATE, PF,) 50 MG/2ML injection INJECT 1 ML ONCE A WEEK   nystatin cream (MYCOSTATIN) Apply 1 application topically 2 (two) times daily.   ONETOUCH DELICA LANCETS FINE MISC Use as instructed by provider   rosuvastatin (CRESTOR) 20 MG tablet TAKE 1 TABLET(20 MG) BY MOUTH DAILY   tirzepatide (MOUNJARO) 7.5 MG/0.5ML Pen Inject 7.5 mg into the skin once a week.   No facility-administered encounter medications on file as of 03/24/2023.    Allergies (verified) Patient has no known allergies.   History: Past Medical History:  Diagnosis Date   Allergy    SEASONAL   Angioedema    he is unsure of this or cause   Arthritis    RIGHT shoulder   Balanitis 04/12/2013   Diabetes  mellitus type II, controlled, with no complications (HCC)    on meds   Hemorrhoids    History of laparoscopic adjustable gastric banding, 08/10/2009 07/05/2013   Hyperlipidemia    on meds   Hypertension    Nephrolithiasis    1980s   OSA (obstructive sleep apnea)    PMR (polymyalgia rheumatica) (HCC)    Seizures (HCC)    03/2021   Shingles    2014   Past Surgical History:  Procedure Laterality Date   APPENDECTOMY     COLONOSCOPY  2019   JMP-MAC-2 day mira/sup(good with lavage)-polyps   FINGER SURGERY  06/2012   LAPAROSCOPIC GASTRIC BANDING  08/10/2009   POLYPECTOMY  2019   polyps   ROTATOR CUFF REPAIR Right 06/2019   Dr Thurston Hole    TONSILLECTOMY  1959   Family History  Problem Relation Age of Onset   Heart disease Mother    Diabetes Maternal Grandmother    Colon polyps Neg Hx    Colon cancer Neg Hx    Esophageal cancer Neg Hx    Rectal cancer Neg Hx    Stomach cancer Neg Hx    Crohn's disease Neg Hx    Social History   Socioeconomic History   Marital status: Married    Spouse name: Not on file   Number of children: Not on file   Years of education: Not on file   Highest education level: Not on file  Occupational History   Occupation: retired  Tobacco Use   Smoking status: Some Days    Types: Cigars    Passive exposure: Current (WIFE SMOKES)   Smokeless tobacco: Current    Types: Chew   Tobacco comments:    1 cigar occassionally, not more then 1x per week  Vaping Use   Vaping status: Never Used  Substance and Sexual Activity   Alcohol use: Yes    Comment: rarely   Drug use: No   Sexual activity: Not on file  Other Topics Concern   Not on file  Social History Narrative   Work or School: retired Therapist, 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[place]       Home Situation: lives with wifes      Spiritual Beliefs: none      Lifestyle: no regular exercise, healthy diet      Right handed    Social Determinants of Health   Financial Resource Strain: Low Risk  (03/24/2023)   Overall Financial Resource Strain (CARDIA)    Difficulty of Paying Living Expenses: Not hard at all  Food Insecurity: No Food Insecurity (03/24/2023)   Hunger Vital Sign    Worried About Running Out of Food in the Last Year: Never true    Ran Out of Food in the Last Year: Never true  Transportation Needs: No Transportation Needs (03/24/2023)   PRAPARE - Administrator, Civil Service (Medical): No    Lack of Transportation (Non-Medical): No  Physical Activity: Inactive (03/24/2023)   Exercise Vital Sign    Days of Exercise per Week: 0 days    Minutes of Exercise per Session: 0 min  Stress: No Stress Concern Present (03/24/2023)    [employer] of Occupational Health - Occupational Stress Questionnaire    Feeling of Stress : Not at all  Social Connections: Moderately Isolated (03/24/2023)   Social Connection and Isolation Panel [NHANES]    Frequency of Communication with Friends and Family: More than three times a week    Frequency of Social Gatherings  with Friends and Family: More than three times a week    Attends Religious Services: Never    Database administrator or Organizations: No    Attends Engineer, structural: Never    Marital Status: Married    Tobacco Counseling Ready to quit: No Counseling given: Yes Tobacco comments: 1 cigar occassionally, not more then 1x per week   Clinical Intake:  Pre-visit preparation completed: Yes  Pain : No/denies pain     BMI - recorded: 27.62 Nutritional Status: BMI 25 -29 Overweight Nutritional Risks: None Diabetes: Yes CBG done?: Yes (CBG 92 Per patient) CBG resulted in Enter/ Edit results?: Yes Did pt. bring in CBG monitor from home?: No  How often do you need to have someone help you when you read instructions, pamphlets, or other written materials from your doctor or pharmacy?: 1 - Never  Interpreter Needed?: No  Information entered by :: Theresa Mulligan LPN   Activities of Daily Living    03/24/2023    9:04 AM  In your present state of health, do you have any difficulty performing the following activities:  Hearing? 0  Vision? 0  Difficulty concentrating or making decisions? 0  Walking or climbing stairs? 0  Dressing or bathing? 0  Doing errands, shopping? 0  Preparing Food and eating ? N  Using the Toilet? N  In the past six months, have you accidently leaked urine? N  Do you have problems with loss of bowel control? N  Managing your Medications? N  Managing your Finances? N  Housekeeping or managing your Housekeeping? N    Patient Care Team: Karie Georges, MD as PCP - General (Family Medicine) Reather Littler, MD  (Inactive) as Consulting Physician (Endocrinology) Windy Carina, PA-C (Inactive) (Family Medicine) Van Clines, MD as Consulting Physician (Neurology)  Indicate any recent Medical Services you may have received from other than [facility] providers in the past year (date may be approximate).     Assessment:   This is a routine wellness examination for [place].  Hearing/Vision screen Hearing Screening - Comments:: Denies hearing difficulties   Vision Screening - Comments:: Wears rx glasses - up to date with routine eye exams with  [facility]   Goals Addressed               This Visit's Progress     Increase physical activity (pt-stated)         Depression Screen    03/24/2023    9:02 AM 01/27/2023    1:22 PM 10/19/2022    9:27 AM 04/07/2022   10:30 AM 03/21/2022    8:58 AM 03/19/2021    1:12 PM 03/19/2021    1:08 PM  PHQ 2/9 Scores  PHQ - 2 Score 0 0 0 0 0 0 0  PHQ- 9 Score 0 0  2       Fall Risk    03/24/2023    9:04 AM 01/27/2023    1:21 PM 10/19/2022    9:27 AM 03/21/2022    8:59 AM 03/22/2021    8:46 AM  Fall Risk   Falls in the past year? 0 0 0 0 0  Number falls in past yr: 0 0 0 0 0  Injury with Fall? 0 0 0 0 0  Risk for fall due to : No Fall Risks  Other (Comment) No Fall Risks   Follow up Falls prevention discussed Falls evaluation completed Falls evaluation completed Falls prevention discussed  MEDICARE RISK AT HOME: Medicare Risk at Home Any stairs in or around the home?: Yes If so, are there any without handrails?: No Home free of loose throw rugs in walkways, pet beds, electrical cords, etc?: Yes Adequate lighting in your home to reduce risk of falls?: Yes Life alert?: No Use of a cane, walker or w/c?: No Grab bars in the bathroom?: No Shower chair or bench in shower?: No Elevated toilet seat or a handicapped toilet?: Yes  TIMED UP AND GO:  Was the test performed?  No    Cognitive Function:        03/24/2023    9:05 AM 03/21/2022     9:00 AM 03/03/2020    3:37 PM  6CIT Screen  What Year? 0 points 0 points 0 points  What month? 0 points 0 points 0 points  What time? 0 points 0 points   Count back from 20 0 points 0 points 0 points  Months in reverse 0 points 0 points 0 points  Repeat phrase 0 points 0 points 0 points  Total Score 0 points 0 points     Immunizations Immunization History  Administered Date(s) Administered   Fluad Quad(high Dose 65+) 04/03/2019, 01/31/2020, 03/10/2021, 04/07/2022   Influenza Whole 02/20/2009, 02/21/2011   Influenza, High Dose Seasonal PF 06/05/2018   Influenza,inj,quad, With Preservative 02/20/2021   Influenza-Unspecified 01/22/2016, 02/20/2017   PFIZER(Purple Top)SARS-COV-2 Vaccination 07/30/2019, 08/30/2019   PNEUMOCOCCAL CONJUGATE-20 03/10/2021   Pneumococcal Conjugate-13 08/14/2013, 03/21/2014   Pneumococcal Polysaccharide-23 11/20/2014   Tdap 07/14/2011   Zoster Recombinant(Shingrix) 03/06/2019   Zoster, Live 08/14/2013    TDAP status: Due, Education has been provided regarding the importance of this vaccine. Advised may receive this vaccine at local pharmacy or Health Dept. Aware to provide a copy of the vaccination record if obtained from local pharmacy or Health Dept. Verbalized acceptance and understanding.  Flu Vaccine status: Due, Education has been provided regarding the importance of this vaccine. Advised may receive this vaccine at local pharmacy or Health Dept. Aware to provide a copy of the vaccination record if obtained from local pharmacy or Health Dept. Verbalized acceptance and understanding.  Pneumococcal vaccine status: Up to date  Covid-19 vaccine status: Declined, Education has been provided regarding the importance of this vaccine but patient still declined. Advised may receive this vaccine at local pharmacy or Health Dept.or vaccine clinic. Aware to provide a copy of the vaccination record if obtained from local pharmacy or Health Dept. Verbalized  acceptance and understanding.    Screening Tests Health Maintenance  Topic Date Due   DTaP/Tdap/Td (2 - Td or Tdap) 07/13/2021   INFLUENZA VACCINE  12/22/2022   COVID-19 Vaccine (3 - 2023-24 season) 01/22/2023   Diabetic kidney evaluation - Urine ACR  03/05/2023   HEMOGLOBIN A1C  05/10/2023   OPHTHALMOLOGY EXAM  08/18/2023   Diabetic kidney evaluation - eGFR measurement  10/19/2023   FOOT EXAM  11/10/2023   Medicare Annual Wellness (AWV)  03/23/2024   Colonoscopy  12/07/2026   Pneumonia Vaccine 87+ Years old  Completed   Hepatitis C Screening  Completed   HPV VACCINES  Aged Out   Zoster Vaccines- Shingrix  Discontinued    Health Maintenance  Health Maintenance Due  Topic Date Due   DTaP/Tdap/Td (2 - Td or Tdap) 07/13/2021   INFLUENZA VACCINE  12/22/2022   COVID-19 Vaccine (3 - 2023-24 season) 01/22/2023   Diabetic kidney evaluation - Urine ACR  03/05/2023    Colorectal cancer screening: Type of  screening: Colonoscopy. Completed 12/06/21. Repeat every 5 years     Additional Screening:  Hepatitis C Screening: does qualify; Completed 03/05/15  Vision Screening: Recommended annual ophthalmology exams for early detection of glaucoma and other disorders of the eye. Is the patient up to date with their annual eye exam?  Yes  Who is the provider or what is the name of the office in which the patient attends annual eye exams? [facility] If pt is not established with a provider, would they like to be referred to a provider to establish care? No .   Dental Screening: Recommended annual dental exams for proper oral hygiene  Diabetic Foot Exam: Diabetic Foot Exam: Completed 11/10/22  Community Resource Referral / Chronic Care Management:  CRR required this visit?  No   CCM required this visit?  No     Plan:     I have personally reviewed and noted the following in the patient's chart:   Medical and social history Use of alcohol, tobacco or illicit drugs   Current medications and supplements including opioid prescriptions. Patient is not currently taking opioid prescriptions. Functional ability and status Nutritional status Physical activity Advanced directives List of other physicians Hospitalizations, surgeries, and ER visits in previous 12 months Vitals Screenings to include cognitive, depression, and falls Referrals and appointments  In addition, I have reviewed and discussed with patient certain preventive protocols, quality metrics, and best practice recommendations. A written personalized care plan for preventive services as well as general preventive health recommendations were provided to patient.     Tillie Rung, LPN   16/05/958   After Visit Summary: (MyChart) Due to this being a telephonic visit, the after visit summary with patients personalized plan was offered to patient via MyChart   Nurse Notes: None

## 2023-03-24 NOTE — Patient Instructions (Addendum)
Raymond Vasquez , Thank you for taking time to come for your Medicare Wellness Visit. I appreciate your ongoing commitment to your health goals. Please review the following plan we discussed and let me know if I can assist you in the future.   Referrals/Orders/Follow-Ups/Clinician Recommendations:   This is a list of the screening recommended for you and due dates:  Health Maintenance  Topic Date Due   DTaP/Tdap/Td vaccine (2 - Td or Tdap) 07/13/2021   Flu Shot  12/22/2022   COVID-19 Vaccine (3 - 2023-24 season) 01/22/2023   Yearly kidney health urinalysis for diabetes  03/05/2023   Hemoglobin A1C  05/10/2023   Eye exam for diabetics  08/18/2023   Yearly kidney function blood test for diabetes  10/19/2023   Complete foot exam   11/10/2023   Medicare Annual Wellness Visit  03/23/2024   Colon Cancer Screening  12/07/2026   Pneumonia Vaccine  Completed   Hepatitis C Screening  Completed   HPV Vaccine  Aged Out   Zoster (Shingles) Vaccine  Discontinued    Advanced directives: (Declined) Advance directive discussed with you today. Even though you declined this today, please call our office should you change your mind, and we can give you the proper paperwork for you to fill out.  Next Medicare Annual Wellness Visit scheduled for next year: Yes

## 2023-04-02 ENCOUNTER — Other Ambulatory Visit: Payer: Self-pay | Admitting: Family Medicine

## 2023-05-01 ENCOUNTER — Other Ambulatory Visit: Payer: Self-pay | Admitting: Endocrinology

## 2023-05-01 DIAGNOSIS — E1165 Type 2 diabetes mellitus with hyperglycemia: Secondary | ICD-10-CM

## 2023-05-03 ENCOUNTER — Other Ambulatory Visit: Payer: Medicare Other

## 2023-05-03 ENCOUNTER — Other Ambulatory Visit: Payer: Self-pay

## 2023-05-03 DIAGNOSIS — E1165 Type 2 diabetes mellitus with hyperglycemia: Secondary | ICD-10-CM

## 2023-05-04 LAB — COMPREHENSIVE METABOLIC PANEL
AG Ratio: 2.4 (calc) (ref 1.0–2.5)
ALT: 29 U/L (ref 9–46)
AST: 29 U/L (ref 10–35)
Albumin: 4.7 g/dL (ref 3.6–5.1)
Alkaline phosphatase (APISO): 42 U/L (ref 35–144)
BUN: 15 mg/dL (ref 7–25)
CO2: 31 mmol/L (ref 20–32)
Calcium: 10 mg/dL (ref 8.6–10.3)
Chloride: 104 mmol/L (ref 98–110)
Creat: 0.8 mg/dL (ref 0.70–1.28)
Globulin: 2 g/dL (ref 1.9–3.7)
Glucose, Bld: 83 mg/dL (ref 65–99)
Potassium: 4.6 mmol/L (ref 3.5–5.3)
Sodium: 143 mmol/L (ref 135–146)
Total Bilirubin: 0.7 mg/dL (ref 0.2–1.2)
Total Protein: 6.7 g/dL (ref 6.1–8.1)

## 2023-05-09 ENCOUNTER — Ambulatory Visit: Payer: Medicare Other | Admitting: Endocrinology

## 2023-05-10 ENCOUNTER — Ambulatory Visit: Payer: Medicare Other | Admitting: Endocrinology

## 2023-05-11 ENCOUNTER — Ambulatory Visit: Payer: Medicare Other | Admitting: Family Medicine

## 2023-05-11 ENCOUNTER — Encounter: Payer: Self-pay | Admitting: Family Medicine

## 2023-05-11 VITALS — BP 116/80 | HR 85 | Temp 97.6°F | Ht 69.0 in | Wt 183.7 lb

## 2023-05-11 DIAGNOSIS — L821 Other seborrheic keratosis: Secondary | ICD-10-CM

## 2023-05-11 DIAGNOSIS — E1140 Type 2 diabetes mellitus with diabetic neuropathy, unspecified: Secondary | ICD-10-CM

## 2023-05-11 DIAGNOSIS — Z7984 Long term (current) use of oral hypoglycemic drugs: Secondary | ICD-10-CM | POA: Diagnosis not present

## 2023-05-11 DIAGNOSIS — Z23 Encounter for immunization: Secondary | ICD-10-CM | POA: Diagnosis not present

## 2023-05-11 LAB — HEMOGLOBIN A1C: Hgb A1c MFr Bld: 5.9 % (ref 4.6–6.5)

## 2023-05-11 LAB — MICROALBUMIN / CREATININE URINE RATIO
Creatinine,U: 39.6 mg/dL
Microalb Creat Ratio: 1.9 mg/g (ref 0.0–30.0)
Microalb, Ur: 0.7 mg/dL (ref 0.0–1.9)

## 2023-05-11 NOTE — Progress Notes (Signed)
Established Patient Office Visit  Subjective   Patient ID: Raymond Vasquez, male    DOB: January 11, 1951  Age: 72 y.o. MRN: [id number]  Chief Complaint  Patient presents with   Medical Management of Chronic Issues   Rash    Patient complains of skin lesion noted along the right temple for months, requests referral to dermatologist    Medication Problem    Patient questioned if recurrent balantis could be from Jardiance given by endo    Pt is here for follow up and has a couple of new issues.   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 We discussed the risks/ benefits of staying on the Jardiance and that it might cause his blood sugar to become elevated again if he decides to stop the medication, pt reports that since the medication usually does a good job treating the balanitis then he will stay on the jardiance for now.   DM-- pt states that he is meeting his new endocrinologist soon. He reports his blood sugars at home are always well controlled, his fasting is around 95-110. He is due for A1C and microalbumin testing.  Pt is concerned about a spot on the right temple. He reports that it is raised, brown, sometime itchy, states that he will often accidentally catch it and scratch it with his nail by accident.       Current Outpatient Medications  Medication Instructions   Cyanocobalamin (VITAMIN B 12 PO) 1 tablet, Daily   Cyanocobalamin (VITAMIN B-12 PO) Daily   diclofenac (VOLTAREN) 75 mg, Daily   folic acid (FOLVITE) 1 mg, Daily   glimepiride (AMARYL) 4 MG tablet TAKE 1/2 TABLET BY MOUTH EVERY DAY WITH DINNER   glucose blood (ONETOUCH VERIO) test strip USE TO CHECK BLOOD SUGAR ONCE DAILY ALTERNATING FASTING AND 2 HOURS AFTER A MEAL   glucose blood test strip OneTouch Verio test strips  USE TO CHECK BLOOD SUGAR ONCE DAILY ALTERNATING FASTING AND 2 HOURS AFTER A MEAL   glucose blood test strip  USE TO CHECK BLOOD SUGAR ONCE DAILY ALTERNATING FASTING AND 2 HOURS AFTER A MEAL   JARDIANCE 25 MG TABS tablet TAKE 1 TABLET(25 MG) BY MOUTH DAILY   ketoconazole (NIZORAL) 2 % cream 1 Application, Topical, Daily   metFORMIN (GLUCOPHAGE) 1,000 mg, Oral, 2 times daily   Methotrexate Sodium (METHOTREXATE, PF,) 50 MG/2ML injection INJECT 1 ML ONCE A WEEK   nystatin cream (MYCOSTATIN) 1 application , Topical, 2 times daily   ONETOUCH DELICA LANCETS FINE MISC Use as instructed by provider   rosuvastatin (CRESTOR) 20 MG tablet TAKE 1 TABLET(20 MG) BY MOUTH DAILY   tirzepatide (MOUNJARO) 7.5 mg, Subcutaneous, Weekly    Patient Active Problem List   Diagnosis Date Noted   Type 2 diabetes mellitus with diabetic neuropathy, without long-term current use of insulin (HCC) 05/18/2023   B12 deficiency 09/24/2020   RA (rheumatoid arthritis) (HCC) 07/24/2018   Hyperlipidemia associated with type 2 diabetes mellitus (HCC) 08/10/2017   Anal or rectal pain 08/10/2017   BMI 30.0-30.9,adult 08/04/2016   History of laparoscopic adjustable gastric banding, 08/10/2009 07/05/2013   Hyperlipidemia 11/20/2006   Hypertension associated with diabetes (HCC) 11/20/2006      Review of Systems  All other systems reviewed and are negative.     Objective:     BP 116/80   Pulse 85   Temp 97.6 F (36.4 C) (Oral)  Ht 5\' 9"  (1.753 m)   Wt 183 lb 11.2 oz (83.3 kg)   SpO2 99%   BMI 27.13 kg/m    Physical Exam Vitals reviewed.  Constitutional:      Appearance: Normal appearance. He is well-groomed and normal weight.  Eyes:     Extraocular Movements: Extraocular movements intact.     Conjunctiva/sclera: Conjunctivae normal.  Neck:     Thyroid: No thyromegaly.  Cardiovascular:     Rate and Rhythm: Regular rhythm.     Heart sounds: S1 normal and S2 normal. No murmur heard. Pulmonary:     Effort: Pulmonary effort is normal.     Breath sounds: Normal breath sounds and air entry. No rales.   Musculoskeletal:     Right lower leg: No edema.     Left lower leg: No edema.  Skin:    Findings: Lesion (small 5 mm seborrheic keratosis on the right temple, appears mildly inflamed.) present.  Neurological:     General: No focal deficit present.     Mental Status: He is alert and oriented to person, place, and time.     Gait: Gait is intact.  Psychiatric:        Mood and Affect: Mood and affect normal.    PROCEDURE NOTE: CRYOTHERAPEUTIC SKIN LESION ABLATION   Pre-operative Diagnosis: seborrheic keratosis  Post-operative Diagnosis: same  Locations:  right  temple  Indications:  Therapeutic - to reduce risk of further growth/spread of skin lesions  Anesthesia: None  Procedure Details:  Patient informed of risks (permanent scarring, infection, light or dark discoloration, bleeding, infection, weakness, numbness and recurrence of the lesion) and benefits of the procedure and verbal informed consent obtained.   The area on the right temple was treated with liquid nitrogen therapy, frozen until ice ball extended 1-2 mm beyond lesion, allowed to thaw, and treated again for 3 cycles on the singular lesion. The patient tolerated procedure well.  The patient was instructed on post-op care, warned that there may be blister formation, redness and pain. Recommend OTC analgesia as needed for pain.  Condition: Stable  Complications: None none     The ASCVD Risk score (Arnett DK, et al., 2019) failed to calculate for the following reasons:   The valid total cholesterol range is 130 to 320 mg/dL    Assessment & Plan:  Need for immunization against influenza -     Flu Vaccine Trivalent High Dose (Fluad)  Seborrheic keratosis     -- Pt opted for cryotherapy in office, he tolerated the procedure well. Pt given verbal instructions on the care of the area. Will perform further treatments if needed.   Type 2 diabetes mellitus with diabetic neuropathy, without long-term current use of  insulin (HCC) Assessment & Plan: Needs new testing today, we discussed the balanitis and since he is only having flare ups about 1-2 times per year I recommended that he stay on the SGLT-2 since his diabetes is very well controlled on this medication. Pt voiced understanding.   Orders: -     Microalbumin / creatinine urine ratio -     Hemoglobin A1c     Return in about 1 year (around 05/10/2024) for annual physical exam.    Karie Georges, MD

## 2023-05-18 DIAGNOSIS — E1140 Type 2 diabetes mellitus with diabetic neuropathy, unspecified: Secondary | ICD-10-CM | POA: Insufficient documentation

## 2023-05-18 NOTE — Assessment & Plan Note (Signed)
Needs new testing today, we discussed the balanitis and since he is only having flare ups about 1-2 times per year I recommended that he stay on the SGLT-2 since his diabetes is very well controlled on this medication. Pt voiced understanding.

## 2023-05-25 ENCOUNTER — Ambulatory Visit: Payer: Medicare Other | Admitting: Endocrinology

## 2023-05-25 ENCOUNTER — Encounter: Payer: Self-pay | Admitting: Endocrinology

## 2023-05-25 VITALS — BP 128/80 | HR 61 | Resp 20 | Ht 69.0 in | Wt 183.8 lb

## 2023-05-25 DIAGNOSIS — Z7984 Long term (current) use of oral hypoglycemic drugs: Secondary | ICD-10-CM

## 2023-05-25 DIAGNOSIS — R030 Elevated blood-pressure reading, without diagnosis of hypertension: Secondary | ICD-10-CM

## 2023-05-25 DIAGNOSIS — E782 Mixed hyperlipidemia: Secondary | ICD-10-CM | POA: Diagnosis not present

## 2023-05-25 DIAGNOSIS — E119 Type 2 diabetes mellitus without complications: Secondary | ICD-10-CM | POA: Diagnosis not present

## 2023-05-25 DIAGNOSIS — Z7985 Long-term (current) use of injectable non-insulin antidiabetic drugs: Secondary | ICD-10-CM

## 2023-05-25 MED ORDER — TIRZEPATIDE 10 MG/0.5ML ~~LOC~~ SOAJ: 10.0000 mg | SUBCUTANEOUS | 4 refills | Status: DC

## 2023-05-25 MED ORDER — METFORMIN HCL 1000 MG PO TABS: 1000.0000 mg | ORAL_TABLET | Freq: Two times a day (BID) | ORAL | 3 refills | Status: DC

## 2023-05-25 MED ORDER — GLIMEPIRIDE 4 MG PO TABS: ORAL_TABLET | ORAL | 3 refills | Status: DC

## 2023-05-25 NOTE — Progress Notes (Signed)
 Outpatient Endocrinology Note Star Resler, MD   Patient's Name: Raymond Vasquez    DOB: 07-02-1950    MRN: [id number]                                                    REASON OF VISIT: Follow up for type 2 diabetes mellitus  PCP: Ozell Heron HERO, MD  HISTORY OF PRESENT ILLNESS:   Raymond Vasquez is a 73 y.o. old male with past medical history listed below, is here for follow up for type 2 diabetes mellitus.   Pertinent Diabetes History: Patient was previously seen by Dr. Von and was last time seen in June 2024.  Patient was diagnosed with type 2 diabetes mellitus in 2003.  Patient has controlled type 2 diabetes mellitus.  Chronic Diabetes Complications : Retinopathy: no. Last ophthalmology exam was done on 07/2022, annually, following with ophthalmology regularly.  Nephropathy: no Peripheral neuropathy: no Coronary artery disease: no Stroke: no  Relevant comorbidities and cardiovascular risk factors: Obesity: no Body mass index is 27.14 kg/m.  He had laparoscopic gastric banding surgery in 2011. Hypertension: no  Hyperlipidemia : Yes, on statin   Current / Home Diabetic regimen includes:  Mounjaro  7.5 mg weekly. Metformin  1000 mg 2 times a day. Amaryl  2 mg daily with supper. Jardiance  25 mg daily.  Prior diabetic medications:  Glycemic data:   One Touch Verio glucometer, checking once a day in the morning fasting.  Average blood sugar in last 2 weeks December 19 to May 25, 2023 is 102.  Lowest blood sugar 85, highest blood sugar 140.  Acceptable fasting blood sugar.  No hypoglycemia.  Hypoglycemia: Patient has no hypoglycemic episodes. Patient has hypoglycemia awareness.  Factors modifying glucose control: 1.  Diabetic diet assessment: 3 meals a day.  2.  Staying active or exercising:   3.  Medication compliance: compliant all of the time.  Interval history  Glucometer data as reviewed above.  Recent lab results with hemoglobin A1c 5.9%.  Recent lab results  reviewed.  He has recurrent balanitis, most recent was in September 2024.  He reports he gets balanitis about 2 times a year.  He has been taking Jardiance .  He has been taking Mounjaro  without any GI issues.  Diabetes regimen as noted above.  He has no other complaints today.  REVIEW OF SYSTEMS As per history of present illness.   PAST MEDICAL HISTORY: Past Medical History:  Diagnosis Date   Allergy    SEASONAL   Angioedema    he is unsure of this or cause   Arthritis    RIGHT shoulder   Balanitis 04/12/2013   Diabetes mellitus type II, controlled, with no complications (HCC)    on meds   Hemorrhoids    History of laparoscopic adjustable gastric banding, 08/10/2009 07/05/2013   Hyperlipidemia    on meds   Hypertension    Nephrolithiasis    1980s   OSA (obstructive sleep apnea)    PMR (polymyalgia rheumatica) (HCC)    Seizures (HCC)    03/2021   Shingles    2014    PAST SURGICAL HISTORY: Past Surgical History:  Procedure Laterality Date   APPENDECTOMY     COLONOSCOPY  2019   JMP-MAC-2 day mira/sup(good with lavage)-polyps   FINGER SURGERY  06/2012   LAPAROSCOPIC GASTRIC BANDING  08/10/2009  POLYPECTOMY  2019   polyps   ROTATOR CUFF REPAIR Right 06/2019   Dr Jane   TONSILLECTOMY  1959    ALLERGIES: No Known Allergies  FAMILY HISTORY:  Family History  Problem Relation Age of Onset   Heart disease Mother    Diabetes Maternal Grandmother    Colon polyps Neg Hx    Colon cancer Neg Hx    Esophageal cancer Neg Hx    Rectal cancer Neg Hx    Stomach cancer Neg Hx    Crohn's disease Neg Hx     SOCIAL HISTORY: Social History   Socioeconomic History   Marital status: Married    Spouse name: Not on file   Number of children: Not on file   Years of education: Not on file   Highest education level: Not on file  Occupational History   Occupation: retired  Tobacco Use   Smoking status: Some Days    Types: Cigars    Passive exposure: Current (WIFE SMOKES)    Smokeless tobacco: Current    Types: Chew   Tobacco comments:    1 cigar occassionally, not more then 1x per week  Vaping Use   Vaping status: Never Used  Substance and Sexual Activity   Alcohol use: Yes    Comment: rarely   Drug use: No   Sexual activity: Not on file  Other Topics Concern   Not on file  Social History Narrative   Work or School: retired therapist, 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[place]       Home Situation: lives with wifes      Spiritual Beliefs: none      Lifestyle: no regular exercise, healthy diet      Right handed    Social Drivers of Health   Financial Resource Strain: Low Risk  (03/24/2023)   Overall Financial Resource Strain (CARDIA)    Difficulty of Paying Living Expenses: Not hard at all  Food Insecurity: No Food Insecurity (03/24/2023)   Hunger Vital Sign    Worried About Running Out of Food in the Last Year: Never true    Ran Out of Food in the Last Year: Never true  Transportation Needs: No Transportation Needs (03/24/2023)   PRAPARE - Administrator, Civil Service (Medical): No    Lack of Transportation (Non-Medical): No  Physical Activity: Inactive (03/24/2023)   Exercise Vital Sign    Days of Exercise per Week: 0 days    Minutes of Exercise per Session: 0 min  Stress: No Stress Concern Present (03/24/2023)   [employer] of Occupational Health - Occupational Stress Questionnaire    Feeling of Stress : Not at all  Social Connections: Moderately Isolated (03/24/2023)   Social Connection and Isolation Panel [NHANES]    Frequency of Communication with Friends and Family: More than three times a week    Frequency of Social Gatherings with Friends and Family: More than three times a week    Attends Religious Services: Never    Database Administrator or Organizations: No    Attends Engineer, Structural: Never    Marital Status: Married    MEDICATIONS:  Current Outpatient Medications  Medication Sig Dispense Refill    Cyanocobalamin  (VITAMIN B 12 PO) Take 1 tablet by mouth daily at 6 (six) AM.     Cyanocobalamin  (VITAMIN B-12 PO) Take by mouth daily.     diclofenac (VOLTAREN) 75 MG EC tablet Take 75 mg by mouth daily.  folic acid  (FOLVITE ) 1 MG tablet Take 1 mg by mouth daily.     glucose blood (ONETOUCH VERIO) test strip USE TO CHECK BLOOD SUGAR ONCE DAILY ALTERNATING FASTING AND 2 HOURS AFTER A MEAL 50 strip 2   glucose blood test strip OneTouch Verio test strips  USE TO CHECK BLOOD SUGAR ONCE DAILY ALTERNATING FASTING AND 2 HOURS AFTER A MEAL     glucose blood test strip USE TO CHECK BLOOD SUGAR ONCE DAILY ALTERNATING FASTING AND 2 HOURS AFTER A MEAL     ketoconazole  (NIZORAL ) 2 % cream Apply 1 Application topically daily. 30 g 2   Methotrexate Sodium (METHOTREXATE, PF,) 50 MG/2ML injection INJECT 1 ML ONCE A WEEK     nystatin  cream (MYCOSTATIN ) Apply 1 application topically 2 (two) times daily. 30 g 0   ONETOUCH DELICA LANCETS FINE MISC Use as instructed by provider 200 each 3   rosuvastatin  (CRESTOR ) 20 MG tablet TAKE 1 TABLET(20 MG) BY MOUTH DAILY 90 tablet 0   tirzepatide  (MOUNJARO ) 10 MG/0.5ML Pen Inject 10 mg into the skin once a week. 6 mL 4   glimepiride  (AMARYL ) 4 MG tablet TAKE 1/2 TABLET BY MOUTH EVERY DAY WITH DINNER 45 tablet 3   metFORMIN  (GLUCOPHAGE ) 1000 MG tablet Take 1 tablet (1,000 mg total) by mouth in the morning and at bedtime. 180 tablet 3   No current facility-administered medications for this visit.    PHYSICAL EXAM: Vitals:   05/25/23 0915  BP: 128/80  Pulse: 61  Resp: 20  SpO2: 96%  Weight: 183 lb 12.8 oz (83.4 kg)  Height: 5' 9 (1.753 m)   Body mass index is 27.14 kg/m.  Wt Readings from Last 3 Encounters:  05/25/23 183 lb 12.8 oz (83.4 kg)  05/11/23 183 lb 11.2 oz (83.3 kg)  03/24/23 187 lb (84.8 kg)    General: Well developed, well nourished male in no apparent distress.  HEENT: AT/[place], no external lesions.  Eyes: Conjunctiva clear and no icterus. Neck:  Neck supple  Lungs: Respirations not labored Neurologic: Alert, oriented, normal speech Extremities / Skin: Dry. No sores or rashes noted.  Psychiatric: Does not appear depressed or anxious  Diabetic Foot Exam - Simple   No data filed     LABS Reviewed Lab Results  Component Value Date   HGBA1C 5.9 05/11/2023   HGBA1C 6.7 (H) 11/08/2022   HGBA1C 6.1 (A) 07/12/2022   Lab Results  Component Value Date   FRUCTOSAMINE 263 03/07/2019   FRUCTOSAMINE 281 03/22/2018   Lab Results  Component Value Date   CHOL 106 07/12/2022   HDL 34.50 (L) 07/12/2022   LDLCALC 39 07/12/2022   LDLDIRECT 154.1 02/16/2010   TRIG 165.0 (H) 07/12/2022   CHOLHDL 3 07/12/2022   Lab Results  Component Value Date   MICRALBCREAT 1.9 05/11/2023   MICRALBCREAT 2.1 03/04/2022   Lab Results  Component Value Date   CREATININE 0.80 05/03/2023   Lab Results  Component Value Date   GFR 89.58 10/19/2022    ASSESSMENT / PLAN  1. Type 2 diabetes mellitus without complication, without long-term current use of insulin (HCC)   2. White coat syndrome with high blood pressure without hypertension   3. Mixed hyperlipidemia     Diabetes Mellitus type 2, complicated by no known complications. - Diabetic status / severity: Controlled.  Lab Results  Component Value Date   HGBA1C 5.9 05/11/2023    - Hemoglobin A1c goal : <7%  - Medications: See below.  Stop Jardiance ,  due to recurrent yeast infection. Increase Mounjaro  from 7.5 to 10 mg weekly. Continue metformin  1000 mg 2 times a day. Continue Amaryl /glimepiride  2 mg daily.  - Home glucose testing: In the morning fasting and at bedtime. - Discussed/ Gave Hypoglycemia treatment plan.  # Consult : not required at this time.   # Annual urine for microalbuminuria/ creatinine ratio, no microalbuminuria currently. Last  Lab Results  Component Value Date   MICRALBCREAT 1.9 05/11/2023    # Foot check nightly.  # Annual dilated diabetic eye exams.    - Diet: Make healthy diabetic food choices - Life style / activity / exercise: Discussed.  2. Blood pressure  -  BP Readings from Last 1 Encounters:  05/25/23 128/80    - Control is in target.  - No change in current plans.  3. Lipid status / Hyperlipidemia - Last  Lab Results  Component Value Date   LDLCALC 39 07/12/2022   - Continue rosuvastatin  20 mg daily.  Managed by primary care provider.  Diagnoses and all orders for this visit:  Type 2 diabetes mellitus without complication, without long-term current use of insulin (HCC)  White coat syndrome with high blood pressure without hypertension -     metFORMIN  (GLUCOPHAGE ) 1000 MG tablet; Take 1 tablet (1,000 mg total) by mouth in the morning and at bedtime.  Mixed hyperlipidemia  Other orders -     tirzepatide  (MOUNJARO ) 10 MG/0.5ML Pen; Inject 10 mg into the skin once a week. -     glimepiride  (AMARYL ) 4 MG tablet; TAKE 1/2 TABLET BY MOUTH EVERY DAY WITH DINNER    DISPOSITION Follow up in clinic in 3  months suggested.  Labs on the same day of the follow-up visit.   All questions answered and patient verbalized understanding of the plan.  Ranson Belluomini, MD [facility] Endocrinology [facility] [address] [place], [address] [place], [place] [address] Phone # [phone]  At least part of this note was generated using voice recognition software. Inadvertent word errors may have occurred, which were not recognized during the proofreading process.

## 2023-05-25 NOTE — Patient Instructions (Signed)
 Diabetes regimen  Stop Jardiance, due to recurrent yeast infection. Increase Mounjaro from 7.5 to 10 mg weekly. Continue metformin 1000 mg 2 times a day. Continue Amaryl/glimepiride 2 mg daily.

## 2023-06-19 ENCOUNTER — Other Ambulatory Visit: Payer: Self-pay | Admitting: Endocrinology

## 2023-06-19 DIAGNOSIS — E119 Type 2 diabetes mellitus without complications: Secondary | ICD-10-CM

## 2023-06-19 NOTE — Telephone Encounter (Signed)
Refill request complete

## 2023-06-29 ENCOUNTER — Other Ambulatory Visit: Payer: Self-pay | Admitting: Family Medicine

## 2023-09-22 ENCOUNTER — Telehealth: Payer: Self-pay

## 2023-09-22 ENCOUNTER — Ambulatory Visit (INDEPENDENT_AMBULATORY_CARE_PROVIDER_SITE_OTHER): Payer: Medicare Other | Admitting: Endocrinology

## 2023-09-22 ENCOUNTER — Encounter: Payer: Self-pay | Admitting: Endocrinology

## 2023-09-22 VITALS — BP 120/80 | HR 64 | Resp 20 | Ht 69.0 in | Wt 184.6 lb

## 2023-09-22 DIAGNOSIS — Z7985 Long-term (current) use of injectable non-insulin antidiabetic drugs: Secondary | ICD-10-CM

## 2023-09-22 DIAGNOSIS — Z7984 Long term (current) use of oral hypoglycemic drugs: Secondary | ICD-10-CM | POA: Diagnosis not present

## 2023-09-22 DIAGNOSIS — E119 Type 2 diabetes mellitus without complications: Secondary | ICD-10-CM | POA: Diagnosis not present

## 2023-09-22 DIAGNOSIS — R030 Elevated blood-pressure reading, without diagnosis of hypertension: Secondary | ICD-10-CM | POA: Diagnosis not present

## 2023-09-22 LAB — POCT GLYCOSYLATED HEMOGLOBIN (HGB A1C): Hemoglobin A1C: 5.5 % (ref 4.0–5.6)

## 2023-09-22 MED ORDER — TIRZEPATIDE 10 MG/0.5ML ~~LOC~~ SOAJ: 10.0000 mg | SUBCUTANEOUS | 4 refills | Status: DC

## 2023-09-22 MED ORDER — METFORMIN HCL 1000 MG PO TABS: 1000.0000 mg | ORAL_TABLET | Freq: Two times a day (BID) | ORAL | 3 refills | Status: AC

## 2023-09-22 MED ORDER — GLIMEPIRIDE 4 MG PO TABS
ORAL_TABLET | ORAL | 3 refills | Status: AC
Start: 2023-09-22 — End: ?

## 2023-09-22 NOTE — Telephone Encounter (Signed)
 Pharmacy Patient Advocate Encounter   Received notification from CoverMyMeds that prior authorization for Mounjaro  is required/requested.   Insurance verification completed.   The patient is insured through [facility] ADVANTAGE/RX ADVANCE .   Per test claim: PA required and submitted KEY/EOC/Request #: [id number] APPROVED from 09/22/23 to 09/21/24

## 2023-09-22 NOTE — Progress Notes (Signed)
 Outpatient Endocrinology Note Raymond Valen Mascaro, MD   Patient's Name: Raymond Vasquez    DOB: May 07, 1951    MRN: [id number]                                                    REASON OF VISIT: Follow up for type 2 diabetes mellitus  PCP: Raymond House, MD  HISTORY OF PRESENT ILLNESS:   Raymond Vasquez is a 73 y.o. old male with past medical history listed below, is here for follow up for type 2 diabetes mellitus.   Pertinent Diabetes History: Patient was previously seen by Dr. Hubert Vasquez and was last time seen in June 2024.  Patient was diagnosed with type 2 diabetes mellitus in 2003.  Patient has controlled type 2 diabetes mellitus.  Chronic Diabetes Complications : Retinopathy: no. Last ophthalmology exam was done on 07/2022, annually, following with ophthalmology regularly.  Nephropathy: no Peripheral neuropathy: no Coronary artery disease: no Stroke: no  Relevant comorbidities and cardiovascular risk factors: Obesity: no Body mass index is 27.26 kg/m.  He had laparoscopic gastric banding surgery in 2011. Hypertension: no  Hyperlipidemia : Yes, on statin   Current / Home Diabetic regimen includes:  Mounjaro  10 mg weekly. Metformin  1000 mg 2 times a day. Amaryl  2 mg daily with supper.  Prior diabetic medications: Jardiance  was stopped in January 2025 due to recurrent yeast infection.  Glycemic data:   One Touch Verio glucometer, checking once a day in the morning fasting.  Average blood sugar in last 2 weeks April 18 - May 2 , 2025 is 104.  Lowest blood sugar 84, highest blood sugar 140.  Acceptable fasting blood sugar.  No hypoglycemia.  No hypoglycemia.  Hypoglycemia: Patient has no hypoglycemic episodes. Patient has hypoglycemia awareness.  Factors modifying glucose control: 1.  Diabetic diet assessment: 3 meals a day.  2.  Staying active or exercising:   3.  Medication compliance: compliant all of the time.  Interval history  He is not having yeast infection after  stopping Jardiance .  Hemoglobin A1c 5.5%.  Denies any GI issues on taking Mounjaro .  Diabetes has been as reviewed and noted above.  Glucometer data as reviewed and noted above.  No other complaints today.  REVIEW OF SYSTEMS As per history of present illness.   PAST MEDICAL HISTORY: Past Medical History:  Diagnosis Date   Allergy    SEASONAL   Angioedema    he is unsure of this or cause   Arthritis    RIGHT shoulder   Balanitis 04/12/2013   Diabetes mellitus type II, controlled, with no complications (HCC)    on meds   Hemorrhoids    History of laparoscopic adjustable gastric banding, 08/10/2009 07/05/2013   Hyperlipidemia    on meds   Hypertension    Nephrolithiasis    1980s   OSA (obstructive sleep apnea)    PMR (polymyalgia rheumatica) (HCC)    Seizures (HCC)    03/2021   Shingles    2014    PAST SURGICAL HISTORY: Past Surgical History:  Procedure Laterality Date   APPENDECTOMY     COLONOSCOPY  2019   JMP-MAC-2 day mira/sup(good with lavage)-polyps   FINGER SURGERY  06/2012   LAPAROSCOPIC GASTRIC BANDING  08/10/2009   POLYPECTOMY  2019   polyps   ROTATOR CUFF REPAIR Right 06/2019  Dr Raymond Vasquez   TONSILLECTOMY  [phone]    ALLERGIES: No Known Allergies  FAMILY HISTORY:  Family History  Problem Relation Age of Onset   Heart disease Mother    Diabetes Maternal Grandmother    Colon polyps Neg Hx    Colon cancer Neg Hx    Esophageal cancer Neg Hx    Rectal cancer Neg Hx    Stomach cancer Neg Hx    Crohn's disease Neg Hx     SOCIAL HISTORY: Social History   Socioeconomic History   Marital status: Married    Spouse name: Not on file   Number of children: Not on file   Years of education: Not on file   Highest education level: Not on file  Occupational History   Occupation: retired  Tobacco Use   Smoking status: Some Days    Types: Cigars    Passive exposure: Current (WIFE SMOKES)   Smokeless tobacco: Current    Types: Chew   Tobacco comments:    1  cigar occassionally, not more then 1x per week  Vaping Use   Vaping status: Never Used  Substance and Sexual Activity   Alcohol use: Yes    Comment: rarely   Drug use: No   Sexual activity: Not on file  Other Topics Concern   Not on file  Social History Narrative   Work or School: retired Therapist, 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[place]       Home Situation: lives with wifes      Spiritual Beliefs: none      Lifestyle: no regular exercise, healthy diet      Right handed    Social Drivers of Health   Financial Resource Strain: Low Risk  (03/24/2023)   Overall Financial Resource Strain (CARDIA)    Difficulty of Paying Living Expenses: Not hard at all  Food Insecurity: No Food Insecurity (03/24/2023)   Hunger Vital Sign    Worried About Running Out of Food in the Last Year: Never true    Ran Out of Food in the Last Year: Never true  Transportation Needs: No Transportation Needs (03/24/2023)   PRAPARE - Administrator, Civil Service (Medical): No    Lack of Transportation (Non-Medical): No  Physical Activity: Inactive (03/24/2023)   Exercise Vital Sign    Days of Exercise per Week: 0 days    Minutes of Exercise per Session: 0 min  Stress: No Stress Concern Present (03/24/2023)   Raymond Vasquez of Occupational Health - Occupational Stress Questionnaire    Feeling of Stress : Not at all  Social Connections: Moderately Isolated (03/24/2023)   Social Connection and Isolation Panel [NHANES]    Frequency of Communication with Friends and Family: More than three times a week    Frequency of Social Gatherings with Friends and Family: More than three times a week    Attends Religious Services: Never    Database administrator or Organizations: No    Attends Engineer, structural: Never    Marital Status: Married    MEDICATIONS:  Current Outpatient Medications  Medication Sig Dispense Refill   Cyanocobalamin  (VITAMIN B 12 PO) Take 1 tablet by mouth daily at 6 (six) AM.      Cyanocobalamin  (VITAMIN B-12 PO) Take by mouth daily.     diclofenac (VOLTAREN) 75 MG EC tablet Take 75 mg by mouth daily.     folic acid  (FOLVITE ) 1 MG tablet Take 1 mg by mouth daily.  glucose blood (ONETOUCH VERIO) test strip USE TO TEST BLOOD SUGAR DAILY, ALTERNATING FASTING AND 2 HOURS AFTER A MEAL. 50 strip 2   glucose blood test strip OneTouch Verio test strips  USE TO CHECK BLOOD SUGAR ONCE DAILY ALTERNATING FASTING AND 2 HOURS AFTER A MEAL     glucose blood test strip USE TO CHECK BLOOD SUGAR ONCE DAILY ALTERNATING FASTING AND 2 HOURS AFTER A MEAL     ketoconazole  (NIZORAL ) 2 % cream Apply 1 Application topically daily. 30 g 2   Methotrexate Sodium (METHOTREXATE, PF,) 50 MG/2ML injection INJECT 1 ML ONCE A WEEK     nystatin  cream (MYCOSTATIN ) Apply 1 application topically 2 (two) times daily. 30 g 0   ONETOUCH DELICA LANCETS FINE MISC Use as instructed by provider 200 each 3   rosuvastatin  (CRESTOR ) 20 MG tablet TAKE 1 TABLET(20 MG) BY MOUTH DAILY 90 tablet 1   glimepiride  (AMARYL ) 4 MG tablet TAKE 1/2 TABLET BY MOUTH EVERY DAY WITH DINNER 45 tablet 3   metFORMIN  (GLUCOPHAGE ) 1000 MG tablet Take 1 tablet (1,000 mg total) by mouth in the morning and at bedtime. 180 tablet 3   tirzepatide  (MOUNJARO ) 10 MG/0.5ML Pen Inject 10 mg into the skin once a week. 6 mL 4   No current facility-administered medications for this visit.    PHYSICAL EXAM: Vitals:   09/22/23 0928  BP: 120/80  Pulse: 64  Resp: 20  SpO2: 99%  Weight: 184 lb 9.6 oz (83.7 kg)  Height: 5\' 9"  (1.753 m)   Body mass index is 27.26 kg/m.  Wt Readings from Last 3 Encounters:  09/22/23 184 lb 9.6 oz (83.7 kg)  05/25/23 183 lb 12.8 oz (83.4 kg)  05/11/23 183 lb 11.2 oz (83.3 kg)    General: Well developed, well nourished male in no apparent distress.  HEENT: AT/[place], no external lesions.  Eyes: Conjunctiva clear and no icterus. Neck: Neck supple  Lungs: Respirations not labored Neurologic: Alert, oriented,  normal speech Extremities / Skin: Dry.  Psychiatric: Does not appear depressed or anxious  Diabetic Foot Exam - Simple   No data filed     LABS Reviewed Lab Results  Component Value Date   HGBA1C 5.5 09/22/2023   HGBA1C 5.9 05/11/2023   HGBA1C 6.7 (H) 11/08/2022   Lab Results  Component Value Date   FRUCTOSAMINE 263 03/07/2019   FRUCTOSAMINE 281 03/22/2018   Lab Results  Component Value Date   CHOL 106 07/12/2022   HDL 34.50 (L) 07/12/2022   LDLCALC 39 07/12/2022   LDLDIRECT 154.1 02/16/2010   TRIG 165.0 (H) 07/12/2022   CHOLHDL 3 07/12/2022   Lab Results  Component Value Date   MICRALBCREAT 1.9 05/11/2023   MICRALBCREAT 2.1 03/04/2022   Lab Results  Component Value Date   CREATININE 0.80 05/03/2023   Lab Results  Component Value Date   GFR 89.58 10/19/2022    ASSESSMENT / PLAN  1. Type 2 diabetes mellitus without complication, without long-term current use of insulin (HCC)   2. White coat syndrome with high blood pressure without hypertension     Diabetes Mellitus type 2, complicated by no known complications. - Diabetic status / severity: Controlled.  Lab Results  Component Value Date   HGBA1C 5.5 09/22/2023    - Hemoglobin A1c goal : < 6.5%  - Medications: See below.  No Change.  Continue Mounjaro  10 mg weekly. Continue metformin  1000 mg 2 times a day. Continue Amaryl /glimepiride  2 mg daily.  - Home glucose testing: In  the morning fasting and at bedtime occasionally. - Discussed/ Gave Hypoglycemia treatment plan.  # Consult : not required at this time.   # Annual urine for microalbuminuria/ creatinine ratio, no microalbuminuria currently. Last  Lab Results  Component Value Date   MICRALBCREAT 1.9 05/11/2023    # Foot check nightly.  # Annual dilated diabetic eye exams.   - Diet: Make healthy diabetic food choices - Life style / activity / exercise: Discussed.  2. Blood pressure  -  BP Readings from Last 1 Encounters:  09/22/23  120/80    - Control is in target.  - No change in current plans.  3. Lipid status / Hyperlipidemia - Last  Lab Results  Component Value Date   LDLCALC 39 07/12/2022   - Continue rosuvastatin  20 mg daily.  Managed by primary care provider.  Diagnoses and all orders for this visit:  Type 2 diabetes mellitus without complication, without long-term current use of insulin (HCC) -     POCT glycosylated hemoglobin (Hb A1C) -     glimepiride  (AMARYL ) 4 MG tablet; TAKE 1/2 TABLET BY MOUTH EVERY DAY WITH DINNER -     tirzepatide  (MOUNJARO ) 10 MG/0.5ML Pen; Inject 10 mg into the skin once a week.  White coat syndrome with high blood pressure without hypertension -     metFORMIN  (GLUCOPHAGE ) 1000 MG tablet; Take 1 tablet (1,000 mg total) by mouth in the morning and at bedtime.     DISPOSITION Follow up in clinic in 4  months suggested.    All questions answered and patient verbalized understanding of the plan.  Raymond Roshunda Keir, MD [facility] [address] [place], [address] [place], [place] [address] Phone # [phone]  At least part of this note was generated using voice recognition software. Inadvertent word errors may have occurred, which were not recognized during the proofreading process.

## 2023-09-29 ENCOUNTER — Other Ambulatory Visit: Payer: Self-pay

## 2023-09-29 DIAGNOSIS — E119 Type 2 diabetes mellitus without complications: Secondary | ICD-10-CM

## 2023-09-29 MED ORDER — ONETOUCH VERIO VI STRP
ORAL_STRIP | 2 refills | Status: DC
Start: 2023-09-29 — End: 2024-04-15

## 2023-10-01 ENCOUNTER — Other Ambulatory Visit: Payer: Self-pay | Admitting: Family Medicine

## 2023-10-17 DIAGNOSIS — Z79899 Other long term (current) drug therapy: Secondary | ICD-10-CM | POA: Diagnosis not present

## 2023-10-17 DIAGNOSIS — Z6827 Body mass index (BMI) 27.0-27.9, adult: Secondary | ICD-10-CM | POA: Diagnosis not present

## 2023-10-17 DIAGNOSIS — G5792 Unspecified mononeuropathy of left lower limb: Secondary | ICD-10-CM | POA: Diagnosis not present

## 2023-10-17 DIAGNOSIS — E663 Overweight: Secondary | ICD-10-CM | POA: Diagnosis not present

## 2023-10-17 DIAGNOSIS — M1991 Primary osteoarthritis, unspecified site: Secondary | ICD-10-CM | POA: Diagnosis not present

## 2023-10-17 DIAGNOSIS — M0600 Rheumatoid arthritis without rheumatoid factor, unspecified site: Secondary | ICD-10-CM | POA: Diagnosis not present

## 2023-10-17 DIAGNOSIS — I7300 Raynaud's syndrome without gangrene: Secondary | ICD-10-CM | POA: Diagnosis not present

## 2023-11-06 ENCOUNTER — Ambulatory Visit: Admitting: Family Medicine

## 2023-11-13 ENCOUNTER — Encounter: Payer: Self-pay | Admitting: Family Medicine

## 2023-11-13 ENCOUNTER — Ambulatory Visit (INDEPENDENT_AMBULATORY_CARE_PROVIDER_SITE_OTHER): Admitting: Family Medicine

## 2023-11-13 VITALS — BP 102/68 | HR 66 | Temp 97.9°F | Ht 69.0 in | Wt 181.7 lb

## 2023-11-13 DIAGNOSIS — G4709 Other insomnia: Secondary | ICD-10-CM | POA: Diagnosis not present

## 2023-11-13 MED ORDER — HYDROXYZINE HCL 25 MG PO TABS: 25.0000 mg | ORAL_TABLET | Freq: Every evening | ORAL | 2 refills | Status: DC | PRN

## 2023-11-13 NOTE — Progress Notes (Signed)
 Established Patient Office Visit  Subjective   Patient ID: Raymond Vasquez, male    DOB: Jul 18, 1950  Age: 73 y.o. MRN: [id number]  Chief Complaint  Patient presents with   Insomnia    Patient complains of difficulty sleeping for the past few months    Pt reports for the past several months he has been having trouble sleeping. States that he is sleeping on the couch now, states that he is uncomfortable at night, sometimes with neck or back pain. States that often he is staying up late into the night either watching TV and playing video games. States that he can't shut his brain off to sleep. States that he is not napping during the day. States that he really doesn't need to wake up to urinate. No other sleep disturbance. When he is able to fall asleep he is able to stay asleep.   Pt is also reporting some memory issues as well. States that this has been going on a lot longer than the sleep issue.   Insomnia Primary symptoms: difficulty falling asleep, no napping.   The current episode started more than one month. The problem occurs nightly. The problem is unchanged. The symptoms are aggravated by bed partner. How many beverages per day that contain caffeine: 2-3.  Types of beverages you drink: coffee. Past treatments include nothing. Typical bedtime:  11-12 P.M..  How long after going to bed to you fall asleep: over an hour.   Prior diagnostic workup includes:  No prior workup.    Current Outpatient Medications  Medication Instructions   Cyanocobalamin  (VITAMIN B 12 PO) 1 tablet, Daily   Cyanocobalamin  (VITAMIN B-12 PO) Daily   diclofenac (VOLTAREN) 75 mg, Daily   folic acid  (FOLVITE ) 1 mg, Daily   glimepiride  (AMARYL ) 4 MG tablet TAKE 1/2 TABLET BY MOUTH EVERY DAY WITH DINNER   glucose blood (ONETOUCH VERIO) test strip USE TO TEST BLOOD SUGAR DAILY, ALTERNATING FASTING AND 2 HOURS AFTER A MEAL.   hydrOXYzine (ATARAX) 25-50 mg, Oral, At bedtime PRN   ketoconazole  (NIZORAL ) 2 % cream 1  Application, Topical, Daily   metFORMIN  (GLUCOPHAGE ) 1,000 mg, Oral, 2 times daily   Methotrexate Sodium (METHOTREXATE, PF,) 50 MG/2ML injection INJECT 1 ML ONCE A WEEK   nystatin  cream (MYCOSTATIN ) 1 application , Topical, 2 times daily   ONETOUCH DELICA LANCETS FINE MISC Use as instructed by provider   rosuvastatin  (CRESTOR ) 20 MG tablet TAKE 1 TABLET(20 MG) BY MOUTH DAILY   tirzepatide  (MOUNJARO ) 10 mg, Subcutaneous, Weekly    Patient Active Problem List   Diagnosis Date Noted   Type 2 diabetes mellitus with diabetic neuropathy, without long-term current use of insulin (HCC) 05/18/2023   B12 deficiency 09/24/2020   RA (rheumatoid arthritis) (HCC) 07/24/2018   Hyperlipidemia associated with type 2 diabetes mellitus (HCC) 08/10/2017   Anal or rectal pain 08/10/2017   BMI 30.0-30.9,adult 08/04/2016   History of laparoscopic adjustable gastric banding, 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   All other systems reviewed and are negative.     Objective:     BP 102/68   Pulse 66   Temp 97.9 F (36.6 C) (Oral)   Ht 5' 9 (1.753 m)   Wt 181 lb 11.2 oz (82.4 kg)   SpO2 99%   BMI 26.83 kg/m    Physical Exam Vitals reviewed.  Constitutional:  Appearance: Normal appearance. He is well-groomed and normal weight.   Cardiovascular:     Rate and Rhythm: Normal rate and regular rhythm.     Heart sounds: S1 normal and S2 normal. No murmur heard. Pulmonary:     Effort: Pulmonary effort is normal.     Breath sounds: Normal breath sounds and air entry. No rales.  Abdominal:     General: Bowel sounds are normal.   Musculoskeletal:     Right lower leg: No edema.     Left lower leg: No edema.   Neurological:     General: No focal deficit present.     Mental Status: He is alert and oriented to person, place, and time.     Gait: Gait  is intact.   Psychiatric:        Mood and Affect: Mood and affect normal.      No results found for any visits on 11/13/23.    The ASCVD Risk score (Arnett DK, et al., 2019) failed to calculate for the following reasons:   The valid total cholesterol range is 130 to 320 mg/dL    Assessment & Plan:  Other insomnia -     hydrOXYzine HCl; Take 1-2 tablets (25-50 mg total) by mouth at bedtime as needed (insomnia).  Dispense: 60 tablet; Refill: 2   I think this is a mixed picture of inadequate accommodations at night vs difficulty initiating sleep. I recommended several behavioral changes (handouts given) and will start PRN hydroxyzine at night as needed. RTC in 6 months for his annual exam  Return in about 6 months (around 05/14/2024) for annual physical exam.    Heron CHRISTELLA Sharper, MD

## 2023-11-13 NOTE — Patient Instructions (Signed)
 Take Hydroxyzine 1-2 capsules at least 30 minutes before bedtime.

## 2023-12-05 ENCOUNTER — Other Ambulatory Visit: Payer: Self-pay | Admitting: Family Medicine

## 2023-12-05 DIAGNOSIS — G4709 Other insomnia: Secondary | ICD-10-CM

## 2023-12-25 ENCOUNTER — Other Ambulatory Visit: Payer: Self-pay | Admitting: Family Medicine

## 2024-01-19 DIAGNOSIS — M0600 Rheumatoid arthritis without rheumatoid factor, unspecified site: Secondary | ICD-10-CM | POA: Diagnosis not present

## 2024-01-23 ENCOUNTER — Encounter: Payer: Self-pay | Admitting: Endocrinology

## 2024-01-23 ENCOUNTER — Ambulatory Visit (INDEPENDENT_AMBULATORY_CARE_PROVIDER_SITE_OTHER): Admitting: Endocrinology

## 2024-01-23 ENCOUNTER — Ambulatory Visit: Payer: Self-pay | Admitting: Endocrinology

## 2024-01-23 VITALS — BP 118/82 | HR 66 | Resp 20 | Ht 69.0 in | Wt 186.2 lb

## 2024-01-23 DIAGNOSIS — E782 Mixed hyperlipidemia: Secondary | ICD-10-CM

## 2024-01-23 DIAGNOSIS — Z794 Long term (current) use of insulin: Secondary | ICD-10-CM

## 2024-01-23 DIAGNOSIS — E119 Type 2 diabetes mellitus without complications: Secondary | ICD-10-CM

## 2024-01-23 LAB — POCT GLYCOSYLATED HEMOGLOBIN (HGB A1C): Hemoglobin A1C: 6.2 % — AB (ref 4.0–5.6)

## 2024-01-23 MED ORDER — TIRZEPATIDE 12.5 MG/0.5ML ~~LOC~~ SOAJ: 12.5000 mg | SUBCUTANEOUS | 4 refills | Status: AC

## 2024-01-23 NOTE — Progress Notes (Signed)
 Outpatient Endocrinology Note [place] Terri Rorrer, MD   Patient's Name: Raymond Vasquez    DOB: [birth year]    MRN: [id number]                                                    REASON OF VISIT: Follow up for type 2 diabetes mellitus  PCP: Ozell Heron HERO, MD  HISTORY OF PRESENT ILLNESS:   Raymond Vasquez is a 73 y.o. old male with past medical history listed below, is here for follow up for type 2 diabetes mellitus.   Pertinent Diabetes History: Patient was previously seen by Dr. Von and was last time seen in June 2024.  Patient was diagnosed with type 2 diabetes mellitus in 2003.  Patient has controlled type 2 diabetes mellitus.  Chronic Diabetes Complications : Retinopathy: no. Last ophthalmology exam was done on 07/2022, annually, following with ophthalmology regularly.  Nephropathy: no Peripheral neuropathy: no Coronary artery disease: no Stroke: no  Relevant comorbidities and cardiovascular risk factors: Obesity: no Body mass index is 27.5 kg/m.  He had laparoscopic gastric banding surgery in 2011. Hypertension: no  Hyperlipidemia : Yes, on statin   Current / Home Diabetic regimen includes:  Mounjaro  10 mg weekly. Metformin  1000 mg 2 times a day. Amaryl  2 mg daily with supper.  Prior diabetic medications: Jardiance  was stopped in January 2025 due to recurrent yeast infection.  Glycemic data:   One Touch Verio glucometer, checking once a day in the morning fasting.  Average blood sugar in last 2 weeks August 19 to January 23, 2024 is 140.  Lowest blood sugar 89, highest blood sugar 201.  Some of the fasting blood sugar 127, 122, 184, 128, 201, 125, 92, 89.  Blood sugar in the afternoon 191, 164, 164.  Random mild hyperglycemia noted.  No hypoglycemia.    Hypoglycemia: Patient has no hypoglycemic episodes. Patient has hypoglycemia awareness.  Factors modifying glucose control: 1.  Diabetic diet assessment: 3 meals a day.  2.  Staying active or exercising:   3.   Medication compliance: compliant all of the time.  Interval history  Glucometer data as reviewed above.  Random mild hyperglycemia in the morning fasting.  He denies bedtime snack.  Hemoglobin A1c 6.2%.  Diabetes regimen as reviewed and noted above.  He is tolerating Mounjaro  well, currently taking 10 mg weekly.  Denies numbness and tingling of the feet.  No vision problem.  No other complaints today.  REVIEW OF SYSTEMS As per history of present illness.   PAST MEDICAL HISTORY: Past Medical History:  Diagnosis Date   Allergy    SEASONAL   Angioedema    he is unsure of this or cause   Arthritis    RIGHT shoulder   Balanitis 04/12/2013   Diabetes mellitus type II, controlled, with no complications (HCC)    on meds   Hemorrhoids    History of laparoscopic adjustable gastric banding, 08/10/2009 07/05/2013   Hyperlipidemia    on meds   Hypertension    Nephrolithiasis    1980s   OSA (obstructive sleep apnea)    PMR (polymyalgia rheumatica) (HCC)    Seizures (HCC)    03/2021   Shingles    2014    PAST SURGICAL HISTORY: Past Surgical History:  Procedure Laterality Date   APPENDECTOMY     COLONOSCOPY  2019   JMP-MAC-2 day mira/sup(good with lavage)-polyps   FINGER SURGERY  06/2012   LAPAROSCOPIC GASTRIC BANDING  08/10/2009   POLYPECTOMY  2019   polyps   ROTATOR CUFF REPAIR Right 06/2019   Dr Jane   TONSILLECTOMY  1959    ALLERGIES: No Known Allergies  FAMILY HISTORY:  Family History  Problem Relation Age of Onset   Heart disease Mother    Diabetes Maternal Grandmother    Colon polyps Neg Hx    Colon cancer Neg Hx    Esophageal cancer Neg Hx    Rectal cancer Neg Hx    Stomach cancer Neg Hx    Crohn's disease Neg Hx     SOCIAL HISTORY: Social History   Socioeconomic History   Marital status: Married    Spouse name: Not on file   Number of children: Not on file   Years of education: Not on file   Highest education level: Not on file  Occupational  History   Occupation: retired  Tobacco Use   Smoking status: Some Days    Types: Cigars    Passive exposure: Current (WIFE SMOKES)   Smokeless tobacco: Current    Types: Chew   Tobacco comments:    1 cigar occassionally, not more then 1x per week  Vaping Use   Vaping status: Never Used  Substance and Sexual Activity   Alcohol use: Yes    Comment: rarely   Drug use: No   Sexual activity: Not on file  Other Topics Concern   Not on file  Social History Narrative   Work or School: retired Therapist, 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[place] [employer]       Home Situation: lives with wifes      Spiritual Beliefs: none      Lifestyle: no regular exercise, healthy diet      Right handed    Social Drivers of Health   Financial Resource Strain: Low Risk  (03/24/2023)   Overall Financial Resource Strain (CARDIA)    Difficulty of Paying Living Expenses: Not hard at all  Food Insecurity: No Food Insecurity (03/24/2023)   Hunger Vital Sign    Worried About Running Out of Food in the Last Year: Never true    Ran Out of Food in the Last Year: Never true  Transportation Needs: No Transportation Needs (03/24/2023)   PRAPARE - Administrator, Civil Service (Medical): No    Lack of Transportation (Non-Medical): No  Physical Activity: Inactive (03/24/2023)   Exercise Vital Sign    Days of Exercise per Week: 0 days    Minutes of Exercise per Session: 0 min  Stress: No Stress Concern Present (03/24/2023)   [employer] of Occupational Health - Occupational Stress Questionnaire    Feeling of Stress : Not at all  Social Connections: Moderately Isolated (03/24/2023)   Social Connection and Isolation Panel    Frequency of Communication with Friends and Family: More than three times a week    Frequency of Social Gatherings with Friends and Family: More than three times a week    Attends Religious Services: Never    Database administrator or Organizations: No    Attends Engineer, structural:  Never    Marital Status: Married    MEDICATIONS:  Current Outpatient Medications  Medication Sig Dispense Refill   Cyanocobalamin  (VITAMIN B 12 PO) Take 1 tablet by mouth daily at 6 (six) AM.     Cyanocobalamin  (VITAMIN B-12 PO) Take by  mouth daily.     diclofenac (VOLTAREN) 75 MG EC tablet Take 75 mg by mouth daily.     folic acid  (FOLVITE ) 1 MG tablet Take 1 mg by mouth daily.     glimepiride  (AMARYL ) 4 MG tablet TAKE 1/2 TABLET BY MOUTH EVERY DAY WITH DINNER 45 tablet 3   glucose blood (ONETOUCH VERIO) test strip USE TO TEST BLOOD SUGAR DAILY, ALTERNATING FASTING AND 2 HOURS AFTER A MEAL. 50 strip 2   hydrOXYzine  (ATARAX ) 25 MG tablet TAKE 1-2 TABLETS AT BEDTIME AS NEEDED (INSOMNIA). 180 tablet 1   ketoconazole  (NIZORAL ) 2 % cream Apply 1 Application topically daily. 30 g 2   metFORMIN  (GLUCOPHAGE ) 1000 MG tablet Take 1 tablet (1,000 mg total) by mouth in the morning and at bedtime. 180 tablet 3   Methotrexate Sodium (METHOTREXATE, PF,) 50 MG/2ML injection INJECT 1 ML ONCE A WEEK     nystatin  cream (MYCOSTATIN ) Apply 1 application topically 2 (two) times daily. 30 g 0   ONETOUCH DELICA LANCETS FINE MISC Use as instructed by provider 200 each 3   rosuvastatin  (CRESTOR ) 20 MG tablet TAKE 1 TABLET BY MOUTH EVERY DAY 90 tablet 0   tirzepatide  (MOUNJARO ) 12.5 MG/0.5ML Pen Inject 12.5 mg into the skin once a week. 6 mL 4   No current facility-administered medications for this visit.    PHYSICAL EXAM: Vitals:   01/23/24 0929  BP: 118/82  Pulse: 66  Resp: 20  SpO2: 97%  Weight: 186 lb 3.2 oz (84.5 kg)  Height: 5' 9 (1.753 m)    Body mass index is 27.5 kg/m.  Wt Readings from Last 3 Encounters:  01/23/24 186 lb 3.2 oz (84.5 kg)  11/13/23 181 lb 11.2 oz (82.4 kg)  09/22/23 184 lb 9.6 oz (83.7 kg)    General: Well developed, well nourished male in no apparent distress.  HEENT: AT/[place], no external lesions.  Eyes: Conjunctiva clear and no icterus. Neck: Neck supple  Lungs:  Respirations not labored Neurologic: Alert, oriented, normal speech Extremities / Skin: Dry.  Psychiatric: Does not appear depressed or anxious  Diabetic Foot Exam - Simple   Simple Foot Form Diabetic Foot exam was performed with the following findings: Yes 01/23/2024  9:45 AM  Visual Inspection No deformities, no ulcerations, no other skin breakdown bilaterally: Yes Sensation Testing Intact to touch and monofilament testing bilaterally: Yes Pulse Check Posterior Tibialis and Dorsalis pulse intact bilaterally: Yes Comments     LABS Reviewed Lab Results  Component Value Date   HGBA1C 6.2 (A) 01/23/2024   HGBA1C 5.5 09/22/2023   HGBA1C 5.9 05/11/2023   Lab Results  Component Value Date   FRUCTOSAMINE 263 03/07/2019   FRUCTOSAMINE 281 03/22/2018   Lab Results  Component Value Date   CHOL 106 07/12/2022   HDL 34.50 (L) 07/12/2022   LDLCALC 39 07/12/2022   LDLDIRECT 154.1 02/16/2010   TRIG 165.0 (H) 07/12/2022   CHOLHDL 3 07/12/2022   Lab Results  Component Value Date   MICRALBCREAT 5.6 08/08/2013   MICRALBCREAT 15.1 06/11/2007   Lab Results  Component Value Date   CREATININE 0.80 05/03/2023   Lab Results  Component Value Date   GFR 89.58 10/19/2022    ASSESSMENT / PLAN  1. Type 2 diabetes mellitus without complication, with long-term current use of insulin (HCC)   2. Mixed hyperlipidemia     Diabetes Mellitus type 2, complicated by no known complications. - Diabetic status / severity: Controlled.  Lab Results  Component Value Date  HGBA1C 6.2 (A) 01/23/2024    - Hemoglobin A1c goal : < 6.5%  - Medications: See below.  No Change.  Increase Mounjaro  from 10 to 12.5 mg weekly.  Recently refilled for 3 months, okay to stay on 10 mg weekly until complete and increase to 12.5 mg weekly when refilled. Continue metformin  1000 mg 2 times a day. Continue Amaryl /glimepiride  2 mg daily.  - Home glucose testing: In the morning fasting and at bedtime  occasionally. - Discussed/ Gave Hypoglycemia treatment plan.  # Consult : not required at this time.   # Annual urine for microalbuminuria/ creatinine ratio, no microalbuminuria currently.  Will check urine microalbumin creatinine ratio along with BMP today. Last  Lab Results  Component Value Date   MICRALBCREAT 5.6 08/08/2013    # Foot check nightly.  # Annual dilated diabetic eye exams.   - Diet: Make healthy diabetic food choices - Life style / activity / exercise: Discussed.  2. Blood pressure  -  BP Readings from Last 1 Encounters:  01/23/24 118/82    - Control is in target.  - No change in current plans.  3. Lipid status / Hyperlipidemia - Last  Lab Results  Component Value Date   LDLCALC 39 07/12/2022   - Continue rosuvastatin  20 mg daily.  Managed by primary care provider. - Check lipid panel today with lab planned today.  Diagnoses and all orders for this visit:  Type 2 diabetes mellitus without complication, with long-term current use of insulin (HCC) -     POCT glycosylated hemoglobin (Hb A1C) -     tirzepatide  (MOUNJARO ) 12.5 MG/0.5ML Pen; Inject 12.5 mg into the skin once a week. -     Basic metabolic panel with GFR -     Microalbumin / creatinine urine ratio -     Lipid panel  Mixed hyperlipidemia -     Lipid panel      DISPOSITION Follow up in clinic in 4  months suggested.  Labs today as ordered.   All questions answered and patient verbalized understanding of the plan.  [place] Syesha Thaw, MD [facility] Endocrinology [facility] [address] [place], [address] [place], [place] [address] Phone # [phone]  At least part of this note was generated using voice recognition software. Inadvertent word errors may have occurred, which were not recognized during the proofreading process.

## 2024-01-24 LAB — LIPID PANEL
Cholesterol: 115 mg/dL (ref <200)
HDL: 44 mg/dL (ref >=40)
LDL Cholesterol (Calc): 53 mg/dL
Non-HDL Cholesterol (Calc): 71 mg/dL (ref <130)
Total CHOL/HDL Ratio: 2.6 (calc) (ref <5.0)
Triglycerides: 92 mg/dL (ref <150)

## 2024-01-24 LAB — BASIC METABOLIC PANEL WITH GFR
BUN: 14 mg/dL (ref 7–25)
CO2: 30 mmol/L (ref 20–32)
Calcium: 9.7 mg/dL (ref 8.6–10.3)
Chloride: 103 mmol/L (ref 98–110)
Creat: 0.82 mg/dL (ref 0.70–1.28)
Glucose, Bld: 148 mg/dL — ABNORMAL HIGH (ref 65–99)
Potassium: 4.4 mmol/L (ref 3.5–5.3)
Sodium: 139 mmol/L (ref 135–146)
eGFR: 94 mL/min/1.73m2 (ref >=60)

## 2024-01-24 LAB — MICROALBUMIN / CREATININE URINE RATIO
Creatinine, Urine: 116 mg/dL (ref 20–320)
Microalb Creat Ratio: 9 mg/g{creat} (ref <30)
Microalb, Ur: 1 mg/dL

## 2024-01-26 ENCOUNTER — Other Ambulatory Visit: Payer: Self-pay | Admitting: Family Medicine

## 2024-03-27 ENCOUNTER — Ambulatory Visit: Payer: Medicare Other

## 2024-03-27 ENCOUNTER — Telehealth: Payer: Self-pay

## 2024-03-27 VITALS — Ht 69.0 in | Wt 186.0 lb

## 2024-03-27 DIAGNOSIS — G4709 Other insomnia: Secondary | ICD-10-CM

## 2024-03-27 DIAGNOSIS — Z0000 Encounter for general adult medical examination without abnormal findings: Secondary | ICD-10-CM

## 2024-03-27 NOTE — Telephone Encounter (Signed)
 Patient seen for AWV and states that he stopped taking Hydroxyzine  because he didn't like the way that it made him feel the next morning.  Wife takes Trazodone and he would like to know if he can try medication as well.

## 2024-03-27 NOTE — Patient Instructions (Signed)
 Mr. Swoboda,  Thank you for taking the time for your Medicare Wellness Visit. I appreciate your continued commitment to your health goals. Please review the care plan we discussed, and feel free to reach out if I can assist you further.  Please note that Annual Wellness Visits do not include a physical exam. Some assessments may be limited, especially if the visit was conducted virtually. If needed, we may recommend an in-person follow-up with your provider.  Ongoing Care Seeing your primary care provider every 3 to 6 months helps us  monitor your health and provide consistent, personalized care.   Referrals If a referral was made during today's visit and you haven't received any updates within two weeks, please contact the referred provider directly to check on the status.  Recommended Screenings:  Health Maintenance  Topic Date Due   COVID-19 Vaccine (3 - Pfizer risk series) 09/27/2019   DTaP/Tdap/Td vaccine (2 - Td or Tdap) 07/13/2021   Eye exam for diabetics  08/18/2023   Flu Shot  12/22/2023   Hemoglobin A1C  07/22/2024   Yearly kidney function blood test for diabetes  01/22/2025   Yearly kidney health urinalysis for diabetes  01/22/2025   Complete foot exam   01/22/2025   Medicare Annual Wellness Visit  03/27/2025   Colon Cancer Screening  12/07/2026   Pneumococcal Vaccine for age over 59  Completed   Hepatitis C Screening  Completed   Meningitis B Vaccine  Aged Out   Zoster (Shingles) Vaccine  Discontinued       03/27/2024    8:52 AM  Advanced Directives  Does Patient Have a Medical Advance Directive? No  Would patient like information on creating a medical advance directive? Yes (MAU/Ambulatory/Procedural Areas - Information given)   Information on Advanced Care Planning can be found at [place]  Secretary of [facility] Advance Health Care Directives Advance Health Care Directives ([email or web address]   Vision: Annual vision screenings are recommended for early detection of  glaucoma, cataracts, and diabetic retinopathy. These exams can also reveal signs of chronic conditions such as diabetes and high blood pressure.  There are several Eye Doctors in your area. Here are a few that usually accept all insurance types:  [facility]) [address] [place], [place] [address] Phone: [phone]  [facility] [address] [place], [place] [address] Phone: [phone]  [facility] [address] [place], [place] [address] Phone: [phone]  [facility] [address] [place], [place] [address] Phone: [phone]  [facility] [address] [place], [place] [address] Phone: [phone]  [facility] [address] [place], [place] [address] Phone: [phone]   Dental: Annual dental screenings help detect early signs of oral cancer, gum disease, and other conditions linked to overall health, including heart disease and diabetes.  Please see the attached documents for additional preventive care recommendations.

## 2024-03-27 NOTE — Progress Notes (Addendum)
 Subjective:   Raymond Vasquez is a 73 y.o. male who presents for a Medicare Annual Wellness Visit.  Visit Complete: Virtual I connected with this patient by a audio enabled telemedicine application and verified that I am speaking with the correct person using two identifiers.  Patient Location: Home Provider Location: Home Office  I discussed the limitations of evaluation and management by telemedicine. The patient expressed understanding and agreed to proceed.  Persons Participating in Visit: Patient  Allergies (verified) Patient has no known allergies.   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 07/05/2013   Hyperlipidemia    on meds   Hypertension    Nephrolithiasis    1980s   OSA (obstructive sleep apnea)    PMR (polymyalgia rheumatica)    Seizures (HCC)    03/2021   Shingles    2014   Past Surgical History:  Procedure Laterality Date   APPENDECTOMY     COLONOSCOPY  2019   JMP-MAC-2 day mira/sup(good with lavage)-polyps   FINGER SURGERY  06/2012   LAPAROSCOPIC GASTRIC BANDING  08/10/2009   POLYPECTOMY  2019   polyps   ROTATOR CUFF REPAIR Right 06/2019   Dr Jane   TONSILLECTOMY  1959   Family History  Problem Relation Age of Onset   Heart disease Mother    Diabetes Maternal Grandmother    Colon polyps Neg Hx    Colon cancer Neg Hx    Esophageal cancer Neg Hx    Rectal cancer Neg Hx    Stomach cancer Neg Hx    Crohn's disease Neg Hx    Social History   Occupational History   Occupation: retired  Tobacco Use   Smoking status: Some Days    Types: Cigars    Passive exposure: Current (WIFE SMOKES)   Smokeless tobacco: Current    Types: Chew   Tobacco comments:     1 cigar occassionally, not more then 1x per week  Vaping Use   Vaping status: Never Used  Substance and Sexual Activity   Alcohol use: Yes    Comment: rarely   Drug use: No   Sexual activity: Not on file   Tobacco Counseling Ready to quit: Not Answered Counseling given: Not Answered Tobacco comments: 1 cigar occassionally, not more then 1x per week  SDOH Screenings   Food Insecurity: No Food Insecurity (03/27/2024)  Housing: Low Risk  (03/27/2024)  Transportation Needs: No Transportation Needs (03/27/2024)  Utilities: Not At Risk (03/27/2024)  Alcohol Screen: Low Risk  (03/24/2023)  Depression (PHQ2-9): Low Risk  (03/27/2024)  Financial Resource Strain: Low Risk  (03/24/2023)  Physical Activity: Inactive (03/27/2024)  Social Connections: Moderately Isolated (03/27/2024)  Stress: No Stress Concern Present (03/27/2024)  Tobacco Use: High Risk (03/27/2024)  Health Literacy: Adequate Health Literacy (03/27/2024)   Depression Screen    03/27/2024   10:21 AM 05/11/2023   11:14 AM 03/24/2023    9:02 AM 01/27/2023    1:22 PM 10/19/2022    9:27 AM 04/07/2022   10:30 AM 03/21/2022    8:58 AM  PHQ 2/9 Scores  PHQ - 2 Score 0 0 0 0 0 0 0  PHQ- 9 Score  4 0 0  2      Goals Addressed             This Visit's Progress    Maintain health and independence   On track      Visit info / Clinical Intake: Medicare Wellness Visit Type:: Subsequent Annual Wellness Visit Medicare Wellness Visit Mode:: Telephone If telephone:: video declined If telephone or video:: vitals recorded from last visit Interpreter Needed?: No Pre-visit prep was completed: yes AWV questionnaire completed by patient prior to visit?: no Living arrangements:: lives with spouse/significant other Patient's Overall Health Status Rating: very good Typical amount of pain: some Does pain affect daily life?: no Are you currently prescribed opioids?: no  Dietary Habits and Nutritional Risks How many meals a day?: 3 Eats  fruit and vegetables daily?: yes Most meals are obtained by: preparing own meals Diabetic:: (!) yes Any non-healing wounds?: no How often do you check your BS?: 1 Would you like to be referred to a Nutritionist or for Diabetic Management? : no  Functional Status Activities of Daily Living (to include ambulation/medication): Independent Ambulation: Independent Medication Administration: Independent Home Management: Independent Manage your own finances?: yes Primary transportation is: driving Concerns about vision?: no *vision screening is required for WTM* Concerns about hearing?: no  Fall Screening Falls in the past year?: 0 Number of falls in past year: 0 Was there an injury with Fall?: 0 Fall Risk Category Calculator: 0 Patient Fall Risk Level: Low Fall Risk  Fall Risk Patient at Risk for Falls Due to: No Fall Risks Fall risk Follow up: Education provided; Falls evaluation completed; Falls prevention discussed  Home and Transportation Safety: All rugs have non-skid backing?: yes All stairs or steps have railings?: yes Grab bars in the bathtub or shower?: yes Have non-skid surface in bathtub or shower?: yes Good home lighting?: yes Regular seat belt use?: yes Hospital stays in the last year:: no  Cognitive Assessment Difficulty concentrating, remembering, or making decisions? : no Will 6CIT or Mini Cog be Completed: no 6CIT or Mini Cog Declined: patient declined  Advance Directives (For Healthcare) Does Patient Have a Medical Advance Directive?: No Would patient like information on creating a medical advance directive?: Yes (MAU/Ambulatory/Procedural Areas - Information given)  Reviewed/Updated  Reviewed/Updated: All        Objective:    Today's Vitals   03/27/24 0845  Weight: 186 lb (84.4 kg)  Height: 5' 9 (1.753 m)   Body mass index is 27.47 kg/m.  Current Medications (verified) Outpatient Encounter Medications as of 03/27/2024  Medication Sig    Cyanocobalamin  (VITAMIN B 12 PO) Take 1 tablet by mouth daily at 6 (six) AM.   diclofenac (VOLTAREN) 75 MG EC tablet Take 75 mg by mouth daily.   folic acid  (FOLVITE ) 1 MG tablet Take 1 mg by mouth daily.   glimepiride  (AMARYL ) 4 MG tablet TAKE 1/2 TABLET BY MOUTH EVERY DAY WITH DINNER   glucose blood (ONETOUCH VERIO) test strip USE TO TEST BLOOD SUGAR DAILY, ALTERNATING FASTING AND 2 HOURS AFTER A MEAL.   ketoconazole  (NIZORAL ) 2 % cream Apply 1 Application topically daily.   metFORMIN  (GLUCOPHAGE ) 1000 MG tablet Take 1 tablet (1,000 mg total) by mouth in the morning and at bedtime.   Methotrexate Sodium (METHOTREXATE, PF,) 50 MG/2ML injection INJECT 1 ML ONCE A WEEK   nystatin  cream (MYCOSTATIN ) Apply 1 application topically 2 (two) times daily.   ONETOUCH DELICA LANCETS FINE MISC Use as instructed by  provider   rosuvastatin  (CRESTOR ) 20 MG tablet TAKE 1 TABLET BY MOUTH EVERY DAY   tirzepatide  (MOUNJARO ) 12.5 MG/0.5ML Pen Inject 12.5 mg into the skin once a week.   Cyanocobalamin  (VITAMIN B-12 PO) Take by mouth daily.   hydrOXYzine  (ATARAX ) 25 MG tablet TAKE 1-2 TABLETS AT BEDTIME AS NEEDED (INSOMNIA). (Patient not taking: Reported on 03/27/2024)   No facility-administered encounter medications on file as of 03/27/2024.   Hearing/Vision screen Hearing Screening - Comments:: Patient is able to hear conversational tones without difficulty. No issues reported.   Vision Screening - Comments:: No vision problems; will schedule routine eye exam soon   Immunizations and Health Maintenance Health Maintenance  Topic Date Due   COVID-19 Vaccine (3 - Pfizer risk series) 09/27/2019   DTaP/Tdap/Td (2 - Td or Tdap) 07/13/2021   OPHTHALMOLOGY EXAM  08/18/2023   Influenza Vaccine  12/22/2023   HEMOGLOBIN A1C  07/22/2024   Diabetic kidney evaluation - eGFR measurement  01/22/2025   Diabetic kidney evaluation - Urine ACR  01/22/2025   FOOT EXAM  01/22/2025   Medicare Annual Wellness (AWV)  03/27/2025    Colonoscopy  12/07/2026   Pneumococcal Vaccine: 50+ Years  Completed   Hepatitis C Screening  Completed   Meningococcal B Vaccine  Aged Out   Zoster Vaccines- Shingrix  Discontinued        Assessment/Plan:  This is a routine wellness examination for [place].  Patient Care Team: Ozell Heron HERO, MD as PCP - General (Family Medicine) Georjean Darice HERO, MD as Consulting Physician (Neurology) Thapa, [place], MD as Consulting Physician (Endocrinology) Harrie Stark, Deanna M, PA-C (Physician Assistant)  I have personally reviewed and noted the following in the patient's chart:   Medical and social history Use of alcohol, tobacco or illicit drugs  Current medications and supplements including opioid prescriptions. Functional ability and status Nutritional status Physical activity Advanced directives List of other physicians Hospitalizations, surgeries, and ER visits in previous 12 months Vitals Screenings to include cognitive, depression, and falls Referrals and appointments  No orders of the defined types were placed in this encounter.  In addition, I have reviewed and discussed with patient certain preventive protocols, quality metrics, and best practice recommendations. A written personalized care plan for preventive services as well as general preventive health recommendations were provided to patient.   Lavelle Charmaine Browner, LPN   [id number]   Return in 1 year (on 03/27/2025).  After Visit Summary: (MyChart) Due to this being a telephonic visit, the after visit summary with patients personalized plan was offered to patient via MyChart   Nurse Notes: See telephone note; patient is asking for alternative sleep aid.

## 2024-03-29 MED ORDER — TRAZODONE HCL 50 MG PO TABS: 25.0000 mg | ORAL_TABLET | Freq: Every evening | ORAL | 3 refills | Status: DC | PRN

## 2024-03-29 NOTE — Telephone Encounter (Signed)
 Patient informed of the message below and an appt was scheduled on 12/3.

## 2024-03-29 NOTE — Telephone Encounter (Signed)
 I sent in a script but it looks like he is supposed to see me in December for his annual visit and he does not have an appointment scheduled yet. Please contact patient and have him schedule his annual phsyical

## 2024-04-14 ENCOUNTER — Other Ambulatory Visit: Payer: Self-pay | Admitting: Endocrinology

## 2024-04-14 DIAGNOSIS — E119 Type 2 diabetes mellitus without complications: Secondary | ICD-10-CM

## 2024-04-15 DIAGNOSIS — Z6827 Body mass index (BMI) 27.0-27.9, adult: Secondary | ICD-10-CM | POA: Diagnosis not present

## 2024-04-15 DIAGNOSIS — M1991 Primary osteoarthritis, unspecified site: Secondary | ICD-10-CM | POA: Diagnosis not present

## 2024-04-15 DIAGNOSIS — I7300 Raynaud's syndrome without gangrene: Secondary | ICD-10-CM | POA: Diagnosis not present

## 2024-04-15 DIAGNOSIS — E663 Overweight: Secondary | ICD-10-CM | POA: Diagnosis not present

## 2024-04-15 DIAGNOSIS — G5792 Unspecified mononeuropathy of left lower limb: Secondary | ICD-10-CM | POA: Diagnosis not present

## 2024-04-15 DIAGNOSIS — M0600 Rheumatoid arthritis without rheumatoid factor, unspecified site: Secondary | ICD-10-CM | POA: Diagnosis not present

## 2024-04-15 DIAGNOSIS — Z79899 Other long term (current) drug therapy: Secondary | ICD-10-CM | POA: Diagnosis not present

## 2024-04-24 ENCOUNTER — Ambulatory Visit: Admitting: Family Medicine

## 2024-04-24 ENCOUNTER — Encounter: Payer: Self-pay | Admitting: Family Medicine

## 2024-04-24 VITALS — BP 110/78 | HR 67 | Temp 97.9°F | Ht 69.25 in | Wt 185.4 lb

## 2024-04-24 DIAGNOSIS — E1169 Type 2 diabetes mellitus with other specified complication: Secondary | ICD-10-CM

## 2024-04-24 DIAGNOSIS — M069 Rheumatoid arthritis, unspecified: Secondary | ICD-10-CM | POA: Diagnosis not present

## 2024-04-24 DIAGNOSIS — E785 Hyperlipidemia, unspecified: Secondary | ICD-10-CM

## 2024-04-24 DIAGNOSIS — Z0000 Encounter for general adult medical examination without abnormal findings: Secondary | ICD-10-CM

## 2024-04-24 MED ORDER — ROSUVASTATIN CALCIUM 20 MG PO TABS: 20.0000 mg | ORAL_TABLET | Freq: Every day | ORAL | 3 refills | Status: AC

## 2024-04-24 NOTE — Progress Notes (Signed)
 Complete physical exam  Patient: Raymond Vasquez   DOB: [birth year]   73 y.o. Male  MRN: [id number]  Subjective:    Chief Complaint  Patient presents with   Medicare Wellness    Raymond Vasquez is a 73 y.o. male who presents today for a complete physical exam. He reports consuming a general diet. Doesn't eat as much as used to, sometimes doesn't eat as much protein as he should. The patient does not participate in regular exercise at present. He generally feels well. He reports sleeping fairly well. He does not have additional problems to discuss today.    Most recent fall risk assessment:    04/24/2024    9:44 AM  Fall Risk   Falls in the past year? 0  Number falls in past yr: 0  Injury with Fall? 0  Risk for fall due to : No Fall Risks  Follow up Falls evaluation completed     Most recent depression screenings:    03/27/2024   10:21 AM 05/11/2023   11:14 AM  PHQ 2/9 Scores  PHQ - 2 Score 0 0  PHQ- 9 Score  4      Data saved with a previous flowsheet row definition    Vision:has appt with Eye doctor in [place] in March 2026. and Dental: No current dental problems and Receives regular dental care  Patient Active Problem List   Diagnosis Date Noted   Type 2 diabetes mellitus with diabetic neuropathy, without long-term current use of insulin (HCC) 05/18/2023   B12 deficiency 09/24/2020   RA (rheumatoid arthritis) (HCC) 07/24/2018   Hyperlipidemia associated with type 2 diabetes mellitus (HCC) 08/10/2017   Anal or rectal pain 08/10/2017   BMI 30.0-30.9,adult 08/04/2016   History of laparoscopic adjustable gastric banding, 08/10/2009 07/05/2013   Hyperlipidemia 11/20/2006   Hypertension associated with diabetes (HCC) 11/20/2006      Patient Care Team: Ozell Heron HERO, MD as PCP - General (Family Medicine) Georjean Darice HERO, MD as Consulting Physician (Neurology) Thapa, [place], MD as Consulting Physician (Endocrinology) Harrie Stark, Deanna M, PA-C (Physician  Assistant)   Outpatient Medications Prior to Visit  Medication Sig   Cyanocobalamin  (VITAMIN B 12 PO) Take 1 tablet by mouth daily at 6 (six) AM.   Cyanocobalamin  (VITAMIN B-12 PO) Take by mouth daily.   diclofenac (VOLTAREN) 75 MG EC tablet Take 75 mg by mouth daily.   folic acid  (FOLVITE ) 1 MG tablet Take 1 mg by mouth daily.   glimepiride  (AMARYL ) 4 MG tablet TAKE 1/2 TABLET BY MOUTH EVERY DAY WITH DINNER   ketoconazole  (NIZORAL ) 2 % cream Apply 1 Application topically daily.   metFORMIN  (GLUCOPHAGE ) 1000 MG tablet Take 1 tablet (1,000 mg total) by mouth in the morning and at bedtime.   Methotrexate Sodium (METHOTREXATE, PF,) 50 MG/2ML injection INJECT 1 ML ONCE A WEEK   nystatin  cream (MYCOSTATIN ) Apply 1 application topically 2 (two) times daily.   ONETOUCH DELICA LANCETS FINE MISC Use as instructed by provider   ONETOUCH VERIO test strip USE TO TEST BLOOD SUGAR DAILY, ALTERNATING FASTING AND 2 HOURS AFTER A MEAL.   rosuvastatin  (CRESTOR ) 20 MG tablet TAKE 1 TABLET BY MOUTH EVERY DAY   tirzepatide  (MOUNJARO ) 12.5 MG/0.5ML Pen Inject 12.5 mg into the skin once a week.   traZODone  (DESYREL ) 50 MG tablet Take 0.5-1 tablets (25-50 mg total) by mouth at bedtime as needed for sleep.   No facility-administered medications prior to visit.    Review of Systems  HENT:  Negative for hearing loss.   Eyes:  Negative for blurred vision.  Respiratory:  Negative for shortness of breath.   Cardiovascular:  Negative for chest pain.  Gastrointestinal: Negative.   Genitourinary: Negative.   Musculoskeletal:  Negative for back pain.  Neurological:  Negative for headaches.  Psychiatric/Behavioral:  Negative for depression.        Objective:     BP 110/78   Pulse 67   Temp 97.9 F (36.6 C) (Oral)   Ht 5' 9.25 (1.759 m)   Wt 185 lb 6.4 oz (84.1 kg)   SpO2 98%   BMI 27.18 kg/m    Physical Exam Vitals reviewed.  Constitutional:      Appearance: Normal appearance. He is well-groomed  and normal weight.  HENT:     Right Ear: Tympanic membrane and ear canal normal.     Left Ear: Tympanic membrane and ear canal normal.     Mouth/Throat:     Mouth: Mucous membranes are moist.     Pharynx: No posterior oropharyngeal erythema.  Eyes:     Extraocular Movements: Extraocular movements intact.     Conjunctiva/sclera: Conjunctivae normal.  Neck:     Thyroid : No thyromegaly.  Cardiovascular:     Rate and Rhythm: Normal rate and regular rhythm.     Heart sounds: S1 normal and S2 normal. No murmur heard. Pulmonary:     Effort: Pulmonary effort is normal.     Breath sounds: Normal breath sounds and air entry. No rales.  Abdominal:     General: Abdomen is flat. Bowel sounds are normal.  Musculoskeletal:     Right lower leg: No edema.     Left lower leg: No edema.  Lymphadenopathy:     Cervical: No cervical adenopathy.  Neurological:     General: No focal deficit present.     Mental Status: He is alert and oriented to person, place, and time.     Gait: Gait is intact.  Psychiatric:        Mood and Affect: Mood and affect normal.      No results found for any visits on 04/24/24.     Assessment & Plan:    Routine Health Maintenance and Physical Exam  Immunization History  Administered Date(s) Administered   Fluad Quad(high Dose 65+) 04/03/2019, 01/31/2020, 03/10/2021, 04/07/2022   Fluad Trivalent(High Dose 65+) 05/11/2023   INFLUENZA, HIGH DOSE SEASONAL PF 06/05/2018   Influenza Whole 02/20/2009, 02/21/2011   Influenza,inj,quad, With Preservative 02/20/2021   Influenza-Unspecified 01/22/2016, 02/20/2017, 03/28/2024   PFIZER(Purple Top)SARS-COV-2 Vaccination 07/30/2019, 08/30/2019   PNEUMOCOCCAL CONJUGATE-20 03/10/2021   Pneumococcal Conjugate-13 08/14/2013, 03/21/2014   Pneumococcal Polysaccharide-23 11/20/2014   Tdap 07/14/2011   Zoster Recombinant(Shingrix) 03/06/2019   Zoster, Live 08/14/2013    Health Maintenance  Topic Date Due   COVID-19 Vaccine (3  - Pfizer risk series) 09/27/2019   DTaP/Tdap/Td (2 - Td or Tdap) 07/13/2021   OPHTHALMOLOGY EXAM  08/18/2023   HEMOGLOBIN A1C  07/22/2024   Diabetic kidney evaluation - eGFR measurement  01/22/2025   Diabetic kidney evaluation - Urine ACR  01/22/2025   FOOT EXAM  01/22/2025   Medicare Annual Wellness (AWV)  03/27/2025   Colonoscopy  12/07/2026   Pneumococcal Vaccine: 50+ Years  Completed   Influenza Vaccine  Completed   Hepatitis C Screening  Completed   Meningococcal B Vaccine  Aged Out   Zoster Vaccines- Shingrix  Discontinued    Discussed health benefits of physical activity, and encouraged him to  engage in regular exercise appropriate for his age and condition.  Routine adult health maintenance  Rheumatoid arthritis involving multiple sites, unspecified whether rheumatoid factor present [facility]) Assessment & Plan: Is managed by [facility]   Hyperlipidemia associated with type 2 diabetes mellitus (HCC)  General physical exam findings are normal today. I reviewed the patient's preventative testing, immunizations, and lifestyle habits. I made appropriate recommendations and placed orders for the appropriate tests and/or vaccinations. I counseled the patient on the CDC's recommendations for healthy exercise and diet. I counseled the patient on healthy sleep habits and stress management. Handouts to reinforce the counseling were given at the conclusion of the visit.    Return in about 6 months (around 10/23/2024).     Heron CHRISTELLA Sharper, MD

## 2024-04-24 NOTE — Assessment & Plan Note (Signed)
 Is managed by [facility]

## 2024-04-24 NOTE — Patient Instructions (Signed)
 Premier Protein shakes Zone shakes Fairlife protein shakes

## 2024-04-30 ENCOUNTER — Other Ambulatory Visit: Payer: Self-pay | Admitting: Family Medicine

## 2024-04-30 DIAGNOSIS — G4709 Other insomnia: Secondary | ICD-10-CM

## 2024-05-24 ENCOUNTER — Ambulatory Visit: Admitting: Endocrinology

## 2024-06-09 ENCOUNTER — Other Ambulatory Visit: Payer: Self-pay | Admitting: Family Medicine

## 2024-06-09 DIAGNOSIS — G4709 Other insomnia: Secondary | ICD-10-CM

## 2024-07-09 ENCOUNTER — Ambulatory Visit: Admitting: Endocrinology

## 2024-10-23 ENCOUNTER — Ambulatory Visit: Admitting: Family Medicine
